# Patient Record
Sex: Male | Born: 1990 | Race: Black or African American | Hispanic: No | Smoking: Current every day smoker
Health system: Southern US, Community
[De-identification: ages and names within clinical notes are randomized; demographics above are authoritative.]

## PROBLEM LIST (undated history)

## (undated) DIAGNOSIS — J45909 Unspecified asthma, uncomplicated: Secondary | ICD-10-CM

## (undated) DIAGNOSIS — F419 Anxiety disorder, unspecified: Secondary | ICD-10-CM

## (undated) DIAGNOSIS — F1910 Other psychoactive substance abuse, uncomplicated: Secondary | ICD-10-CM

## (undated) DIAGNOSIS — R569 Unspecified convulsions: Secondary | ICD-10-CM

---

## 2017-12-29 DIAGNOSIS — Z72820 Sleep deprivation: Secondary | ICD-10-CM | POA: Insufficient documentation

## 2017-12-29 DIAGNOSIS — S299XXA Unspecified injury of thorax, initial encounter: Secondary | ICD-10-CM | POA: Insufficient documentation

## 2017-12-29 DIAGNOSIS — R569 Unspecified convulsions: Secondary | ICD-10-CM | POA: Insufficient documentation

## 2017-12-29 DIAGNOSIS — Z9114 Patient's other noncompliance with medication regimen: Secondary | ICD-10-CM | POA: Insufficient documentation

## 2017-12-29 DIAGNOSIS — S161XXA Strain of muscle, fascia and tendon at neck level, initial encounter: Secondary | ICD-10-CM | POA: Insufficient documentation

## 2017-12-29 DIAGNOSIS — R51 Headache: Secondary | ICD-10-CM | POA: Insufficient documentation

## 2017-12-29 DIAGNOSIS — F419 Anxiety disorder, unspecified: Secondary | ICD-10-CM | POA: Insufficient documentation

## 2017-12-29 DIAGNOSIS — Y929 Unspecified place or not applicable: Secondary | ICD-10-CM | POA: Insufficient documentation

## 2017-12-29 DIAGNOSIS — Z79899 Other long term (current) drug therapy: Secondary | ICD-10-CM | POA: Insufficient documentation

## 2017-12-29 DIAGNOSIS — Y9389 Activity, other specified: Secondary | ICD-10-CM | POA: Insufficient documentation

## 2017-12-29 DIAGNOSIS — W109XXA Fall (on) (from) unspecified stairs and steps, initial encounter: Secondary | ICD-10-CM | POA: Insufficient documentation

## 2017-12-29 DIAGNOSIS — Y999 Unspecified external cause status: Secondary | ICD-10-CM | POA: Insufficient documentation

## 2017-12-30 ENCOUNTER — Other Ambulatory Visit: Payer: Self-pay

## 2017-12-30 ENCOUNTER — Emergency Department (HOSPITAL_COMMUNITY): Payer: Self-pay

## 2017-12-30 ENCOUNTER — Encounter (HOSPITAL_COMMUNITY): Payer: Self-pay | Admitting: Emergency Medicine

## 2017-12-30 ENCOUNTER — Emergency Department (HOSPITAL_COMMUNITY)
Admission: EM | Admit: 2017-12-30 | Discharge: 2017-12-30 | Disposition: A | Discharge status: Home / Self Care | Payer: Self-pay | Attending: Emergency Medicine | Admitting: Emergency Medicine

## 2017-12-30 DIAGNOSIS — R569 Unspecified convulsions: Secondary | ICD-10-CM

## 2017-12-30 DIAGNOSIS — S161XXA Strain of muscle, fascia and tendon at neck level, initial encounter: Secondary | ICD-10-CM

## 2017-12-30 DIAGNOSIS — S298XXA Other specified injuries of thorax, initial encounter: Secondary | ICD-10-CM

## 2017-12-30 HISTORY — DX: Unspecified convulsions: R56.9

## 2017-12-30 LAB — CBC WITH DIFFERENTIAL/PLATELET
Basophils Absolute: 0 10*3/uL (ref 0.0–0.1)
Basophils Relative: 0 %
Eosinophils Absolute: 0.2 10*3/uL (ref 0.0–0.7)
Eosinophils Relative: 2 %
HEMATOCRIT: 38.1 % — AB (ref 39.0–52.0)
Hemoglobin: 12.8 g/dL — ABNORMAL LOW (ref 13.0–17.0)
LYMPHS ABS: 4.1 10*3/uL — AB (ref 0.7–4.0)
LYMPHS PCT: 38 %
MCH: 29.9 pg (ref 26.0–34.0)
MCHC: 33.6 g/dL (ref 30.0–36.0)
MCV: 89 fL (ref 78.0–100.0)
MONOS PCT: 7 %
Monocytes Absolute: 0.8 10*3/uL (ref 0.1–1.0)
NEUTROS ABS: 5.5 10*3/uL (ref 1.7–7.7)
Neutrophils Relative %: 53 %
Platelets: 249 10*3/uL (ref 150–400)
RBC: 4.28 MIL/uL (ref 4.22–5.81)
RDW: 13.6 % (ref 11.5–15.5)
WBC: 10.7 10*3/uL — ABNORMAL HIGH (ref 4.0–10.5)

## 2017-12-30 LAB — COMPREHENSIVE METABOLIC PANEL
ALT: 34 U/L (ref 17–63)
ANION GAP: 9 (ref 5–15)
AST: 29 U/L (ref 15–41)
Albumin: 4.1 g/dL (ref 3.5–5.0)
Alkaline Phosphatase: 65 U/L (ref 38–126)
BUN: 15 mg/dL (ref 6–20)
CO2: 26 mmol/L (ref 22–32)
Calcium: 9.7 mg/dL (ref 8.9–10.3)
Chloride: 102 mmol/L (ref 101–111)
Creatinine, Ser: 1.23 mg/dL (ref 0.61–1.24)
GFR calc Af Amer: >60 mL/min (ref >60)
GFR calc non Af Amer: >60 mL/min (ref >60)
GLUCOSE: 98 mg/dL (ref 65–99)
POTASSIUM: 3.8 mmol/L (ref 3.5–5.1)
SODIUM: 137 mmol/L (ref 135–145)
Total Bilirubin: 0.4 mg/dL (ref 0.3–1.2)
Total Protein: 7.1 g/dL (ref 6.5–8.1)

## 2017-12-30 LAB — ETHANOL: Alcohol, Ethyl (B): <10 mg/dL (ref <10)

## 2017-12-30 IMAGING — CR DG CHEST 2V
2 series · 2 of 2 positions shown · non-contrast
Comparison: None.

CLINICAL DATA: Acute onset of seizures.

EXAM:
CHEST - 2 VIEW

[w chest pa]
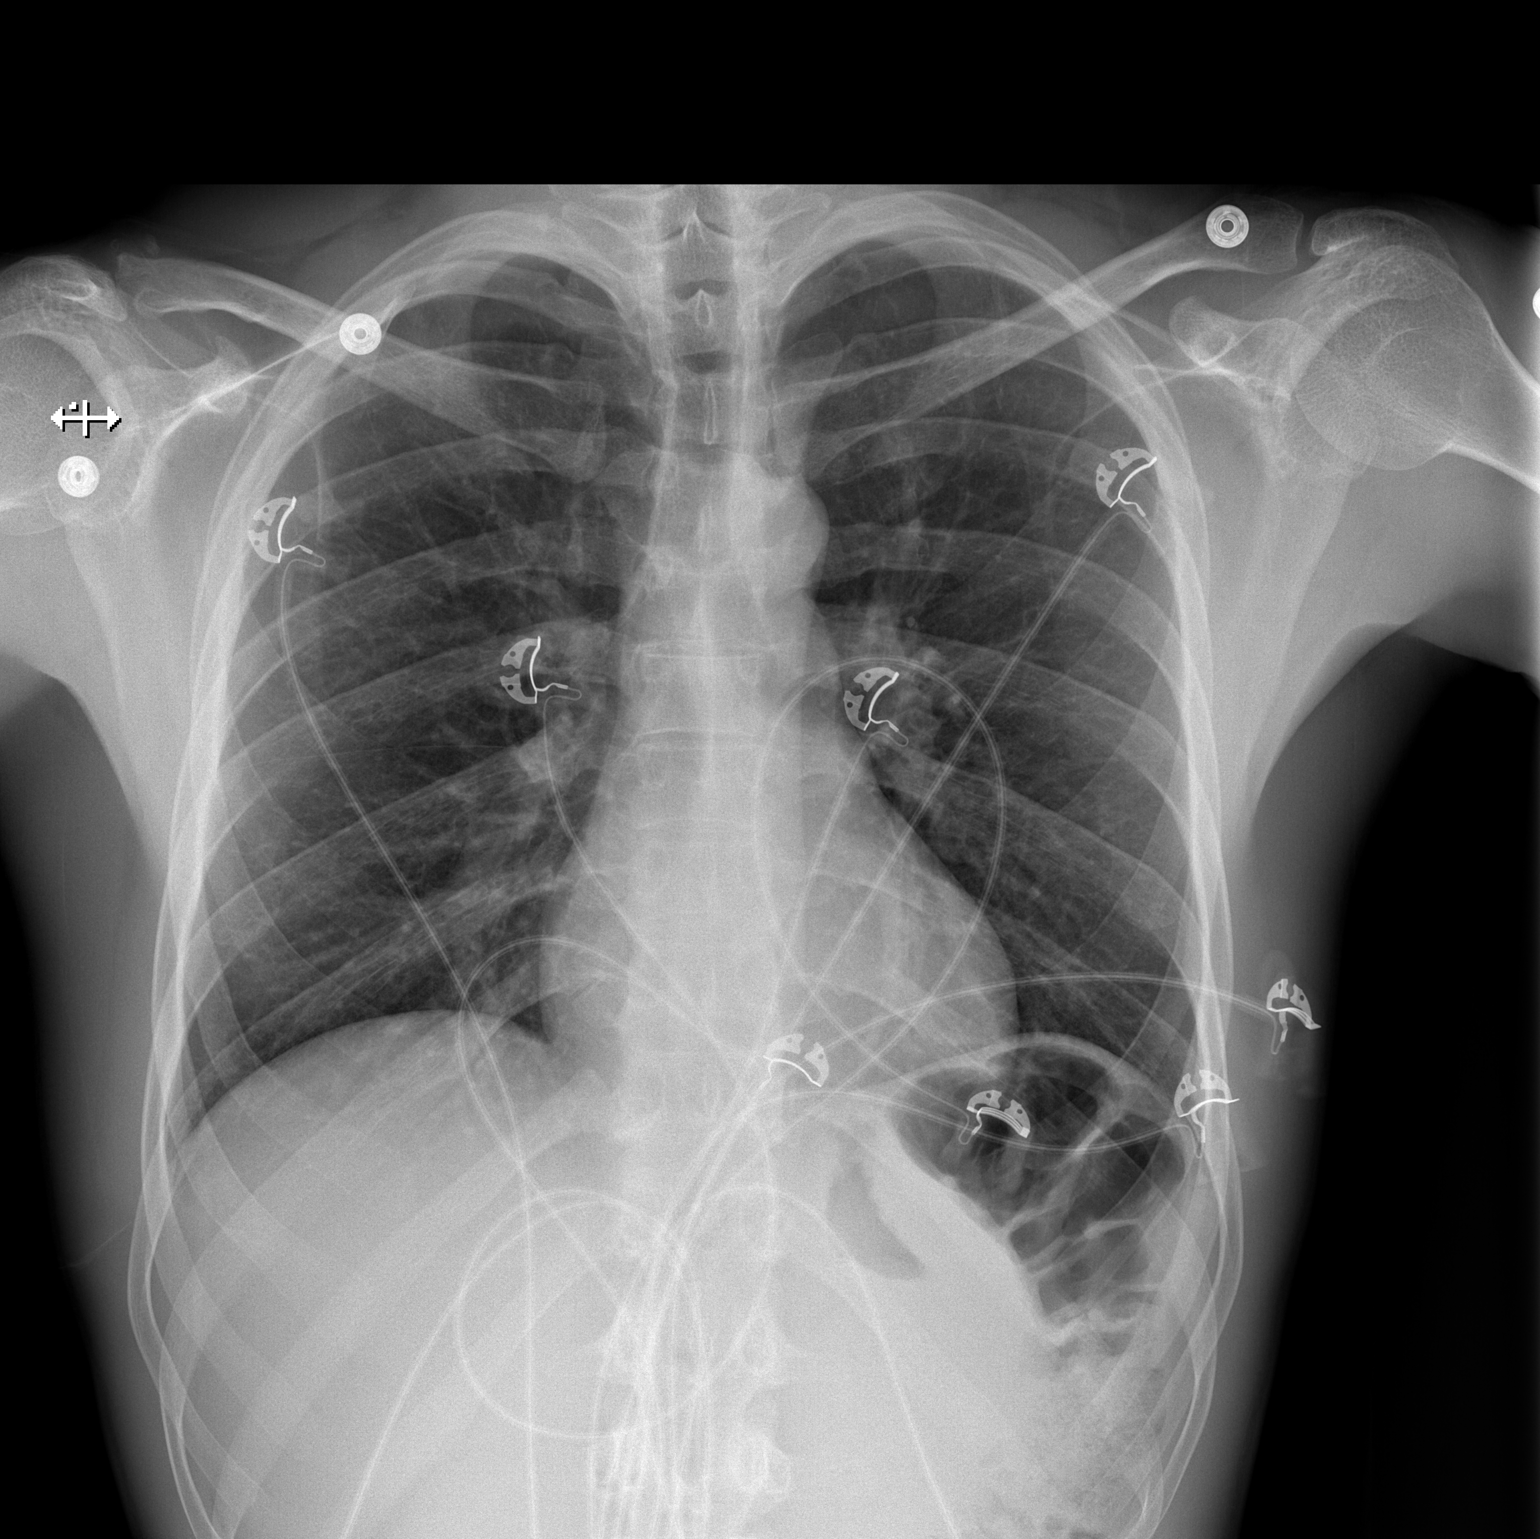

[w chest lat]
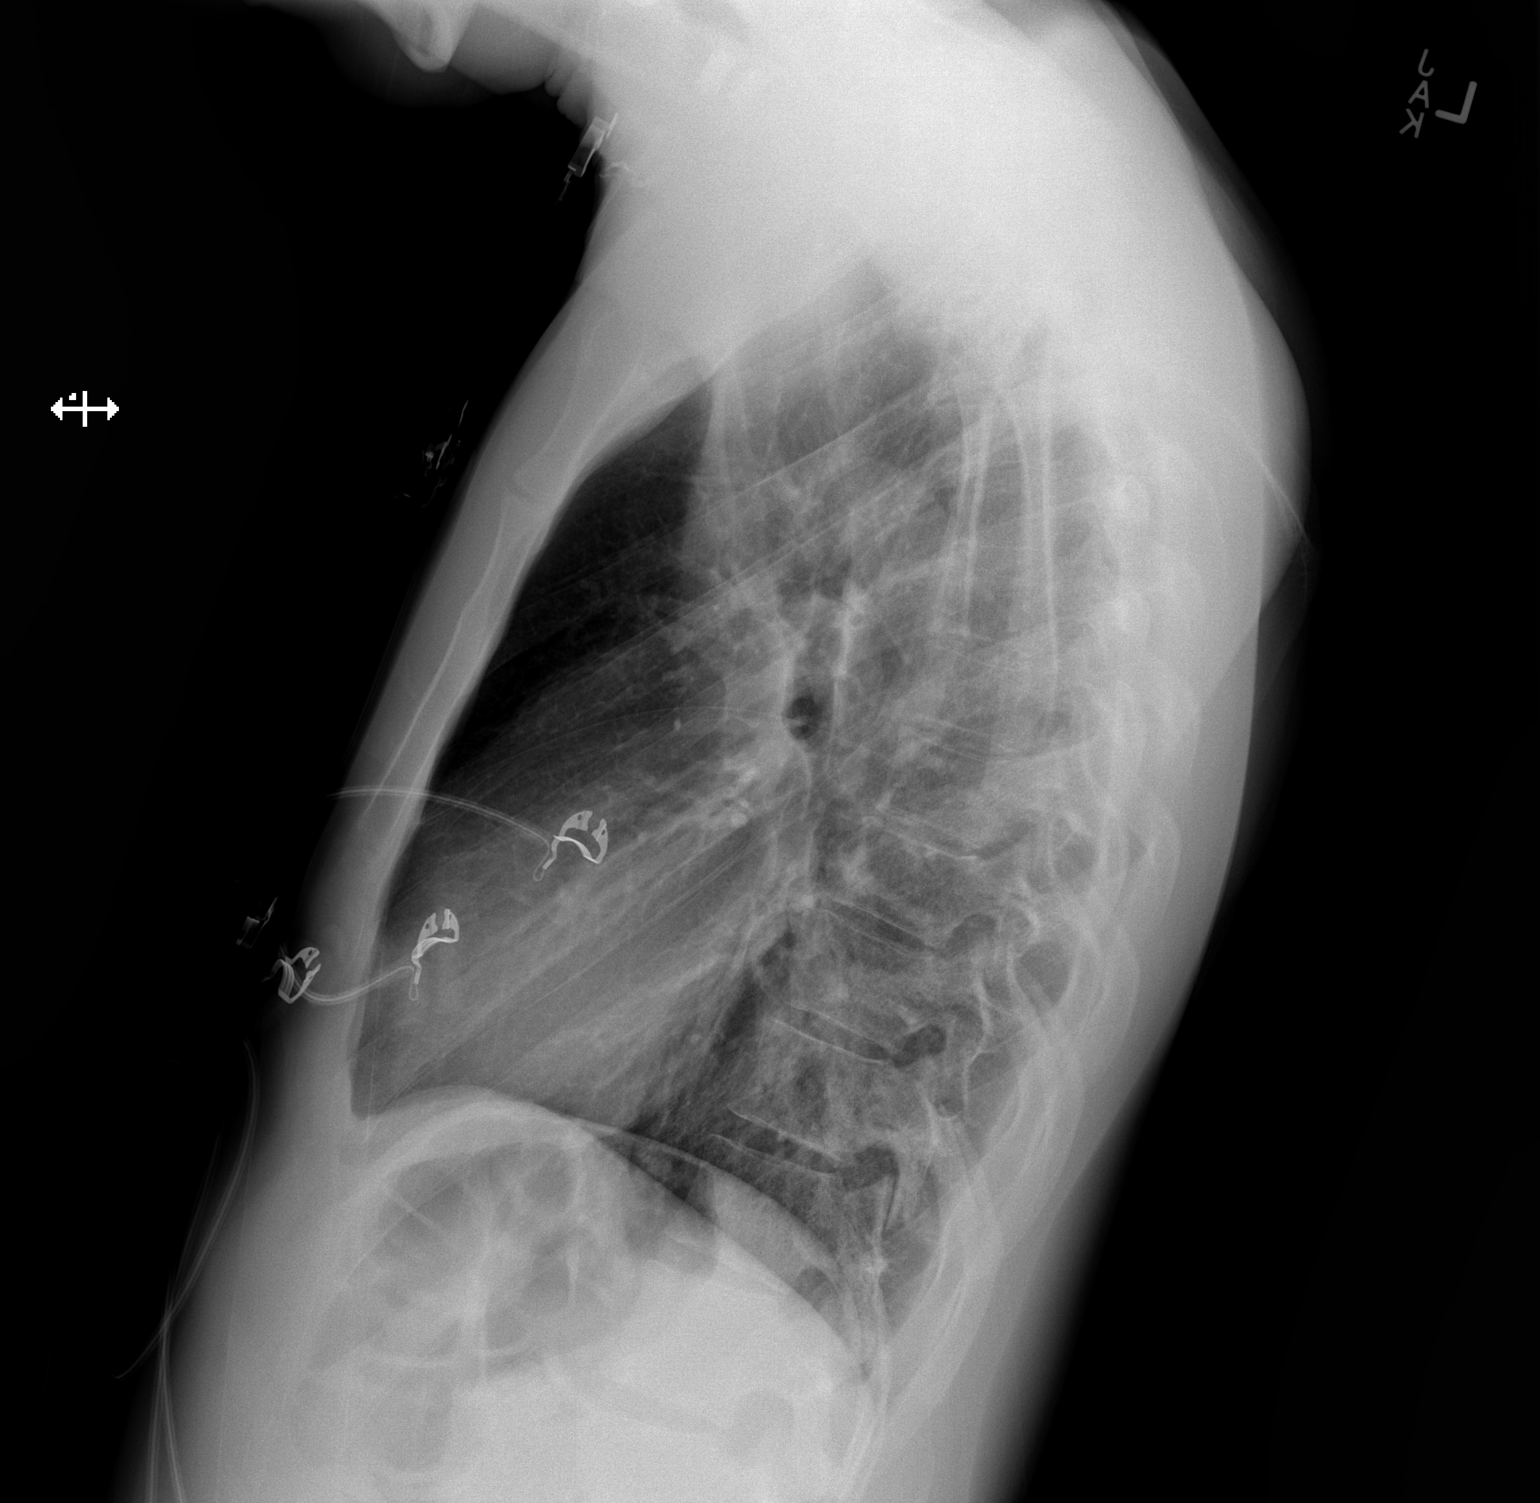

[2 of 2 positions shown; findings below may reference images not displayed]

FINDINGS: The lungs are well-aerated and clear. There is no evidence of focal
opacification, pleural effusion or pneumothorax.

The heart is normal in size; the mediastinal contour is within
normal limits. No acute osseous abnormalities are seen.
IMPRESSION: No acute cardiopulmonary process seen.

## 2017-12-30 IMAGING — CR DG CERVICAL SPINE COMPLETE 4+V
5 series · 5 of 5 positions shown · non-contrast
Comparison: None.

CLINICAL DATA: Status post fall down flight of stairs, with
seizure. Neck pain. Initial encounter.

EXAM:
CERVICAL SPINE - COMPLETE 4+ VIEW

[w cervical spine lat]
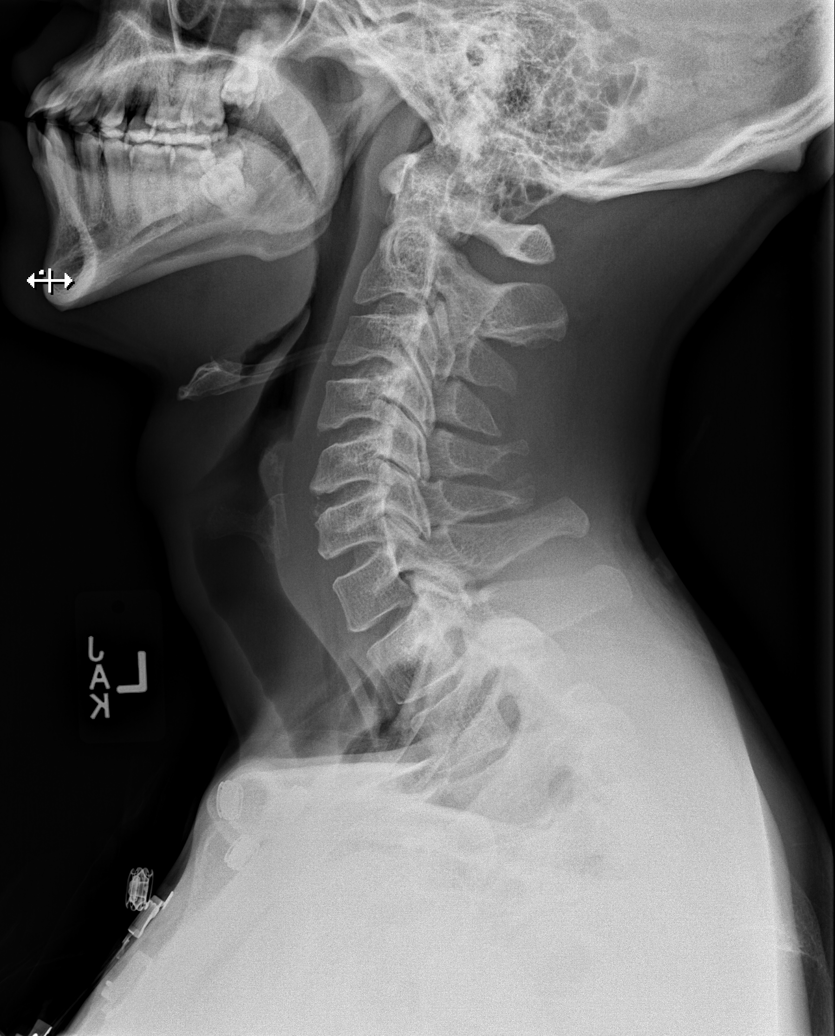

[w cervical spine ap_obl (1 of 2)]
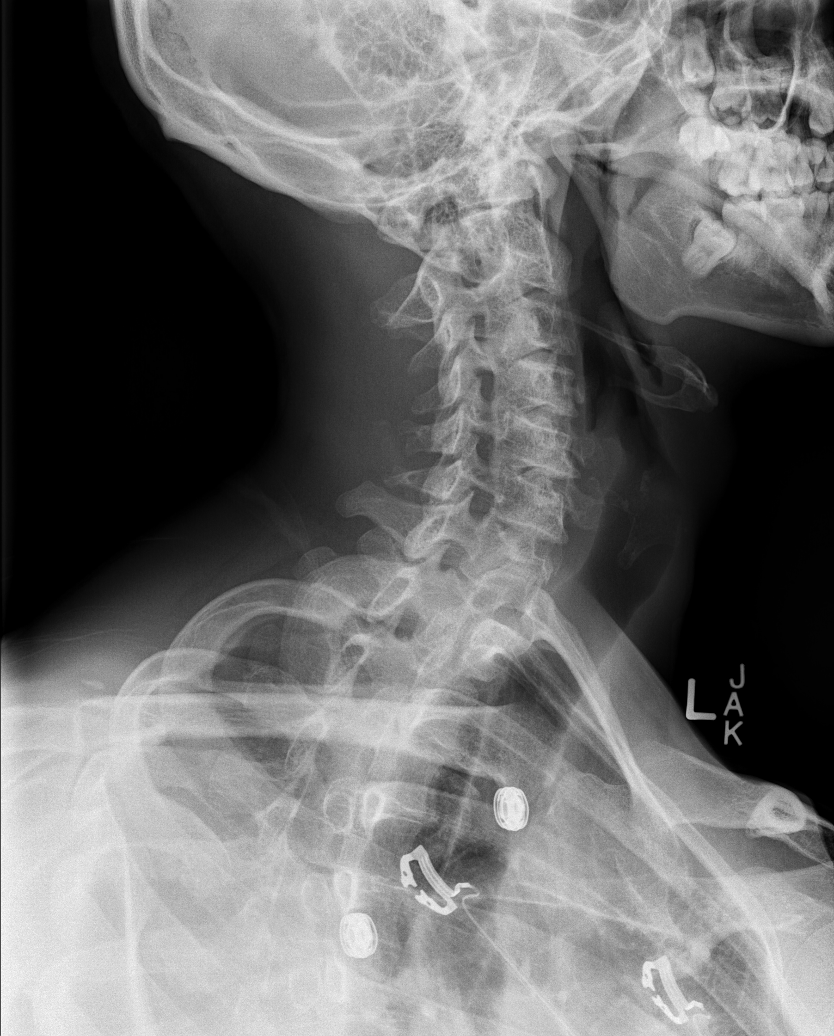

[w cervical spine ap_obl (2 of 2)]
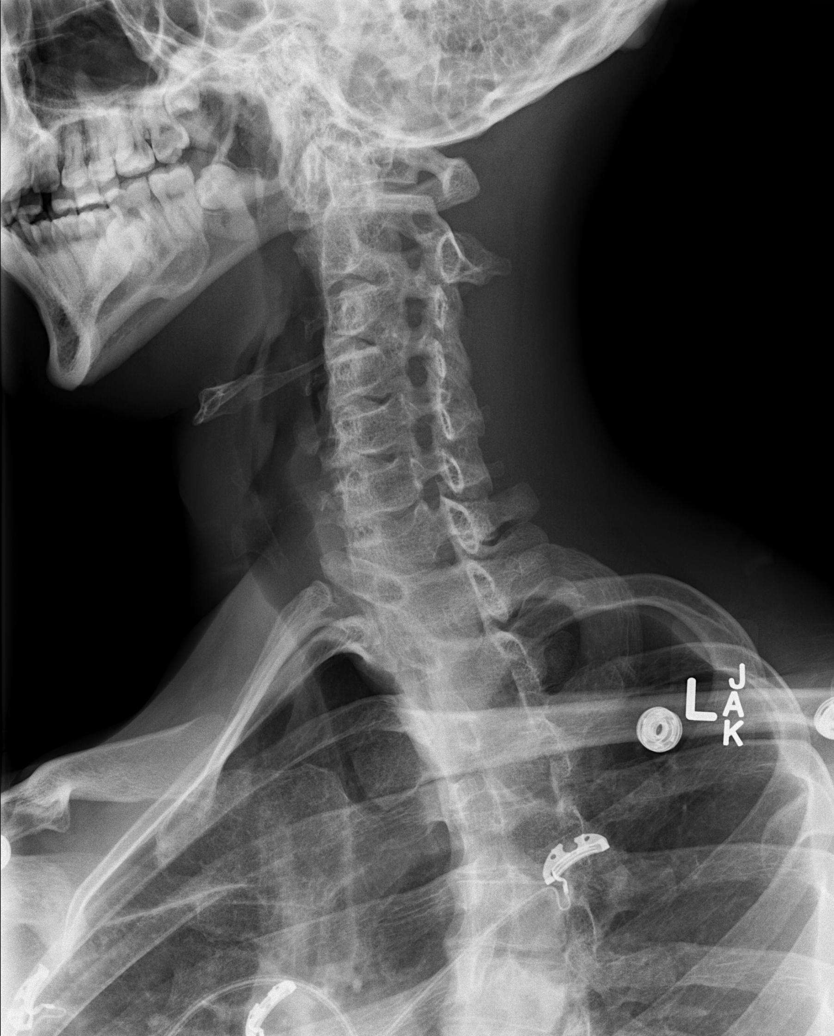

[w cervical spine ap]
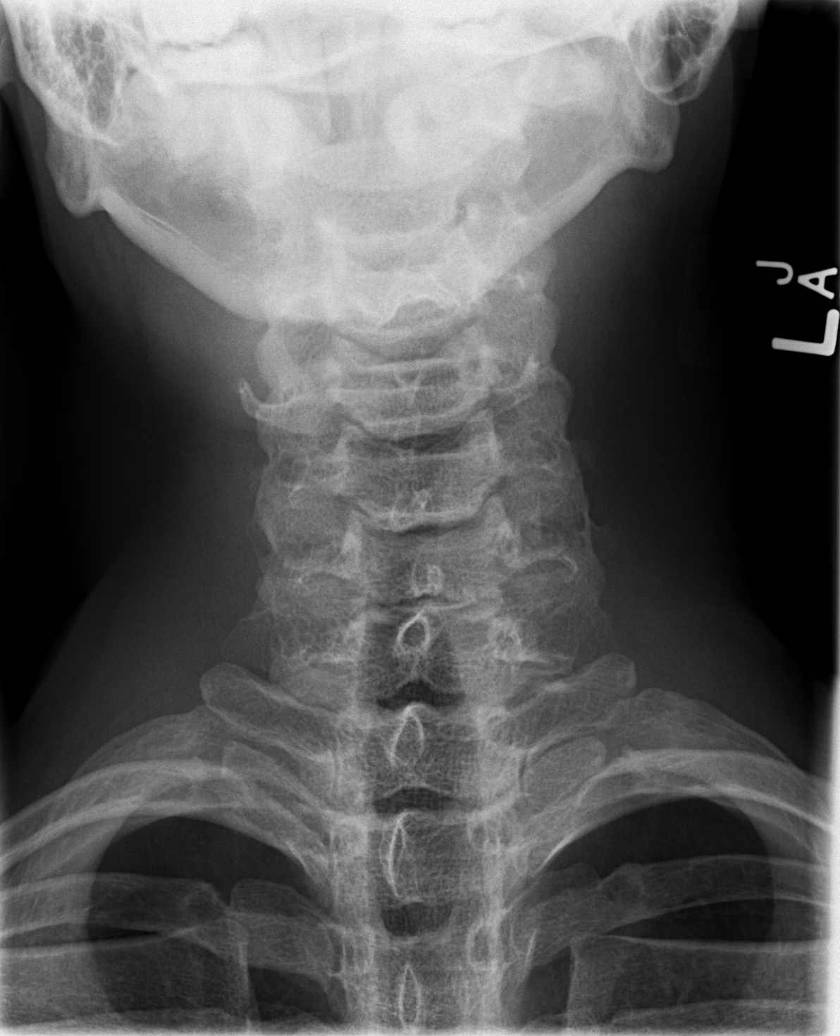

[w cervical spine odontoid]
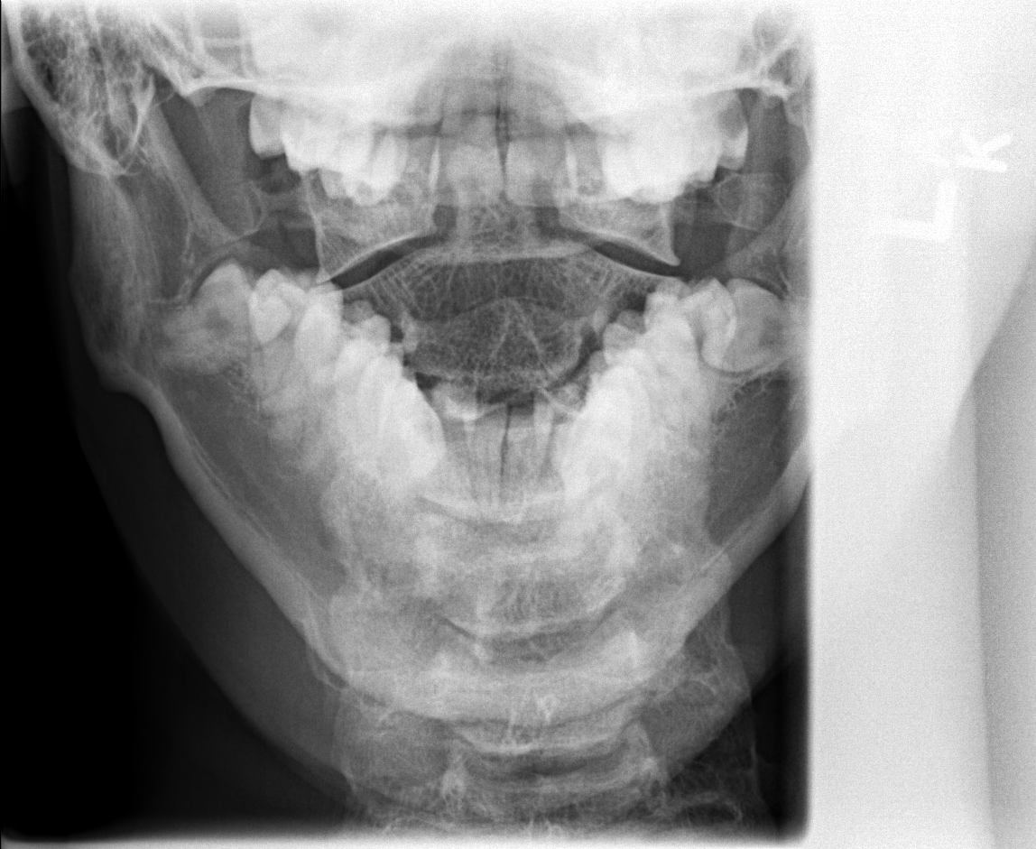

[5 of 5 positions shown; findings below may reference images not displayed]

FINDINGS: There is no evidence of fracture or subluxation. Vertebral bodies
demonstrate normal height and alignment. Intervertebral disc spaces
are preserved. Prevertebral soft tissues are within normal limits.
The provided odontoid view demonstrates no significant abnormality.

The visualized lung apices are clear.
IMPRESSION: No evidence of fracture or subluxation along the cervical spine.

## 2017-12-30 MED ORDER — ACETAMINOPHEN 325 MG PO TABS
650.0000 mg | ORAL_TABLET | Freq: Once | ORAL | Status: AC
  Administered 2017-12-30: 650 mg via ORAL
  Filled 2017-12-30: qty 2

## 2017-12-30 NOTE — ED Triage Notes (Signed)
Patient was brought by his pastor. Patient is very lethargic and is stuttering. Patient was having seizures. Patient has a hx of seizures but has not taken medication in a while. Patient has had no money to get any medication. Patient has not been eating or sleeping well.

## 2017-12-30 NOTE — ED Notes (Signed)
Pt made an unsuccessful attempt to provide urine specimen. 

## 2017-12-30 NOTE — Discharge Instructions (Signed)
Please be aware you may have another seizure ° °Do not drive until seen by your physician for your condition ° °Do not climb ladders/roofs/trees as a seizure can occur at that height and cause serious harm ° °Do not bathe/swim alone as a seizure can occur and cause serious harm ° °Please followup with your physician or neurologist for further testing and possible treatment ° ° °

## 2017-12-30 NOTE — ED Provider Notes (Signed)
[facility] Provider Note   CSN: [id number] Arrival date & time: 12/29/17  2340     History   Chief Complaint Chief Complaint  Patient presents with  . Seizures    HPI [facility] is a 27 y.o. male.  The history is provided by the patient.  Seizures   This is a recurrent problem. There were 2 to 3 seizures. Associated symptoms include confusion and headaches. The seizure(s) had no focality. There has been no fever.   Patient with known history of seizures presents today with 2 seizures in the past 12  hours. Patient reports that bystanders told him he had a seizure and fell downstairs.  He reports that time is been having some headache/neck pain and chest wall pain.  He had another seizure later on that resolved spontaneously.  He reports he supposed to be taking Keppra, but has not taken it in a while.  He is followed up with neurology in [place] [facility] previously  He reports he is under a lot of stress with multiple jobs, and his mother is having health problems.  He reports lack of sleep. Denies any drug or alcohol abuse Past Medical History:  Diagnosis Date  . Seizures (HCC)     There are no active problems to display for this patient.   History reviewed. No pertinent surgical history.      Home Medications    Prior to Admission medications   Medication Sig Start Date End Date Taking? Authorizing Provider  clonazePAM (KLONOPIN PO) Take by mouth.   Yes [provider]    Family History History reviewed. No pertinent family history.  Social History Social History   Tobacco Use  . Smoking status: Never Smoker  . Smokeless tobacco: Never Used  Substance Use Topics  . Alcohol use: Never    Frequency: Never  . Drug use: Never     Allergies   Ibuprofen and Other   Review of Systems Review of Systems  Constitutional: Negative for fever.  Musculoskeletal: Positive for neck pain.  Neurological: Positive for  seizures and headaches.  Psychiatric/Behavioral: Positive for confusion and sleep disturbance. The patient is nervous/anxious.   All other systems reviewed and are negative.    Physical Exam Updated Vital Signs BP 120/87 (BP Location: Right Arm)   Pulse 87   Temp 98.3 F (36.8 C) (Oral)   Resp (!) 29   Ht 1.778 m (5\' 10" )   Wt 68 kg (150 lb)   SpO2 100%   BMI 21.52 kg/m   Physical Exam CONSTITUTIONAL: Well developed/well nourished, anxious HEAD: Normocephalic/atraumatic EYES: EOMI/PERRL ENMT: Mucous membranes moist no signs of trauma NECK: supple no meningeal signs SPINE/BACK: Diffuse cervical spine tenderness, no bruising/crepitance/stepoffs noted to spine No bruising to T or L-spine CV: S1/S2 noted, no murmurs/rubs/gallops noted Chest-diffuse chest wall tenderness, no crepitus LUNGS: Lungs are clear to auscultation bilaterally, no apparent distress ABDOMEN: soft, nontender, no rebound or guarding, bowel sounds noted throughout abdomen GU:no cva tenderness NEURO: Pt is awake/alert/appropriate, moves all extremitiesx4.  No facial droop.  GCS is 15.  Patient is ambulatory.  No focal weakness noted EXTREMITIES: pulses normal/equal, full ROM,All other extremities/joints palpated/ranged and nontender SKIN: warm, color normal PSYCH: Anxious  ED Treatments / Results  Labs (all labs ordered are listed, but only abnormal results are displayed) Labs Reviewed  CBC WITH DIFFERENTIAL/PLATELET - Abnormal; Notable for the following components:      Result Value   WBC 10.7 (*)  Hemoglobin 12.8 (*)    HCT 38.1 (*)    Lymphs Abs 4.1 (*)    All other components within normal limits  COMPREHENSIVE METABOLIC PANEL  ETHANOL    EKG EKG Interpretation  Date/Time:  Sunday December 30 2017 02:03:14 EDT Ventricular Rate:  75 PR Interval:    QRS Duration: 85 QT Interval:  377 QTC Calculation: 421 R Axis:   -81 Text Interpretation:  Sinus rhythm LAE, consider biatrial enlargement Left  axis deviation RSR' in V1 or V2, probably normal variant ST elev, probable normal early repol pattern No previous ECGs available Abnormal ekg Confirmed by Zadie RhineWickline, Ulonda Klosowski ([id number]) on 12/30/2017 2:13:52 AM   Radiology Dg Chest 2 View  Result Date: 12/30/2017 CLINICAL DATA:  Acute onset of seizures. EXAM: CHEST - 2 VIEW COMPARISON:  None. FINDINGS: The lungs are well-aerated and clear. There is no evidence of focal opacification, pleural effusion or pneumothorax. The heart is normal in size; the mediastinal contour is within normal limits. No acute osseous abnormalities are seen. IMPRESSION: No acute cardiopulmonary process seen. Electronically Signed   By: Roanna RaiderJeffery  Chang M.D.   On: 12/30/2017 04:01   Dg Cervical Spine Complete  Result Date: 12/30/2017 CLINICAL DATA:  Status post fall down flight of stairs, with seizure. Neck pain. Initial encounter. EXAM: CERVICAL SPINE - COMPLETE 4+ VIEW COMPARISON:  None. FINDINGS: There is no evidence of fracture or subluxation. Vertebral bodies demonstrate normal height and alignment. Intervertebral disc spaces are preserved. Prevertebral soft tissues are within normal limits. The provided odontoid view demonstrates no significant abnormality. The visualized lung apices are clear. IMPRESSION: No evidence of fracture or subluxation along the cervical spine. Electronically Signed   By: Roanna RaiderJeffery  Chang M.D.   On: 12/30/2017 04:01    Procedures Procedures   Medications Ordered in ED Medications  acetaminophen (TYLENOL) tablet 650 mg (650 mg Oral Given 12/30/17 0303)     Initial Impression / Assessment and Plan / ED Course  I have reviewed the triage vital signs and the nursing notes.  Pertinent labs & imaging results that were available during my care of the patient were reviewed by me and considered in my medical decision making (see chart for details).     Patient with History of seizures, reports he had 2 seizures the previous day.  Is back to baseline.  No  neuro deficits. No acute traumatic injury from seizure.  No signs of head injury.  GCS 15.  Will refer to neurology.  He reports he has used Keppra previously, but did have side effects from it.  He will need to have a full seizure evaluation by neurology prior to starting any new meds   Awake alert, no distress.  He is ambulatory .  Discussed seizure precautions Final Clinical Impressions(s) / ED Diagnoses   Final diagnoses:  Seizure (HCC)  Blunt trauma to chest, initial encounter  Strain of neck muscle, initial encounter    ED Discharge Orders    None       Zadie RhineWickline, Cloie Wooden, MD 12/30/17 [phone]

## 2017-12-31 LAB — CBG MONITORING, ED: Glucose-Capillary: 93 mg/dL (ref 65–99)

## 2019-06-14 ENCOUNTER — Other Ambulatory Visit: Payer: Self-pay

## 2019-06-14 ENCOUNTER — Emergency Department (HOSPITAL_COMMUNITY): Payer: No Typology Code available for payment source

## 2019-06-14 ENCOUNTER — Emergency Department (HOSPITAL_COMMUNITY)
Admission: EM | Admit: 2019-06-14 | Discharge: 2019-06-14 | Disposition: A | Discharge status: Home / Self Care | Payer: No Typology Code available for payment source | Attending: Emergency Medicine | Admitting: Emergency Medicine

## 2019-06-14 DIAGNOSIS — R0789 Other chest pain: Secondary | ICD-10-CM | POA: Diagnosis not present

## 2019-06-14 DIAGNOSIS — R109 Unspecified abdominal pain: Secondary | ICD-10-CM | POA: Insufficient documentation

## 2019-06-14 LAB — COMPREHENSIVE METABOLIC PANEL
ALT: 28 U/L (ref 0–44)
AST: 29 U/L (ref 15–41)
Albumin: 3.7 g/dL (ref 3.5–5.0)
Alkaline Phosphatase: 78 U/L (ref 38–126)
Anion gap: 8 (ref 5–15)
BUN: 10 mg/dL (ref 6–20)
CO2: 26 mmol/L (ref 22–32)
Calcium: 9.2 mg/dL (ref 8.9–10.3)
Chloride: 102 mmol/L (ref 98–111)
Creatinine, Ser: 1.13 mg/dL (ref 0.61–1.24)
GFR calc Af Amer: >60 mL/min (ref >60)
GFR calc non Af Amer: >60 mL/min (ref >60)
Glucose, Bld: 96 mg/dL (ref 70–99)
Potassium: 3.6 mmol/L (ref 3.5–5.1)
Sodium: 136 mmol/L (ref 135–145)
Total Bilirubin: 0.5 mg/dL (ref 0.3–1.2)
Total Protein: 6.7 g/dL (ref 6.5–8.1)

## 2019-06-14 LAB — CBC
HCT: 39.4 % (ref 39.0–52.0)
Hemoglobin: 13.1 g/dL (ref 13.0–17.0)
MCH: 29.3 pg (ref 26.0–34.0)
MCHC: 33.2 g/dL (ref 30.0–36.0)
MCV: 88.1 fL (ref 80.0–100.0)
Platelets: 305 10*3/uL (ref 150–400)
RBC: 4.47 MIL/uL (ref 4.22–5.81)
RDW: 12.7 % (ref 11.5–15.5)
WBC: 7.9 10*3/uL (ref 4.0–10.5)
nRBC: 0 % (ref 0.0–0.2)

## 2019-06-14 LAB — I-STAT CHEM 8, ED
BUN: 12 mg/dL (ref 6–20)
Calcium, Ion: 1.14 mmol/L — ABNORMAL LOW (ref 1.15–1.40)
Chloride: 101 mmol/L (ref 98–111)
Creatinine, Ser: 1.1 mg/dL (ref 0.61–1.24)
Glucose, Bld: 92 mg/dL (ref 70–99)
HCT: 40 % (ref 39.0–52.0)
Hemoglobin: 13.6 g/dL (ref 13.0–17.0)
Potassium: 3.6 mmol/L (ref 3.5–5.1)
Sodium: 141 mmol/L (ref 135–145)
TCO2: 25 mmol/L (ref 22–32)

## 2019-06-14 LAB — PROTIME-INR
INR: 0.9 (ref 0.8–1.2)
Prothrombin Time: 12.1 seconds (ref 11.4–15.2)

## 2019-06-14 LAB — SAMPLE TO BLOOD BANK

## 2019-06-14 LAB — LACTIC ACID, PLASMA: Lactic Acid, Venous: 1.8 mmol/L (ref 0.5–1.9)

## 2019-06-14 LAB — ETHANOL: Alcohol, Ethyl (B): <10 mg/dL (ref <10)

## 2019-06-14 LAB — CDS SEROLOGY

## 2019-06-14 MED ORDER — ACETAMINOPHEN 325 MG PO TABS
650.0000 mg | ORAL_TABLET | Freq: Once | ORAL | Status: AC
  Administered 2019-06-14: 650 mg via ORAL
  Filled 2019-06-14: qty 2

## 2019-06-14 MED ORDER — ONDANSETRON HCL 4 MG/2ML IJ SOLN
4.0000 mg | Freq: Once | INTRAMUSCULAR | Status: AC
  Administered 2019-06-14: 4 mg via INTRAVENOUS
  Filled 2019-06-14: qty 2

## 2019-06-14 MED ORDER — IOHEXOL 300 MG/ML  SOLN
100.0000 mL | Freq: Once | INTRAMUSCULAR | Status: AC | PRN
  Administered 2019-06-14: 100 mL via INTRAVENOUS

## 2019-06-14 NOTE — Progress Notes (Signed)
Orthopedic Tech Progress Note Patient Details:  Stephen [facility] 07/31/1877 [id number]  Patient ID: Stephen [facility], male   DOB: 07/31/1877, 28 y.o.   MRN: [id number]   [place] Pink 06/14/2019, 1:13 PMLEVEL 2 TRAUMA.
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
## 2019-06-14 NOTE — ED Notes (Signed)
Patient transported to CT 
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
## 2019-08-10 ENCOUNTER — Emergency Department (HOSPITAL_COMMUNITY)
Admission: EM | Admit: 2019-08-10 | Discharge: 2019-08-11 | Disposition: A | Discharge status: Home / Self Care | Payer: HRSA Program | Attending: Emergency Medicine | Admitting: Emergency Medicine

## 2019-08-10 ENCOUNTER — Emergency Department (HOSPITAL_COMMUNITY): Payer: HRSA Program

## 2019-08-10 ENCOUNTER — Other Ambulatory Visit: Payer: Self-pay

## 2019-08-10 ENCOUNTER — Encounter (HOSPITAL_COMMUNITY): Payer: Self-pay | Admitting: Emergency Medicine

## 2019-08-10 DIAGNOSIS — Z20822 Contact with and (suspected) exposure to covid-19: Secondary | ICD-10-CM

## 2019-08-10 DIAGNOSIS — Z79899 Other long term (current) drug therapy: Secondary | ICD-10-CM | POA: Diagnosis not present

## 2019-08-10 DIAGNOSIS — R112 Nausea with vomiting, unspecified: Secondary | ICD-10-CM

## 2019-08-10 DIAGNOSIS — U071 COVID-19: Secondary | ICD-10-CM | POA: Insufficient documentation

## 2019-08-10 DIAGNOSIS — R197 Diarrhea, unspecified: Secondary | ICD-10-CM

## 2019-08-10 LAB — CBC WITH DIFFERENTIAL/PLATELET
Abs Immature Granulocytes: 0.05 10*3/uL (ref 0.00–0.07)
Basophils Absolute: 0 10*3/uL (ref 0.0–0.1)
Basophils Relative: 0 %
Eosinophils Absolute: 0 10*3/uL (ref 0.0–0.5)
Eosinophils Relative: 0 %
HCT: 43.9 % (ref 39.0–52.0)
Hemoglobin: 15.2 g/dL (ref 13.0–17.0)
Immature Granulocytes: 1 %
Lymphocytes Relative: 15 %
Lymphs Abs: 1.5 10*3/uL (ref 0.7–4.0)
MCH: 29.7 pg (ref 26.0–34.0)
MCHC: 34.6 g/dL (ref 30.0–36.0)
MCV: 85.9 fL (ref 80.0–100.0)
Monocytes Absolute: 0.6 10*3/uL (ref 0.1–1.0)
Monocytes Relative: 6 %
Neutro Abs: 8 10*3/uL — ABNORMAL HIGH (ref 1.7–7.7)
Neutrophils Relative %: 78 %
Platelets: 332 10*3/uL (ref 150–400)
RBC: 5.11 MIL/uL (ref 4.22–5.81)
RDW: 12.9 % (ref 11.5–15.5)
WBC: 10.2 10*3/uL (ref 4.0–10.5)
nRBC: 0 % (ref 0.0–0.2)

## 2019-08-10 LAB — BASIC METABOLIC PANEL
Anion gap: 14 (ref 5–15)
BUN: 20 mg/dL (ref 6–20)
CO2: 21 mmol/L — ABNORMAL LOW (ref 22–32)
Calcium: 10 mg/dL (ref 8.9–10.3)
Chloride: 103 mmol/L (ref 98–111)
Creatinine, Ser: 1.15 mg/dL (ref 0.61–1.24)
GFR calc Af Amer: >60 mL/min (ref >60)
GFR calc non Af Amer: >60 mL/min (ref >60)
Glucose, Bld: 136 mg/dL — ABNORMAL HIGH (ref 70–99)
Potassium: 3.6 mmol/L (ref 3.5–5.1)
Sodium: 138 mmol/L (ref 135–145)

## 2019-08-10 IMAGING — DX DG CHEST 1V PORT
1 series · 1 of 1 positions shown · non-contrast
Comparison: [DATE]

CLINICAL DATA: [ND]

EXAM:
PORTABLE CHEST 1 VIEW

[chest]
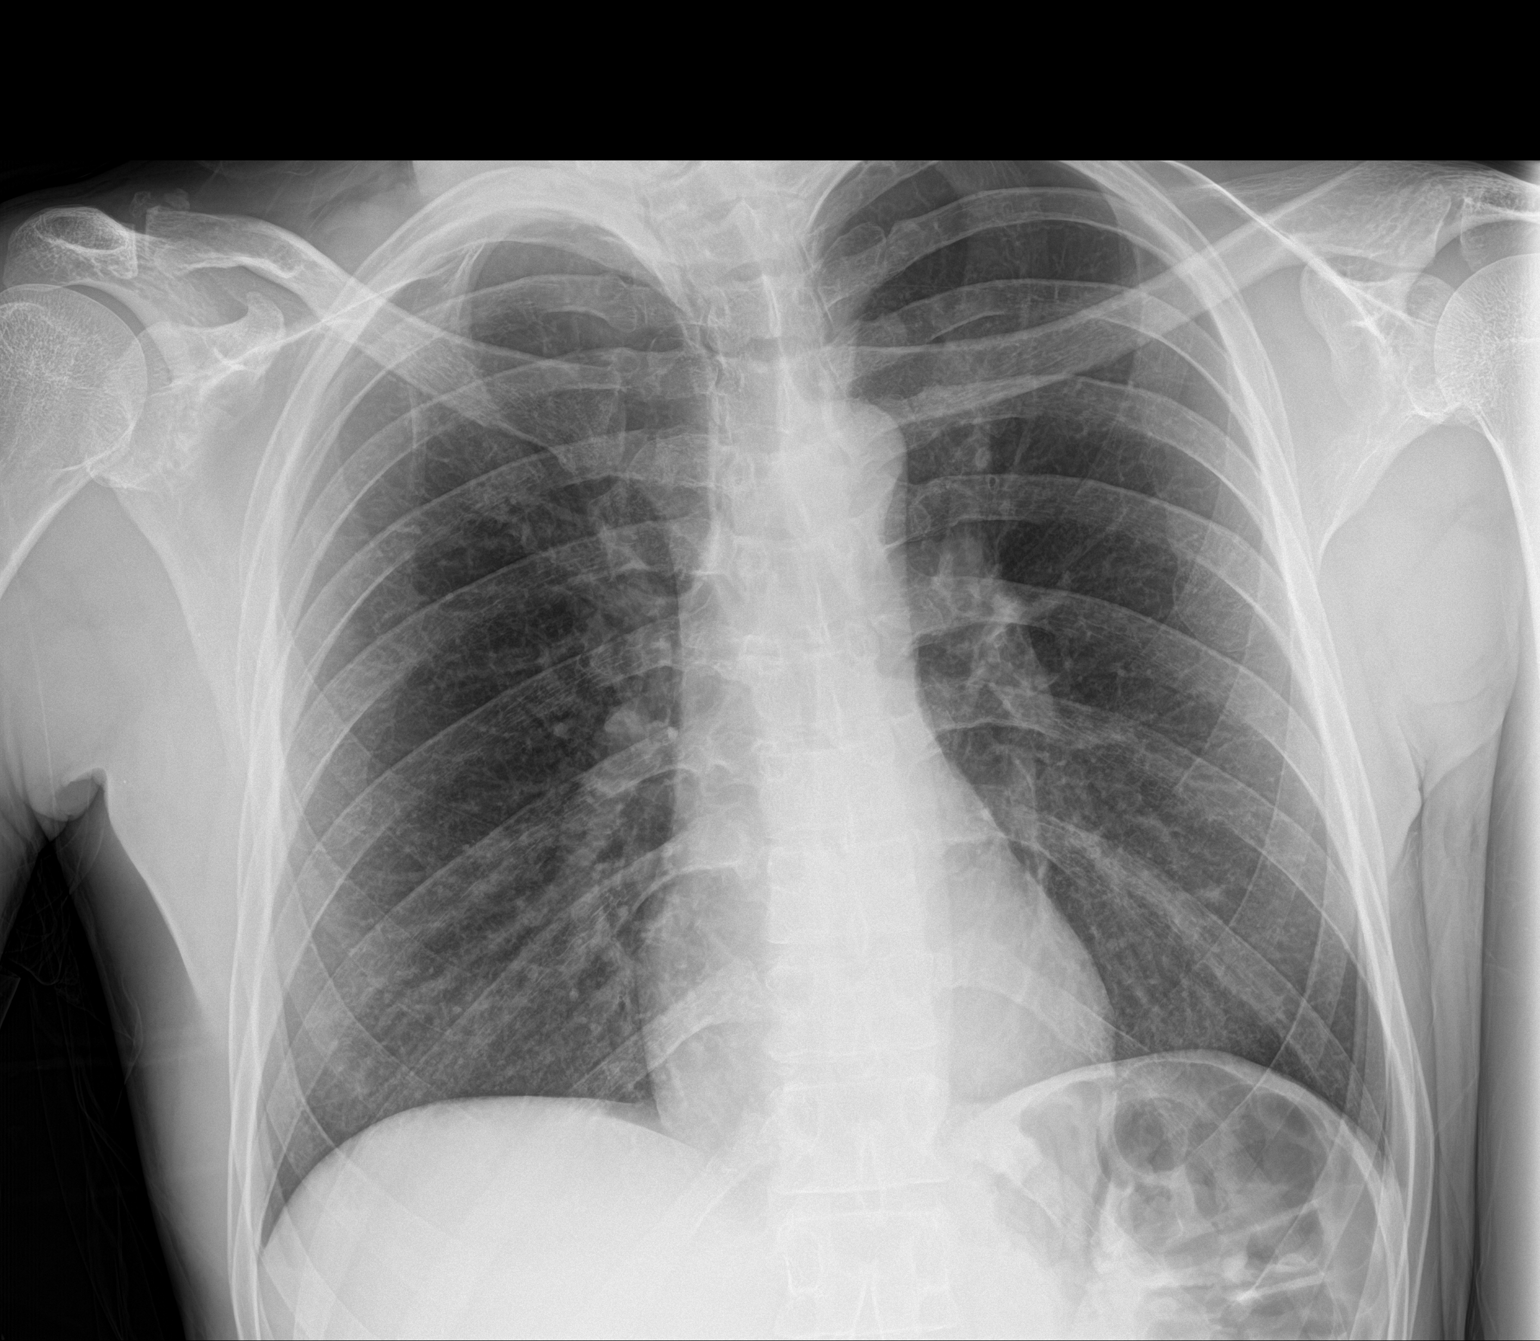

[1 of 1 positions shown; findings below may reference images not displayed]

FINDINGS: The heart size and mediastinal contours are within normal limits.
Both lungs are clear. The visualized skeletal structures are
unremarkable.
IMPRESSION: No active disease.
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
## 2019-08-11 ENCOUNTER — Encounter (HOSPITAL_COMMUNITY): Payer: Self-pay | Admitting: Emergency Medicine

## 2019-08-11 LAB — SARS CORONAVIRUS 2 (TAT 6-24 HRS): SARS Coronavirus 2: POSITIVE — AB

## 2019-08-11 LAB — POC SARS CORONAVIRUS 2 AG -  ED: SARS Coronavirus 2 Ag: NEGATIVE

## 2019-08-11 MED ORDER — ONDANSETRON 4 MG PO TBDP
8.0000 mg | ORAL_TABLET | Freq: Once | ORAL | Status: AC
  Administered 2019-08-11: 03:00:00 8 mg via ORAL
  Filled 2019-08-11: qty 2

## 2019-08-11 MED ORDER — ACETAMINOPHEN 500 MG PO TABS
1000.0000 mg | ORAL_TABLET | Freq: Once | ORAL | Status: AC
  Administered 2019-08-11: 05:00:00 1000 mg via ORAL
  Filled 2019-08-11: qty 2

## 2019-08-11 NOTE — ED Provider Notes (Addendum)
[facility] EMERGENCY DEPARTMENT Provider Note   CSN: [id number] Arrival date & time: 08/10/19  2036     History Chief Complaint  Patient presents with  . Covid+ Exposure / Fever/Chills/Emesis    Stephen Shepard is a 29 y.o. male.  The history is provided by the patient.  Emesis Severity:  Moderate Duration:  1 day Timing:  Sporadic Quality:  Stomach contents Able to tolerate:  Liquids Progression:  Unchanged Chronicity:  New Recent urination:  Normal Relieved by:  Nothing Worsened by:  Nothing Associated symptoms: cough, diarrhea, fever and myalgias   Associated symptoms: no abdominal pain   Associated symptoms comment:  Exposed  To covid Risk factors: sick contacts        Past Medical History:  Diagnosis Date  . Seizures (HCC)     There are no problems to display for this patient.   History reviewed. No pertinent surgical history.     History reviewed. No pertinent family history.  Social History   Tobacco Use  . Smoking status: Never Smoker  . Smokeless tobacco: Never Used  Substance Use Topics  . Alcohol use: Never  . Drug use: Never    Home Medications Prior to Admission medications   Medication Sig Start Date End Date Taking? Authorizing Provider  clonazePAM (KLONOPIN PO) Take by mouth.    [provider]    Allergies    Ibuprofen and Other  Review of Systems   Review of Systems  Constitutional: Positive for fever.  HENT: Negative for congestion.   Eyes: Negative for visual disturbance.  Respiratory: Positive for cough. Negative for shortness of breath.   Cardiovascular: Negative for chest pain, palpitations and leg swelling.  Gastrointestinal: Positive for diarrhea and vomiting. Negative for abdominal pain.  Genitourinary: Negative for difficulty urinating.  Musculoskeletal: Positive for myalgias.  Neurological: Negative for syncope.  Psychiatric/Behavioral: Negative for agitation.  All other systems  reviewed and are negative.   Physical Exam Updated Vital Signs BP 137/85 (BP Location: Right Arm)   Pulse 75   Temp 100.1 F (37.8 C) (Oral)   Resp 18   SpO2 98%   Physical Exam Vitals and nursing note reviewed.  Constitutional:      General: He is not in acute distress.    Appearance: Normal appearance.     Comments: Preferring to sleep pulling covers over his head  HENT:     Head: Normocephalic and atraumatic.     Nose: Nose normal.  Eyes:     Conjunctiva/sclera: Conjunctivae normal.     Pupils: Pupils are equal, round, and reactive to light.  Cardiovascular:     Rate and Rhythm: Normal rate and regular rhythm.     Pulses: Normal pulses.     Heart sounds: Normal heart sounds.  Pulmonary:     Effort: Pulmonary effort is normal.     Breath sounds: Normal breath sounds.  Abdominal:     General: Abdomen is flat. Bowel sounds are normal.     Tenderness: There is no abdominal tenderness. There is no guarding or rebound.  Musculoskeletal:        General: Normal range of motion.     Cervical back: Normal range of motion and neck supple.  Skin:    General: Skin is warm and dry.     Capillary Refill: Capillary refill takes less than 2 seconds.  Neurological:     General: No focal deficit present.     Mental Status: He is alert and oriented  to person, place, and time.     Deep Tendon Reflexes: Reflexes normal.  Psychiatric:        Mood and Affect: Mood normal.        Behavior: Behavior normal.     ED Results / Procedures / Treatments   Labs (all labs ordered are listed, but only abnormal results are displayed) Results for orders placed or performed during the hospital encounter of 08/10/19  CBC with Differential  Result Value Ref Range   WBC 10.2 4.0 - 10.5 K/uL   RBC 5.11 4.22 - 5.81 MIL/uL   Hemoglobin 15.2 13.0 - 17.0 g/dL   HCT 43.9 39.0 - 52.0 %   MCV 85.9 80.0 - 100.0 fL   MCH 29.7 26.0 - 34.0 pg   MCHC 34.6 30.0 - 36.0 g/dL   RDW 12.9 11.5 - 15.5 %    Platelets 332 150 - 400 K/uL   nRBC 0.0 0.0 - 0.2 %   Neutrophils Relative % 78 %   Neutro Abs 8.0 (H) 1.7 - 7.7 K/uL   Lymphocytes Relative 15 %   Lymphs Abs 1.5 0.7 - 4.0 K/uL   Monocytes Relative 6 %   Monocytes Absolute 0.6 0.1 - 1.0 K/uL   Eosinophils Relative 0 %   Eosinophils Absolute 0.0 0.0 - 0.5 K/uL   Basophils Relative 0 %   Basophils Absolute 0.0 0.0 - 0.1 K/uL   Immature Granulocytes 1 %   Abs Immature Granulocytes 0.05 0.00 - 0.07 K/uL  Basic metabolic panel  Result Value Ref Range   Sodium 138 135 - 145 mmol/L   Potassium 3.6 3.5 - 5.1 mmol/L   Chloride 103 98 - 111 mmol/L   CO2 21 (L) 22 - 32 mmol/L   Glucose, Bld 136 (H) 70 - 99 mg/dL   BUN 20 6 - 20 mg/dL   Creatinine, Ser 1.15 0.61 - 1.24 mg/dL   Calcium 10.0 8.9 - 10.3 mg/dL   GFR calc non Af Amer >60 >60 mL/min   GFR calc Af Amer >60 >60 mL/min   Anion gap 14 5 - 15   DG Chest Portable 1 View  Result Date: 08/10/2019 CLINICAL DATA:  COVID-19 EXAM: PORTABLE CHEST 1 VIEW COMPARISON:  06/14/2019 FINDINGS: The heart size and mediastinal contours are within normal limits. Both lungs are clear. The visualized skeletal structures are unremarkable. IMPRESSION: No active disease. Electronically Signed   By: Ulyses Jarred M.D.   On: 08/10/2019 23:17    Radiology DG Chest Portable 1 View  Result Date: 08/10/2019 CLINICAL DATA:  COVID-19 EXAM: PORTABLE CHEST 1 VIEW COMPARISON:  06/14/2019 FINDINGS: The heart size and mediastinal contours are within normal limits. Both lungs are clear. The visualized skeletal structures are unremarkable. IMPRESSION: No active disease. Electronically Signed   By: Ulyses Jarred M.D.   On: 08/10/2019 23:17    Procedures Procedures (including critical care time)  Medications Ordered in ED Medications  ondansetron (ZOFRAN-ODT) disintegrating tablet 8 mg (8 mg Oral Given 08/11/19 0235)    ED Course  I have reviewed the triage vital signs and the nursing notes.  Pertinent labs &  imaging results that were available during my care of the patient were reviewed by me and considered in my medical decision making (see chart for details).  PO challenged successfully in the department. Is here with girlfriend who is already covid positive.     Patient was counseled extensively on home self quarantine and not going out to eat or to stores  of any kind.     Iain Sawchuk was evaluated in Emergency Department on 08/11/2019 for the symptoms described in the history of present illness. He was evaluated in the context of the global COVID-19 pandemic, which necessitated consideration that the patient might be at risk for infection with the SARS-CoV-2 virus that causes COVID-19. Institutional protocols and algorithms that pertain to the evaluation of patients at risk for COVID-19 are in a state of rapid change based on information released by regulatory bodies including the CDC and federal and state organizations. These policies and algorithms were followed during the patient's care in the ED.  Final Clinical Impression(s) / ED Diagnoses  Return for intractable cough, coughing up blood,fevers >100.4 unrelieved by medication, shortness of breath, intractable vomiting, chest pain, shortness of breath, weakness,numbness, changes in speech, facial asymmetry,abdominal pain, passing out,Inability to tolerate liquids or food, cough, altered mental status or any concerns. No signs of systemic illness or infection. The patient is nontoxic-appearing on exam and vital signs are within normal limits.   I have reviewed the triage vital signs and the nursing notes. Pertinent labs &imaging results that were available during my care of the patient were reviewed by me and considered in my medical decision making (see chart for details).  After history, exam, and medical workup I feel the patient has been appropriately medically screened and is safe for discharge home. Pertinent diagnoses were  discussed with the patient. Patient was given return      Dave Mannes, MD 08/11/19 3220    Nicanor Alcon, Rylea Selway, MD 08/11/19 2542

## 2019-08-11 NOTE — Discharge Instructions (Signed)
Person Under Monitoring Name: Stephen Shepard  Location: [address] [place] [address]   Infection Prevention Recommendations for Individuals Confirmed to have, or Being Evaluated for, 2019 Novel Coronavirus (COVID-19) Infection Who Receive Care at Home  Individuals who are confirmed to have, or are being evaluated for, COVID-19 should follow the prevention steps below until a healthcare provider or local or state health department says they can return to normal activities.  Stay home except to get medical care You should restrict activities outside your home, except for getting medical care. Do not go to work, school, or public areas, and do not use public transportation or taxis.  Call ahead before visiting your doctor Before your medical appointment, call the healthcare provider and tell them that you have, or are being evaluated for, COVID-19 infection. This will help the healthcare provider's office take steps to keep other people from getting infected. Ask your healthcare provider to call the local or state health department.  Monitor your symptoms Seek prompt medical attention if your illness is worsening (e.g., difficulty breathing). Before going to your medical appointment, call the healthcare provider and tell them that you have, or are being evaluated for, COVID-19 infection. Ask your healthcare provider to call the local or state health department.  Wear a facemask You should wear a facemask that covers your nose and mouth when you are in the same room with other people and when you visit a healthcare provider. People who live with or visit you should also wear a facemask while they are in the same room with you.  Separate yourself from other people in your home As much as possible, you should stay in a different room from other people in your home. Also, you should use a separate bathroom, if available.  Avoid sharing household items You should not  share dishes, drinking glasses, cups, eating utensils, towels, bedding, or other items with other people in your home. After using these items, you should wash them thoroughly with soap and water.  Cover your coughs and sneezes Cover your mouth and nose with a tissue when you cough or sneeze, or you can cough or sneeze into your sleeve. Throw used tissues in a lined trash can, and immediately wash your hands with soap and water for at least 20 seconds or use an alcohol-based hand rub.  Wash your [employer] your hands often and thoroughly with soap and water for at least 20 seconds. You can use an alcohol-based hand sanitizer if soap and water are not available and if your hands are not visibly dirty. Avoid touching your eyes, nose, and mouth with unwashed hands.   Prevention Steps for Caregivers and Household Members of Individuals Confirmed to have, or Being Evaluated for, COVID-19 Infection Being Cared for in the Home  If you live with, or provide care at home for, a person confirmed to have, or being evaluated for, COVID-19 infection please follow these guidelines to prevent infection:  Follow healthcare provider's instructions Make sure that you understand and can help the patient follow any healthcare provider instructions for all care.  Provide for the patient's basic needs You should help the patient with basic needs in the home and provide support for getting groceries, prescriptions, and other personal needs.  Monitor the patient's symptoms If they are getting sicker, call his or her medical provider and tell them that the patient has, or is being evaluated for, COVID-19 infection. This will help the healthcare provider's office take  steps to keep other people from getting infected. Ask the healthcare provider to call the local or state health department.  Limit the number of people who have contact with the patient If possible, have only one caregiver for the  patient. Other household members should stay in another home or place of residence. If this is not possible, they should stay in another room, or be separated from the patient as much as possible. Use a separate bathroom, if available. Restrict visitors who do not have an essential need to be in the home.  Keep older adults, very young children, and other sick people away from the patient Keep older adults, very young children, and those who have compromised immune systems or chronic health conditions away from the patient. This includes people with chronic heart, lung, or kidney conditions, diabetes, and cancer.  Ensure good ventilation Make sure that shared spaces in the home have good air flow, such as from an air conditioner or an opened window, weather permitting.  Wash your hands often Wash your hands often and thoroughly with soap and water for at least 20 seconds. You can use an alcohol based hand sanitizer if soap and water are not available and if your hands are not visibly dirty. Avoid touching your eyes, nose, and mouth with unwashed hands. Use disposable paper towels to dry your hands. If not available, use dedicated cloth towels and replace them when they become wet.  Wear a facemask and gloves Wear a disposable facemask at all times in the room and gloves when you touch or have contact with the patient's blood, body fluids, and/or secretions or excretions, such as sweat, saliva, sputum, nasal mucus, vomit, urine, or feces.  Ensure the mask fits over your nose and mouth tightly, and do not touch it during use. Throw out disposable facemasks and gloves after using them. Do not reuse. Wash your hands immediately after removing your facemask and gloves. If your personal clothing becomes contaminated, carefully remove clothing and launder. Wash your hands after handling contaminated clothing. Place all used disposable facemasks, gloves, and other waste in a lined container before  disposing them with other household waste. Remove gloves and wash your hands immediately after handling these items.  Do not share dishes, glasses, or other household items with the patient Avoid sharing household items. You should not share dishes, drinking glasses, cups, eating utensils, towels, bedding, or other items with a patient who is confirmed to have, or being evaluated for, COVID-19 infection. After the person uses these items, you should wash them thoroughly with soap and water.  Wash laundry thoroughly Immediately remove and wash clothes or bedding that have blood, body fluids, and/or secretions or excretions, such as sweat, saliva, sputum, nasal mucus, vomit, urine, or feces, on them. Wear gloves when handling laundry from the patient. Read and follow directions on labels of laundry or clothing items and detergent. In general, wash and dry with the warmest temperatures recommended on the label.  Clean all areas the individual has used often Clean all touchable surfaces, such as counters, tabletops, doorknobs, bathroom fixtures, toilets, phones, keyboards, tablets, and bedside tables, every day. Also, clean any surfaces that may have blood, body fluids, and/or secretions or excretions on them. Wear gloves when cleaning surfaces the patient has come in contact with. Use a diluted bleach solution (e.g., dilute bleach with 1 part bleach and 10 parts water) or a household disinfectant with a label that says EPA-registered for coronaviruses. To make a bleach solution  at home, add 1 tablespoon of bleach to 1 quart (4 cups) of water. For a larger supply, add  cup of bleach to 1 gallon (16 cups) of water. Read labels of cleaning products and follow recommendations provided on product labels. Labels contain instructions for safe and effective use of the cleaning product including precautions you should take when applying the product, such as wearing gloves or eye protection and making sure you  have good ventilation during use of the product. Remove gloves and wash hands immediately after cleaning.  Monitor yourself for signs and symptoms of illness Caregivers and household members are considered close contacts, should monitor their health, and will be asked to limit movement outside of the home to the extent possible. Follow the monitoring steps for close contacts listed on the symptom monitoring form.   ? If you have additional questions, contact your local health department or call the epidemiologist on call at [phone] (available 24/7). ? This guidance is subject to change. For the most up-to-date guidance from [facility], please refer to their website: [email or web address]

## 2019-08-12 ENCOUNTER — Telehealth (HOSPITAL_COMMUNITY): Payer: Self-pay

## 2019-08-13 ENCOUNTER — Telehealth (HOSPITAL_COMMUNITY): Payer: Self-pay

## 2021-04-15 ENCOUNTER — Encounter (HOSPITAL_COMMUNITY): Payer: Self-pay

## 2021-04-15 ENCOUNTER — Emergency Department (HOSPITAL_COMMUNITY)
Admission: EM | Admit: 2021-04-15 | Discharge: 2021-04-15 | Disposition: A | Discharge status: Home / Self Care | Payer: Self-pay | Attending: Student | Admitting: Student

## 2021-04-15 DIAGNOSIS — Z20822 Contact with and (suspected) exposure to covid-19: Secondary | ICD-10-CM | POA: Insufficient documentation

## 2021-04-15 DIAGNOSIS — R519 Headache, unspecified: Secondary | ICD-10-CM | POA: Insufficient documentation

## 2021-04-15 DIAGNOSIS — Z5321 Procedure and treatment not carried out due to patient leaving prior to being seen by health care provider: Secondary | ICD-10-CM | POA: Insufficient documentation

## 2021-04-15 DIAGNOSIS — R0981 Nasal congestion: Secondary | ICD-10-CM | POA: Insufficient documentation

## 2021-04-15 LAB — SARS CORONAVIRUS 2 (TAT 6-24 HRS): SARS Coronavirus 2: NEGATIVE

## 2021-04-15 NOTE — ED Triage Notes (Signed)
Pt reports nasal congestion for the past week with headaches.

## 2021-04-15 NOTE — ED Notes (Signed)
Pt gave the technicians his stickers and left at this time.

## 2021-04-15 NOTE — ED Provider Notes (Signed)
Emergency Medicine Provider Triage Evaluation Note  Douglas Smolinsky , a 30 y.o. male  was evaluated in triage.  Pt complains of nasal congestion and yellow nasal discharge x1 week.  Initially there is associated congestion, headaches and body aches.  Patient is COVID vaccinated, denies any known exposures.  Patient states he needs antibiotics, has not tried any allergy medicine..  Review of Systems  Positive: Sinus infection Negative: Fever  Physical Exam  BP 121/83 (BP Location: Right Arm)   Pulse 73   Temp 98.9 F (37.2 C) (Oral)   Resp 14   SpO2 99%  Gen:   Awake, no distress   Resp:  Normal effort  MSK:   Moves extremities without difficulty  Other:  Nasal congestion  Medical Decision Making  Medically screening exam initiated at 2:56 PM.  Appropriate orders placed.  Roshad Hack was informed that the remainder of the evaluation will be completed by another provider, this initial triage assessment does not replace that evaluation, and the importance of remaining in the ED until their evaluation is complete.  Patient wants to be seen in the back.  Offered allergy medicine over-the-counter treatments.    Theron Arista, PA-C 04/15/21 1458    Wynetta Fines, MD 04/16/21 [phone]

## 2021-04-22 ENCOUNTER — Other Ambulatory Visit: Payer: Self-pay

## 2021-04-22 ENCOUNTER — Emergency Department (HOSPITAL_BASED_OUTPATIENT_CLINIC_OR_DEPARTMENT_OTHER)
Admission: EM | Admit: 2021-04-22 | Discharge: 2021-04-22 | Disposition: A | Discharge status: Home / Self Care | Payer: Self-pay | Attending: Emergency Medicine | Admitting: Emergency Medicine

## 2021-04-22 ENCOUNTER — Encounter (HOSPITAL_BASED_OUTPATIENT_CLINIC_OR_DEPARTMENT_OTHER): Payer: Self-pay | Admitting: *Deleted

## 2021-04-22 DIAGNOSIS — Z20822 Contact with and (suspected) exposure to covid-19: Secondary | ICD-10-CM | POA: Insufficient documentation

## 2021-04-22 DIAGNOSIS — J019 Acute sinusitis, unspecified: Secondary | ICD-10-CM | POA: Insufficient documentation

## 2021-04-22 DIAGNOSIS — F17210 Nicotine dependence, cigarettes, uncomplicated: Secondary | ICD-10-CM | POA: Insufficient documentation

## 2021-04-22 LAB — RESP PANEL BY RT-PCR (FLU A&B, COVID) ARPGX2
Influenza A by PCR: NEGATIVE
Influenza B by PCR: NEGATIVE
SARS Coronavirus 2 by RT PCR: NEGATIVE

## 2021-04-22 MED ORDER — AMOXICILLIN-POT CLAVULANATE 875-125 MG PO TABS: 1.0000 | ORAL_TABLET | Freq: Two times a day (BID) | ORAL | 0 refills | Status: DC

## 2021-04-22 NOTE — ED Provider Notes (Signed)
[facility] EMERGENCY DEPT Provider Note   CSN: [id number] Arrival date & time: 04/22/21  1536     History Chief Complaint  Patient presents with   Nasal Congestion    Stephen Shepard is a 30 y.o. male with past medical history of seizures.  Presents to the emergency department with a chief complaint of nasal congestion.  Patient reports that nasal congestion has been present over the last 2 weeks.  Patient reports that he is producing copious amounts of thick yellow mucus.  Patient denies any alleviating factors.  Patient endorses sinus pressure, fever, chills, fatigue, body aches, sore throat.    HPI     Past Medical History:  Diagnosis Date   Seizures (HCC)     There are no problems to display for this patient.   History reviewed. No pertinent surgical history.     History reviewed. No pertinent family history.  Social History   Tobacco Use   Smoking status: Every Day    Packs/day: 1.00    Types: Cigarettes   Smokeless tobacco: Never  Vaping Use   Vaping Use: Never used  Substance Use Topics   Alcohol use: Never   Drug use: Never    Home Medications Prior to Admission medications   Medication Sig Start Date End Date Taking? Authorizing Provider  clonazePAM (KLONOPIN PO) Take by mouth.    [provider]    Allergies    Ibuprofen and Other  Review of Systems   Review of Systems  Constitutional:  Positive for chills, fatigue and fever.  HENT:  Positive for congestion, dental problem, rhinorrhea and sore throat. Negative for drooling, tinnitus and trouble swallowing.   Eyes:  Negative for pain and visual disturbance.  Respiratory:  Negative for cough and shortness of breath.   Cardiovascular:  Negative for chest pain.  Gastrointestinal:  Negative for abdominal pain, nausea and vomiting.  Genitourinary:  Negative for difficulty urinating and dysuria.  Musculoskeletal:  Positive for myalgias (Generalized). Negative for back pain,  neck pain and neck stiffness.  Skin:  Negative for color change, pallor, rash and wound.  Neurological:  Negative for dizziness, syncope, light-headedness and headaches.  Psychiatric/Behavioral:  Negative for confusion.    Physical Exam Updated Vital Signs BP 116/60   Pulse 85   Temp 98.3 F (36.8 C) (Oral)   Resp 16   Ht 6\' 3"  (1.905 m)   Wt 79.4 kg   SpO2 100%   BMI 21.88 kg/m   Physical Exam Vitals and nursing note reviewed.  Constitutional:      General: He is not in acute distress.    Appearance: He is not ill-appearing, toxic-appearing or diaphoretic.  HENT:     Head: Normocephalic. No right periorbital erythema.     Jaw: No trismus, tenderness, swelling, pain on movement or malocclusion.     Nose: Congestion present.     Right Sinus: Frontal sinus tenderness present. No maxillary sinus tenderness.     Left Sinus: Maxillary sinus tenderness and frontal sinus tenderness present.     Mouth/Throat:     Mouth: Mucous membranes are moist. No lacerations, oral lesions or angioedema.     Dentition: Abnormal dentition. No dental tenderness, gingival swelling or dental abscesses.     Pharynx: Oropharynx is clear. Uvula midline. No pharyngeal swelling, oropharyngeal exudate, posterior oropharyngeal erythema or uvula swelling.     Tonsils: No tonsillar exudate or tonsillar abscesses. 1+ on the right. 1+ on the left.     Comments: Patient  has no swelling or edema to oropharynx or oral mucosa.  No dental tenderness.  No dental abscess.  No signs of peritonsillar abscess.  Patient handles oral secretions without difficulty. Eyes:     General: No scleral icterus.       Right eye: No discharge.        Left eye: No discharge.     Extraocular Movements: Extraocular movements intact.     Conjunctiva/sclera: Conjunctivae normal.  Cardiovascular:     Rate and Rhythm: Normal rate.  Pulmonary:     Effort: Pulmonary effort is normal. No respiratory distress.     Breath sounds: Normal  breath sounds. No stridor.  Musculoskeletal:     Cervical back: Full passive range of motion without pain, normal range of motion and neck supple. No edema, erythema, signs of trauma, rigidity, torticollis or crepitus. No pain with movement, spinous process tenderness or muscular tenderness. Normal range of motion.  Lymphadenopathy:     Cervical: No cervical adenopathy.  Skin:    General: Skin is warm and dry.  Neurological:     General: No focal deficit present.     Mental Status: He is alert and oriented to person, place, and time.     GCS: GCS eye subscore is 4. GCS verbal subscore is 5. GCS motor subscore is 6.  Psychiatric:        Behavior: Behavior is cooperative.    ED Results / Procedures / Treatments   Labs (all labs ordered are listed, but only abnormal results are displayed) Labs Reviewed  SARS CORONAVIRUS 2 (TAT 6-24 HRS)    EKG None  Radiology No results found.  Procedures Procedures   Medications Ordered in ED Medications - No data to display  ED Course  I have reviewed the triage vital signs and the nursing notes.  Pertinent labs & imaging results that were available during my care of the patient were reviewed by me and considered in my medical decision making (see chart for details).    MDM Rules/Calculators/A&P                           Alert 30 year old male no acute distress, nontoxic-appearing.  Presents emergency department complaint of rhinorrhea and nasal congestion x2 weeks.  Low suspicion for strep pharyngitis as patient has no tonsillar exudate.  No signs of peritonsillar abscess, swelling to oropharynx or oral mucosa, edema to neck, passive range of motion with neck.  Physical exam patient has tenderness to bilateral frontal and left maxillary sinus.  Patient noted to have yellow mucus.  Suspect bacterial sinusitis.  Additionally will test patient for COVID-19.  We will start patient on antibiotics.  Patient to follow-up with his primary care  provider.  Stephen Shepard was evaluated in Emergency Department on 04/22/2021 for the symptoms described in the history of present illness. He was evaluated in the context of the global COVID-19 pandemic, which necessitated consideration that the patient might be at risk for infection with the SARS-CoV-2 virus that causes COVID-19. Institutional protocols and algorithms that pertain to the evaluation of patients at risk for COVID-19 are in a state of rapid change based on information released by regulatory bodies including the CDC and federal and state organizations. These policies and algorithms were followed during the patient's care in the ED.   Final Clinical Impression(s) / ED Diagnoses Final diagnoses:  Acute sinusitis, recurrence not specified, unspecified location    Rx / DC Orders ED  Discharge Orders          Ordered    amoxicillin-clavulanate (AUGMENTIN) 875-125 MG tablet  Every 12 hours        04/22/21 1832             Berneice Heinrich 04/22/21 Teola Bradley, MD 05/04/21 [phone]

## 2021-04-22 NOTE — Discharge Instructions (Signed)
You came to the emergency department today to be evaluated for your nasal congestion and sinus pain.  Based on your physical exam I suspect that you have a sinus infection.  Because of this I have started you on the medication Augmentin.  Please take this medication as prescribed.  You may have diarrhea from the antibiotics.  It is very important that you continue to take the antibiotics even if you get diarrhea unless a medical professional tells you that you may stop taking them.  If you stop too early the bacteria you are being treated for will become stronger and you may need different, more powerful antibiotics that have more side effects and worsening diarrhea.  Please stay well hydrated and consider probiotics as they may decrease the severity of your diarrhea.    You have a COVID test pending. Please isolate at home while awaiting your results.  You can check your results on [facility] MyChart > If your test is negative, stay home until your fever has resolved/your symptoms are improving. > If your test is positive, isolate at home for at least 7 days after the day your symptoms initially began, and THEN at least 24 hours after you are fever-free without the help of medications (Tylenol/acetaminophen and Advil/ibuprofen/Motrin) AND your symptoms are improving.  Get help right away if: You have a severe headache. You have persistent vomiting. You have severe pain or swelling around your face or eyes. You have vision problems. You develop confusion. Your neck is stiff. You have trouble breathing.

## 2021-04-22 NOTE — ED Triage Notes (Addendum)
Nasal congestion for 2 weeks and toothache.

## 2021-09-29 ENCOUNTER — Other Ambulatory Visit (HOSPITAL_BASED_OUTPATIENT_CLINIC_OR_DEPARTMENT_OTHER): Payer: Self-pay

## 2021-09-29 ENCOUNTER — Other Ambulatory Visit: Payer: Self-pay

## 2021-09-29 ENCOUNTER — Emergency Department (HOSPITAL_BASED_OUTPATIENT_CLINIC_OR_DEPARTMENT_OTHER)
Admission: EM | Admit: 2021-09-29 | Discharge: 2021-09-29 | Disposition: A | Discharge status: Home / Self Care | Payer: Self-pay | Attending: Emergency Medicine | Admitting: Emergency Medicine

## 2021-09-29 ENCOUNTER — Encounter (HOSPITAL_BASED_OUTPATIENT_CLINIC_OR_DEPARTMENT_OTHER): Payer: Self-pay

## 2021-09-29 ENCOUNTER — Emergency Department (HOSPITAL_BASED_OUTPATIENT_CLINIC_OR_DEPARTMENT_OTHER): Payer: Self-pay | Admitting: Radiology

## 2021-09-29 DIAGNOSIS — M549 Dorsalgia, unspecified: Secondary | ICD-10-CM

## 2021-09-29 DIAGNOSIS — X500XXA Overexertion from strenuous movement or load, initial encounter: Secondary | ICD-10-CM | POA: Insufficient documentation

## 2021-09-29 DIAGNOSIS — M546 Pain in thoracic spine: Secondary | ICD-10-CM | POA: Insufficient documentation

## 2021-09-29 DIAGNOSIS — R0602 Shortness of breath: Secondary | ICD-10-CM | POA: Insufficient documentation

## 2021-09-29 IMAGING — DX DG CHEST 2V
2 series · 2 of 2 positions shown · non-contrast
Comparison: [DATE].

CLINICAL DATA: Acute back pain.

EXAM:
CHEST - 2 VIEW

[chest ap]
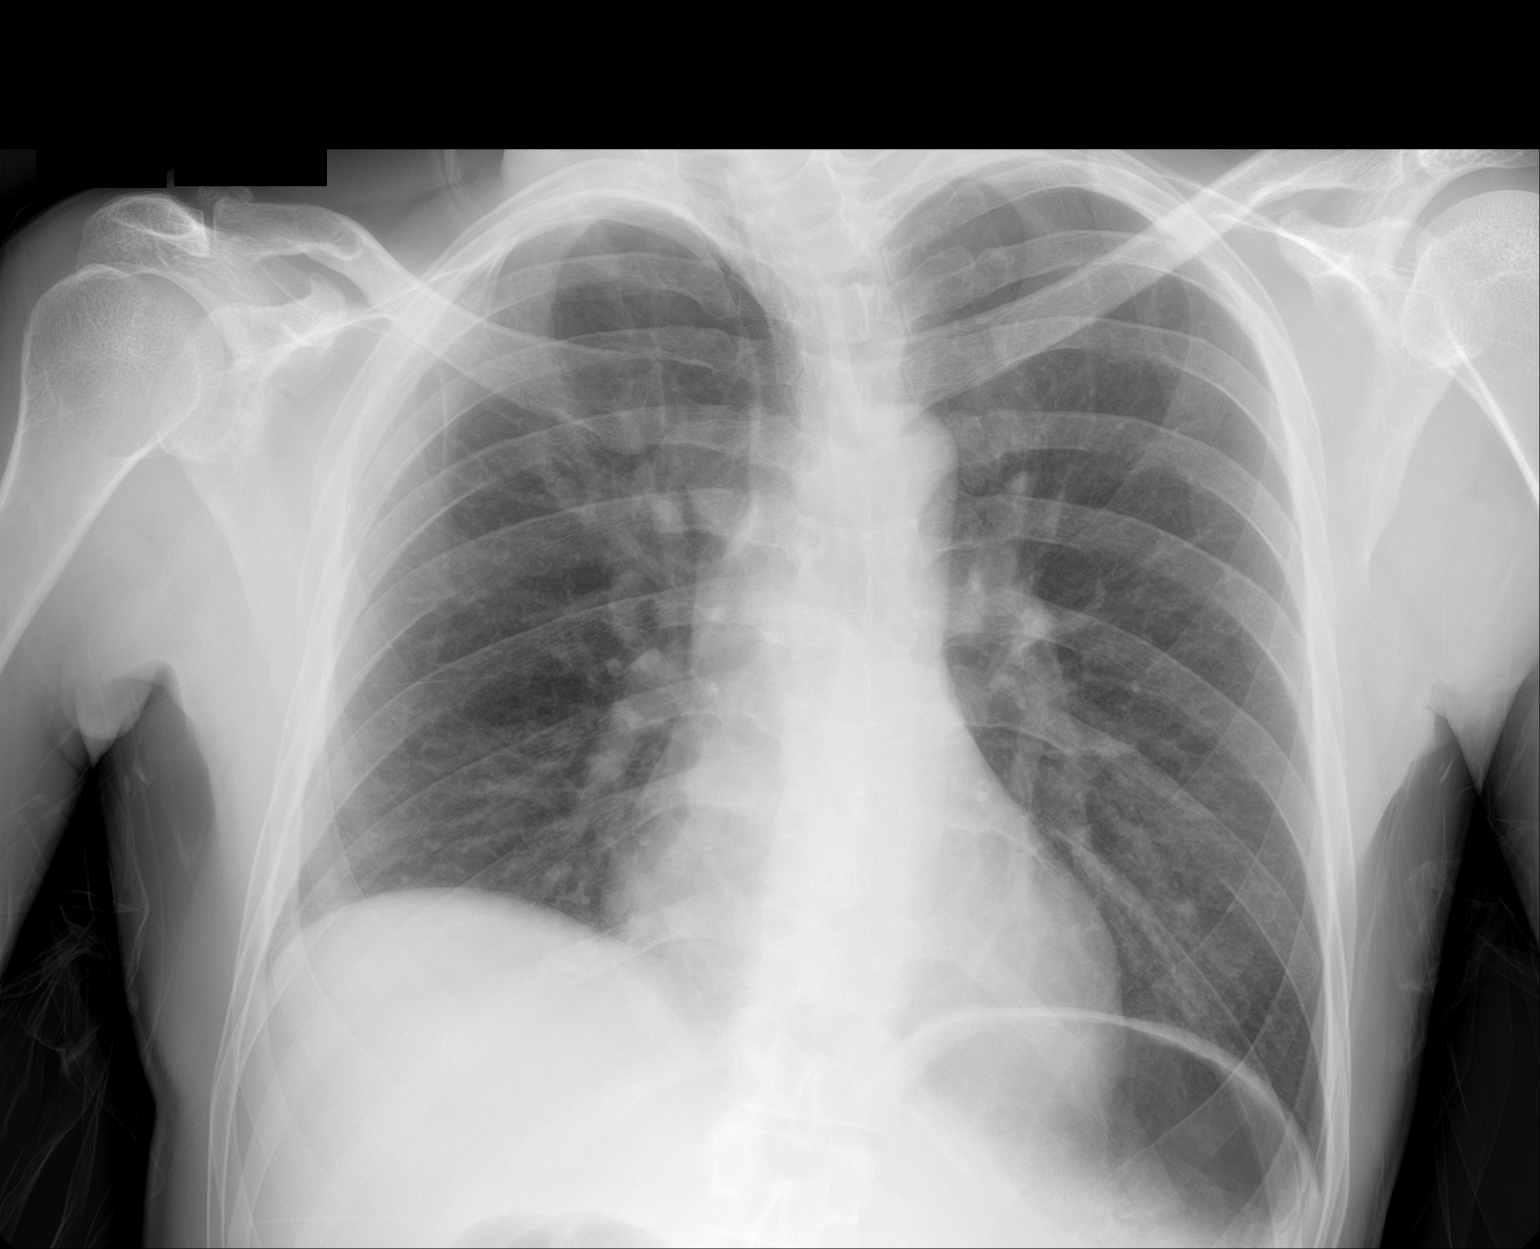

[chest lat]
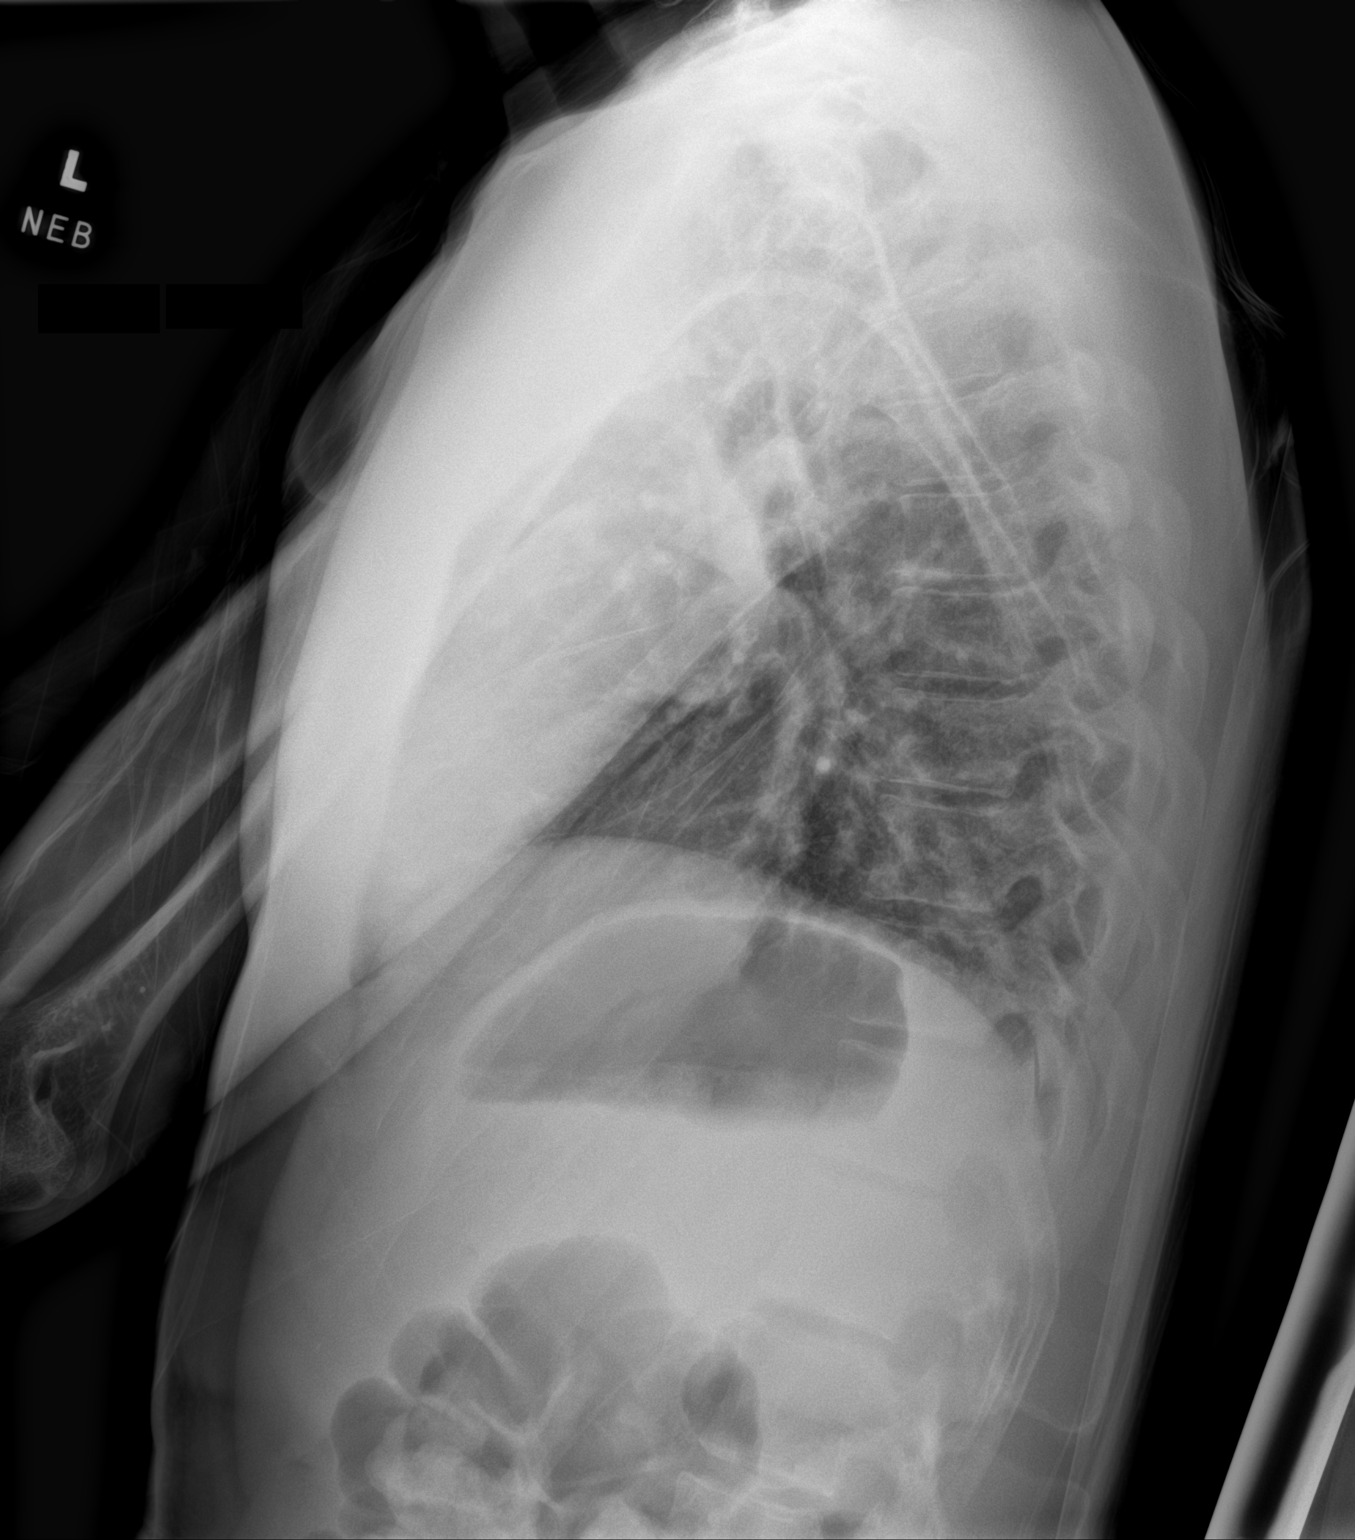

[2 of 2 positions shown; findings below may reference images not displayed]

FINDINGS: The heart size and mediastinal contours are within normal limits.
Both lungs are clear. The visualized skeletal structures are
unremarkable.
IMPRESSION: No active cardiopulmonary disease.

## 2021-09-29 MED ORDER — METHOCARBAMOL 500 MG PO TABS
1000.0000 mg | ORAL_TABLET | Freq: Two times a day (BID) | ORAL | 0 refills | Status: DC
  Filled 2021-09-29: qty 20, 5d supply, fill #0

## 2021-09-29 MED ORDER — OXYCODONE-ACETAMINOPHEN 5-325 MG PO TABS
2.0000 | ORAL_TABLET | Freq: Once | ORAL | Status: AC
  Administered 2021-09-29: 2 via ORAL
  Filled 2021-09-29: qty 2

## 2021-09-29 MED ORDER — LIDOCAINE 5 % EX PTCH
1.0000 | MEDICATED_PATCH | CUTANEOUS | 0 refills | Status: DC
  Filled 2021-09-29: qty 30, 30d supply, fill #0

## 2021-09-29 MED ORDER — DIAZEPAM 5 MG PO TABS
10.0000 mg | ORAL_TABLET | Freq: Once | ORAL | Status: AC
  Administered 2021-09-29: 10 mg via ORAL
  Filled 2021-09-29: qty 2

## 2021-09-29 NOTE — ED Triage Notes (Signed)
Patient BIB [facility] from [facility] for Back Pain. ? ?Patient raised his arms to place [employer] [place] [employer] when he felt a Sudden Acute Onset of Mid Back Pain. ? ?No PMH. A&Ox4. GCS 15. BIB [facility]. Uncomfortable during Triage.  ? ?VSS with [facility] besides BP of 170/100. ?

## 2021-09-29 NOTE — ED Notes (Signed)
Dc instructions reviewed with patient. Patient voiced understanding. Dc with belongings.  °

## 2021-09-29 NOTE — ED Notes (Signed)
Patient transported to X-ray 

## 2021-09-29 NOTE — ED Notes (Signed)
Patient refused to be changed into [facility] at this Time due to Pain. Patient made aware if Patient needs to be changed into [facility], Staff can assist. Patient still refused.  ?

## 2021-09-29 NOTE — ED Provider Notes (Signed)
?[facility] EMERGENCY DEPT ?Provider Note ? ? ?CSN: [id number] ?Arrival date & time: 09/29/21  1234 ? ?  ? ?History ? ?Chief Complaint  ?Patient presents with  ? Back Pain  ? ? ?Stephen Shepard is a 31 y.o. male. ? ?31 year old male presents with cute onset of midthoracic back pain that occurred after he lifted an object.  States pain is sharp and localized to his mid back and worse with movement.  Hurts to take a deep breath.  Denies any cough or hemoptysis.  No radiation down to the patient's legs.  Called EMS and transported here ? ? ?  ? ?Home Medications ?Prior to Admission medications   ?Medication Sig Start Date End Date Taking? Authorizing Provider  ?amoxicillin-clavulanate (AUGMENTIN) 875-125 MG tablet Take 1 tablet by mouth every 12 (twelve) hours. 04/22/21   Haskel Schroeder, PA-C  ?clonazePAM (KLONOPIN PO) Take by mouth.    [provider]  ?   ? ?Allergies    ?Ibuprofen and Other   ? ?Review of Systems   ?Review of Systems  ?Constitutional:  Negative for chills and fever.  ?HENT:  Negative for ear pain and sore throat.   ?Eyes:  Negative for pain and visual disturbance.  ?Respiratory:  Negative for cough and shortness of breath.   ?Cardiovascular:  Negative for chest pain and palpitations.  ?Gastrointestinal:  Negative for abdominal pain and vomiting.  ?Genitourinary:  Negative for dysuria and hematuria.  ?Musculoskeletal:  Negative for arthralgias and back pain.  ?Skin:  Negative for color change and rash.  ?Neurological:  Negative for seizures and syncope.  ?All other systems reviewed and are negative. ? ?Physical Exam ?Updated Vital Signs ?BP (!) 117/92 (BP Location: Left Arm)   Pulse 97   Temp 98.6 ?F (37 ?C) (Oral)   Resp 20   Ht 1.905 m (6\' 3" )   Wt 79.4 kg   SpO2 99%   BMI 21.88 kg/m?  ?Physical Exam ?Vitals and nursing note reviewed.  ?Constitutional:   ?   General: He is not in acute distress. ?   Appearance: Normal appearance. He is well-developed. He is not  toxic-appearing.  ?HENT:  ?   Head: Normocephalic and atraumatic.  ?Eyes:  ?   General: Lids are normal.  ?   Conjunctiva/sclera: Conjunctivae normal.  ?   Pupils: Pupils are equal, round, and reactive to light.  ?Neck:  ?   Thyroid: No thyroid mass.  ?   Trachea: No tracheal deviation.  ?Cardiovascular:  ?   Rate and Rhythm: Normal rate and regular rhythm.  ?   Heart sounds: Normal heart sounds. No murmur heard. ?  No gallop.  ?Pulmonary:  ?   Effort: Pulmonary effort is normal. No respiratory distress.  ?   Breath sounds: Normal breath sounds. No stridor. No decreased breath sounds, wheezing, rhonchi or rales.  ?Abdominal:  ?   General: There is no distension.  ?   Palpations: Abdomen is soft.  ?   Tenderness: There is no abdominal tenderness. There is no rebound.  ?Musculoskeletal:     ?   General: No tenderness. Normal range of motion.  ?   Cervical back: Normal range of motion and neck supple.  ?     Back: ? ?Skin: ?   General: Skin is warm and dry.  ?   Findings: No abrasion or rash.  ?Neurological:  ?   Mental Status: He is alert and oriented to person, place, and time. Mental status is at  baseline.  ?   GCS: GCS eye subscore is 4. GCS verbal subscore is 5. GCS motor subscore is 6.  ?   Cranial Nerves: No cranial nerve deficit.  ?   Sensory: No sensory deficit.  ?   Motor: Motor function is intact.  ?Psychiatric:     ?   Attention and Perception: Attention normal.     ?   Speech: Speech normal.     ?   Behavior: Behavior normal.  ? ? ?ED Results / Procedures / Treatments   ?Labs ?(all labs ordered are listed, but only abnormal results are displayed) ?Labs Reviewed - No data to display ? ?EKG ?None ? ?Radiology ?No results found. ? ?Procedures ?Procedures  ? ? ?Medications Ordered in ED ?Medications  ?diazepam (VALIUM) tablet 10 mg (has no administration in time range)  ?oxyCODONE-acetaminophen (PERCOCET/ROXICET) 5-325 MG per tablet 2 tablet (has no administration in time range)  ? ? ?ED Course/ Medical  Decision Making/ A&P ?  ?                        ?Medical Decision Making ?Amount and/or Complexity of Data Reviewed ?Radiology: ordered. ? ?Risk ?Prescription drug management. ? ? ?Patient presented with acute onset of back pain after lifting an object.  Concern for possible pneumothorax as he was slightly short of breath.  He had reproducible tenderness along his paraspinal muscles on his thoracic spine.  Patient medicated with Percocet and Valium and does feel a lot better.  Chest x-ray did not show any acute findings.  Musculoskeletal strain and will prescribe medications and will discharge ? ? ? ? ? ? ?Final Clinical Impression(s) / ED Diagnoses ?Final diagnoses:  ?Back pain  ? ? ?Rx / DC Orders ?ED Discharge Orders   ? ? None  ? ?  ? ? ?  ?Lorre Nick, MD ?09/29/21 1435 ? ?

## 2021-12-14 ENCOUNTER — Inpatient Hospital Stay (HOSPITAL_COMMUNITY): Payer: Self-pay

## 2021-12-14 ENCOUNTER — Emergency Department (HOSPITAL_COMMUNITY): Payer: Self-pay

## 2021-12-14 ENCOUNTER — Inpatient Hospital Stay (HOSPITAL_COMMUNITY)
Admission: EM | Admit: 2021-12-14 | Discharge: 2021-12-28 | DRG: 854 | Disposition: A | Discharge status: Home / Self Care | Payer: Self-pay | Attending: Internal Medicine | Admitting: Internal Medicine

## 2021-12-14 ENCOUNTER — Encounter (HOSPITAL_COMMUNITY): Payer: Self-pay

## 2021-12-14 DIAGNOSIS — F1990 Other psychoactive substance use, unspecified, uncomplicated: Secondary | ICD-10-CM

## 2021-12-14 DIAGNOSIS — L02414 Cutaneous abscess of left upper limb: Secondary | ICD-10-CM | POA: Diagnosis present

## 2021-12-14 DIAGNOSIS — L0390 Cellulitis, unspecified: Secondary | ICD-10-CM

## 2021-12-14 DIAGNOSIS — B377 Candidal sepsis: Principal | ICD-10-CM

## 2021-12-14 DIAGNOSIS — Z91038 Other insect allergy status: Secondary | ICD-10-CM

## 2021-12-14 DIAGNOSIS — R2681 Unsteadiness on feet: Secondary | ICD-10-CM

## 2021-12-14 DIAGNOSIS — M546 Pain in thoracic spine: Secondary | ICD-10-CM | POA: Diagnosis present

## 2021-12-14 DIAGNOSIS — L03119 Cellulitis of unspecified part of limb: Secondary | ICD-10-CM

## 2021-12-14 DIAGNOSIS — R079 Chest pain, unspecified: Secondary | ICD-10-CM

## 2021-12-14 DIAGNOSIS — Z681 Body mass index (BMI) 19 or less, adult: Secondary | ICD-10-CM

## 2021-12-14 DIAGNOSIS — L02511 Cutaneous abscess of right hand: Secondary | ICD-10-CM | POA: Diagnosis present

## 2021-12-14 DIAGNOSIS — R7303 Prediabetes: Secondary | ICD-10-CM

## 2021-12-14 DIAGNOSIS — M549 Dorsalgia, unspecified: Secondary | ICD-10-CM

## 2021-12-14 DIAGNOSIS — A419 Sepsis, unspecified organism: Secondary | ICD-10-CM

## 2021-12-14 DIAGNOSIS — B954 Other streptococcus as the cause of diseases classified elsewhere: Secondary | ICD-10-CM | POA: Diagnosis present

## 2021-12-14 DIAGNOSIS — R0602 Shortness of breath: Secondary | ICD-10-CM

## 2021-12-14 DIAGNOSIS — N179 Acute kidney failure, unspecified: Secondary | ICD-10-CM

## 2021-12-14 DIAGNOSIS — L02519 Cutaneous abscess of unspecified hand: Principal | ICD-10-CM

## 2021-12-14 DIAGNOSIS — Z886 Allergy status to analgesic agent status: Secondary | ICD-10-CM

## 2021-12-14 DIAGNOSIS — F419 Anxiety disorder, unspecified: Secondary | ICD-10-CM | POA: Insufficient documentation

## 2021-12-14 DIAGNOSIS — L02413 Cutaneous abscess of right upper limb: Secondary | ICD-10-CM | POA: Diagnosis present

## 2021-12-14 DIAGNOSIS — B1920 Unspecified viral hepatitis C without hepatic coma: Secondary | ICD-10-CM | POA: Insufficient documentation

## 2021-12-14 DIAGNOSIS — R109 Unspecified abdominal pain: Secondary | ICD-10-CM

## 2021-12-14 DIAGNOSIS — E440 Moderate protein-calorie malnutrition: Secondary | ICD-10-CM | POA: Insufficient documentation

## 2021-12-14 DIAGNOSIS — Z720 Tobacco use: Secondary | ICD-10-CM

## 2021-12-14 DIAGNOSIS — L0291 Cutaneous abscess, unspecified: Secondary | ICD-10-CM | POA: Diagnosis present

## 2021-12-14 DIAGNOSIS — R768 Other specified abnormal immunological findings in serum: Secondary | ICD-10-CM

## 2021-12-14 DIAGNOSIS — Z20822 Contact with and (suspected) exposure to covid-19: Secondary | ICD-10-CM | POA: Diagnosis present

## 2021-12-14 DIAGNOSIS — F17210 Nicotine dependence, cigarettes, uncomplicated: Secondary | ICD-10-CM | POA: Diagnosis present

## 2021-12-14 DIAGNOSIS — B182 Chronic viral hepatitis C: Secondary | ICD-10-CM

## 2021-12-14 DIAGNOSIS — F1110 Opioid abuse, uncomplicated: Secondary | ICD-10-CM | POA: Diagnosis present

## 2021-12-14 HISTORY — DX: Other psychoactive substance abuse, uncomplicated: F19.10

## 2021-12-14 HISTORY — DX: Anxiety disorder, unspecified: F41.9

## 2021-12-14 HISTORY — DX: Unspecified asthma, uncomplicated: J45.909

## 2021-12-14 LAB — CBC WITH DIFFERENTIAL/PLATELET
Abs Immature Granulocytes: 0.06 10*3/uL (ref 0.00–0.07)
Basophils Absolute: 0.1 10*3/uL (ref 0.0–0.1)
Basophils Relative: 0 %
Eosinophils Absolute: 0.2 10*3/uL (ref 0.0–0.5)
Eosinophils Relative: 1 %
HCT: 42.5 % (ref 39.0–52.0)
Hemoglobin: 13.8 g/dL (ref 13.0–17.0)
Immature Granulocytes: 0 %
Lymphocytes Relative: 15 %
Lymphs Abs: 2.3 10*3/uL (ref 0.7–4.0)
MCH: 28.3 pg (ref 26.0–34.0)
MCHC: 32.5 g/dL (ref 30.0–36.0)
MCV: 87.1 fL (ref 80.0–100.0)
Monocytes Absolute: 0.7 10*3/uL (ref 0.1–1.0)
Monocytes Relative: 5 %
Neutro Abs: 12.3 10*3/uL — ABNORMAL HIGH (ref 1.7–7.7)
Neutrophils Relative %: 79 %
Platelets: 435 10*3/uL — ABNORMAL HIGH (ref 150–400)
RBC: 4.88 MIL/uL (ref 4.22–5.81)
RDW: 14.1 % (ref 11.5–15.5)
WBC: 15.7 10*3/uL — ABNORMAL HIGH (ref 4.0–10.5)
nRBC: 0 % (ref 0.0–0.2)

## 2021-12-14 LAB — BASIC METABOLIC PANEL
Anion gap: 10 (ref 5–15)
BUN: 21 mg/dL — ABNORMAL HIGH (ref 6–20)
CO2: 30 mmol/L (ref 22–32)
Calcium: 10.1 mg/dL (ref 8.9–10.3)
Chloride: 99 mmol/L (ref 98–111)
Creatinine, Ser: 1.61 mg/dL — ABNORMAL HIGH (ref 0.61–1.24)
GFR, Estimated: 58 mL/min — ABNORMAL LOW (ref >60)
Glucose, Bld: 111 mg/dL — ABNORMAL HIGH (ref 70–99)
Potassium: 3.8 mmol/L (ref 3.5–5.1)
Sodium: 139 mmol/L (ref 135–145)

## 2021-12-14 LAB — RESP PANEL BY RT-PCR (FLU A&B, COVID) ARPGX2
Influenza A by PCR: NEGATIVE
Influenza B by PCR: NEGATIVE
SARS Coronavirus 2 by RT PCR: NEGATIVE

## 2021-12-14 LAB — LACTIC ACID, PLASMA
Lactic Acid, Venous: 1.3 mmol/L (ref 0.5–1.9)
Lactic Acid, Venous: 0.5 mmol/L (ref 0.5–1.9)

## 2021-12-14 IMAGING — DX DG WRIST COMPLETE 3+V*R*
4 series · 4 of 4 positions shown · non-contrast
Comparison: None Available.

CLINICAL DATA: Right wrist pain and swelling.

EXAM:
RIGHT WRIST - COMPLETE 3+ VIEW

[wrist ap (1 of 2)]
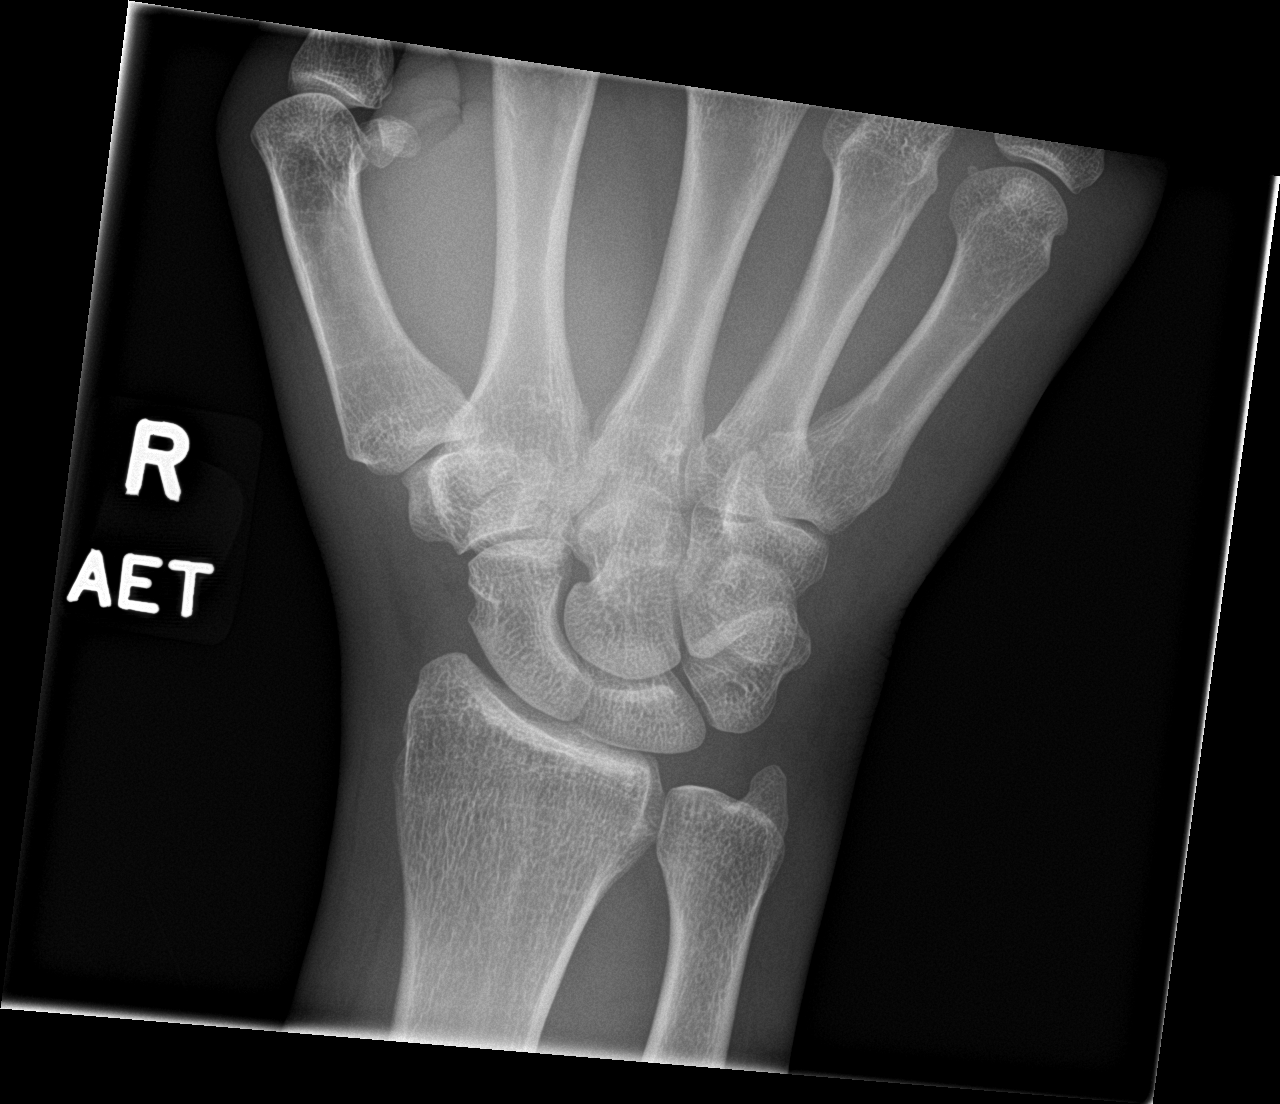

[wrist obl]
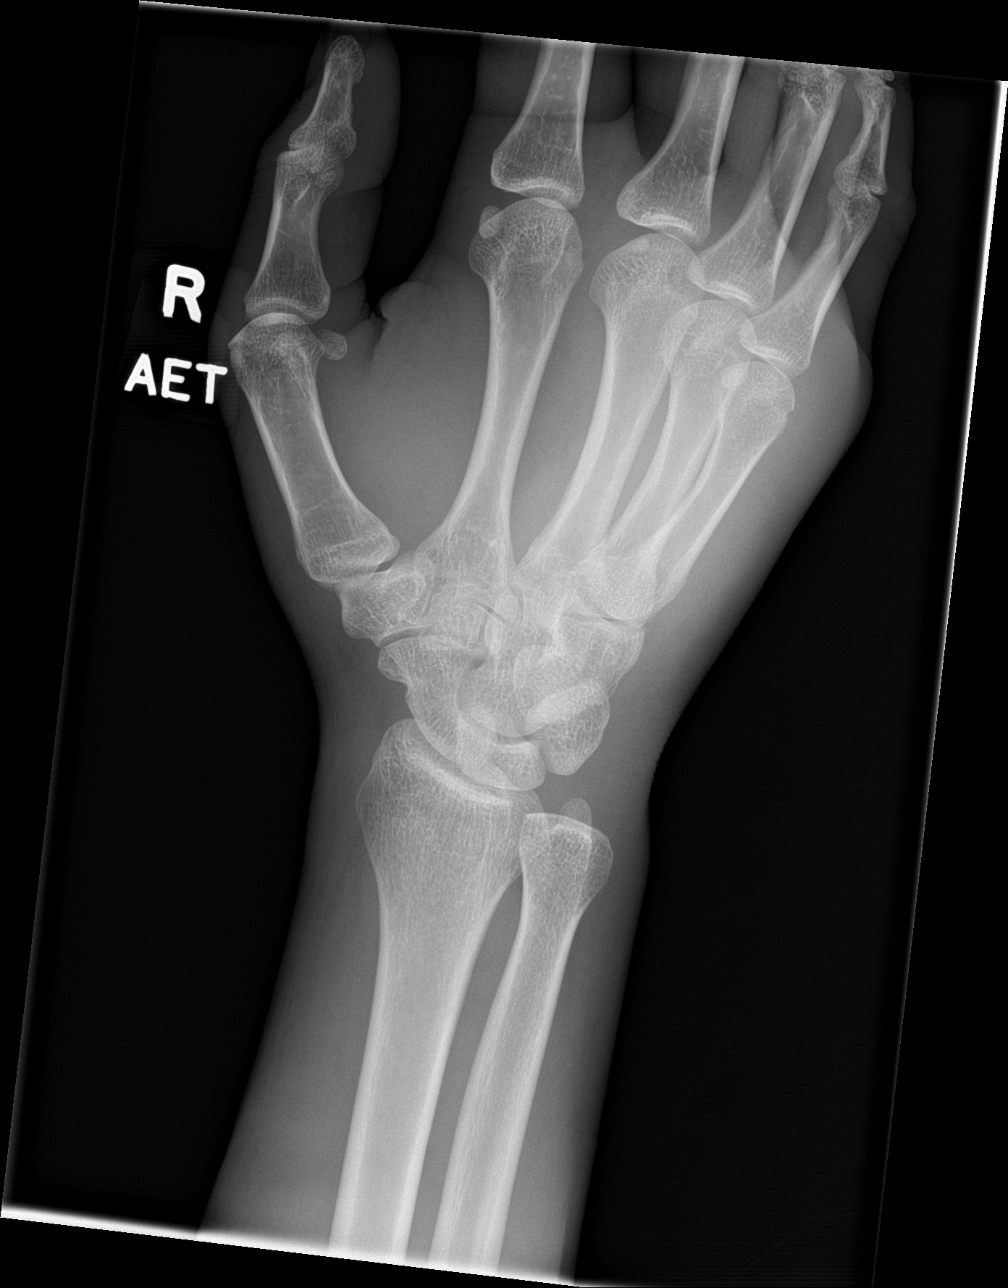

[wrist ap (2 of 2)]
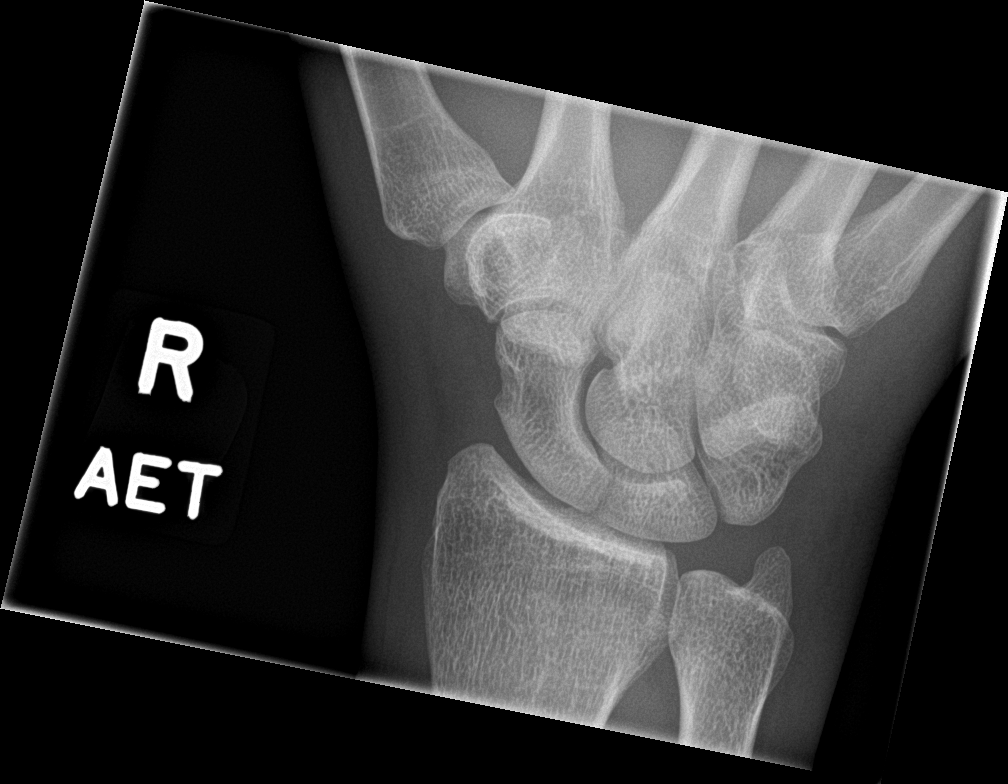

[wrist lat]
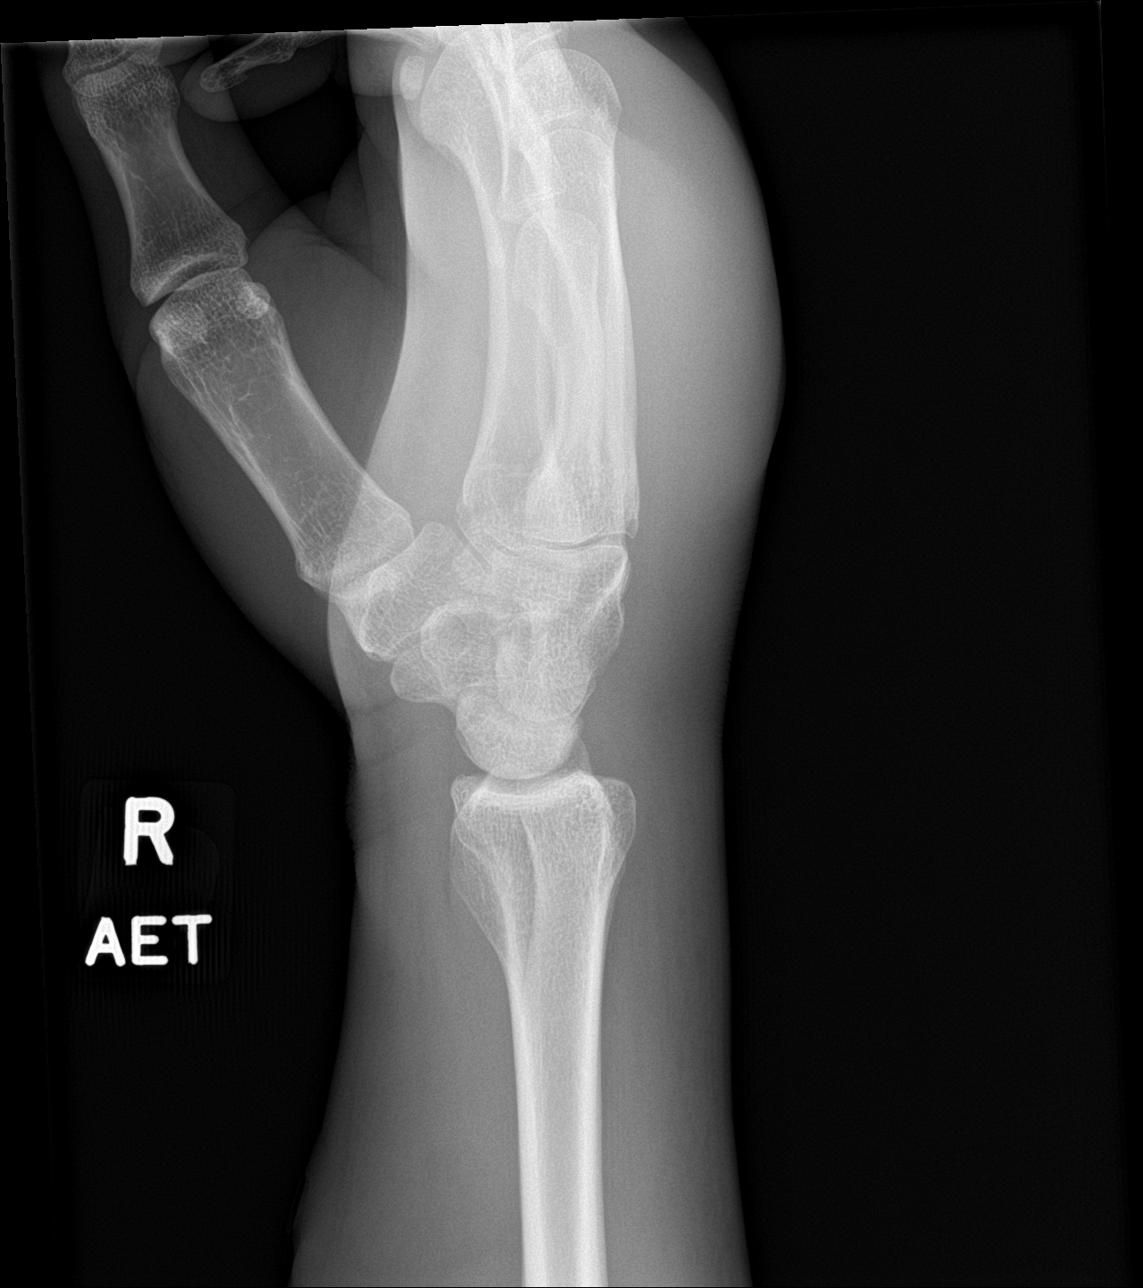

[4 of 4 positions shown; findings below may reference images not displayed]

FINDINGS: The joint spaces are maintained. No acute bony findings or
destructive bony changes.
IMPRESSION: No acute bony findings.

## 2021-12-14 IMAGING — DX DG HAND COMPLETE 3+V*R*
3 series · 3 of 3 positions shown · non-contrast
Comparison: None Available.

CLINICAL DATA: Pain and swelling.

EXAM:
RIGHT HAND - COMPLETE 3+ VIEW

[hand ap]
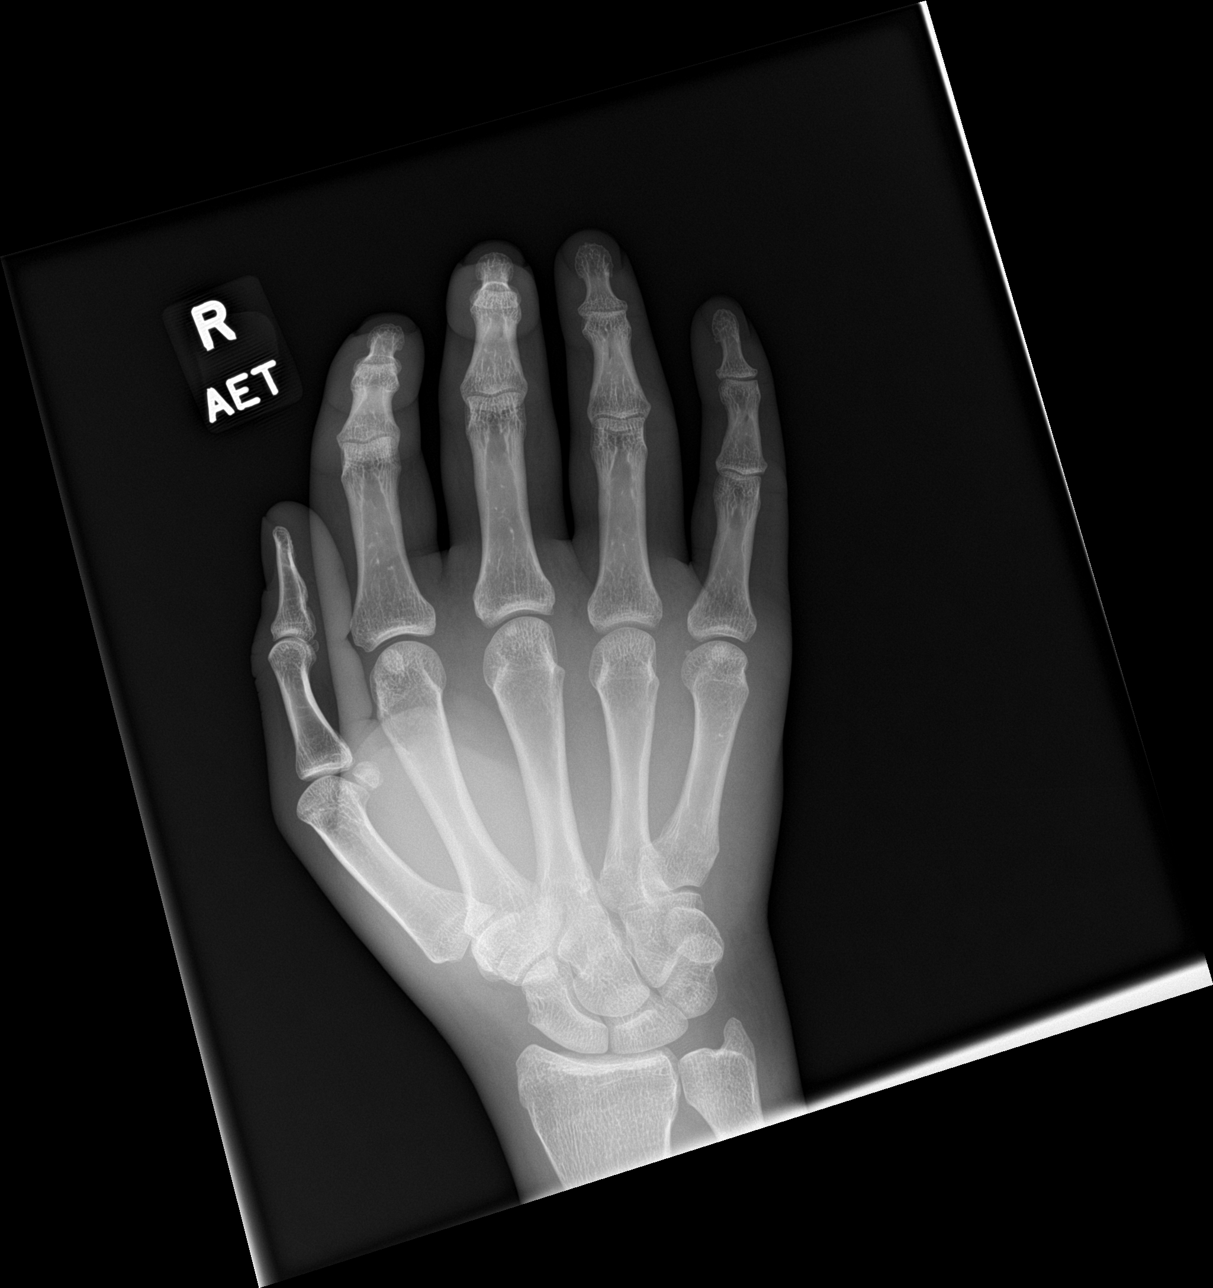

[hand obl]
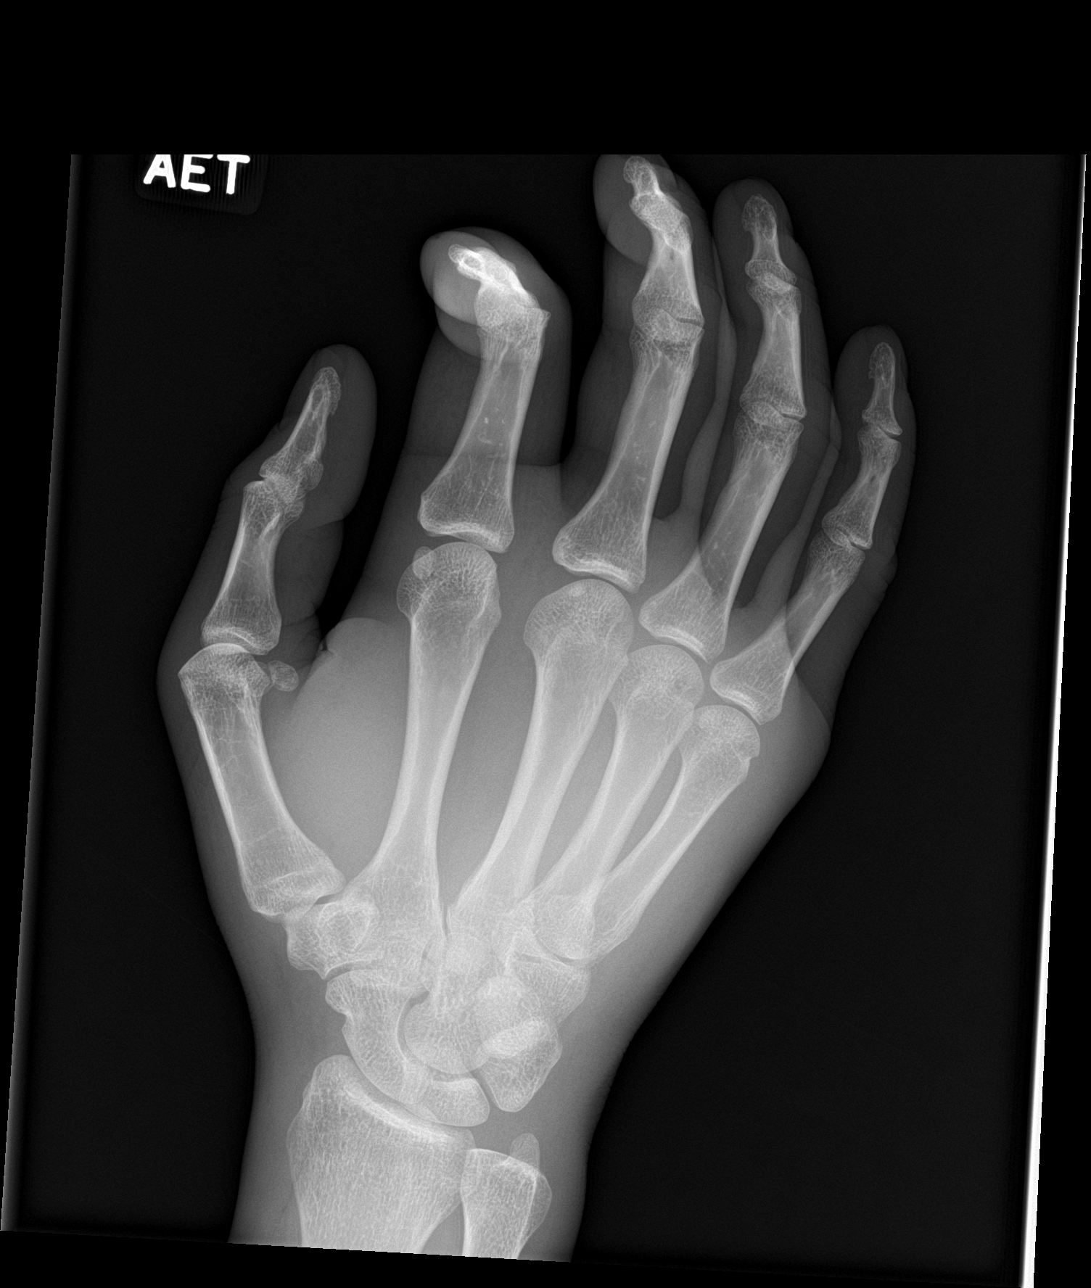

[hand lat]
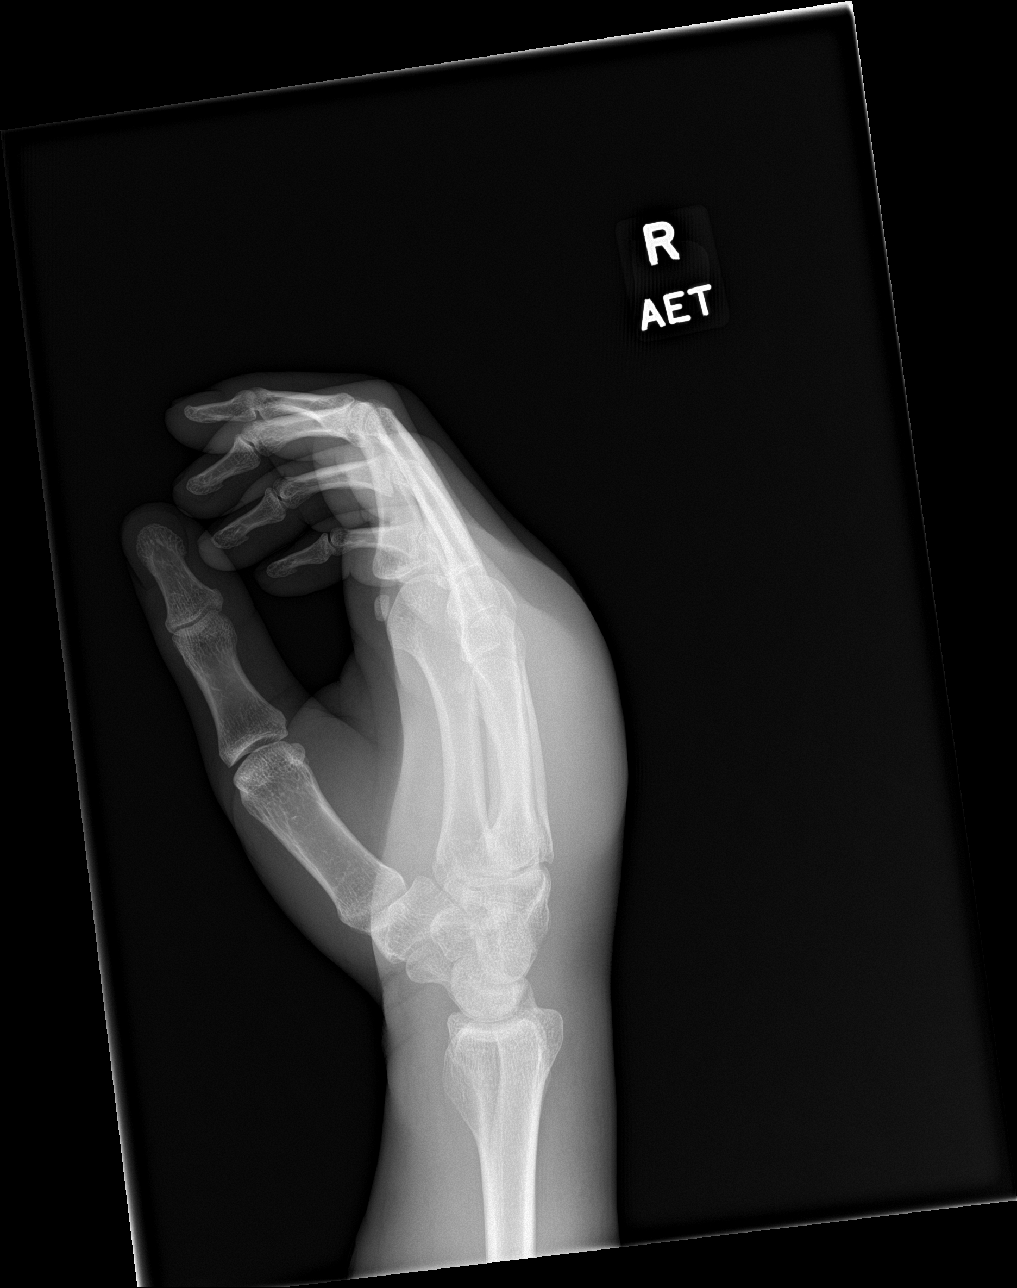

[3 of 3 positions shown; findings below may reference images not displayed]

FINDINGS: The joint spaces are maintained. No acute bony findings or
destructive bony changes. There is massive soft tissue swelling
noted on the dorsum of the hand. No gas is seen in the soft tissues.
No radiopaque foreign body.
IMPRESSION: Massive soft tissue swelling but no acute bony findings.

## 2021-12-14 IMAGING — DX DG CHEST 1V PORT
1 series · 1 of 1 positions shown · non-contrast
Comparison: [DATE]

CLINICAL DATA: Dyspnea

EXAM:
PORTABLE CHEST 1 VIEW

[chest ap]
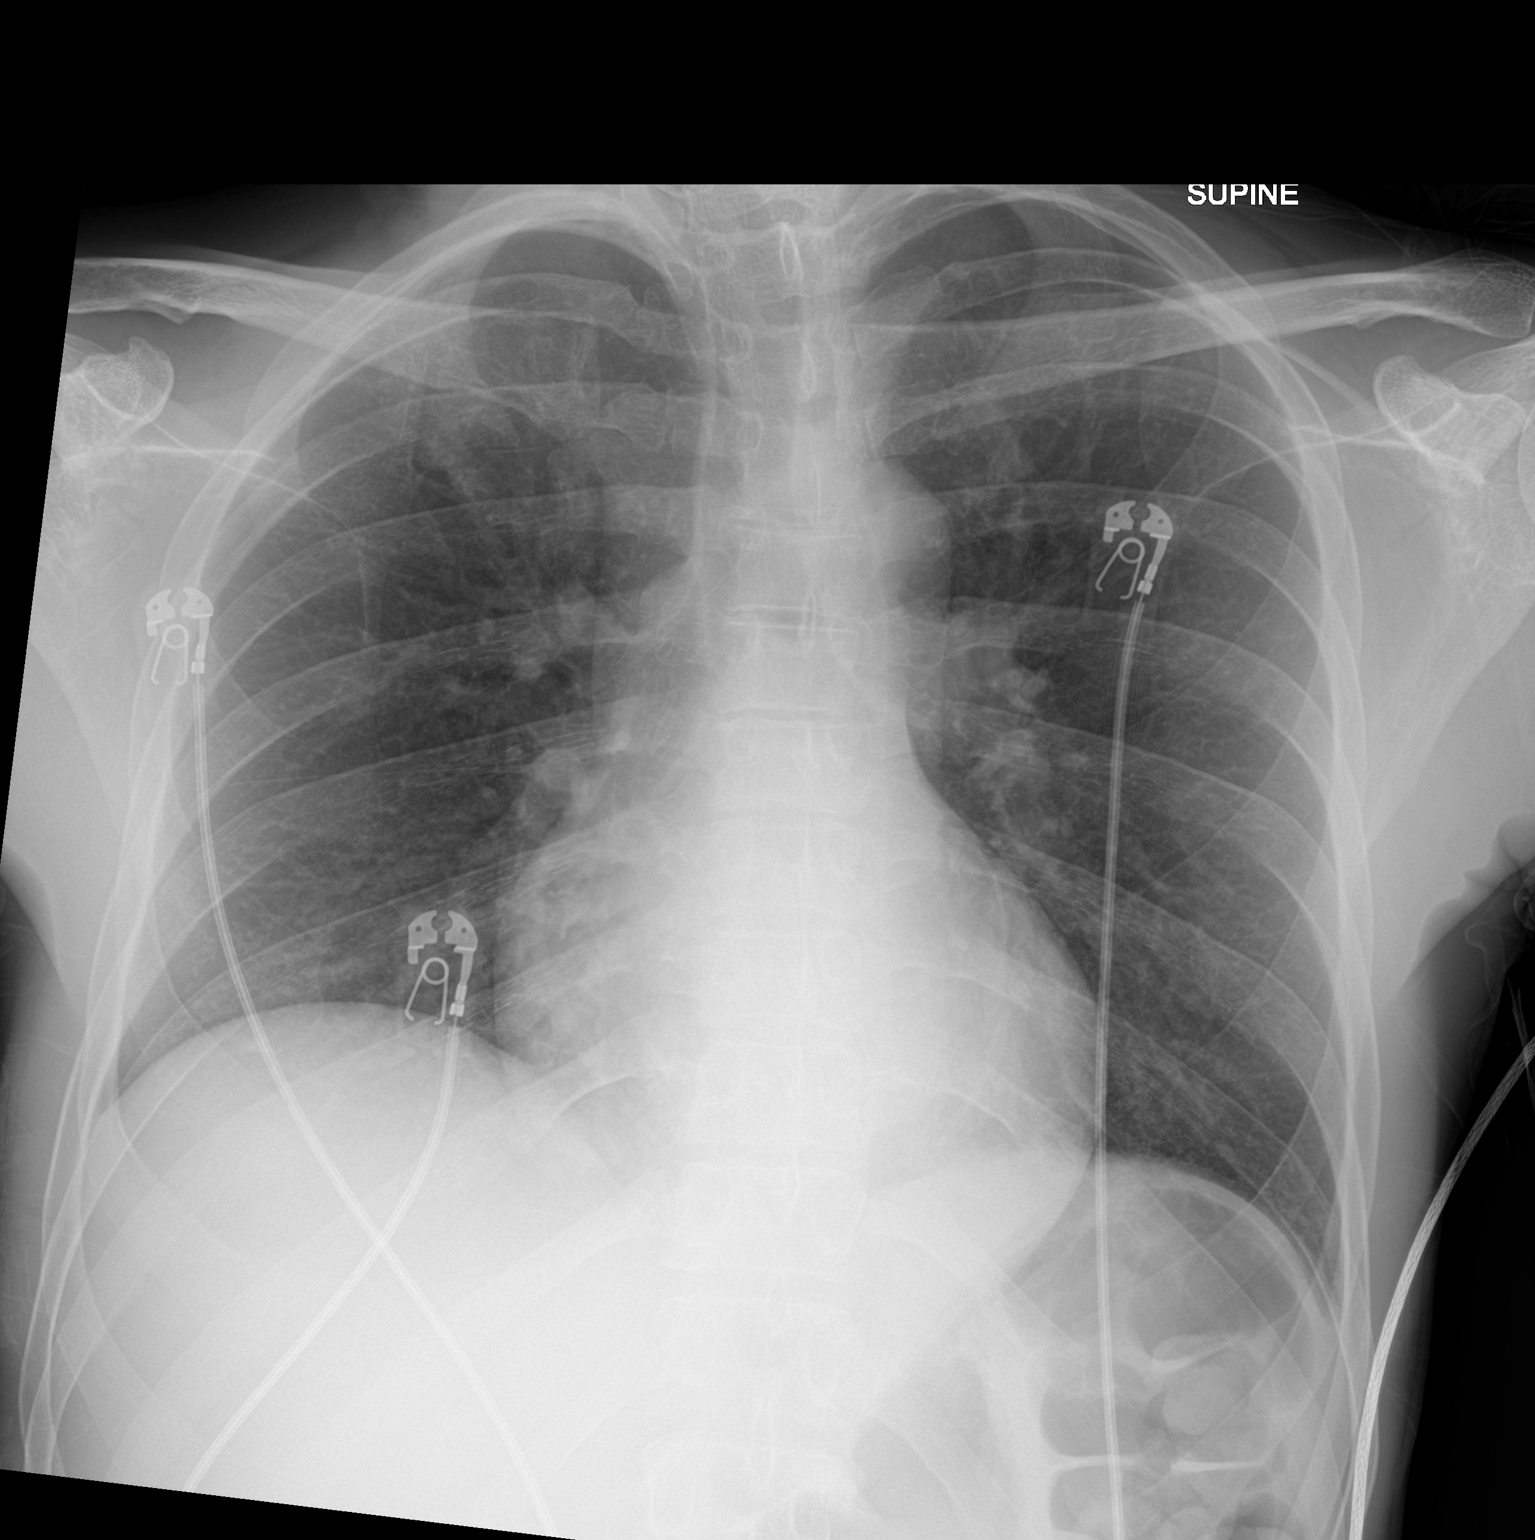

[1 of 1 positions shown; findings below may reference images not displayed]

FINDINGS: Lungs are clear.  No pleural effusion or pneumothorax.

The heart is normal in size.
IMPRESSION: No evidence of acute cardiopulmonary disease.

## 2021-12-14 MED ORDER — HYDROXYZINE HCL 25 MG PO TABS
25.0000 mg | ORAL_TABLET | Freq: Once | ORAL | Status: AC
  Administered 2021-12-15: 25 mg via ORAL
  Filled 2021-12-14: qty 1

## 2021-12-14 MED ORDER — FENTANYL CITRATE PF 50 MCG/ML IJ SOSY
100.0000 ug | PREFILLED_SYRINGE | Freq: Once | INTRAMUSCULAR | Status: AC
  Administered 2021-12-14: 100 ug via INTRAVENOUS
  Filled 2021-12-14: qty 2

## 2021-12-14 MED ORDER — HYDROMORPHONE HCL 2 MG/ML IJ SOLN
2.0000 mg | Freq: Once | INTRAMUSCULAR | Status: AC
  Administered 2021-12-14: 2 mg via INTRAMUSCULAR
  Filled 2021-12-14: qty 1

## 2021-12-14 MED ORDER — VANCOMYCIN HCL IN DEXTROSE 1-5 GM/200ML-% IV SOLN
1000.0000 mg | Freq: Once | INTRAVENOUS | Status: AC
  Administered 2021-12-14: 1000 mg via INTRAVENOUS
  Filled 2021-12-14: qty 200

## 2021-12-14 MED ORDER — SODIUM CHLORIDE 0.9 % IV SOLN
500.0000 mg | INTRAVENOUS | Status: DC
  Administered 2021-12-15 – 2021-12-16 (×2): 500 mg via INTRAVENOUS
  Filled 2021-12-14 (×3): qty 5

## 2021-12-14 MED ORDER — HYDROMORPHONE HCL 1 MG/ML IJ SOLN
1.0000 mg | INTRAMUSCULAR | Status: DC | PRN
  Administered 2021-12-15 (×5): 1 mg via INTRAVENOUS
  Filled 2021-12-14 (×5): qty 1

## 2021-12-14 MED ORDER — VANCOMYCIN HCL 1750 MG/350ML IV SOLN: 1750.0000 mg | INTRAVENOUS | Status: DC

## 2021-12-14 MED ORDER — LACTATED RINGERS IV SOLN: INTRAVENOUS | Status: AC

## 2021-12-14 MED ORDER — MORPHINE SULFATE (PF) 2 MG/ML IV SOLN: 1.0000 mg | INTRAVENOUS | Status: DC | PRN

## 2021-12-14 MED ORDER — OXYCODONE-ACETAMINOPHEN 5-325 MG PO TABS
1.0000 | ORAL_TABLET | Freq: Once | ORAL | Status: AC
  Administered 2021-12-14: 1 via ORAL
  Filled 2021-12-14: qty 1

## 2021-12-14 NOTE — Assessment & Plan Note (Addendum)
Reports last heroin use Stephen Shepard month ago -negative HIV -follow RPR, treponemal ab -follow acute hepatitis panel -> positive hep C ab Started on suboxone 5/20.  Will need to plan for discharge, taper vs follow outpatient with prescriber.

## 2021-12-14 NOTE — ED Triage Notes (Signed)
Pt c/o multiple abscess on left arm, right arm, and right hand.  ? ?C/o severe pain and swelling in right hand.  ? ?10/10 pain  ? ?A/ox4 ?Ambulatory in triage.   ?

## 2021-12-14 NOTE — ED Notes (Signed)
IV insertion attempted by 2 nurses with no success, IVT consult placed, patient and provider updated, will continue to monitor.  ?

## 2021-12-14 NOTE — Assessment & Plan Note (Addendum)
Resolved, follow 

## 2021-12-14 NOTE — ED Provider Triage Note (Signed)
Emergency Medicine Provider Triage Evaluation Note  Stephen Shepard , a 31 y.o. male  was evaluated in triage.  Pt complains of right hand swelling over the last week.  He was given antibiotics at outside hospital.  Continued swelling.  Patient has difficulty ranging at risk due to pain.  He has fusiform swelling from wrist distally.  Large obvious abscess to dorsum of hand.  Friend in room states patient has abscesses on his left upper extremity however he will not remove his jacket.  He has had some chills without any obvious fever.  Redness and warmth to the hand as well. Denies current IVDU, last use 1 month ago.  Review of Systems  Positive: Abscess, chills Negative:   Physical Exam  BP 117/75 (BP Location: Right Arm)   Pulse (!) 103   Temp 98.1 F (36.7 C) (Oral)   Resp 18   Ht 6\' 2"  (1.88 m)   Wt 74.8 kg   SpO2 95%   BMI 21.18 kg/m  Gen:   Awake, no distress   Resp:  Normal effort  MSK:   Large abscess right hand with significant swelling, redness and warmth Other:    Medical Decision Making  Medically screening exam initiated at 4:40 PM.  Appropriate orders placed.  Stephen Shepard was informed that the remainder of the evaluation will be completed by another provider, this initial triage assessment does not replace that evaluation, and the importance of remaining in the ED until their evaluation is complete.  History of IVDU with obvious abscess, tachycardic.   Will need room in back   Mauricia Mertens A, PA-C 12/14/21 1644

## 2021-12-14 NOTE — Progress Notes (Signed)
Pharmacy Antibiotic Note ? ?Stephen Shepard is a 31 y.o. male admitted on 12/14/2021 with rt hand swelling .  Hx of IV drug use Pharmacy has been consulted to dose vancomycin for cellulitis ? ?Plan: ?Vancomycin 1 gm IV x 1 then 1750mg  q24h (AUC 522.1, Scr 1.61, TBW) ?Follow renal function, cultures and clinical course ? ? ?Height: 6\' 2"  (188 cm) ?Weight: 74.8 kg (165 lb) ?IBW/kg (Calculated) : 82.2 ? ?Temp (24hrs), Avg:98.1 ?F (36.7 ?C), Min:98.1 ?F (36.7 ?C), Max:98.1 ?F (36.7 ?C) ? ?Recent Labs  ?Lab 12/14/21 ?1640 12/14/21 ?2250  ?WBC 15.7*  --   ?CREATININE 1.61*  --   ?LATICACIDVEN 1.3 0.5  ?  ?Estimated Creatinine Clearance: 69.7 mL/min (A) (by C-G formula based on SCr of 1.61 mg/dL (H)).   ? ?Allergies  ?Allergen Reactions  ? Ibuprofen Anaphylaxis  ? Other Anaphylaxis  ?  Kepra  ? Wasp Venom Protein Swelling  ?  hives  ? ? ?Antimicrobials this admission: ?5/17 vanc >> ?5/18 azith >> ? ?Dose adjustments this admission: ? ? ?Microbiology results: ?5/17 BCx:  ? ? ?Thank you for allowing pharmacy to be a part of this patient?s care. ? ?6/18 RPh ?12/14/2021, 11:50 PM ? ?

## 2021-12-14 NOTE — H&P (Signed)
?History and Physical  ? ? ?Patient: Stephen Shepard [id number] DOB: 1990-11-11 ?DOA: 12/14/2021 ?DOS: the patient was seen and examined on 12/14/2021 ?PCP: Patient, [name] (Inactive)  ?Patient coming from: Home ? ?Chief Complaint:  ?Chief Complaint  ?Patient presents with  ? Abscess  ? ?HPI: Indio Samp is a 31 y.o. male with medical history significant of seizure not on antiepileptics and history of IV heroin drug use who presents with concerns of worsening abscess. ? ?Reports that over the past 2 days he has had numerous abscesses forming on bilateral upper extremity.  Reports previously he was treated with Augmentin for presumed sinusitis.  He denies any trauma to his extremity but thinks maybe he was scratched by his girlfriend's cat.  Unclear if he had fever since he has been taking Tylenol for the pain.  Reports last IV heroin drug use about a month ago.  Denies any chest pain but is feeling some shortness of breath since being in the ED.  States he has been under tremendous stress due to the passing of both of her parents in the past few years and hurting his back on the job recently. ? ?In the ED, he was afebrile but tachycardic with leukocytosis of 15.7 K.  Lactate was normal.  Creatinine showed AKI at 1.61. ? ?Hand and wrist x-ray showed no bony findings but had massive soft tissue swelling to the dorsal hand.  ED PA consulted hand surgery Dr. Caralyn Guile who recommended he be kept NPO.  ? ?He was started on IV vancomycin and hospitalist was called for admission. ? ? ?Review of Systems: As mentioned in the history of present illness. All other systems reviewed and are negative. ?Past Medical History:  ?Diagnosis Date  ? Seizures ([place])   ? ?History reviewed. No pertinent surgical history. ?Social History:  reports that he has been smoking cigarettes. He has been smoking an average of 1 pack per day. He has never used smokeless tobacco. He reports that he does not drink alcohol and does not use  drugs. ? ?Allergies  ?Allergen Reactions  ? Ibuprofen Anaphylaxis  ? Other Anaphylaxis  ?  Kepra  ? Wasp Venom Protein Swelling  ?  hives  ? ? ?No family history on file. ? ?Prior to Admission medications   ?Medication Sig Start Date End Date Taking? Authorizing Provider  ?acetaminophen (TYLENOL) 500 MG tablet Take 500 mg by mouth every 6 (six) hours as needed for moderate pain.   Yes [provider]  ?amoxicillin-clavulanate (AUGMENTIN) 875-125 MG tablet Take 1 tablet by mouth every 12 (twelve) hours. ?Patient not taking: Reported on 12/14/2021 04/22/21   Loni Beckwith, PA-C  ?lidocaine (LIDODERM) 5 % Place 1 patch onto the skin daily. Remove & Discard patch within 12 hours or as directed by MD ?Patient not taking: Reported on 12/14/2021 09/29/21   Lacretia Leigh, MD  ?methocarbamol (ROBAXIN) 500 MG tablet Take 2 tablets (1,000 mg total) by mouth 2 (two) times daily. ?Patient not taking: Reported on 12/14/2021 09/29/21   Lacretia Leigh, MD  ? ? ?Physical Exam: ?Vitals:  ? 12/14/21 1622 12/14/21 1905  ?BP: 117/75 124/77  ?Pulse: (!) 103 87  ?Resp: 18 18  ?Temp: 98.1 ?F (36.7 ?C)   ?TempSrc: Oral   ?SpO2: 95% 100%  ?Weight: 74.8 kg   ?Height: 6\' 2"  (1.88 m)   ? ?Constitutional: NAD, calm, comfortable, young male lying flat in bed ?Eyes: lids and conjunctivae normal ?ENMT: Mucous membranes are moist.  ?Neck: normal, supple,  ?  Respiratory: clear to auscultation bilaterally, no wheezing, no crackles. Normal respiratory effort. No accessory muscle use.  ?Cardiovascular: Regular rate and rhythm, no murmurs / rubs / gallops. No extremity edema.  ?Abdomen: no tenderness, no masses palpated. Bowel sounds positive.  ?Musculoskeletal: no clubbing / cyanosis. No joint deformity upper and lower extremities. Good ROM, no contractures. Normal muscle tone.  ?Skin: Large round fluctuant indurated abscess noted on proximal lateral right shoulder, open and draining purulent abscess to right distal forearm.  Large firm abscess  on dorsal right hand with edema of the entire hand and difficulty with finger flexion and extension. ?Ruptured abscess on left forearm and adjacent to left decubitus region. ?Neurologic: CN 2-12 grossly intact. Strength 5/5 in all 4.  ?Psychiatric: Normal judgment and insight. Alert and oriented x 3.  Later tearful when discussing admission to the hospital. ?Data Reviewed: ? ?See HPI ? ?Assessment and Plan: ?* Cellulitis ?Sepsis ?Presented with tachycardia and leukocytosis. ?Presented with abscess to right proximal shoulder, right dorsal hand and distal right forearm.  Also has multiple abscesses left forearm. ?-Hand surgeon has been consulted by ED PA regarding large abscess to distal right hand.  Will be kept n.p.o. at midnight and they will evaluate in the morning. ?-Also will obtain ultrasound of large abscess to right proximal shoulder with gram stain ordered.  ?-Continue with IV vancomycin.  Patient did also mention possible cat scratch, will add on IV azithromycin ?-will check hbA1C although only has mild hyperglycemia here ? ?AKI (acute kidney injury) ([place]) ?Creatinine elevated to 1.61.  Continuous IV fluid overnight and will monitor in the morning. ? ?IVDU (intravenous drug user) ?Reports last heroine use a month ago ?-check HIV given new infection ?-check CXR for dyspnea. Blood culture is pending although low concerns of endocarditis at this time.  ? ? ? ? ? Advance Care Planning:   Code Status: Full Code  ? ?Consults: none ? ?Family Communication: no family at bedside ? ?Severity of Illness: ?The appropriate patient status for this patient is INPATIENT. Inpatient status is judged to be reasonable and necessary in order to provide the required intensity of service to ensure the patient's safety. The patient's presenting symptoms, physical exam findings, and initial radiographic and laboratory data in the context of their chronic comorbidities is felt to place them at high risk for further clinical  deterioration. Furthermore, it is not anticipated that the patient will be medically stable for discharge from the hospital within 2 midnights of admission.  ? ?* I certify that at the point of admission it is my clinical judgment that the patient will require inpatient hospital care spanning beyond 2 midnights from the point of admission due to high intensity of service, high risk for further deterioration and high frequency of surveillance required.* ? ?Author: Orene Desanctis, DO ?12/14/2021 11:57 PM ? ?For on call review [email or web address].  ?

## 2021-12-14 NOTE — ED Provider Notes (Signed)
[facility] DEPT Provider Note   CSN: [id number] Arrival date & time: 12/14/21  1616     History  Chief Complaint  Patient presents with   Abscess    Cavon Smithart is a 31 y.o. male.  Patient presents to the ED with complaint of right hand swelling, onset one week ago. He has reportedly been on clindamycin for sinus infection. Swelling in hand has increased, extending from the wrist and involving the fingers. Area is warm and erythematous. Chills but no current fever. He is tachycardic. History of IV drug use, last usage one month ago. He has been taking tylenol without improvement in pain. No known recent injury to hand. He is unable to range his wrist or fingers due to pain.  The history is provided by the patient and a friend. No language interpreter was used.  Abscess Location:  Hand Hand abscess location:  Dorsum of R hand Abscess quality: induration, painful, redness and warmth   Duration:  1 week Progression:  Worsening Pain details:    Quality:  Tightness, pressure and throbbing   Timing:  Constant   Progression:  Worsening Chronicity:  New     Home Medications Prior to Admission medications   Medication Sig Start Date End Date Taking? Authorizing Provider  amoxicillin-clavulanate (AUGMENTIN) 875-125 MG tablet Take 1 tablet by mouth every 12 (twelve) hours. 04/22/21   Loni Beckwith, PA-C  clonazePAM (KLONOPIN PO) Take by mouth.    [provider]  lidocaine (LIDODERM) 5 % Place 1 patch onto the skin daily. Remove & Discard patch within 12 hours or as directed by MD 09/29/21   Lacretia Leigh, MD  methocarbamol (ROBAXIN) 500 MG tablet Take 2 tablets (1,000 mg total) by mouth 2 (two) times daily. 09/29/21   Lacretia Leigh, MD      Allergies    Ibuprofen and Other    Review of Systems   Review of Systems  Skin:  Positive for color change.  All other systems reviewed and are negative.  Physical Exam Updated Vital Signs BP  117/75 (BP Location: Right Arm)   Pulse (!) 103   Temp 98.1 F (36.7 C) (Oral)   Resp 18   Ht 6\' 2"  (1.88 m)   Wt 74.8 kg   SpO2 95%   BMI 21.18 kg/m  Physical Exam Vitals and nursing note reviewed.  Constitutional:      Appearance: Normal appearance.  HENT:     Head: Normocephalic.     Nose: Nose normal.  Cardiovascular:     Rate and Rhythm: Tachycardia present.  Pulmonary:     Effort: Pulmonary effort is normal.     Breath sounds: Normal breath sounds.  Abdominal:     Palpations: Abdomen is soft.  Musculoskeletal:        General: Swelling and tenderness present. No signs of injury.     Right hand: Swelling and tenderness present. Decreased range of motion.     Cervical back: Normal range of motion.  Skin:    General: Skin is warm and dry.  Neurological:     Mental Status: He is alert and oriented to person, place, and time.  Psychiatric:        Mood and Affect: Mood normal.        Behavior: Behavior normal.     ED Results / Procedures / Treatments   Labs (all labs ordered are listed, but only abnormal results are displayed) Labs Reviewed  CBC WITH DIFFERENTIAL/PLATELET - Abnormal;  Notable for the following components:      Result Value   WBC 15.7 (*)    Platelets 435 (*)    Neutro Abs 12.3 (*)    All other components within normal limits  BASIC METABOLIC PANEL - Abnormal; Notable for the following components:   Glucose, Bld 111 (*)    BUN 21 (*)    Creatinine, Ser 1.61 (*)    GFR, Estimated 58 (*)    All other components within normal limits  CULTURE, BLOOD (ROUTINE X 2)  CULTURE, BLOOD (ROUTINE X 2)  LACTIC ACID, PLASMA  LACTIC ACID, PLASMA    EKG None  Radiology DG Wrist Complete Right  Result Date: 12/14/2021 CLINICAL DATA:  Right wrist pain and swelling. EXAM: RIGHT WRIST - COMPLETE 3+ VIEW COMPARISON:  None Available. FINDINGS: The joint spaces are maintained. No acute bony findings or destructive bony changes. IMPRESSION: No acute bony  findings. Electronically Signed   By: Marijo Sanes M.D.   On: 12/14/2021 17:47   DG Hand Complete Right  Result Date: 12/14/2021 CLINICAL DATA:  Pain and swelling. EXAM: RIGHT HAND - COMPLETE 3+ VIEW COMPARISON:  None Available. FINDINGS: The joint spaces are maintained. No acute bony findings or destructive bony changes. There is massive soft tissue swelling noted on the dorsum of the hand. No gas is seen in the soft tissues. No radiopaque foreign body. IMPRESSION: Massive soft tissue swelling but no acute bony findings. Electronically Signed   By: Marijo Sanes M.D.   On: 12/14/2021 17:45    Procedures Procedures    Medications Ordered in ED Medications  fentaNYL (SUBLIMAZE) injection 100 mcg (has no administration in time range)  vancomycin (VANCOCIN) IVPB 1000 mg/200 mL premix (has no administration in time range)    ED Course/ Medical Decision Making/ A&P  Patient with significant swelling and potential abscess to the dorsum of the right hand. Elevated white count of 15.7 and mildly tachycardic. Minimally elevated BUN and creatinine. Will need admission for IV antibiotics. Consult with hand ([place])--requests NPO after midnight, will see in the morning. Admission requested to hospitalist.                         Medical Decision Making Risk Prescription drug management. Decision regarding hospitalization.           Final Clinical Impression(s) / ED Diagnoses Final diagnoses:  Cellulitis and abscess of hand    Rx / DC Orders ED Discharge Orders     None         Etta Quill, NP 12/14/21 2130    Gareth Morgan, MD 12/15/21 1026

## 2021-12-14 NOTE — Assessment & Plan Note (Addendum)
Sepsis related to multiple abscesses, tachycardic, leukocytosis at presentation -> ruled in R hand abscess and R shoulder abscess.  Several other locations appear to have spontaneously drained.  Several on L arm that appear to have spontaneously drained/improved.  [place] with heterogenous collection along dorsum of R hand and small collection in the anterior R shoulder S/p right shoulder incision and drainage subcutaneous abscess, R wrist and dorsum of the hand drainage of deep abscess, right hand fourth dorsal compartment tenosynovectomy, right hand third dorsal compartment tenosynovectomy, right hand second dorsal compartment tenosynovectomy on 5/18 Recommending daily packing changes to wound on shoulder and dry dressing on incision For R wrist and forearm, recommending keep splint in place and they'll see him back on Monday Follow operative cultures -> hand abscess with strep intermedius, haemophilus parainfluenzae, beta lactamase negative.  Shoulder abscess with strep intermedius. Blood cx growing candida glabrata Repeat cultures 5/22 pending collection (will try again, difficult stick) Appreciate ID recs, recommending change to unasyn Suspect related to most likely related to IVDU - close attention to blood cultures given multiple abscesses.

## 2021-12-14 NOTE — Progress Notes (Signed)
Went to do nursing admission history. Explained what I was doing and when I asked pt his birthday he told me "I already did this. I can't do this." Unable to do nursing admission hx.   ?TDarcel Smalling BSN, RN-BC ?Admissions RN ?12/14/2021 ?8:23 PM ? ?

## 2021-12-14 NOTE — ED Provider Notes (Signed)
Ultrasound ED Peripheral IV (Provider) ? ?Date/Time: 12/14/2021 10:52 PM ?Performed by: Sloan Leiter, DO ?Authorized by: Sloan Leiter, DO  ? ?Procedure details:  ?  Indications: multiple failed IV attempts and poor IV access   ?  Skin Prep: chlorhexidine gluconate   ?  Location:  Left AC ?  Angiocath:  18 G ?  Bedside Ultrasound Guided: Yes   ?  Images: not archived   ?  Patient tolerated procedure without complications: Yes   ?  Dressing applied: Yes   ? ?  ?Sloan Leiter, DO ?12/14/21 2252 ? ?

## 2021-12-15 ENCOUNTER — Inpatient Hospital Stay (HOSPITAL_COMMUNITY): Payer: Self-pay | Admitting: Certified Registered Nurse Anesthetist

## 2021-12-15 ENCOUNTER — Encounter (HOSPITAL_COMMUNITY): Payer: Self-pay | Admitting: Family Medicine

## 2021-12-15 ENCOUNTER — Inpatient Hospital Stay (HOSPITAL_COMMUNITY): Payer: Self-pay

## 2021-12-15 ENCOUNTER — Encounter (HOSPITAL_COMMUNITY)
Admission: EM | Disposition: A | Discharge status: Home / Self Care | Payer: Self-pay | Source: Home / Self Care | Attending: Family Medicine

## 2021-12-15 ENCOUNTER — Other Ambulatory Visit: Payer: Self-pay

## 2021-12-15 DIAGNOSIS — L0291 Cutaneous abscess, unspecified: Secondary | ICD-10-CM

## 2021-12-15 DIAGNOSIS — R0602 Shortness of breath: Secondary | ICD-10-CM

## 2021-12-15 DIAGNOSIS — L02413 Cutaneous abscess of right upper limb: Secondary | ICD-10-CM

## 2021-12-15 DIAGNOSIS — M549 Dorsalgia, unspecified: Secondary | ICD-10-CM

## 2021-12-15 HISTORY — PX: I & D EXTREMITY: SHX5045

## 2021-12-15 LAB — HEMOGLOBIN A1C
Hgb A1c MFr Bld: 6 % — ABNORMAL HIGH (ref 4.8–5.6)
Mean Plasma Glucose: 125.5 mg/dL

## 2021-12-15 LAB — BASIC METABOLIC PANEL
Anion gap: 7 (ref 5–15)
BUN: 15 mg/dL (ref 6–20)
CO2: 29 mmol/L (ref 22–32)
Calcium: 9.2 mg/dL (ref 8.9–10.3)
Chloride: 103 mmol/L (ref 98–111)
Creatinine, Ser: 1 mg/dL (ref 0.61–1.24)
GFR, Estimated: >60 mL/min (ref >60)
Glucose, Bld: 114 mg/dL — ABNORMAL HIGH (ref 70–99)
Potassium: 4.1 mmol/L (ref 3.5–5.1)
Sodium: 139 mmol/L (ref 135–145)

## 2021-12-15 LAB — HIV ANTIBODY (ROUTINE TESTING W REFLEX): HIV Screen 4th Generation wRfx: NONREACTIVE

## 2021-12-15 LAB — CBC
HCT: 38.1 % — ABNORMAL LOW (ref 39.0–52.0)
Hemoglobin: 12.7 g/dL — ABNORMAL LOW (ref 13.0–17.0)
MCH: 28.3 pg (ref 26.0–34.0)
MCHC: 33.3 g/dL (ref 30.0–36.0)
MCV: 85 fL (ref 80.0–100.0)
Platelets: 369 10*3/uL (ref 150–400)
RBC: 4.48 MIL/uL (ref 4.22–5.81)
RDW: 13.8 % (ref 11.5–15.5)
WBC: 15 10*3/uL — ABNORMAL HIGH (ref 4.0–10.5)
nRBC: 0 % (ref 0.0–0.2)

## 2021-12-15 LAB — SURGICAL PCR SCREEN
MRSA, PCR: NEGATIVE
Staphylococcus aureus: NEGATIVE

## 2021-12-15 IMAGING — CR DG THORACIC SPINE 2V
2 series · 2 of 2 positions shown · non-contrast
Comparison: Chest CT [DATE].

CLINICAL DATA: Provided history: Upper back pain since [DATE].

EXAM:
THORACIC SPINE 2 VIEWS

[t thoracic spine ap (1 of 2)]
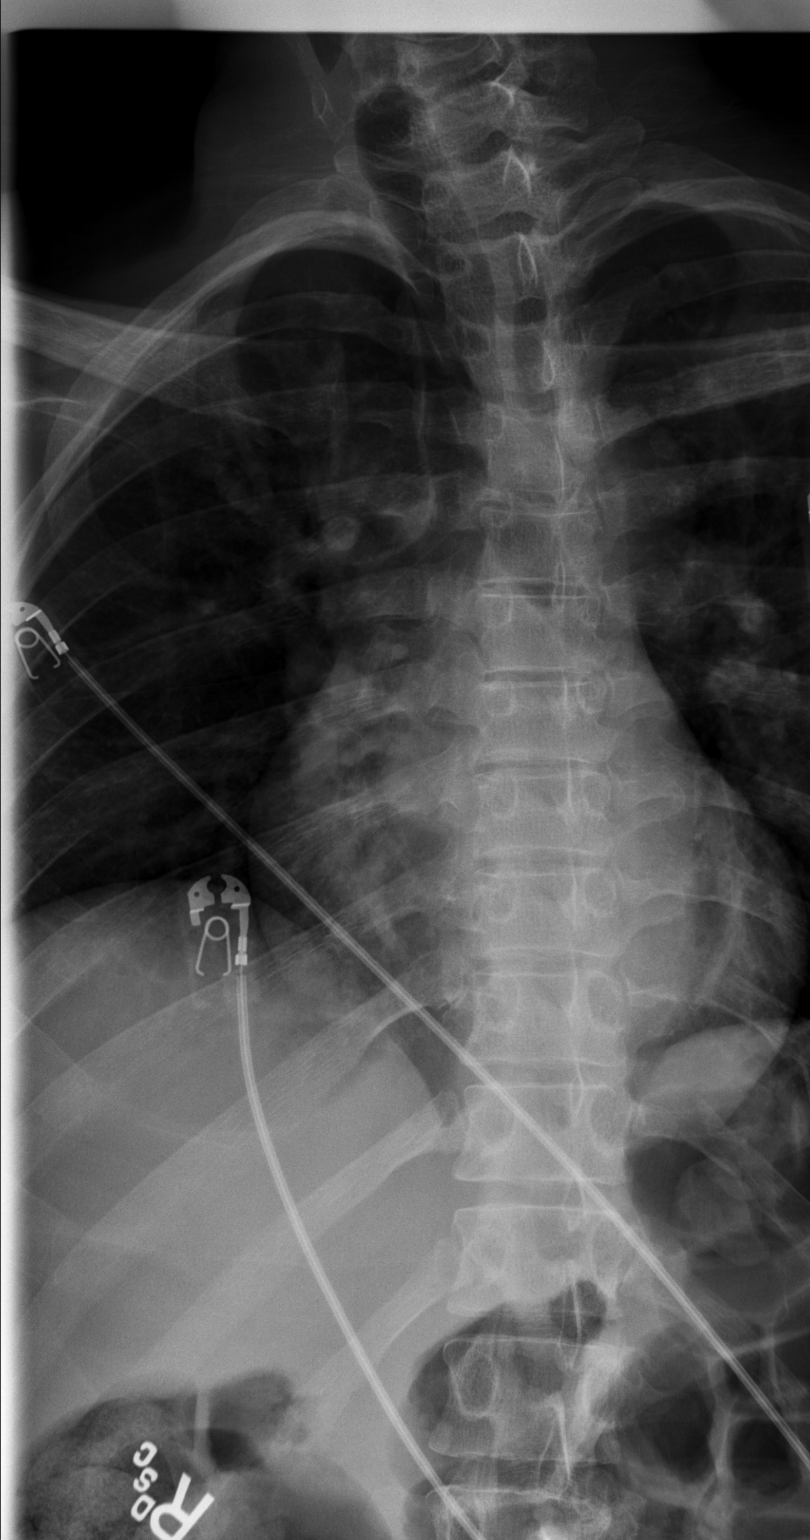

[t thoracic spine ap (2 of 2)]
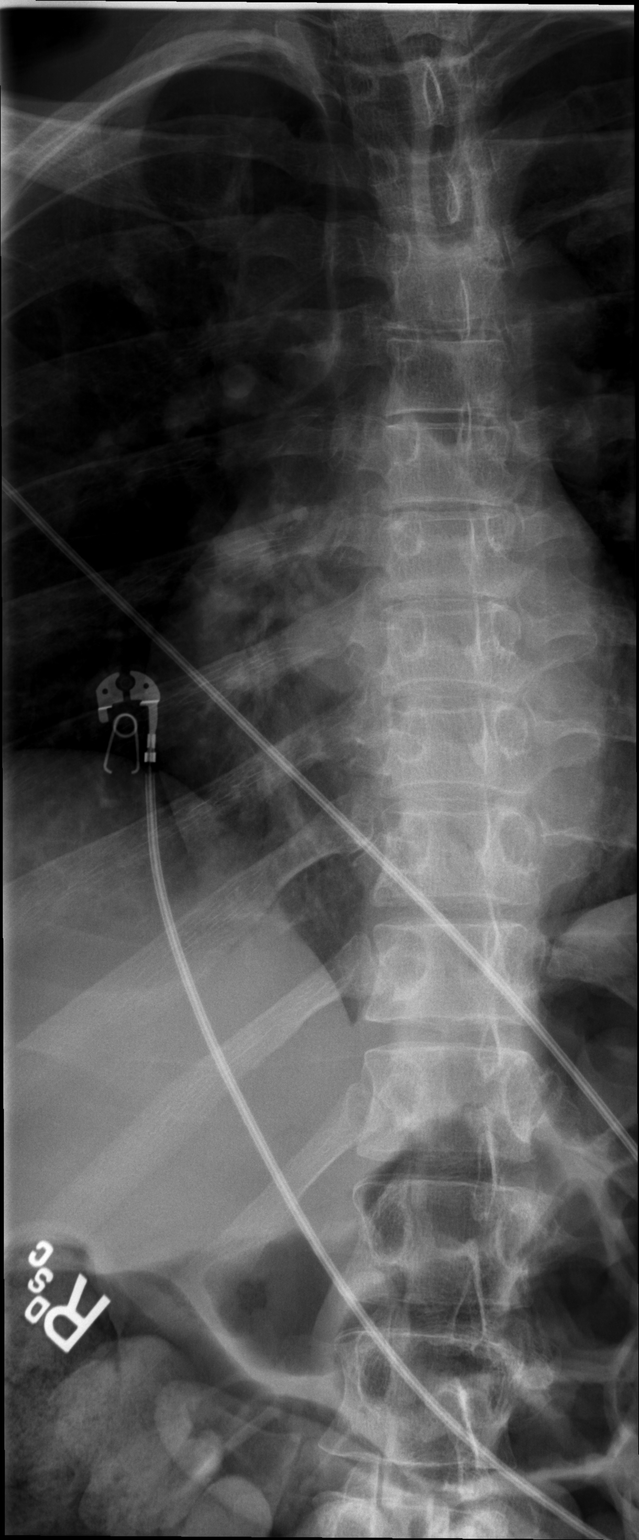

[2 of 2 positions shown; findings below may reference images not displayed]

FINDINGS: The patient was unable to tolerate lateral view radiographs, and
only AP view radiographs could be acquired. This significantly
limits evaluation. Levocurvature of the upper thoracic and lower
cervical spine. Within described limitations, there is no
appreciable thoracic vertebral compression fracture.
IMPRESSION: The patient was unable to tolerate lateral view radiographs,
significantly limiting evaluation.

No appreciable thoracic vertebral compression fracture on the
acquired AP view radiographs.

Levocurvature of the upper thoracic and lower cervical spine.

## 2021-12-15 IMAGING — US US EXTREM UP *R* LTD
1 series · 15 of 25 positions shown · non-contrast
Comparison: None Available.

CLINICAL DATA: 30-year-old with right upper extremity abscesses.

EXAM:
ULTRASOUND RIGHT UPPER EXTREMITY LIMITED
TECHNIQUE: Ultrasound examination of the upper extremity soft tissues was
performed in the area of clinical concern.

[Series 1: us abscess drain mc & wl · 15 of 30 slices shown]
[im 1/30]
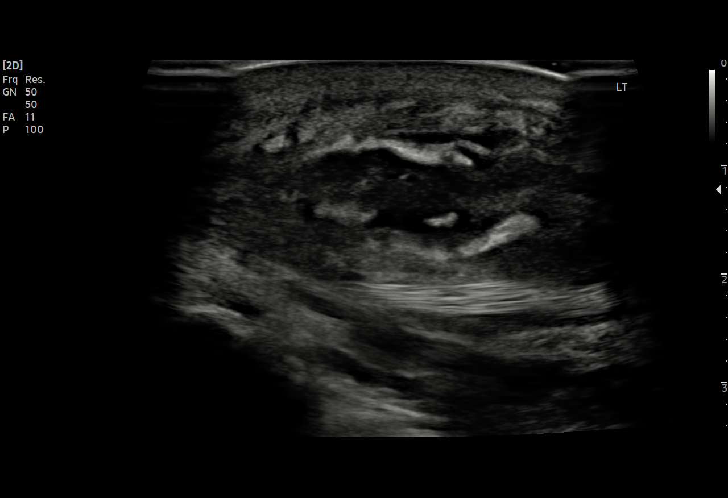
[im 3/30]
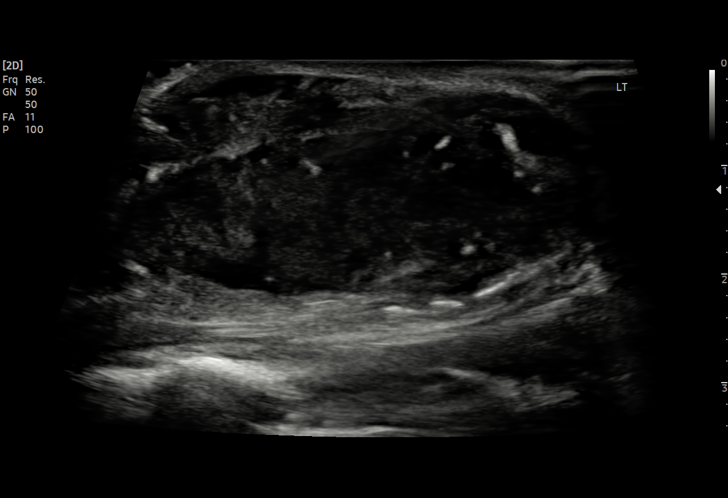
[im 5/30]
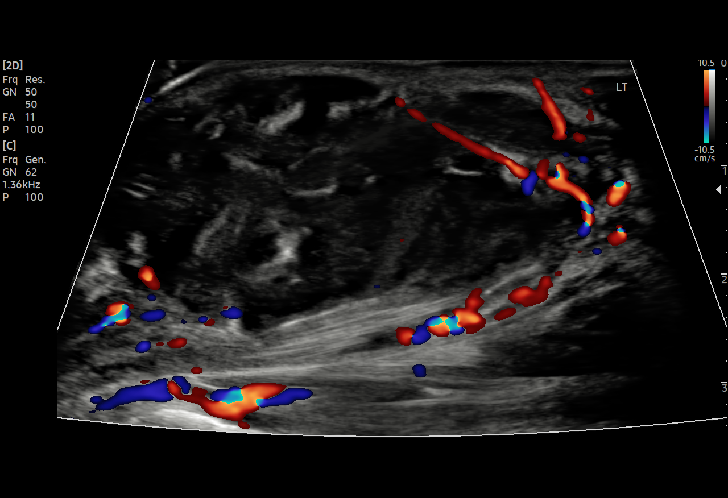
[im 7/30]
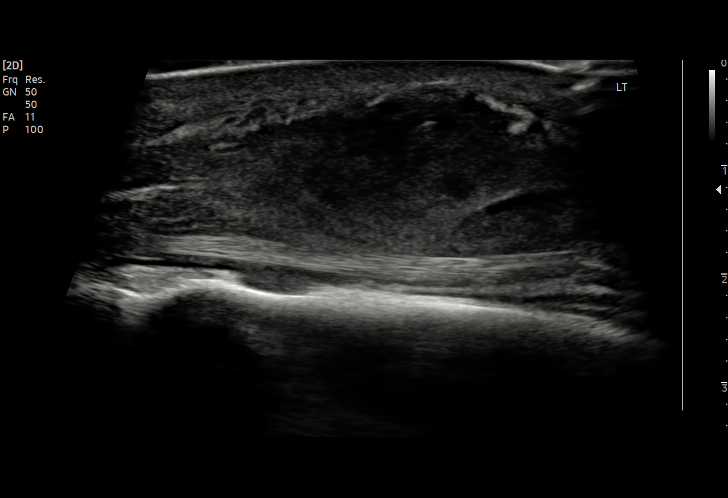
[im 9/30]
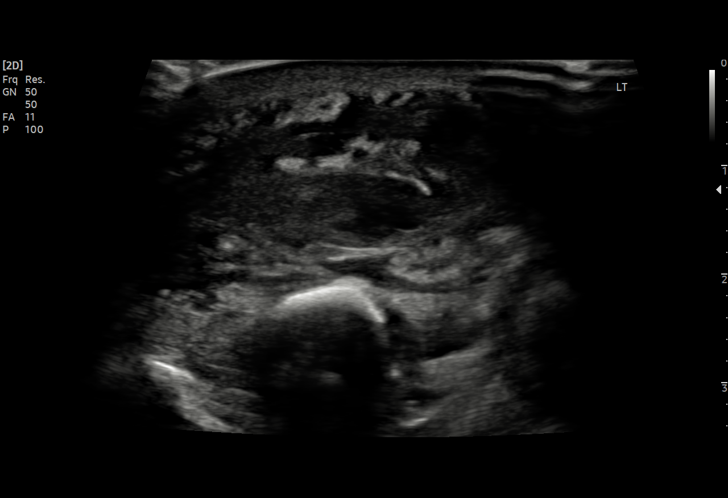
[im 11/30]
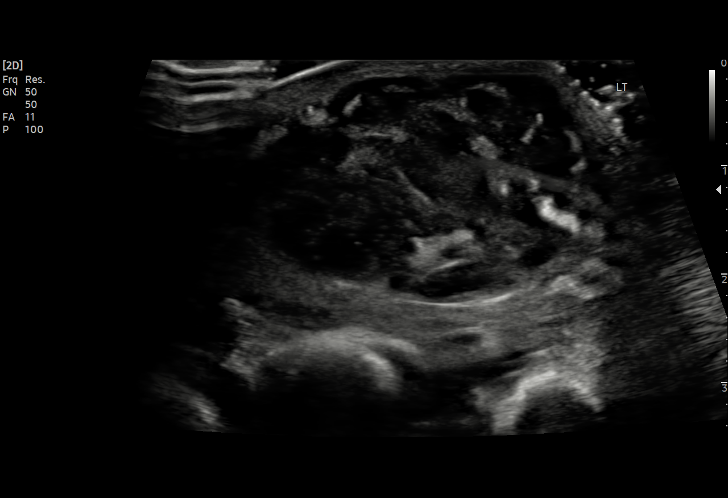
[im 13/30]
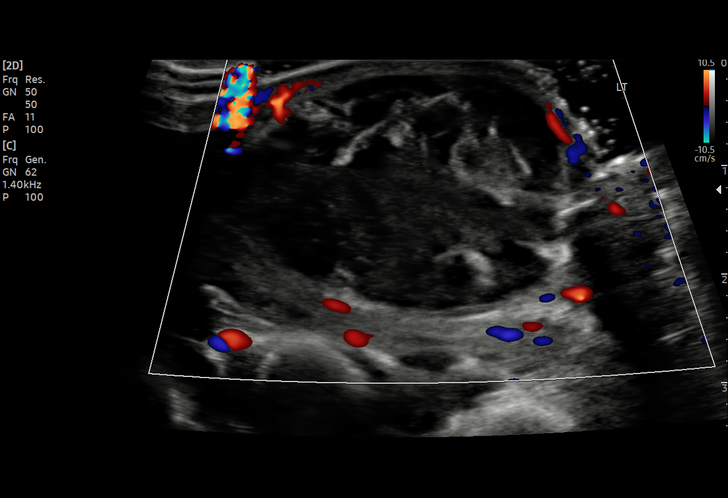
[im 15/30]
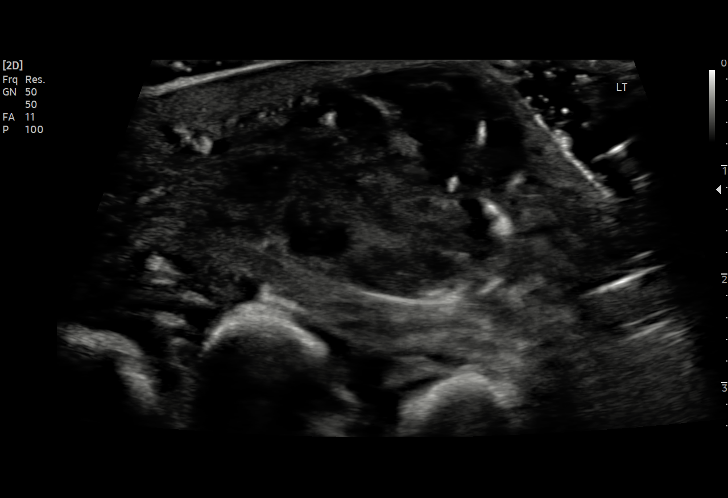
[im 17/30]
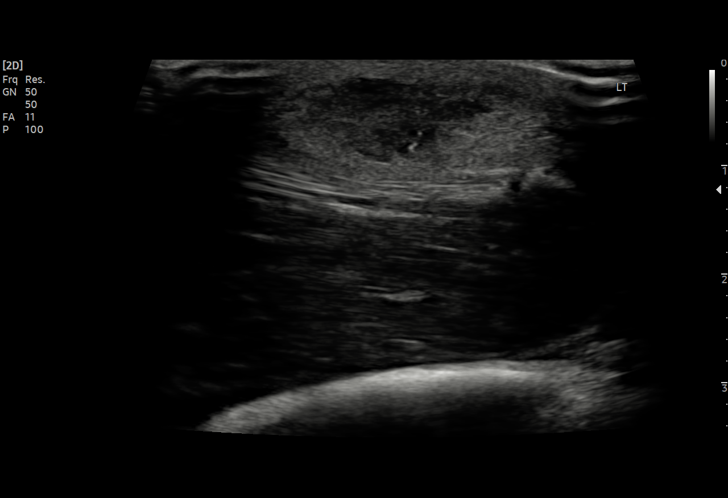
[im 19/30]
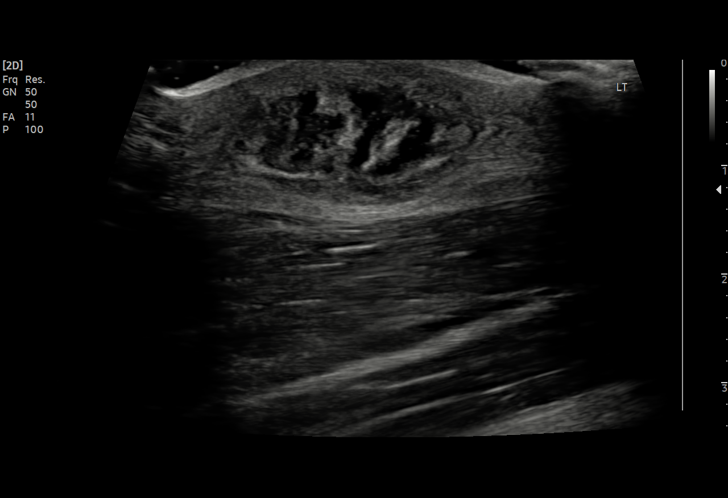
[im 21/30]
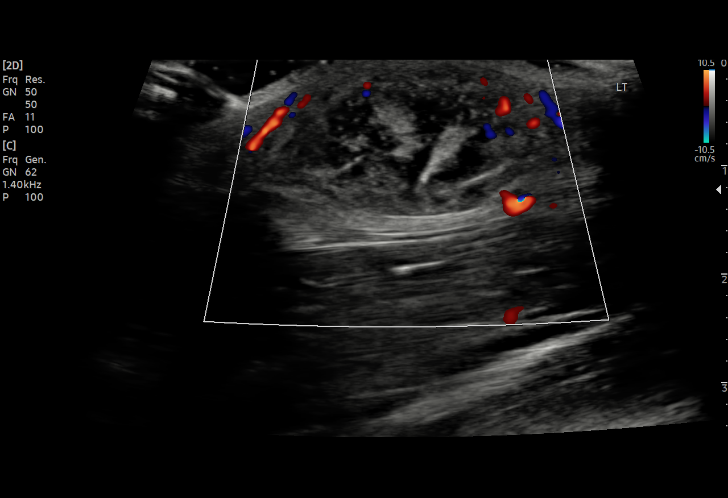
[im 23/30]
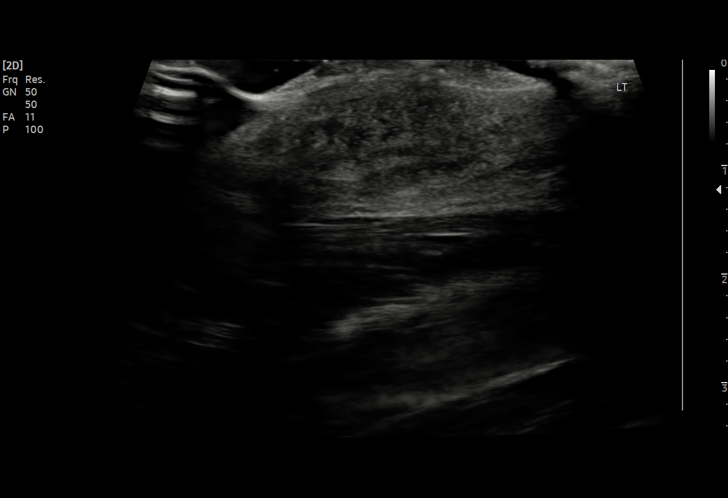
[im 25/30]
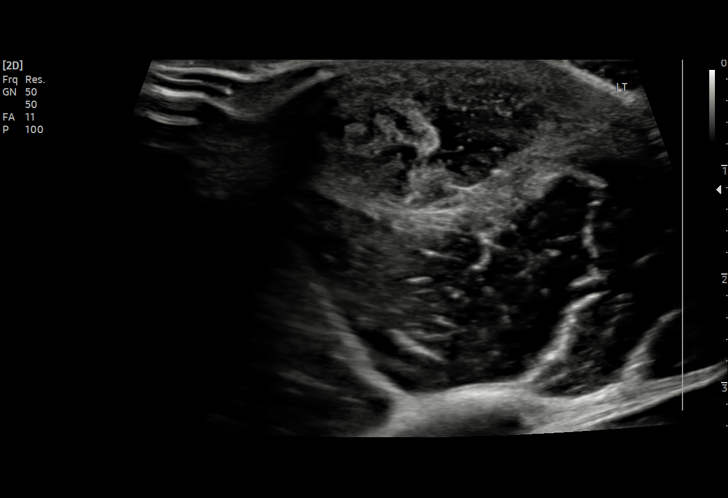
[im 27/30]
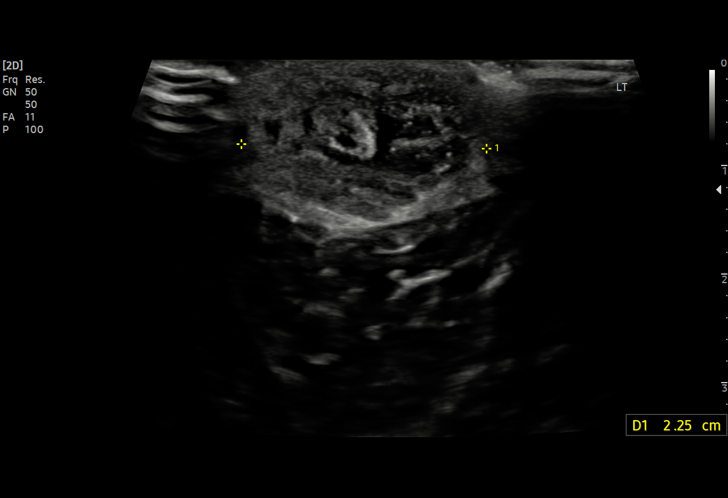
[im 30/30]
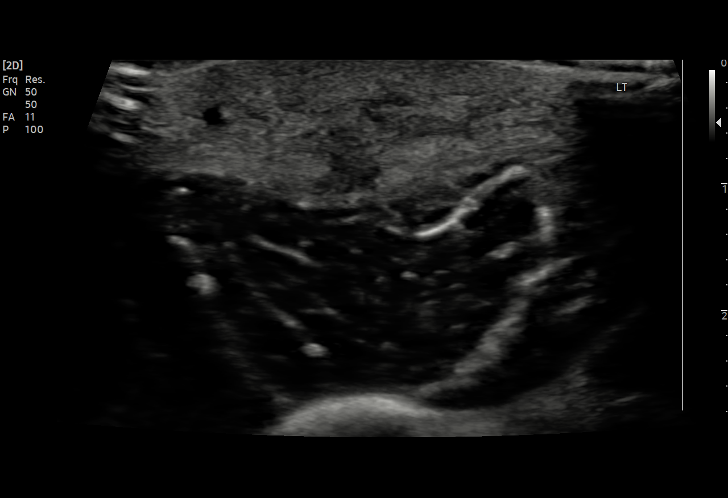

[15 of 25 positions shown; findings below may reference images not displayed]

FINDINGS: Complex hypoechoic heterogeneous structure in the dorsum of the
right hand. This is most compatible with a complex fluid collection
and abscess. The right hand structure measures 4.4 x 2.0 x 3.6 cm.
Structure has peripheral blood flow but no significant internal
vascularity.

There is a smaller heterogeneous complex fluid collection in the
anterior right shoulder region. Right shoulder structure measures
2.5 x 1.3 x 2.3 cm. This shoulder collection is superficial with
peripheral vascularity.
IMPRESSION: Heterogeneous collection along the dorsum of the right hand and
smaller collection in the anterior right shoulder. Findings are most
compatible with superficial abscess collections.

## 2021-12-15 IMAGING — CT CT T SPINE W/O CM
4 of 10 series · 10 of 33 positions shown, 11 images · IV contrast (OMNIPAQUE)
Comparison: CT scan of the thoracic spine

CLINICAL DATA: Chest pain, shortness of breath with pleuritic chest
pain

EXAM:
CT ANGIOGRAPHY CHEST WITH CONTRAST
TECHNIQUE: Multidetector CT imaging of the chest was performed using the
standard protocol during bolus administration of intravenous
contrast. Multiplanar CT image reconstructions and MIPs were
obtained to evaluate the vascular anatomy.

[Series 13: axial thin tspine bone · axial · 0.38mm/px · z∈[+1582,+1689]mm · 2 of 535 slices shown, 3 images (1 of 2)]
[im 179/535  soft-tissue]
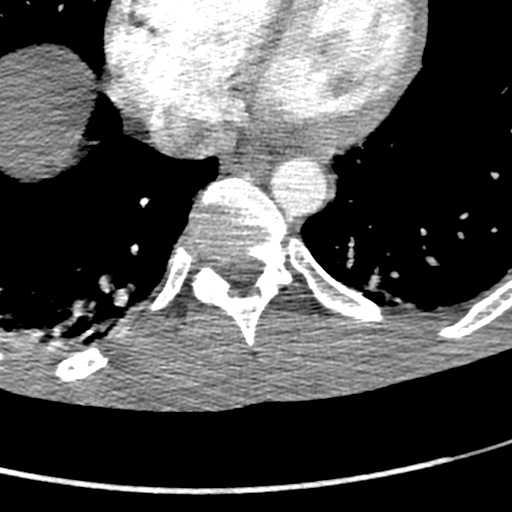
[im 179/535  bone]
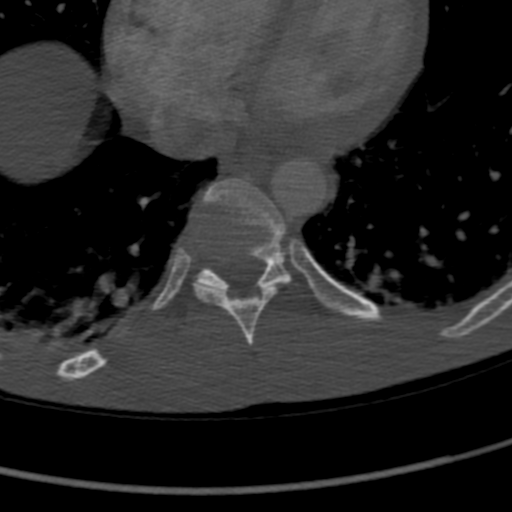
[im 357/535  bone]
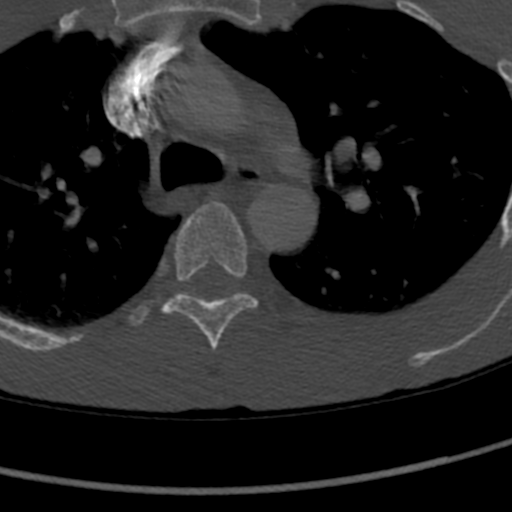

[Series 14: axial thin tspine bone · axial · 0.38mm/px · z∈[+1582,+1689]mm · 2 of 535 slices shown (2 of 2)]
[im 179/535  bone]
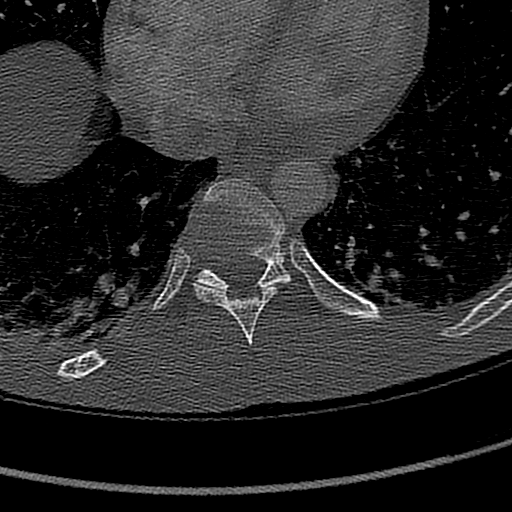
[im 357/535  bone]
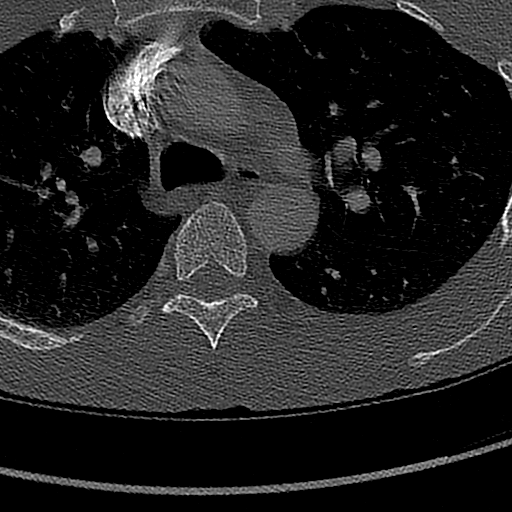

[Series 602: coronal · coronal · 0.63mm/px · 1 of 78 slices shown]
[im 39/78  bone]
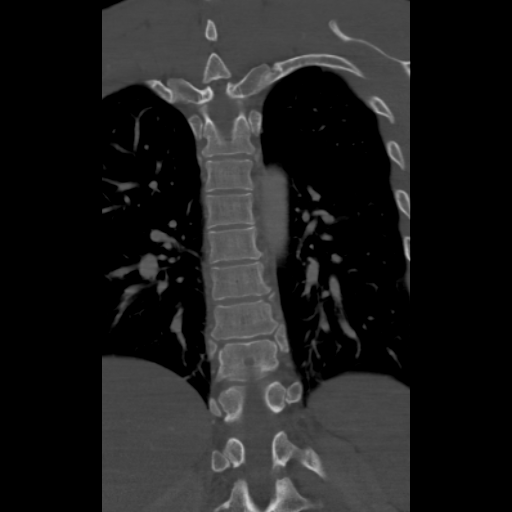

[Series 603: st sagittal · sagittal · 0.63mm/px · 5 of 82 slices shown]
[im 14/82  bone]
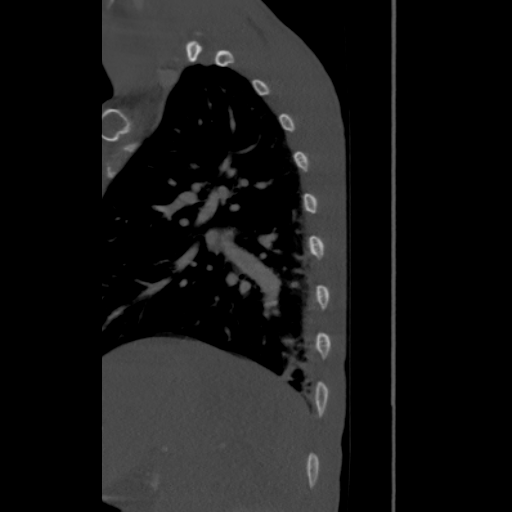
[im 28/82  bone]
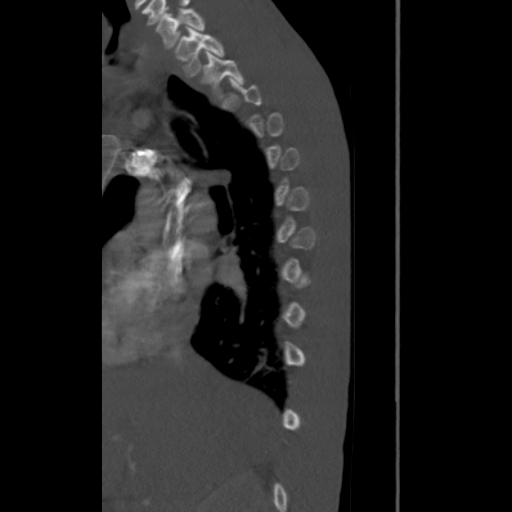
[im 41/82  bone]
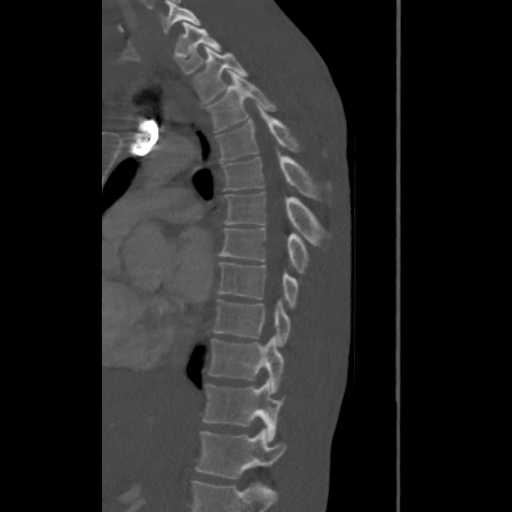
[im 55/82  bone]
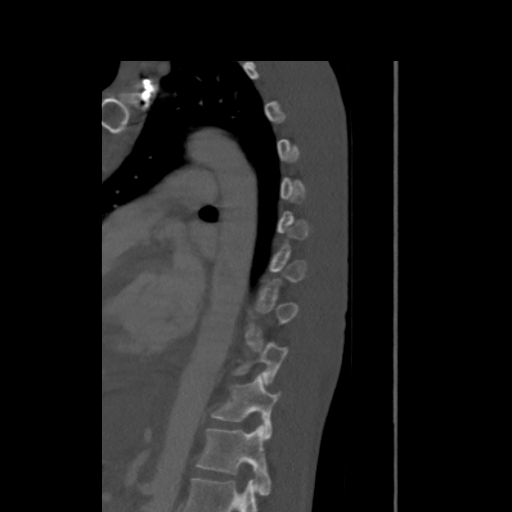
[im 68/82  bone]
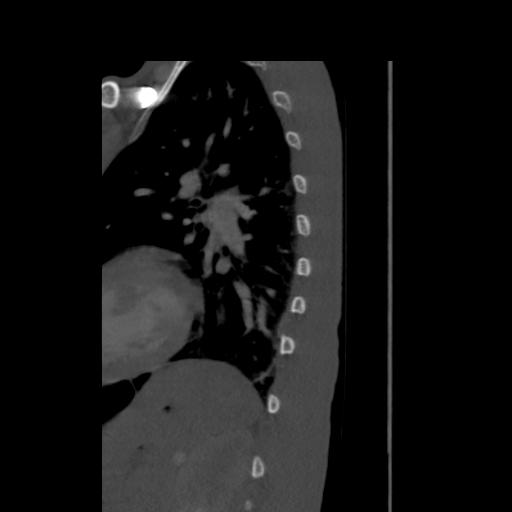

[10 of 33 positions shown; findings below may reference images not displayed]

Additional CT imaging and reformats of the thoracic spine were
performed.

RADIATION DOSE REDUCTION: This exam was performed according to the
departmental dose-optimization program which includes automated
exposure control, adjustment of the mA and/or kV according to
patient size and/or use of iterative reconstruction technique.

CONTRAST:  100mL OMNIPAQUE IOHEXOL 350 MG/ML SOLN
FINDINGS: Cardiovascular: Satisfactory opacification of the pulmonary arteries
to the segmental level. No evidence of pulmonary embolism. Normal
heart size. No pericardial effusion.

Mediastinum/Nodes: No enlarged mediastinal, hilar, or axillary lymph
nodes. Thyroid gland, trachea, and esophagus demonstrate no
significant findings.

Lungs/Pleura: Mild volume loss with patchy bibasilar dependent
airspace opacities. Findings are most suggestive of atelectasis. No
focal airspace infiltrate to suggest pneumonia. No pleural effusion,
pneumothorax or cavitary pulmonary nodules.

Upper Abdomen: No acute abnormality. Incidental note made of
replaced lateral segmental branch of the left hepatic artery.

Musculoskeletal: No chest wall abnormality. No acute or significant
osseous findings. Additional separately reconstructed dedicated CT
images of the thoracic spine were also evaluated. No evidence of
fracture, malalignment or significant degenerative change. No level
specific degenerative changes. No destructive changes, endplate
sclerosis or soft tissue abnormality to suggest
osteomyelitis/discitis.

Review of the MIP images confirms the above findings.
IMPRESSION: 1. Mild dependent bibasilar airspace opacities with associated
volume loss favored to reflect atelectasis. Small volume aspiration
is a consideration in the appropriate clinical context.
2. Otherwise, no acute cardiopulmonary process.
3. No evidence of fracture, malalignment or destructive process
within the thoracic spine.

## 2021-12-15 SURGERY — IRRIGATION AND DEBRIDEMENT EXTREMITY
Anesthesia: General | Site: Arm Lower | Laterality: Right

## 2021-12-15 MED ORDER — CHLORHEXIDINE GLUCONATE 0.12 % MT SOLN
15.0000 mL | Freq: Once | OROMUCOSAL | Status: AC
  Administered 2021-12-15: 15 mL via OROMUCOSAL

## 2021-12-15 MED ORDER — LIDOCAINE 2% (20 MG/ML) 5 ML SYRINGE
INTRAMUSCULAR | Status: DC | PRN
  Administered 2021-12-15: 80 mg via INTRAVENOUS

## 2021-12-15 MED ORDER — KETAMINE HCL 10 MG/ML IJ SOLN
INTRAMUSCULAR | Status: AC
  Filled 2021-12-15: qty 1

## 2021-12-15 MED ORDER — ACETAMINOPHEN 325 MG PO TABS
650.0000 mg | ORAL_TABLET | Freq: Four times a day (QID) | ORAL | Status: DC | PRN
  Administered 2021-12-20: 650 mg via ORAL
  Filled 2021-12-15 (×2): qty 2

## 2021-12-15 MED ORDER — LIDOCAINE 5 % EX PTCH
1.0000 | MEDICATED_PATCH | CUTANEOUS | Status: DC
Start: 2021-12-16 — End: 2021-12-28
  Administered 2021-12-15 – 2021-12-28 (×11): 1 via TRANSDERMAL
  Filled 2021-12-15 (×13): qty 1

## 2021-12-15 MED ORDER — IOHEXOL 350 MG/ML SOLN
100.0000 mL | Freq: Once | INTRAVENOUS | Status: AC | PRN
Start: 2021-12-15 — End: 2021-12-15
  Administered 2021-12-15: 100 mL via INTRAVENOUS

## 2021-12-15 MED ORDER — BUPIVACAINE-EPINEPHRINE 0.25% -1:200000 IJ SOLN
INTRAMUSCULAR | Status: AC
  Filled 2021-12-15: qty 1

## 2021-12-15 MED ORDER — CHLORHEXIDINE GLUCONATE CLOTH 2 % EX PADS: MEDICATED_PAD | CUTANEOUS | Status: AC

## 2021-12-15 MED ORDER — HYDROMORPHONE HCL 1 MG/ML IJ SOLN: 0.2500 mg | INTRAMUSCULAR | Status: DC | PRN

## 2021-12-15 MED ORDER — SODIUM CHLORIDE 0.9 % IR SOLN
Status: DC | PRN
  Administered 2021-12-15: 3000 mL

## 2021-12-15 MED ORDER — NITROGLYCERIN 0.4 MG SL SUBL
0.4000 mg | SUBLINGUAL_TABLET | SUBLINGUAL | Status: DC | PRN
  Administered 2021-12-15: 0.4 mg via SUBLINGUAL
  Filled 2021-12-15: qty 1

## 2021-12-15 MED ORDER — BUPIVACAINE HCL (PF) 0.5 % IJ SOLN
INTRAMUSCULAR | Status: AC
  Filled 2021-12-15: qty 30

## 2021-12-15 MED ORDER — OXYCODONE HCL 5 MG PO TABS: 5.0000 mg | ORAL_TABLET | Freq: Once | ORAL | Status: DC | PRN

## 2021-12-15 MED ORDER — VANCOMYCIN HCL 1250 MG/250ML IV SOLN
1250.0000 mg | Freq: Two times a day (BID) | INTRAVENOUS | Status: DC
Start: 2021-12-15 — End: 2021-12-18
  Administered 2021-12-15 – 2021-12-18 (×7): 1250 mg via INTRAVENOUS
  Filled 2021-12-15 (×7): qty 250

## 2021-12-15 MED ORDER — PROPOFOL 10 MG/ML IV BOLUS
INTRAVENOUS | Status: DC | PRN
  Administered 2021-12-15: 200 mg via INTRAVENOUS

## 2021-12-15 MED ORDER — KETAMINE HCL 10 MG/ML IJ SOLN
INTRAMUSCULAR | Status: DC | PRN
  Administered 2021-12-15: 20 mg via INTRAVENOUS
  Administered 2021-12-15: 50 mg via INTRAVENOUS

## 2021-12-15 MED ORDER — OXYCODONE HCL 5 MG PO TABS
5.0000 mg | ORAL_TABLET | ORAL | Status: DC | PRN
  Administered 2021-12-15 (×2): 5 mg via ORAL
  Filled 2021-12-15 (×2): qty 1

## 2021-12-15 MED ORDER — LIDOCAINE HCL (PF) 2 % IJ SOLN
INTRAMUSCULAR | Status: AC
  Filled 2021-12-15: qty 5

## 2021-12-15 MED ORDER — FENTANYL CITRATE (PF) 250 MCG/5ML IJ SOLN
INTRAMUSCULAR | Status: DC | PRN
  Administered 2021-12-15: 25 ug via INTRAVENOUS
  Administered 2021-12-15: 50 ug via INTRAVENOUS
  Administered 2021-12-15: 25 ug via INTRAVENOUS
  Administered 2021-12-15 (×3): 50 ug via INTRAVENOUS

## 2021-12-15 MED ORDER — ALUM & MAG HYDROXIDE-SIMETH 200-200-20 MG/5ML PO SUSP
30.0000 mL | Freq: Once | ORAL | Status: AC
  Administered 2021-12-15: 30 mL via ORAL
  Filled 2021-12-15: qty 30

## 2021-12-15 MED ORDER — MIDAZOLAM HCL 5 MG/5ML IJ SOLN
INTRAMUSCULAR | Status: DC | PRN
  Administered 2021-12-15 (×2): 1 mg via INTRAVENOUS

## 2021-12-15 MED ORDER — DEXAMETHASONE SODIUM PHOSPHATE 10 MG/ML IJ SOLN
INTRAMUSCULAR | Status: AC
  Filled 2021-12-15: qty 1

## 2021-12-15 MED ORDER — ACETAMINOPHEN 500 MG PO TABS
1000.0000 mg | ORAL_TABLET | Freq: Three times a day (TID) | ORAL | Status: AC
  Administered 2021-12-15 – 2021-12-18 (×8): 1000 mg via ORAL
  Filled 2021-12-15 (×9): qty 2

## 2021-12-15 MED ORDER — LIDOCAINE HCL 1 % IJ SOLN
INTRAMUSCULAR | Status: AC
  Filled 2021-12-15: qty 20

## 2021-12-15 MED ORDER — ONDANSETRON HCL 4 MG/2ML IJ SOLN
INTRAMUSCULAR | Status: AC
  Filled 2021-12-15: qty 2

## 2021-12-15 MED ORDER — ONDANSETRON HCL 4 MG/2ML IJ SOLN: 4.0000 mg | Freq: Once | INTRAMUSCULAR | Status: DC | PRN

## 2021-12-15 MED ORDER — LACTATED RINGERS IV SOLN: INTRAVENOUS | Status: DC

## 2021-12-15 MED ORDER — MUPIROCIN 2 % EX OINT
1.0000 "application " | TOPICAL_OINTMENT | Freq: Two times a day (BID) | CUTANEOUS | Status: AC
  Administered 2021-12-15 – 2021-12-19 (×10): 1 via NASAL
  Filled 2021-12-15 (×3): qty 22

## 2021-12-15 MED ORDER — LIDOCAINE VISCOUS HCL 2 % MT SOLN
15.0000 mL | Freq: Once | OROMUCOSAL | Status: AC
  Administered 2021-12-15: 15 mL via ORAL
  Filled 2021-12-15: qty 15

## 2021-12-15 MED ORDER — ORAL CARE MOUTH RINSE: 15.0000 mL | Freq: Once | OROMUCOSAL | Status: AC

## 2021-12-15 MED ORDER — ONDANSETRON HCL 4 MG/2ML IJ SOLN
INTRAMUSCULAR | Status: DC | PRN
  Administered 2021-12-15: 4 mg via INTRAVENOUS

## 2021-12-15 MED ORDER — POVIDONE-IODINE 10 % EX SWAB
2.0000 "application " | CUTANEOUS | Status: AC
  Administered 2021-12-15: 2 via TOPICAL

## 2021-12-15 MED ORDER — SODIUM CHLORIDE (PF) 0.9 % IJ SOLN
INTRAMUSCULAR | Status: AC
  Filled 2021-12-15: qty 50

## 2021-12-15 MED ORDER — CEFAZOLIN SODIUM-DEXTROSE 2-4 GM/100ML-% IV SOLN
2.0000 g | INTRAVENOUS | Status: AC
  Administered 2021-12-15: 2 g via INTRAVENOUS
  Filled 2021-12-15: qty 100

## 2021-12-15 MED ORDER — DEXMEDETOMIDINE (PRECEDEX) IN NS 20 MCG/5ML (4 MCG/ML) IV SYRINGE
PREFILLED_SYRINGE | INTRAVENOUS | Status: DC | PRN
  Administered 2021-12-15: 12 ug via INTRAVENOUS
  Administered 2021-12-15: 8 ug via INTRAVENOUS

## 2021-12-15 MED ORDER — MIDAZOLAM HCL 2 MG/2ML IJ SOLN
INTRAMUSCULAR | Status: AC
  Filled 2021-12-15: qty 2

## 2021-12-15 MED ORDER — PROPOFOL 10 MG/ML IV BOLUS
INTRAVENOUS | Status: AC
  Filled 2021-12-15: qty 20

## 2021-12-15 MED ORDER — 0.9 % SODIUM CHLORIDE (POUR BTL) OPTIME
TOPICAL | Status: DC | PRN
  Administered 2021-12-15: 1000 mL

## 2021-12-15 MED ORDER — OXYCODONE HCL 5 MG/5ML PO SOLN: 5.0000 mg | Freq: Once | ORAL | Status: DC | PRN

## 2021-12-15 MED ORDER — FENTANYL CITRATE (PF) 250 MCG/5ML IJ SOLN
INTRAMUSCULAR | Status: AC
  Filled 2021-12-15: qty 5

## 2021-12-15 SURGICAL SUPPLY — 44 items
BAG COUNTER SPONGE SURGICOUNT (BAG) IMPLANT
BAG ZIPLOCK 12X15 (MISCELLANEOUS) ×2 IMPLANT
BLADE SURG SZ10 CARB STEEL (BLADE) ×2 IMPLANT
BNDG ELASTIC 4X5.8 VLCR STR LF (GAUZE/BANDAGES/DRESSINGS) ×2 IMPLANT
BNDG ESMARK 4X9 LF (GAUZE/BANDAGES/DRESSINGS) ×1 IMPLANT
BNDG GAUZE ELAST 4 BULKY (GAUZE/BANDAGES/DRESSINGS) ×2 IMPLANT
COVER MAYO STAND STRL (DRAPES) ×2 IMPLANT
COVER SURGICAL LIGHT HANDLE (MISCELLANEOUS) ×2 IMPLANT
CUFF TOURN SGL QUICK 18X4 (TOURNIQUET CUFF) ×2 IMPLANT
DRAIN PENROSE 0.5X18 (DRAIN) ×1 IMPLANT
DRAPE ORTHO SPLIT 77X108 STRL (DRAPES) ×2
DRAPE SURG 17X11 SM STRL (DRAPES) ×4 IMPLANT
DRAPE SURG ORHT 6 SPLT 77X108 (DRAPES) IMPLANT
DRSG EMULSION OIL 3X3 NADH (GAUZE/BANDAGES/DRESSINGS) ×2 IMPLANT
DRSG PAD ABDOMINAL 8X10 ST (GAUZE/BANDAGES/DRESSINGS) ×2 IMPLANT
ELECT REM PT RETURN 15FT ADLT (MISCELLANEOUS) ×2 IMPLANT
GAUZE PACKING IODOFORM 1/4X15 (PACKING) ×1 IMPLANT
GAUZE SPONGE 4X4 12PLY STRL (GAUZE/BANDAGES/DRESSINGS) ×3 IMPLANT
GLOVE SURG ORTHO 8.0 STRL STRW (GLOVE) ×2 IMPLANT
GOWN STRL REUS W/ TWL XL LVL3 (GOWN DISPOSABLE) ×1 IMPLANT
GOWN STRL REUS W/TWL XL LVL3 (GOWN DISPOSABLE) ×1
IV LACTATED RINGER IRRG 3000ML (IV SOLUTION)
IV LR IRRIG 3000ML ARTHROMATIC (IV SOLUTION) ×1 IMPLANT
KIT BASIN OR (CUSTOM PROCEDURE TRAY) ×2 IMPLANT
KIT TURNOVER KIT A (KITS) IMPLANT
MANIFOLD NEPTUNE II (INSTRUMENTS) ×2 IMPLANT
PACK ORTHO EXTREMITY (CUSTOM PROCEDURE TRAY) ×2 IMPLANT
PAD CAST 4YDX4 CTTN HI CHSV (CAST SUPPLIES) ×1 IMPLANT
PADDING CAST COTTON 4X4 STRL (CAST SUPPLIES)
PENCIL SMOKE EVACUATOR (MISCELLANEOUS) IMPLANT
PROTECTOR NERVE ULNAR (MISCELLANEOUS) ×2 IMPLANT
SOL PREP POV-IOD 4OZ 10% (MISCELLANEOUS) ×2 IMPLANT
SOL SCRUB PVP POV-IOD 4OZ 7.5% (MISCELLANEOUS) ×2
SOLUTION SCRB POV-IOD 4OZ 7.5% (MISCELLANEOUS) ×1 IMPLANT
SUT PROLENE 3 0 PS 2 (SUTURE) ×2 IMPLANT
SUT VIC AB 1 CT1 27 (SUTURE) ×1
SUT VIC AB 1 CT1 27XBRD ANTBC (SUTURE) ×1 IMPLANT
SUT VIC AB 2-0 CT1 27 (SUTURE) ×1
SUT VIC AB 2-0 CT1 27XBRD (SUTURE) ×1 IMPLANT
SYR 20ML LL LF (SYRINGE) ×2 IMPLANT
SYR CONTROL 10ML LL (SYRINGE) ×2 IMPLANT
TAPE CLOTH SURG 6X10 WHT LF (GAUZE/BANDAGES/DRESSINGS) ×1 IMPLANT
TOWEL OR 17X26 10 PK STRL BLUE (TOWEL DISPOSABLE) ×2 IMPLANT
YANKAUER SUCT BULB TIP 10FT TU (MISCELLANEOUS) ×1 IMPLANT

## 2021-12-15 NOTE — Anesthesia Postprocedure Evaluation (Signed)
Anesthesia Post Note  Patient: Stephen Shepard  Procedure(s) Performed: IRRIGATION AND DEBRIDEMENT RIGHT HAND AND SHOULDER (Right: Arm Lower)     Patient location during evaluation: PACU Anesthesia Type: General Level of consciousness: awake and alert and oriented Pain management: pain level controlled Vital Signs Assessment: post-procedure vital signs reviewed and stable Respiratory status: spontaneous breathing, nonlabored ventilation and respiratory function stable Cardiovascular status: blood pressure returned to baseline and stable Postop Assessment: no apparent nausea or vomiting Anesthetic complications: no   No notable events documented.  Last Vitals:  Vitals:   12/15/21 1801 12/15/21 1822  BP: (!) 137/100 (!) 143/98  Pulse: 81 78  Resp: (!) 26 20  Temp:  36.8 C  SpO2: 96% 98%    Last Pain:  Vitals:   12/15/21 1822  TempSrc: Oral  PainSc:                  [name]

## 2021-12-15 NOTE — Assessment & Plan Note (Signed)
CT PE protocol with mild dependent bibasilar airspace opacities with associated volume loss favored to reflect atelectasis, small volume aspiration is Tres Grzywacz consideration follow

## 2021-12-15 NOTE — Consult Note (Addendum)
Reason for Consult:Right hand infection Referring Physician: Fayrene Helper Time called: [id number] Time at bedside: [name] is an 31 y.o. male.  HPI: Stephen Shepard came in to the ED with a 2d hx/o right hand infection. It began out of the blue and quickly progressed in severity. Stephen Shepard recently was on Augmentin for sinusitis and feels it's related to that. 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  Past Medical History:  Diagnosis Date   Seizures ([place])     History reviewed. No pertinent surgical history.  No family history on file.  Social History:  reports that Stephen Shepard has been smoking cigarettes. Stephen Shepard has been smoking an average of 1 pack per day. Stephen Shepard has never used smokeless tobacco. Stephen Shepard reports that Stephen Shepard does not drink alcohol and does not use drugs.  Allergies:  Allergies  Allergen Reactions   Ibuprofen Anaphylaxis   Other Anaphylaxis    Kepra   Wasp Venom Protein Swelling    hives    Medications: I have reviewed the patient's current medications.  Results for orders placed or performed during the hospital encounter of 12/14/21 (from the past 48 hour(s))  CBC with Differential     Status: Abnormal   Collection Time: 12/14/21  4:40 PM  Result Value Ref Range   WBC 15.7 (H) 4.0 - 10.5 K/uL   RBC 4.88 4.22 - 5.81 MIL/uL   Hemoglobin 13.8 13.0 - 17.0 g/dL   HCT 42.5 39.0 - 52.0 %   MCV 87.1 80.0 - 100.0 fL   MCH 28.3 26.0 - 34.0 pg   MCHC 32.5 30.0 - 36.0 g/dL   RDW 14.1 11.5 - 15.5 %   Platelets 435 (H) 150 - 400 K/uL   nRBC 0.0 0.0 - 0.2 %   Neutrophils Relative % 79 %   Neutro Abs 12.3 (H) 1.7 - 7.7 K/uL   Lymphocytes Relative 15 %   Lymphs Abs 2.3 0.7 - 4.0 K/uL   Monocytes Relative 5 %   Monocytes Absolute 0.7 0.1 - 1.0 K/uL   Eosinophils Relative 1 %   Eosinophils Absolute 0.2 0.0 - 0.5 K/uL   Basophils Relative 0 %   Basophils Absolute 0.1 0.0 - 0.1 K/uL   Immature Granulocytes 0 %   Abs Immature Granulocytes 0.06 0.00 - 0.07 K/uL    Comment:  Performed at [facility], [place] [address], [place], [place] [id number]  Basic metabolic panel     Status: Abnormal   Collection Time: 12/14/21  4:40 PM  Result Value Ref Range   Sodium 139 135 - 145 mmol/L   Potassium 3.8 3.5 - 5.1 mmol/L   Chloride 99 98 - 111 mmol/L   CO2 30 22 - 32 mmol/L   Glucose, Bld 111 (H) 70 - 99 mg/dL    Comment: Glucose reference range applies only to samples taken after fasting for at least 8 hours.   BUN 21 (H) 6 - 20 mg/dL   Creatinine, Ser 1.61 (H) 0.61 - 1.24 mg/dL   Calcium 10.1 8.9 - 10.3 mg/dL   GFR, Estimated 58 (L) >60 mL/min    Comment: (NOTE) Calculated using the CKD-EPI Creatinine Equation (2021)    Anion gap 10 5 - 15    Comment: Performed at [facility], [place] [address] [place], [place] [address]  Lactic acid, plasma     Status: None   Collection Time: 12/14/21  4:40 PM  Result Value Ref Range  Lactic Acid, Venous 1.3 0.5 - 1.9 mmol/L    Comment: Performed at [facility], [place] [address] [place], [place] [address]  Blood culture (routine x 2)     Status: None (Preliminary result)   Collection Time: 12/14/21  5:25 PM   Specimen: BLOOD  Result Value Ref Range   Specimen Description      BLOOD LEFT ANTECUBITAL Performed at [facility], [place] [address] [place], [place] [address]    Special Requests      BOTTLES DRAWN AEROBIC AND ANAEROBIC Blood Culture adequate volume Performed at [place] [address], [place], [place] [address]    Culture      NO GROWTH < 12 HOURS Performed at [facility] [address] [place], [place] [address]    Report Status PENDING   Resp Panel by RT-PCR (Flu A&B, Covid)     Status: None   Collection Time: 12/14/21 10:30 PM   Specimen: Nasopharyngeal(NP) swabs in vial transport medium  Result Value Ref Range   SARS Coronavirus 2 by RT PCR NEGATIVE NEGATIVE    Comment: (NOTE) SARS-CoV-2 target  nucleic acids are NOT DETECTED.  The SARS-CoV-2 RNA is generally detectable in upper respiratory specimens during the acute phase of infection. The lowest concentration of SARS-CoV-2 viral copies this assay can detect is 138 copies/mL. A negative result does not preclude SARS-Cov-2 infection and should not be used as the sole basis for treatment or other patient management decisions. A negative result may occur with  improper specimen collection/handling, submission of specimen other than nasopharyngeal swab, presence of viral mutation(s) within the areas targeted by this assay, and inadequate number of viral copies(<138 copies/mL). A negative result must be combined with clinical observations, patient history, and epidemiological information. The expected result is Negative.  Fact Sheet for Patients:  EntrepreneurPulse.com.au  Fact Sheet for Healthcare Providers:  IncredibleEmployment.be  This test is no t yet approved or cleared by the [place] FDA and  has been authorized for detection and/or diagnosis of SARS-CoV-2 by FDA under an Emergency Use Authorization (EUA). This EUA will remain  in effect (meaning this test can be used) for the duration of the COVID-19 declaration under Section 564(b)(1) of the Act, 21 U.S.C.section 360bbb-3(b)(1), unless the authorization is terminated  or revoked sooner.       Influenza A by PCR NEGATIVE NEGATIVE   Influenza B by PCR NEGATIVE NEGATIVE    Comment: (NOTE) The Xpert Xpress SARS-CoV-2/FLU/RSV plus assay is intended as an aid in the diagnosis of influenza from Nasopharyngeal swab specimens and should not be used as a sole basis for treatment. Nasal washings and aspirates are unacceptable for Xpert Xpress SARS-CoV-2/FLU/RSV testing.  Fact Sheet for Patients: EntrepreneurPulse.com.au  Fact Sheet for Healthcare Providers: IncredibleEmployment.be  This test  is not yet approved or cleared by the [place] FDA and has been authorized for detection and/or diagnosis of SARS-CoV-2 by FDA under an Emergency Use Authorization (EUA). This EUA will remain in effect (meaning this test can be used) for the duration of the COVID-19 declaration under Section 564(b)(1) of the Act, 21 U.S.C. section 360bbb-3(b)(1), unless the authorization is terminated or revoked.  Performed at [facility], [place] [address] [place], [place] [address]   Lactic acid, plasma     Status: None   Collection Time: 12/14/21 10:50 PM  Result Value Ref Range   Lactic Acid, Venous 0.5 0.5 - 1.9 mmol/L  Comment: Performed at [facility], [address], [place], [place] [address]  CBC     Status: Abnormal   Collection Time: 12/15/21  4:24 AM  Result Value Ref Range   WBC 15.0 (H) 4.0 - 10.5 K/uL   RBC 4.48 4.22 - 5.81 MIL/uL   Hemoglobin 12.7 (L) 13.0 - 17.0 g/dL   HCT 50.3 (L) 88.8 - 28.0 %   MCV 85.0 80.0 - 100.0 fL   MCH 28.3 26.0 - 34.0 pg   MCHC 33.3 30.0 - 36.0 g/dL   RDW 03.4 91.7 - 91.5 %   Platelets 369 150 - 400 K/uL   nRBC 0.0 0.0 - 0.2 %    Comment: Performed at [facility], [address] [place], [place] [address]  Basic metabolic panel     Status: Abnormal   Collection Time: 12/15/21  4:24 AM  Result Value Ref Range   Sodium 139 135 - 145 mmol/L   Potassium 4.1 3.5 - 5.1 mmol/L   Chloride 103 98 - 111 mmol/L   CO2 29 22 - 32 mmol/L   Glucose, Bld 114 (H) 70 - 99 mg/dL    Comment: Glucose reference range applies only to samples taken after fasting for at least 8 hours.   BUN 15 6 - 20 mg/dL   Creatinine, Ser 9.48 0.61 - 1.24 mg/dL   Calcium 9.2 8.9 - 01.6 mg/dL   GFR, Estimated >55 >37 mL/min    Comment: (NOTE) Calculated using the CKD-EPI Creatinine Equation (2021)    Anion gap 7 5 - 15    Comment: Performed at [facility], [address], [place], [place] [address]     DG Thoracic Spine 2 View  Result Date: 12/15/2021 CLINICAL DATA:  Provided history: Upper back pain since March 2023. EXAM: THORACIC SPINE 2 VIEWS COMPARISON:  Chest CT 06/14/2019. FINDINGS: The patient was unable to tolerate lateral view radiographs, and only AP view radiographs could be acquired. This significantly limits evaluation. Levocurvature of the upper thoracic and lower cervical spine. Within described limitations, there is no appreciable thoracic vertebral compression fracture. IMPRESSION: The patient was unable to tolerate lateral view radiographs, significantly limiting evaluation. No appreciable thoracic vertebral compression fracture on the acquired AP view radiographs. Levocurvature of the upper thoracic and lower cervical spine. Electronically Signed   By: Jackey Loge D.O.   On: 12/15/2021 09:37   DG Wrist Complete Right  Result Date: 12/14/2021 CLINICAL DATA:  Right wrist pain and swelling. EXAM: RIGHT WRIST - COMPLETE 3+ VIEW COMPARISON:  None Available. FINDINGS: The joint spaces are maintained. No acute bony findings or destructive bony changes. IMPRESSION: No acute bony findings. Electronically Signed   By: Rudie Meyer M.D.   On: 12/14/2021 17:47   CT Angio Chest Pulmonary Embolism (PE) W or WO Contrast  Result Date: 12/15/2021 CLINICAL DATA:  Chest pain, shortness of breath with pleuritic chest pain EXAM: CT ANGIOGRAPHY CHEST WITH CONTRAST TECHNIQUE: Multidetector CT imaging of the chest was performed using the standard protocol during bolus administration of intravenous contrast. Multiplanar CT image reconstructions and MIPs were obtained to evaluate the vascular anatomy. Additional CT imaging and reformats of the thoracic spine were performed. RADIATION DOSE REDUCTION: This exam was performed according to the departmental dose-optimization program which includes automated exposure control, adjustment of the mA and/or kV according to patient size and/or use of iterative  reconstruction technique. CONTRAST:  OMNIPAQUE IOHEXOL 350 MG/ML SOLN COMPARISON:  CT scan of  the thoracic spine FINDINGS: Cardiovascular: Satisfactory opacification of the pulmonary arteries to the segmental level. No evidence of pulmonary embolism. Normal heart size. No pericardial effusion. Mediastinum/Nodes: No enlarged mediastinal, hilar, or axillary lymph nodes. Thyroid gland, trachea, and esophagus demonstrate no significant findings. Lungs/Pleura: Mild volume loss with patchy bibasilar dependent airspace opacities. Findings are most suggestive of atelectasis. No focal airspace infiltrate to suggest pneumonia. No pleural effusion, pneumothorax or cavitary pulmonary nodules. Upper Abdomen: No acute abnormality. Incidental note made of replaced lateral segmental branch of the left hepatic artery. Musculoskeletal: No chest wall abnormality. No acute or significant osseous findings. Additional separately reconstructed dedicated CT images of the thoracic spine were also evaluated. No evidence of fracture, malalignment or significant degenerative change. No level specific degenerative changes. No destructive changes, endplate sclerosis or soft tissue abnormality to suggest osteomyelitis/discitis. Review of the MIP images confirms the above findings. IMPRESSION: 1. Mild dependent bibasilar airspace opacities with associated volume loss favored to reflect atelectasis. Small volume aspiration is a consideration in the appropriate clinical context. 2. Otherwise, no acute cardiopulmonary process. 3. No evidence of fracture, malalignment or destructive process within the thoracic spine. Electronically Signed   By: Jacqulynn Cadet M.D.   On: 12/15/2021 10:17   CT T-SPINE NO CHARGE  Result Date: 12/15/2021 CLINICAL DATA:  Chest pain, shortness of breath with pleuritic chest pain EXAM: CT ANGIOGRAPHY CHEST WITH CONTRAST TECHNIQUE: Multidetector CT imaging of the chest was performed using the standard protocol  during bolus administration of intravenous contrast. Multiplanar CT image reconstructions and MIPs were obtained to evaluate the vascular anatomy. Additional CT imaging and reformats of the thoracic spine were performed. RADIATION DOSE REDUCTION: This exam was performed according to the departmental dose-optimization program which includes automated exposure control, adjustment of the mA and/or kV according to patient size and/or use of iterative reconstruction technique. CONTRAST:  174mL OMNIPAQUE IOHEXOL 350 MG/ML SOLN COMPARISON:  CT scan of the thoracic spine FINDINGS: Cardiovascular: Satisfactory opacification of the pulmonary arteries to the segmental level. No evidence of pulmonary embolism. Normal heart size. No pericardial effusion. Mediastinum/Nodes: No enlarged mediastinal, hilar, or axillary lymph nodes. Thyroid gland, trachea, and esophagus demonstrate no significant findings. Lungs/Pleura: Mild volume loss with patchy bibasilar dependent airspace opacities. Findings are most suggestive of atelectasis. No focal airspace infiltrate to suggest pneumonia. No pleural effusion, pneumothorax or cavitary pulmonary nodules. Upper Abdomen: No acute abnormality. Incidental note made of replaced lateral segmental branch of the left hepatic artery. Musculoskeletal: No chest wall abnormality. No acute or significant osseous findings. Additional separately reconstructed dedicated CT images of the thoracic spine were also evaluated. No evidence of fracture, malalignment or significant degenerative change. No level specific degenerative changes. No destructive changes, endplate sclerosis or soft tissue abnormality to suggest osteomyelitis/discitis. Review of the MIP images confirms the above findings. IMPRESSION: 1. Mild dependent bibasilar airspace opacities with associated volume loss favored to reflect atelectasis. Small volume aspiration is a consideration in the appropriate clinical context. 2. Otherwise, no acute  cardiopulmonary process. 3. No evidence of fracture, malalignment or destructive process within the thoracic spine. Electronically Signed   By: Jacqulynn Cadet M.D.   On: 12/15/2021 10:17   DG CHEST PORT 1 VIEW  Result Date: 12/15/2021 CLINICAL DATA:  Dyspnea EXAM: PORTABLE CHEST 1 VIEW COMPARISON:  09/29/2021 FINDINGS: Lungs are clear.  No pleural effusion or pneumothorax. The heart is normal in size. IMPRESSION: No evidence of acute cardiopulmonary disease. Electronically Signed   By: Julian Hy M.D.   On:  12/15/2021 00:16   DG Hand Complete Right  Result Date: 12/14/2021 CLINICAL DATA:  Pain and swelling. EXAM: RIGHT HAND - COMPLETE 3+ VIEW COMPARISON:  None Available. FINDINGS: The joint spaces are maintained. No acute bony findings or destructive bony changes. There is massive soft tissue swelling noted on the dorsum of the hand. No gas is seen in the soft tissues. No radiopaque foreign body. IMPRESSION: Massive soft tissue swelling but no acute bony findings. Electronically Signed   By: Marijo Sanes M.D.   On: 12/14/2021 17:45    Review of Systems  Constitutional:  Positive for chills. Negative for diaphoresis and fever.  HENT:  Negative for ear discharge, ear pain, hearing loss and tinnitus.   Eyes:  Negative for photophobia and pain.  Respiratory:  Negative for cough and shortness of breath.   Cardiovascular:  Negative for chest pain.  Gastrointestinal:  Negative for abdominal pain, nausea and vomiting.  Genitourinary:  Negative for dysuria, flank pain, frequency and urgency.  Musculoskeletal:  Positive for arthralgias (Right hand>>right shoulder). Negative for back pain, myalgias and neck pain.  Neurological:  Negative for dizziness and headaches.  Hematological:  Does not bruise/bleed easily.  Psychiatric/Behavioral:  The patient is not nervous/anxious.   Blood pressure 138/90, pulse 90, temperature 99.1 F (37.3 C), temperature source Oral, resp. rate 19, height 6\' 2"   (1.88 m), weight 74.8 kg, SpO2 100 %. Physical Exam Constitutional:      General: Stephen Shepard is not in acute distress.    Appearance: Stephen Shepard is well-developed. Stephen Shepard is not diaphoretic.  HENT:     Head: Normocephalic and atraumatic.  Eyes:     General: No scleral icterus.       Right eye: No discharge.        Left eye: No discharge.     Conjunctiva/sclera: Conjunctivae normal.  Cardiovascular:     Rate and Rhythm: Normal rate and regular rhythm.  Pulmonary:     Effort: Pulmonary effort is normal. No respiratory distress.  Musculoskeletal:     Cervical back: Normal range of motion.     Comments: Right shoulder, elbow, wrist, digits- no skin wounds, discrete fluctuant areas anterior shoulder and dorsum of hand, hand held in flexed position, severe TTP flexor surface of index and long fingers into palm, severe diffuse edema hand, no instability, no blocks to motion  Sens  Ax/R/M/U intact  Mot   Ax/ R/ PIN/ M/ AIN/ U grossly intact  Rad 2+  Skin:    General: Skin is warm and dry.  Neurological:     Mental Status: Stephen Shepard is alert.  Psychiatric:        Mood and Affect: Mood normal.        Behavior: Behavior normal.    Assessment/Plan: Right hand infection -- Plan I&D today with Dr. Caralyn Guile. Will also I&D shoulder lesion. Please keep NPO.  The patient was seen and evaluated today.  The patient does have the needle marks over the dorsal aspect of the hand and over the shoulder region.  The patient states these are from a cat bite.  They do have the typical appearance of penetrating needle marks underneath the skin.  The patient has the abscess regions below the needle marks.  The patient admits to IV drug use.  The plan today will be for incision and drainage of the dorsal abscess on the right hand as well over the right shoulder region.  Signed informed consent was obtained today.  WE ARE PLANNING SURGERY FOR YOUR  UPPER EXTREMITY. THE RISKS AND BENEFITS OF SURGERY INCLUDE BUT NOT LIMITED TO BLEEDING  INFECTION, DAMAGE TO NEARBY NERVES ARTERIES TENDONS, FAILURE OF SURGERY TO ACCOMPLISH ITS INTENDED GOALS, PERSISTENT SYMPTOMS AND NEED FOR FURTHER SURGICAL INTERVENTION. WITH THIS IN MIND WE WILL PROCEED. I HAVE DISCUSSED WITH THE PATIENT THE PRE AND POSTOPERATIVE REGIMEN AND THE DOS AND DON'TS. PT VOICED UNDERSTANDING AND INFORMED CONSENT SIGNED.   Iran Planas MD   Lisette Abu, PA-C Orthopedic Surgery [phone] 12/15/2021, 10:49 AM

## 2021-12-15 NOTE — Hospital Course (Addendum)
Stephen Shepard is Stephen Shepard 31 y.o. male with medical history significant of seizure not on antiepileptics and history of IV heroin drug use who presents with concerns of worsening abscess.  He's now s/p surgery with orthopedics/hand.  Culture directed abx.  Blood cultures with candida glabrata, ID consulted and workup underway.  See below for additional details

## 2021-12-15 NOTE — Transfer of Care (Signed)
Immediate Anesthesia Transfer of Care Note  Patient: Stephen Shepard  Procedure(s) Performed: Procedure(s) with comments: IRRIGATION AND DEBRIDEMENT RIGHT HAND AND SHOULDER (Right) - upper and lower arm  Patient Location: PACU  Anesthesia Type:General  Level of Consciousness: Alert, Awake, Oriented  Airway & Oxygen Therapy: Patient Spontanous Breathing  Post-op Assessment: Report given to RN  Post vital signs: Reviewed and stable  Last Vitals:  Vitals:   12/15/21 1208 12/15/21 1536  BP: (!) 134/93 (!) 144/95  Pulse: 84 80  Resp: 17   Temp: 37.1 C 37.3 C  SpO2: 100% 100%    Complications: No apparent anesthesia complications

## 2021-12-15 NOTE — Op Note (Signed)
PREOPERATIVE DIAGNOSIS: Right shoulder abscess Right wrist and dorsum of the hand abscess IV drug use  POSTOPERATIVE DIAGNOSIS: Same  ATTENDING SURGEON: Dr. Bradly Bienenstock who scrubbed and present for the entire procedure  ASSISTANT SURGEON: None  ANESTHESIA: General via endotracheal tube  OPERATIVE PROCEDURE: Right shoulder incision and drainage subcutaneous abscess Right wrist and dorsum of the hand drainage of deep abscess Right hand fourth dorsal compartment tenosynovectomy Right hand third dorsal compartment tenosynovectomy Right hand second dorsal compartment tenosynovectomy  IMPLANTS: None  EBL: Minimal  RADIOGRAPHIC INTERPRETATION: None  SURGICAL INDICATIONS: Patient is an IV drug user who had worsening pain and swelling in his right hand and right shoulder.  Patient was seen and evaluated and recommended undergo the above procedures.  Risks of surgery include but not limited to bleeding infection damage nearby nerves arteries or tendons loss of motion of the wrist and digits incomplete relief of symptoms and need for further surgical invention.  Signed informed consent was obtained the day of surgery.  SURGICAL TECHNIQUE: The patient was prepped identified in the preoperative holding area marked apart a marker made on the right shoulder and wrist indicate correct operative site.  Patient brought back to operating placed supine on the anesthesia table where the general anesthesia was administered.  Patient been on preoperative antibiotics.  The right upper extremities and prepped and draped normal sterile fashion.  A timeout was called the correct site was identified procedure then begun.  Attention then turned to the shoulder.  The subcutaneous abscess was then lanced with a 15 blade.  Gross purulence was encountered.  Wound cultures were then taken.  Copious wound irrigation done all the way down to the level of the deltoid fascia and the wound was then packed with sterile packing  gauze.  A sterile compressive bandage then applied.  Patient tolerated the procedure well.  Attention was then turned to the right hand and wrist.  Sterile tourniquet was placed on the right forearm.  The tourniquet insufflated.  A longitude incision made directly over the abscess region over the dorsal aspect of the long finger metacarpal.  The patient did have multiple areas over the dorsal of the hand which were consistent with IV penetrating injuries.  Following this a longitudinal incision was then made.  Gross purulence was encountered.  The patient did have the thrombosed veins as well as what appeared to be foreign debris within the dorsal aspect of the hand and a black substance over the dorsal aspect of the hand which was then removed.  The substance was scattered throughout the dorsal aspect of the hand.  After drainage of the abscess region tenosynovectomy was then carried out of the fourth dorsal compartment.  The patient had a large amount of purulence over the course of the fourth dorsal compartment.  This was also done for the second and the third dorsal compartment separate intervention was then done for each compartment of the dorsal aspect of the hand.  Following all 3 compartment debridements the wound was then thoroughly irrigated.  Copious wound irrigation done.  Skin was then loosely closed with simple Prolene suture.  Adaptic dressing sterile compressive bandage then applied.  The patient was placed in well-padded volar splint extubated taken recovery in good condition.  POSTOPERATIVE PLAN: Patient be admitted back to the internal medicine service.  Continue with the bandage.  The nursing staff can begin daily packing changes to the wound on the shoulder and a dry dressing over the incision each day.  For the right wrist and forearm keep the splint on until we see him back.  We will need to look at the wound around Monday.  Continue with the IV antibiotics and convert to oral antibiotics  based on the wound cultures.

## 2021-12-15 NOTE — Assessment & Plan Note (Addendum)
C/o thoracic back pain Plain films unremarkable CT t spine without evidence of fracture, malalignment, or destructive process within thoracic spine MRI T/L spine without evidence of infection No LE weakness or saddle anesthesia Improved with lidocaine patch

## 2021-12-15 NOTE — ED Notes (Signed)
Pt was given pain meds (see MAR) and fluids at a maintenance dose per orders. Pt was repositioned in bed and legs were raised to take pressure off of his back, and he stated this helped a lot.

## 2021-12-15 NOTE — Anesthesia Procedure Notes (Signed)
Procedure Name: LMA Insertion Date/Time: 12/15/2021 4:21 PM Performed by: Florene Route, CRNA Patient Re-evaluated:Patient Re-evaluated prior to induction Oxygen Delivery Method: Circle system utilized Preoxygenation: Pre-oxygenation with 100% oxygen Induction Type: IV induction LMA: LMA inserted LMA Size: 4.0 Number of attempts: 1 Placement Confirmation: positive ETCO2 and breath sounds checked- equal and bilateral Tube secured with: Tape Dental Injury: Teeth and Oropharynx as per pre-operative assessment

## 2021-12-15 NOTE — ED Notes (Signed)
Patient transported to Ultrasound 

## 2021-12-15 NOTE — ED Notes (Signed)
Pt currently sleeping, respirations equal & unlabored.  ?

## 2021-12-15 NOTE — Progress Notes (Signed)
Pharmacy Antibiotic Note  Stephen Shepard is a 31 y.o. male admitted on 12/14/2021 with  numerous abscesses on bilateral upper extremities, including right hand and right shoulder . PMH significant for IVDU. Pt also reports recent cat scratch.  Pharmacy has been consulted for vancomycin dosing. Hand surgeon has been consulted.   Today, 12/15/21 -WBC elevated -SCr improved. CrCl >80 mL/min -Afebrile  Plan: Increase vancomycin dose to 1250 mg IV q12h given improved renal function for estimated AUC of 434 Goal vancomycin AUC 400-550. Monitor levels at steady state as needed Monitor culture data and renal function  Height: 6\' 2"  (188 cm) Weight: 74.8 kg (165 lb) IBW/kg (Calculated) : 82.2  Temp (24hrs), Avg:98.6 F (37 C), Min:98.1 F (36.7 C), Max:99.1 F (37.3 C)  Recent Labs  Lab 12/14/21 1640 12/14/21 2250 12/15/21 0424  WBC 15.7*  --  15.0*  CREATININE 1.61*  --  1.00  LATICACIDVEN 1.3 0.5  --     Estimated Creatinine Clearance: 112.2 mL/min (by C-G formula based on SCr of 1 mg/dL).    Allergies  Allergen Reactions   Ibuprofen Anaphylaxis   Other Anaphylaxis    Kepra   Wasp Venom Protein Swelling    hives    Antimicrobials this admission: vancomycin 5/18 >>  azithromycin 5/19 >>   Dose adjustments this admission:  Microbiology results: 5/17 BCx: ngtd  6/17, PharmD 12/15/2021 9:54 AM

## 2021-12-15 NOTE — Progress Notes (Signed)
PROGRESS NOTE    Stephen Shepard  UEA:[id number] DOB: 06-09-1991 DOA: 12/14/2021 PCP: Patient, [name] (Inactive)  Chief Complaint  Patient presents with   Abscess    Brief Narrative:  Stephen Shepard is Stephen Shepard 31 y.o. male with medical history significant of seizure not on antiepileptics and history of IV heroin drug use who presents with concerns of worsening abscess.    Assessment & Plan:   Principal Problem:   Abscess of multiple sites Active Problems:   Shortness of breath   IVDU (intravenous drug user)   Back pain   AKI (acute kidney injury) (HCC)   Sepsis (HCC)   Assessment and Plan: * Abscess of multiple sites Sepsis related to multiple abscesses, tachycardic, leukocytosis at presentation -> ruled in R hand abscess and R shoulder abscess.  Several other locations appear to have spontaneously drained.  Several on L arm that appear to have spontaneously drained/improved.  US with heterogenous collection along dorsum of R hand and small collection in the anterior R shoulder S/p right shoulder incision and drainage subcutaneous abscess, R wrist and dorsum of the hand drainage of deep abscess, right hand fourth dorsal compartment tenosynovectomy, right hand third dorsal compartment tenosynovectomy, right hand second dorsal compartment tenosynovectomy on 5/18 Recommending daily packing changes to wound on shoulder and dry dressing on incision For R wrist and forearm, recommending keep splint in place and they'll see him back on Monday Follow operative cultures Blood cx pending Continue with IV vancomycin.  Patient did also mention possible cat scratch, will add on IV azithromycin Will discuss with ID as well Suspect related to most likely related to IVDU - close attention to blood cultures given multiple abscesses.   Shortness of breath CT PE protocol with mild dependent bibasilar airspace opacities with associated volume loss favored to reflect atelectasis, small volume aspiration  is Stephen Shepard consideration follow  Back pain C/o thoracic back pain Plain films unremarkable CT t spine without evidence of fracture, malalignment, or destructive process within thoracic spine Low threshold for additional imaging with MRI   IVDU (intravenous drug user) Reports last heroin use Stephen Shepard month ago -check HIV given new infection -follow acute hepatitis panel   AKI (acute kidney injury) (HCC) Resolved, follow      DVT prophylaxis: lovenox Code Status: full Family Communication: nne Disposition:   Status is: Inpatient Remains inpatient appropriate because: need for IV abx, hand surgery clearnace   Consultants:  ortho  Procedures:  As above  Antimicrobials:  Anti-infectives (From admission, onward)    Start     Dose/Rate Route Frequency Ordered Stop   12/15/21 2000  vancomycin (VANCOREADY) IVPB 1750 mg/350 mL  Status:  Discontinued        1,750 mg 175 mL/hr over 120 Minutes Intravenous Every 24 hours 12/14/21 2351 12/15/21 0953   12/15/21 1215  ceFAZolin (ANCEF) IVPB 2g/100 mL premix        2 g 200 mL/hr over 30 Minutes Intravenous On call to O.R. 12/15/21 1206 12/15/21 1635   12/15/21 1000  vancomycin (VANCOREADY) IVPB 1250 mg/250 mL        1,250 mg 166.7 mL/hr over 90 Minutes Intravenous Every 12 hours 12/15/21 0954     12/14/21 2345  azithromycin (ZITHROMAX) 500 mg in sodium chloride 0.9 % 250 mL IVPB        500 mg 250 mL/hr over 60 Minutes Intravenous Every 24 hours 12/14/21 2337     12/14/21 1930  vancomycin (VANCOCIN) IVPB 1000 mg/200 mL premix  1,000 mg 200 mL/hr over 60 Minutes Intravenous  Once 12/14/21 1920 12/14/21 2352       Subjective: C/o back pain   Objective: Vitals:   12/15/21 1750 12/15/21 1757 12/15/21 1801 12/15/21 1822  BP:   (!) 137/100 (!) 143/98  Pulse: 86  81 78  Resp: (!) 22 (!) 25 (!) 26 20  Temp:  97.8 F (36.6 C)  98.2 F (36.8 C)  TempSrc:    Oral  SpO2: 97%  96% 98%  Weight:      Height:        Intake/Output  Summary (Last 24 hours) at 12/15/2021 1833 Last data filed at 12/15/2021 1800 Gross per 24 hour  Intake 1068.14 ml  Output 1500 ml  Net -431.86 ml   Filed Weights   12/14/21 1622 12/15/21 1207  Weight: 74.8 kg 64.3 kg    Examination:  General exam: Appears uncomfortable Respiratory system: unlabored Cardiovascular system: RRR Gastrointestinal system: Abdomen is nondistended, soft and nontender MSK: midline tenderness to palpation in thoracic spine Central nervous system: Alert and oriented. No focal neurological deficits. Extremities: R hand swelling, ~2 cm boil to R shoulder    Data Reviewed: I have personally reviewed following labs and imaging studies  CBC: Recent Labs  Lab 12/14/21 1640 12/15/21 0424  WBC 15.7* 15.0*  NEUTROABS 12.3*  --   HGB 13.8 12.7*  HCT 42.5 38.1*  MCV 87.1 85.0  PLT 435* 369    Basic Metabolic Panel: Recent Labs  Lab 12/14/21 1640 12/15/21 0424  NA 139 139  K 3.8 4.1  CL 99 103  CO2 30 29  GLUCOSE 111* 114*  BUN 21* 15  CREATININE 1.61* 1.00  CALCIUM 10.1 9.2    GFR: Estimated Creatinine Clearance: 96.5 mL/min (by C-G formula based on SCr of 1 mg/dL).  Liver Function Tests: No results for input(s): AST, ALT, ALKPHOS, BILITOT, PROT, ALBUMIN in the last 168 hours.  CBG: No results for input(s): GLUCAP in the last 168 hours.   Recent Results (from the past 240 hour(s))  Blood culture (routine x 2)     Status: None (Preliminary result)   Collection Time: 12/14/21  5:25 PM   Specimen: BLOOD  Result Value Ref Range Status   Specimen Description   Final    BLOOD LEFT ANTECUBITAL Performed at [facility], [address] [place], [place] [address]    Special Requests   Final    BOTTLES DRAWN AEROBIC AND ANAEROBIC Blood Culture adequate volume Performed at [facility], [address], [place], [place] [address]    Culture   Final    NO GROWTH < 12 HOURS Performed at [facility], [address], [place], [place] [address]    Report Status PENDING  Incomplete  Resp Panel by RT-PCR (Flu Stephen Shepard&B, Covid)     Status: None   Collection Time: 12/14/21 10:30 PM   Specimen: Nasopharyngeal(NP) swabs in vial transport medium  Result Value Ref Range Status   SARS Coronavirus 2 by RT PCR NEGATIVE NEGATIVE Final    Comment: (NOTE) SARS-CoV-2 target nucleic acids are NOT DETECTED.  The SARS-CoV-2 RNA is generally detectable in upper respiratory specimens during the acute phase of infection. The lowest concentration of SARS-CoV-2 viral copies this assay can detect is 138 copies/mL. Kessler Kopinski negative result does not preclude SARS-Cov-2 infection and should not be used as the sole basis for treatment or other patient management decisions. Ada Holness negative result may occur with  improper specimen collection/handling, submission of specimen other than nasopharyngeal swab, presence of viral mutation(s) within the areas targeted by this assay, and inadequate number of viral copies(<138 copies/mL). Violette Morneault negative result must be combined with clinical observations, patient history, and epidemiological information. The expected result is Negative.  Fact Sheet for Patients:  BloggerCourse.com  Fact Sheet for Healthcare Providers:  SeriousBroker.it  This test is no t yet approved or cleared by the [place] FDA and  has been authorized for detection and/or diagnosis of SARS-CoV-2 by FDA under an Emergency Use Authorization (EUA). This EUA will remain  in effect (meaning this test can be used) for the duration of the COVID-19 declaration under Section 564(b)(1) of the Act, 21 U.S.C.section 360bbb-3(b)(1), unless the authorization is terminated  or revoked sooner.       Influenza Leonda Cristo by PCR NEGATIVE NEGATIVE Final   Influenza B by PCR NEGATIVE NEGATIVE Final    Comment: (NOTE) The Xpert Xpress SARS-CoV-2/FLU/RSV plus assay is intended as  an aid in the diagnosis of influenza from Nasopharyngeal swab specimens and should not be used as Florean Hoobler sole basis for treatment. Nasal washings and aspirates are unacceptable for Xpert Xpress SARS-CoV-2/FLU/RSV testing.  Fact Sheet for Patients: BloggerCourse.com  Fact Sheet for Healthcare Providers: SeriousBroker.it  This test is not yet approved or cleared by the [place] FDA and has been authorized for detection and/or diagnosis of SARS-CoV-2 by FDA under an Emergency Use Authorization (EUA). This EUA will remain in effect (meaning this test can be used) for the duration of the COVID-19 declaration under Section 564(b)(1) of the Act, 21 U.S.C. section 360bbb-3(b)(1), unless the authorization is terminated or revoked.  Performed at [facility], [address], [place], [place] [address]   Surgical PCR screen     Status: None   Collection Time: 12/15/21 12:58 PM   Specimen: Nasal Mucosa; Nasal Swab  Result Value Ref Range Status   MRSA, PCR NEGATIVE NEGATIVE Final   Staphylococcus aureus NEGATIVE NEGATIVE Final    Comment: (NOTE) The Xpert SA Assay (FDA approved for NASAL specimens in patients 78 years of age and older), is one component of Rilda Bulls comprehensive surveillance program. It is not intended to diagnose infection nor to guide or monitor treatment. Performed at [facility], [address], [place], [place] [address]          Radiology Studies: DG Thoracic Spine 2 View  Result Date: 12/15/2021 CLINICAL DATA:  Provided history: Upper back pain since March 2023. EXAM: THORACIC SPINE 2 VIEWS COMPARISON:  Chest CT 06/14/2019. FINDINGS: The patient was unable to tolerate lateral view radiographs, and only AP view radiographs could be acquired. This significantly limits evaluation. Levocurvature of the upper thoracic and lower cervical spine. Within described limitations, there is no  appreciable thoracic vertebral compression fracture. IMPRESSION: The patient was unable to tolerate lateral view radiographs, significantly limiting evaluation. No appreciable thoracic vertebral compression fracture on the acquired AP view radiographs. Levocurvature of the upper thoracic and lower cervical spine. Electronically Signed   By: Jackey Loge D.O.   On: 12/15/2021 09:37   DG Wrist Complete Right  Result Date: 12/14/2021 CLINICAL DATA:  Right wrist pain and swelling. EXAM: RIGHT WRIST - COMPLETE 3+ VIEW COMPARISON:  None Available. FINDINGS: The joint spaces are maintained. No acute bony findings or destructive bony changes. IMPRESSION: No acute bony findings. Electronically Signed   By: Rudie Meyer M.D.   On: 12/14/2021 17:47   CT Angio Chest Pulmonary  Embolism (PE) W or WO Contrast  Result Date: 12/15/2021 CLINICAL DATA:  Chest pain, shortness of breath with pleuritic chest pain EXAM: CT ANGIOGRAPHY CHEST WITH CONTRAST TECHNIQUE: Multidetector CT imaging of the chest was performed using the standard protocol during bolus administration of intravenous contrast. Multiplanar CT image reconstructions and MIPs were obtained to evaluate the vascular anatomy. Additional CT imaging and reformats of the thoracic spine were performed. RADIATION DOSE REDUCTION: This exam was performed according to the departmental dose-optimization program which includes automated exposure control, adjustment of the mA and/or kV according to patient size and/or use of iterative reconstruction technique. CONTRAST:  OMNIPAQUE IOHEXOL 350 MG/ML SOLN COMPARISON:  CT scan of the thoracic spine FINDINGS: Cardiovascular: Satisfactory opacification of the pulmonary arteries to the segmental level. No evidence of pulmonary embolism. Normal heart size. No pericardial effusion. Mediastinum/Nodes: No enlarged mediastinal, hilar, or axillary lymph nodes. Thyroid gland, trachea, and esophagus demonstrate no significant findings.  Lungs/Pleura: Mild volume loss with patchy bibasilar dependent airspace opacities. Findings are most suggestive of atelectasis. No focal airspace infiltrate to suggest pneumonia. No pleural effusion, pneumothorax or cavitary pulmonary nodules. Upper Abdomen: No acute abnormality. Incidental note made of replaced lateral segmental branch of the left hepatic artery. Musculoskeletal: No chest wall abnormality. No acute or significant osseous findings. Additional separately reconstructed dedicated CT images of the thoracic spine were also evaluated. No evidence of fracture, malalignment or significant degenerative change. No level specific degenerative changes. No destructive changes, endplate sclerosis or soft tissue abnormality to suggest osteomyelitis/discitis. Review of the MIP images confirms the above findings. IMPRESSION: 1. Mild dependent bibasilar airspace opacities with associated volume loss favored to reflect atelectasis. Small volume aspiration is Cadie Sorci consideration in the appropriate clinical context. 2. Otherwise, no acute cardiopulmonary process. 3. No evidence of fracture, malalignment or destructive process within the thoracic spine. Electronically Signed   By: Malachy Moan M.D.   On: 12/15/2021 10:17   CT T-SPINE NO CHARGE  Result Date: 12/15/2021 CLINICAL DATA:  Chest pain, shortness of breath with pleuritic chest pain EXAM: CT ANGIOGRAPHY CHEST WITH CONTRAST TECHNIQUE: Multidetector CT imaging of the chest was performed using the standard protocol during bolus administration of intravenous contrast. Multiplanar CT image reconstructions and MIPs were obtained to evaluate the vascular anatomy. Additional CT imaging and reformats of the thoracic spine were performed. RADIATION DOSE REDUCTION: This exam was performed according to the departmental dose-optimization program which includes automated exposure control, adjustment of the mA and/or kV according to patient size and/or use of iterative  reconstruction technique. CONTRAST:  OMNIPAQUE IOHEXOL 350 MG/ML SOLN COMPARISON:  CT scan of the thoracic spine FINDINGS: Cardiovascular: Satisfactory opacification of the pulmonary arteries to the segmental level. No evidence of pulmonary embolism. Normal heart size. No pericardial effusion. Mediastinum/Nodes: No enlarged mediastinal, hilar, or axillary lymph nodes. Thyroid gland, trachea, and esophagus demonstrate no significant findings. Lungs/Pleura: Mild volume loss with patchy bibasilar dependent airspace opacities. Findings are most suggestive of atelectasis. No focal airspace infiltrate to suggest pneumonia. No pleural effusion, pneumothorax or cavitary pulmonary nodules. Upper Abdomen: No acute abnormality. Incidental note made of replaced lateral segmental branch of the left hepatic artery. Musculoskeletal: No chest wall abnormality. No acute or significant osseous findings. Additional separately reconstructed dedicated CT images of the thoracic spine were also evaluated. No evidence of fracture, malalignment or significant degenerative change. No level specific degenerative changes. No destructive changes, endplate sclerosis or soft tissue abnormality to suggest osteomyelitis/discitis. Review of the MIP images confirms the above  findings. IMPRESSION: 1. Mild dependent bibasilar airspace opacities with associated volume loss favored to reflect atelectasis. Small volume aspiration is Garey Alleva consideration in the appropriate clinical context. 2. Otherwise, no acute cardiopulmonary process. 3. No evidence of fracture, malalignment or destructive process within the thoracic spine. Electronically Signed   By: Malachy Moan M.D.   On: 12/15/2021 10:17   DG CHEST PORT 1 VIEW  Result Date: 12/15/2021 CLINICAL DATA:  Dyspnea EXAM: PORTABLE CHEST 1 VIEW COMPARISON:  09/29/2021 FINDINGS: Lungs are clear.  No pleural effusion or pneumothorax. The heart is normal in size. IMPRESSION: No evidence of acute  cardiopulmonary disease. Electronically Signed   By: Charline Bills M.D.   On: 12/15/2021 00:16   DG Hand Complete Right  Result Date: 12/14/2021 CLINICAL DATA:  Pain and swelling. EXAM: RIGHT HAND - COMPLETE 3+ VIEW COMPARISON:  None Available. FINDINGS: The joint spaces are maintained. No acute bony findings or destructive bony changes. There is massive soft tissue swelling noted on the dorsum of the hand. No gas is seen in the soft tissues. No radiopaque foreign body. IMPRESSION: Massive soft tissue swelling but no acute bony findings. Electronically Signed   By: Rudie Meyer M.D.   On: 12/14/2021 17:45   [place] RT UPPER EXTREM LTD SOFT TISSUE NON VASCULAR  Result Date: 12/15/2021 CLINICAL DATA:  31 year old with right upper extremity abscesses. EXAM: ULTRASOUND RIGHT UPPER EXTREMITY LIMITED TECHNIQUE: Ultrasound examination of the upper extremity soft tissues was performed in the area of clinical concern. COMPARISON:  None Available. FINDINGS: Complex hypoechoic heterogeneous structure in the dorsum of the right hand. This is most compatible with Avleen Bordwell complex fluid collection and abscess. The right hand structure measures 4.4 x 2.0 x 3.6 cm. Structure has peripheral blood flow but no significant internal vascularity. There is Abrahim Sargent smaller heterogeneous complex fluid collection in the anterior right shoulder region. Right shoulder structure measures 2.5 x 1.3 x 2.3 cm. This shoulder collection is superficial with peripheral vascularity. IMPRESSION: Heterogeneous collection along the dorsum of the right hand and smaller collection in the anterior right shoulder. Findings are most compatible with superficial abscess collections. Electronically Signed   By: Richarda Overlie M.D.   On: 12/15/2021 10:51     Scheduled Meds:  acetaminophen  1,000 mg Oral Q8H   mupirocin ointment  1 application. Nasal BID   sodium chloride (PF)       Continuous Infusions:  azithromycin Stopped (12/15/21 0738)   lactated ringers 100  mL/hr at 12/15/21 1542   vancomycin 1,250 mg (12/15/21 1057)     LOS: 1 day    Time spent: over 30 min    Lacretia Nicks, MD [facility]   To contact the attending provider between 7A-7P or the covering provider during after hours 7P-7A, please log into the web site [email or web address] and access using universal [facility] password for that web site. If you do not have the password, please call the hospital operator.  12/15/2021, 6:33 PM

## 2021-12-15 NOTE — Progress Notes (Signed)
       CROSS COVER NOTE  NAME: Stephen Shepard MRN: [id number] DOB : 09/09/1990  Mr Stephen Shepard reports 10/10 central chest pain that feels like an elephant is sitting on his chest. He denies nausea, vomiting, dyspnea, palpitations.   Plan:  EKG Troponin Nitroglycerin GI Cocktail  Bishop Limbo DNP, MHA, FNP-BC Nurse Practitioner [facility] Pager [phone]

## 2021-12-15 NOTE — Anesthesia Preprocedure Evaluation (Signed)
Anesthesia Evaluation  Patient identified by MRN, date of birth, ID band Patient awake    Reviewed: Allergy & Precautions, NPO status , Patient's Chart, lab work & pertinent test results  Airway Mallampati: II  TM Distance: >3 FB Neck ROM: Full    Dental no notable dental hx. (+) Dental Advisory Given   Pulmonary asthma , Current Smoker and Patient abstained from smoking.,    Pulmonary exam normal breath sounds clear to auscultation       Cardiovascular negative cardio ROS Normal cardiovascular exam Rhythm:Regular Rate:Normal     Neuro/Psych Seizures -, Well Controlled,  Anxiety    GI/Hepatic negative GI ROS, (+)     substance abuse  IV drug use,   Endo/Other  negative endocrine ROS  Renal/GU Renal disease  negative genitourinary   Musculoskeletal  (+) narcotic dependentRight hand abscess Skin Abscess right shoulder   Abdominal   Peds  Hematology negative hematology ROS (+)   Anesthesia Other Findings   Reproductive/Obstetrics                             Anesthesia Physical Anesthesia Plan  ASA: 2  Anesthesia Plan: General   Post-op Pain Management:    Induction: Intravenous  PONV Risk Score and Plan: 2 and Treatment may vary due to age or medical condition  Airway Management Planned: LMA  Additional Equipment: None  Intra-op Plan:   Post-operative Plan: Extubation in OR  Informed Consent: I have reviewed the patients History and Physical, chart, labs and discussed the procedure including the risks, benefits and alternatives for the proposed anesthesia with the patient or authorized representative who has indicated his/her understanding and acceptance.     Dental advisory given  Plan Discussed with: CRNA and Anesthesiologist  Anesthesia Plan Comments:         Anesthesia Quick Evaluation

## 2021-12-16 ENCOUNTER — Inpatient Hospital Stay (HOSPITAL_COMMUNITY): Payer: Self-pay

## 2021-12-16 ENCOUNTER — Encounter (HOSPITAL_COMMUNITY): Payer: Self-pay | Admitting: Orthopedic Surgery

## 2021-12-16 DIAGNOSIS — B1920 Unspecified viral hepatitis C without hepatic coma: Secondary | ICD-10-CM | POA: Insufficient documentation

## 2021-12-16 DIAGNOSIS — R768 Other specified abnormal immunological findings in serum: Secondary | ICD-10-CM

## 2021-12-16 DIAGNOSIS — R079 Chest pain, unspecified: Secondary | ICD-10-CM

## 2021-12-16 LAB — HEPATITIS PANEL, ACUTE
HCV Ab: REACTIVE — AB
Hep A IgM: NONREACTIVE
Hep B C IgM: NONREACTIVE
Hepatitis B Surface Ag: NONREACTIVE

## 2021-12-16 LAB — CREATININE, SERUM
Creatinine, Ser: 1.08 mg/dL (ref 0.61–1.24)
GFR, Estimated: >60 mL/min (ref >60)

## 2021-12-16 LAB — TROPONIN I (HIGH SENSITIVITY): Troponin I (High Sensitivity): 4 ng/L (ref <18)

## 2021-12-16 IMAGING — MR MR THORACIC SPINE WO/W CM
10 of 11 series · 35 of 48 positions shown · IV contrast (gadavist)
Comparison: None Available.

CLINICAL DATA: Low back pain with suspected infection. IV drug
abuse.

EXAM:
MRI THORACIC AND LUMBAR SPINE WITHOUT AND WITH CONTRAST
TECHNIQUE: Multiplanar and multiecho pulse sequences of the thoracic and lumbar
spine were obtained without and with intravenous contrast.
CONTRAST:  6mL GADAVIST GADOBUTROL 1 MMOL/ML IV SOLN

[Series 16: T1 · sagittal · 4.0mm · 1.72mm/px · 2 of 5 slices shown (1 of 4)]
[im 1/5]
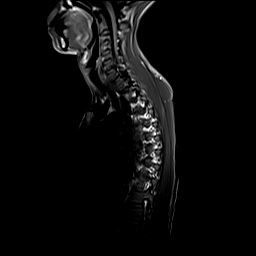
[im 5/5]
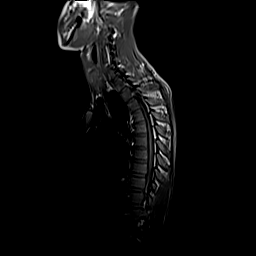

[Series 17: STIR · sagittal · 3.0mm · 1.00mm/px · 3 of 15 slices shown]
[im 1/15]
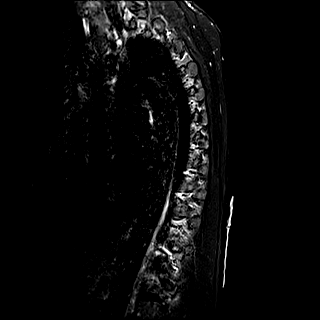
[im 8/15]
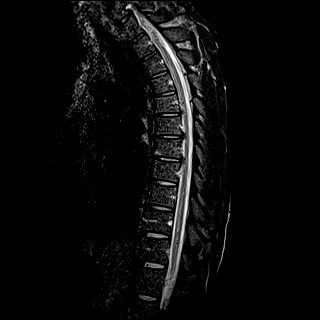
[im 15/15]
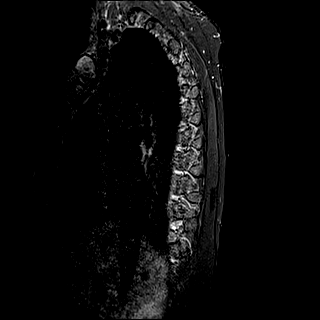

[Series 18: T1 · sagittal · 3.0mm · 1.00mm/px · 3 of 15 slices shown (2 of 4)]
[im 1/15]
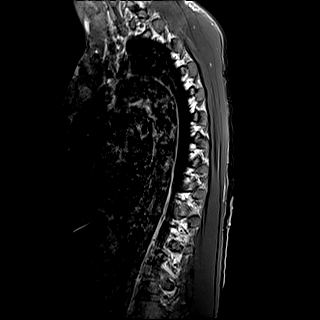
[im 8/15]
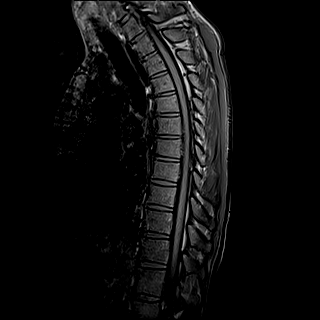
[im 15/15]
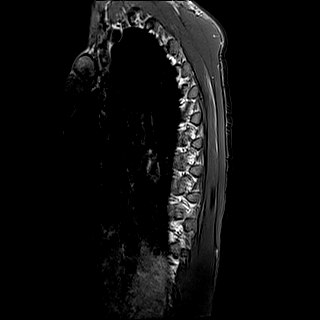

[Series 19: T2 · axial · 4.0mm · 0.78mm/px · z∈[-303,-70]mm · 7 of 40 slices shown]
[im 1/40]
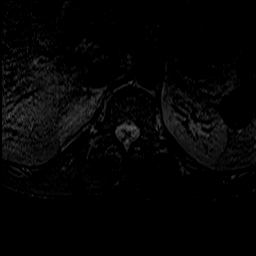
[im 7/40]
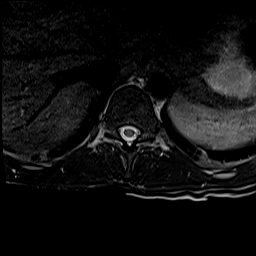
[im 14/40]
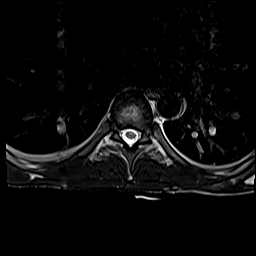
[im 20/40]
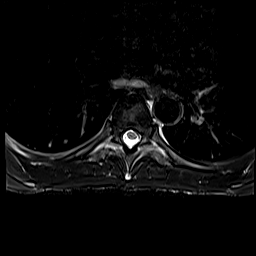
[im 27/40]
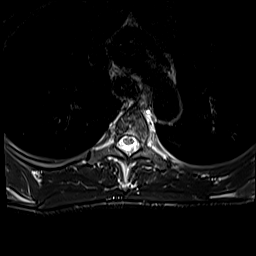
[im 33/40]
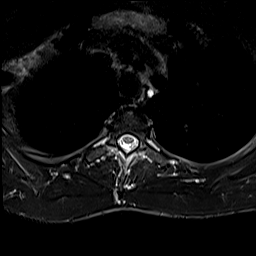
[im 40/40]
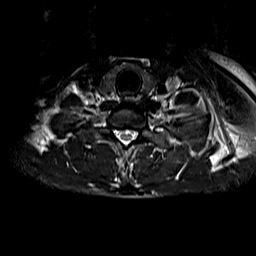

[Series 21: T1 · axial · 4.0mm · 0.39mm/px · z∈[-303,-70]mm · 7 of 40 slices shown (3 of 4)]
[im 1/40]
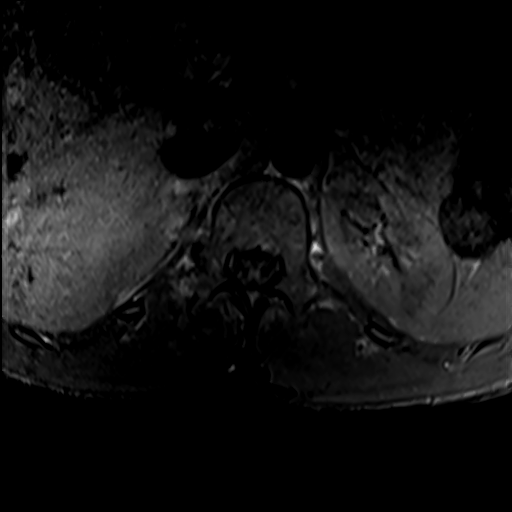
[im 7/40]
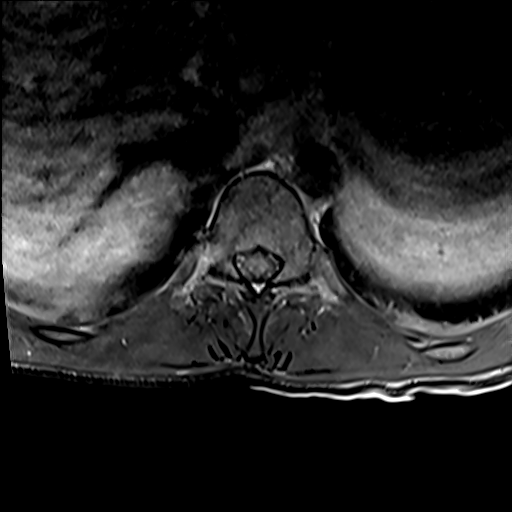
[im 14/40]
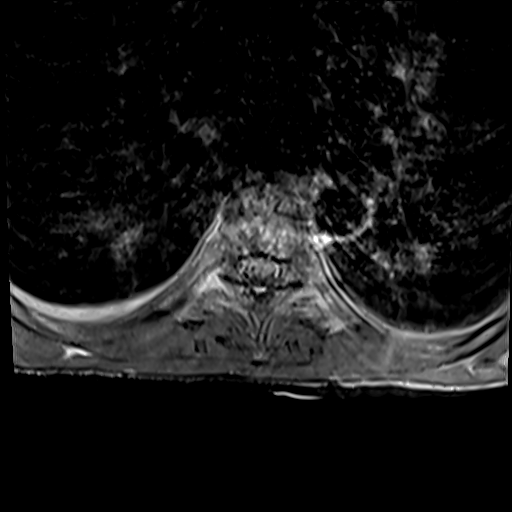
[im 20/40]
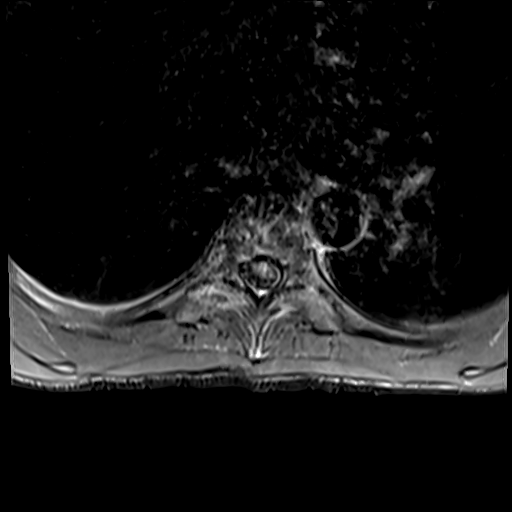
[im 27/40]
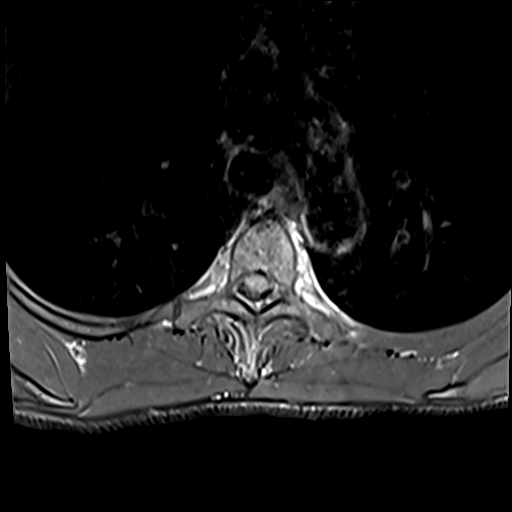
[im 33/40]
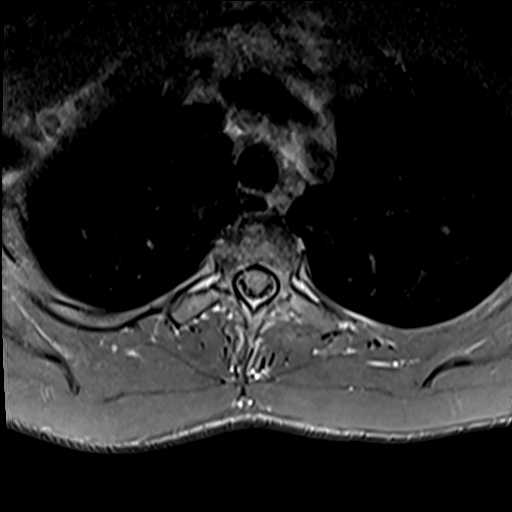
[im 40/40]
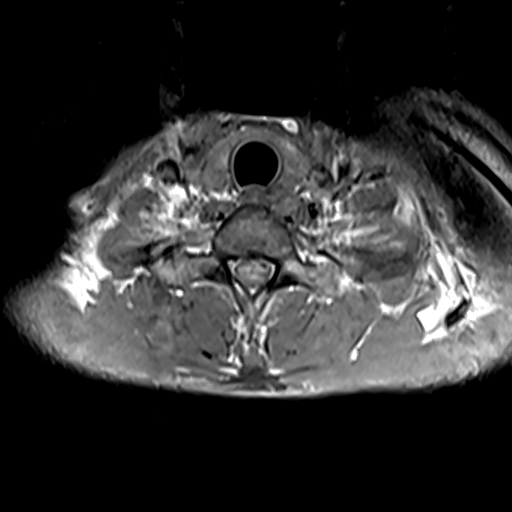

[Series 22: T1 · sagittal · 4.0mm · 0.81mm/px · 3 of 17 slices shown (4 of 4)]
[im 1/17]
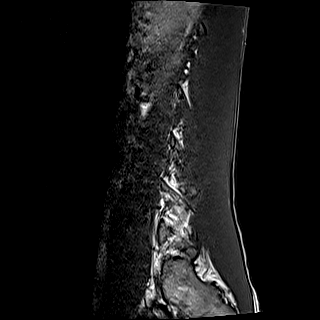
[im 9/17]
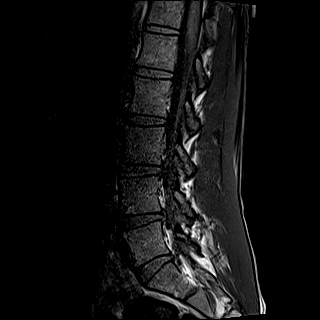
[im 17/17]
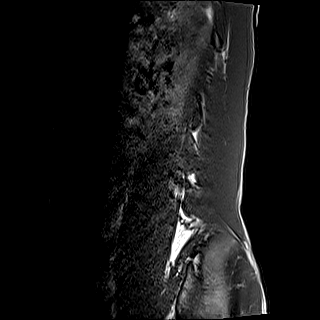

[Series 23: T2 post-contrast · sagittal · 3.0mm · 0.83mm/px · 3 of 15 slices shown]
[im 1/15]
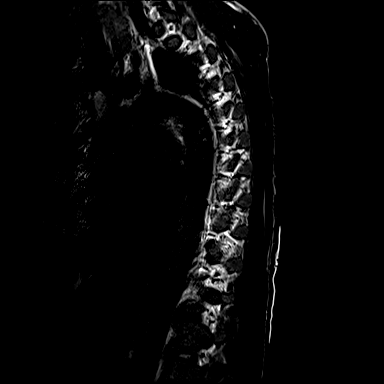
[im 8/15]
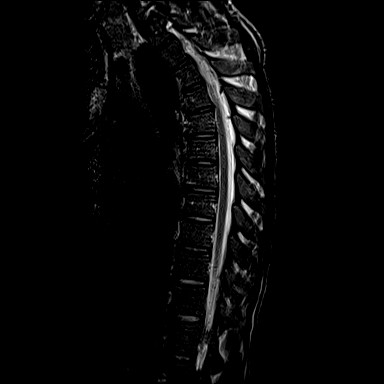
[im 15/15]
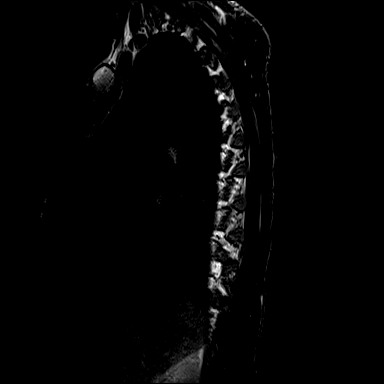

[Series 24: T1 fat-sat post-contrast · sagittal · 3.0mm · 1.00mm/px · 3 of 15 slices shown (1 of 2)]
[im 1/15]
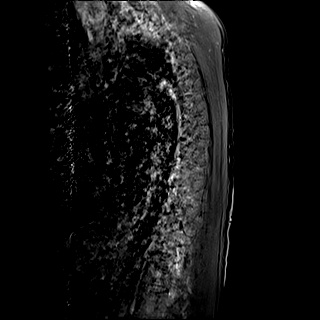
[im 8/15]
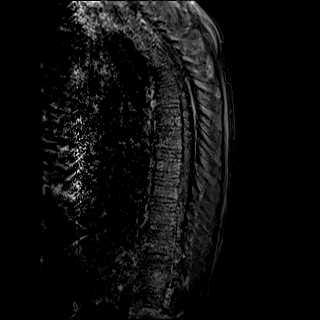
[im 15/15]
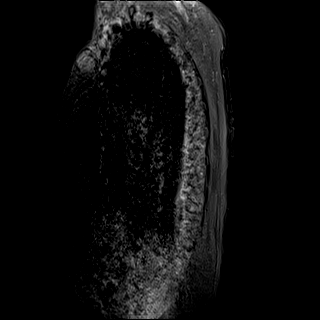

[Series 25: T1 post-contrast · axial · 4.0mm · 0.39mm/px · 1 of 40 slices shown]
[im 1/40]
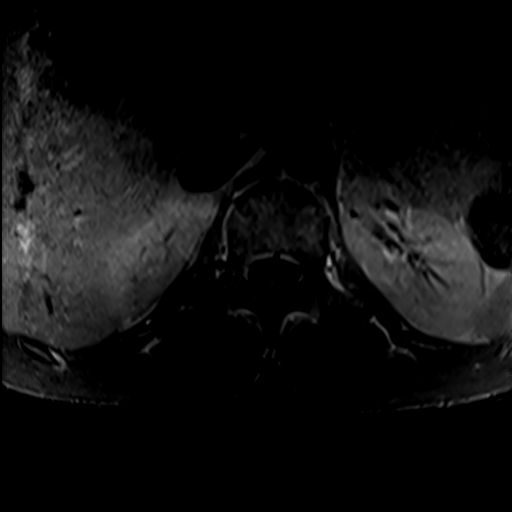

[Series 26: T1 fat-sat post-contrast · sagittal · 3.0mm · 1.25mm/px · 3 of 15 slices shown (2 of 2)]
[im 1/15]
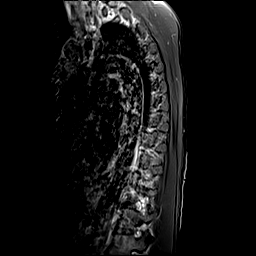
[im 8/15]
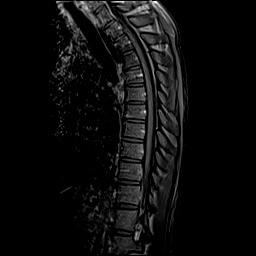
[im 15/15]
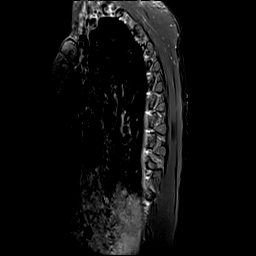

[35 of 48 positions shown; findings below may reference images not displayed]

FINDINGS: MRI THORACIC SPINE FINDINGS

Alignment:  Physiologic.

Vertebrae: No fracture, evidence of discitis, or bone lesion.

Cord:  Normal signal and morphology.

Paraspinal and other soft tissues: Negative.

Disc levels:

Small left subarticular disc protrusion at T6-7 without spinal canal
stenosis.

The other thoracic disc levels are normal.

Postcontrast images of the thoracic spine are motion degraded but
within that limitation there is no enhancing abnormality.

MRI LUMBAR SPINE FINDINGS

Segmentation:  Standard.

Alignment:  Physiologic.

Vertebrae:  No fracture, evidence of discitis, or bone lesion.

Conus medullaris: Extends to the L1 level and appears normal.

Paraspinal and other soft tissues: Normal

Disc levels:

At L4-5, there is a small left subarticular disc protrusion mildly
narrowing the left lateral recess. No central spinal canal or neural
foraminal stenosis.

The other lumbar disc levels are normal.
IMPRESSION: 1. No evidence of discitis-osteomyelitis or epidural abscess.
2. Small left subarticular disc protrusion at T6-7 without spinal
canal stenosis.
3. Small left subarticular disc protrusion at L4-5 mildly narrowing
the left lateral recess. Otherwise normal thoracic and lumbar spine.

## 2021-12-16 IMAGING — MR MR LUMBAR SPINE WO/W CM
5 of 7 series · 33 of 48 positions shown · IV contrast (gadavist)
Comparison: None Available.

CLINICAL DATA: Low back pain with suspected infection. IV drug
abuse.

EXAM:
MRI THORACIC AND LUMBAR SPINE WITHOUT AND WITH CONTRAST
TECHNIQUE: Multiplanar and multiecho pulse sequences of the thoracic and lumbar
spine were obtained without and with intravenous contrast.
CONTRAST:  6mL GADAVIST GADOBUTROL 1 MMOL/ML IV SOLN

[Series 17: T2 · sagittal · 4.0mm · 0.81mm/px · 3 of 17 slices shown (1 of 2)]
[im 1/17]
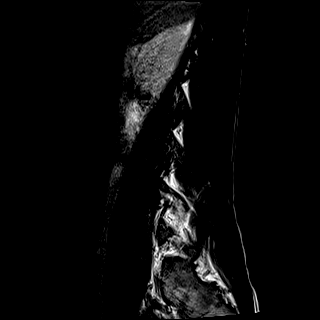
[im 9/17]
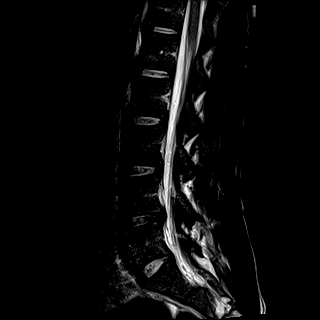
[im 17/17]
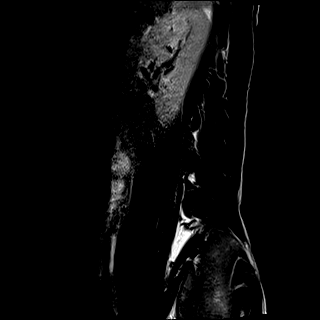

[Series 19: T2 · axial · 4.0mm · 0.78mm/px · z∈[-545,-288]mm · 11 of 45 slices shown (2 of 2)]
[im 1/45]
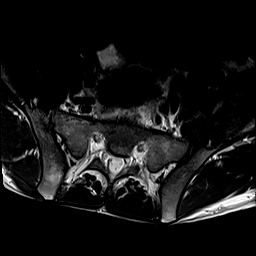
[im 5/45]
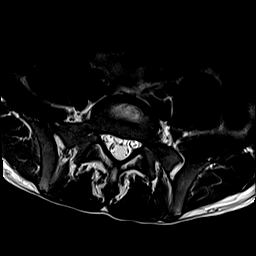
[im 9/45]
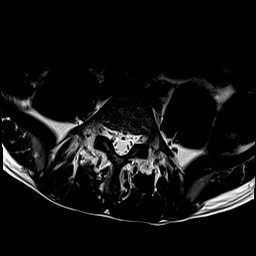
[im 14/45]
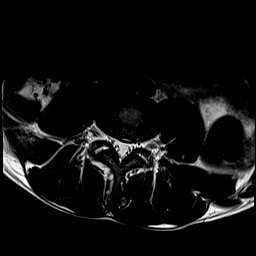
[im 18/45]
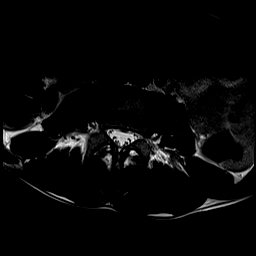
[im 23/45]
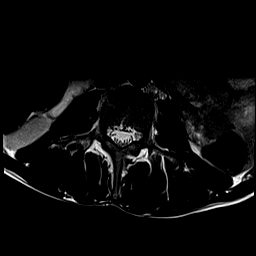
[im 27/45]
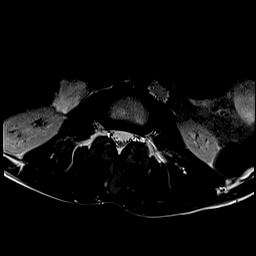
[im 31/45]
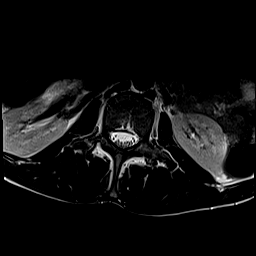
[im 36/45]
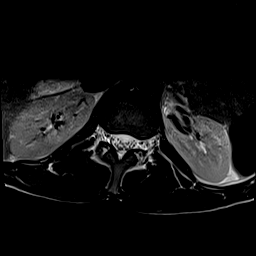
[im 40/45]
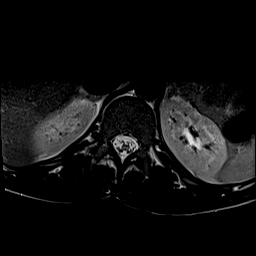
[im 45/45]
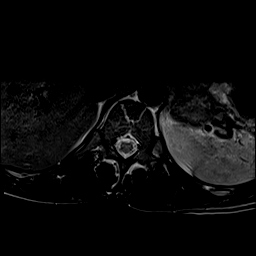

[Series 20: T1 · axial · 4.0mm · 0.39mm/px · z∈[-545,-288]mm · 11 of 45 slices shown]
[im 1/45]
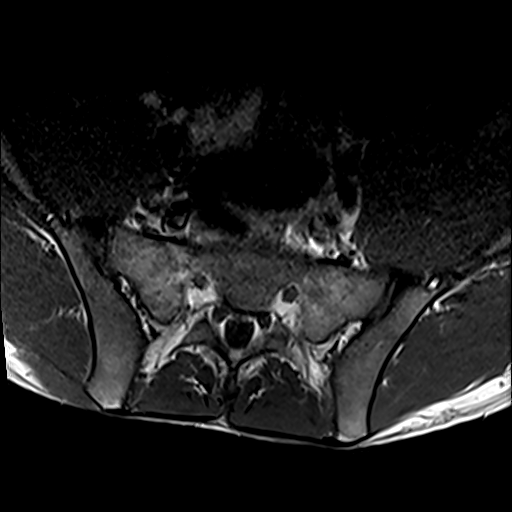
[im 5/45]
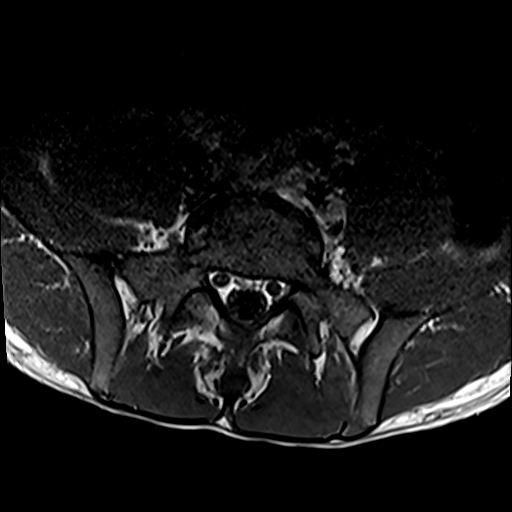
[im 9/45]
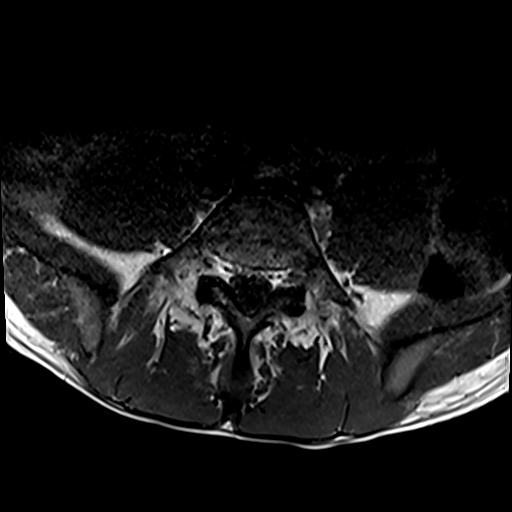
[im 14/45]
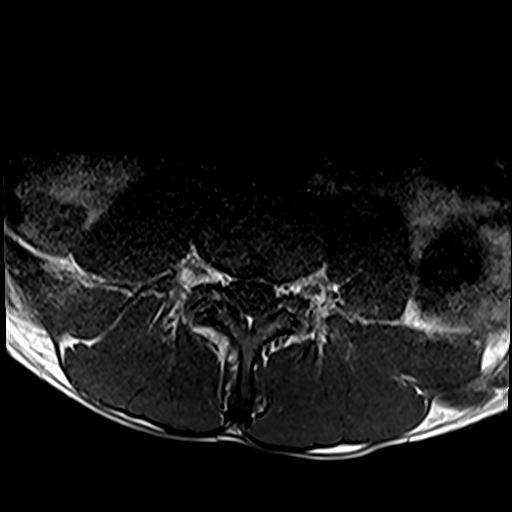
[im 18/45]
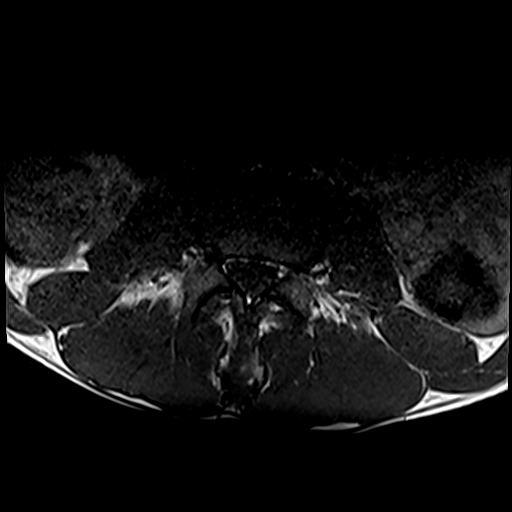
[im 23/45]
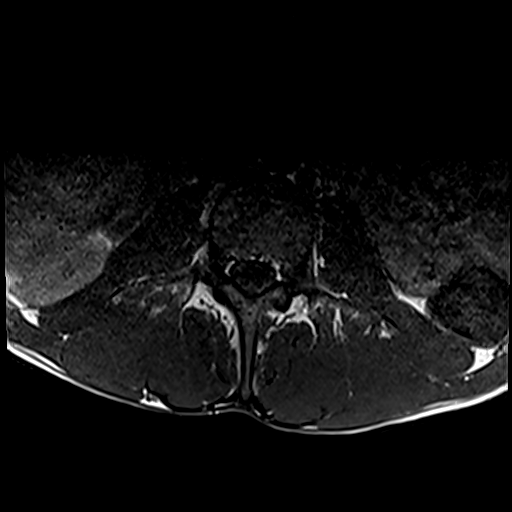
[im 27/45]
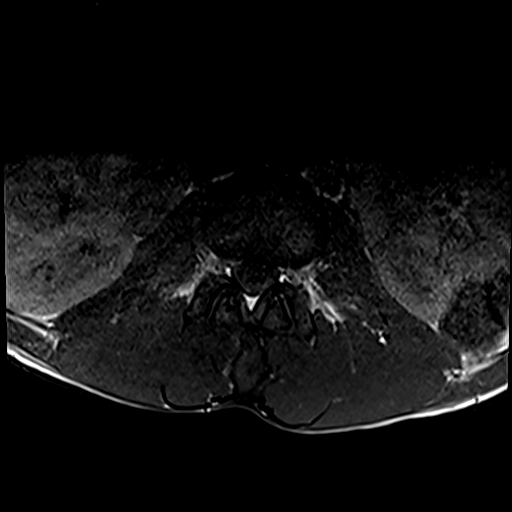
[im 31/45]
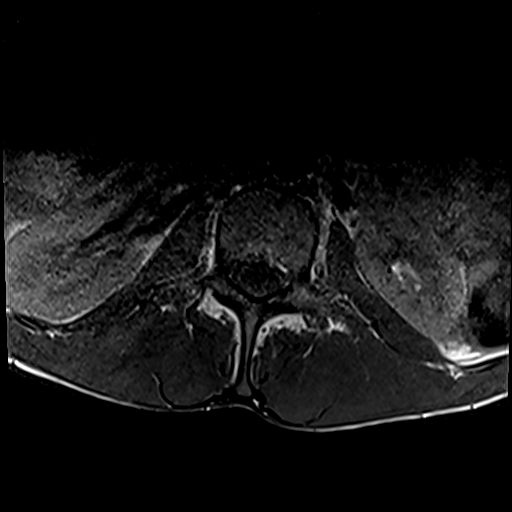
[im 36/45]
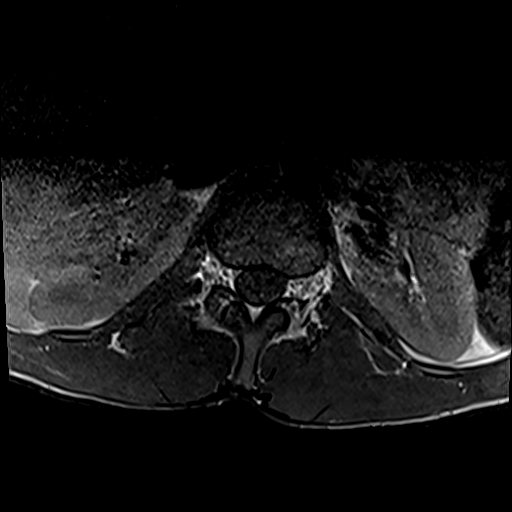
[im 40/45]
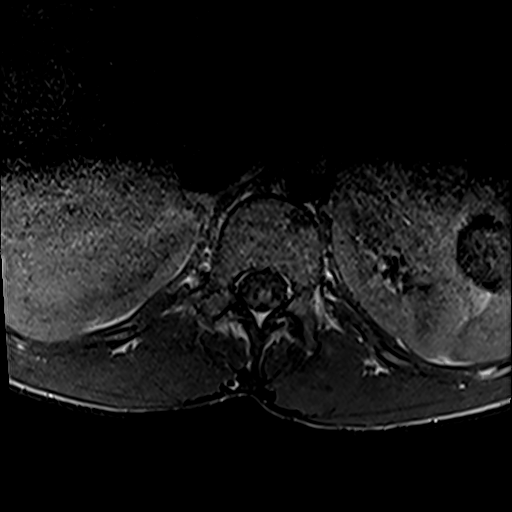
[im 45/45]
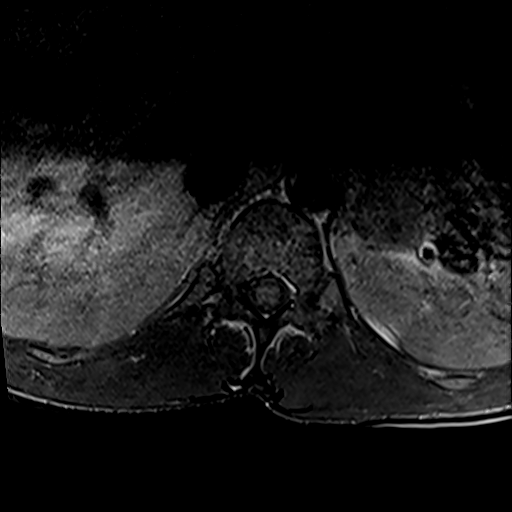

[Series 21: T2 post-contrast · sagittal · 4.0mm · 0.81mm/px · 4 of 17 slices shown]
[im 1/17]
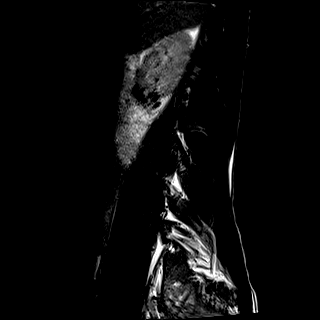
[im 6/17]
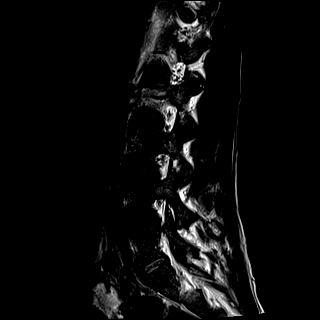
[im 11/17]
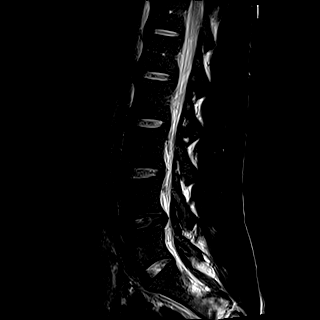
[im 17/17]
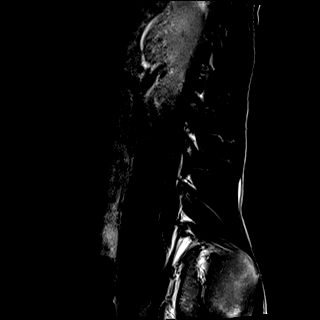

[Series 22: T1 fat-sat post-contrast · sagittal · 4.0mm · 0.81mm/px · 4 of 17 slices shown]
[im 1/17]
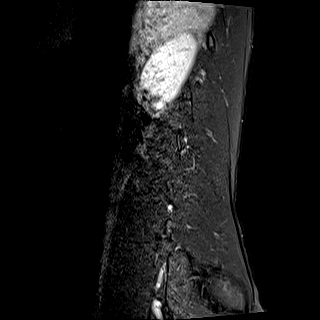
[im 6/17]
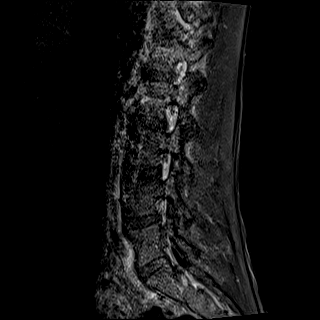
[im 11/17]
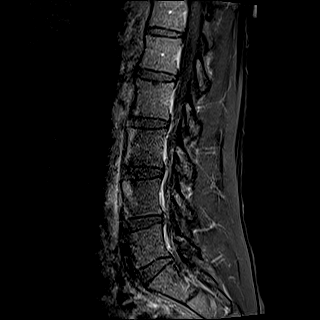
[im 17/17]
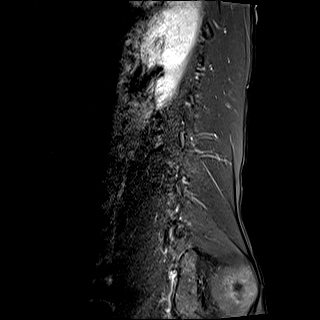

[33 of 48 positions shown; findings below may reference images not displayed]

FINDINGS: MRI THORACIC SPINE FINDINGS

Alignment:  Physiologic.

Vertebrae: No fracture, evidence of discitis, or bone lesion.

Cord:  Normal signal and morphology.

Paraspinal and other soft tissues: Negative.

Disc levels:

Small left subarticular disc protrusion at T6-7 without spinal canal
stenosis.

The other thoracic disc levels are normal.

Postcontrast images of the thoracic spine are motion degraded but
within that limitation there is no enhancing abnormality.

MRI LUMBAR SPINE FINDINGS

Segmentation:  Standard.

Alignment:  Physiologic.

Vertebrae:  No fracture, evidence of discitis, or bone lesion.

Conus medullaris: Extends to the L1 level and appears normal.

Paraspinal and other soft tissues: Normal

Disc levels:

At L4-5, there is a small left subarticular disc protrusion mildly
narrowing the left lateral recess. No central spinal canal or neural
foraminal stenosis.

The other lumbar disc levels are normal.
IMPRESSION: 1. No evidence of discitis-osteomyelitis or epidural abscess.
2. Small left subarticular disc protrusion at T6-7 without spinal
canal stenosis.
3. Small left subarticular disc protrusion at L4-5 mildly narrowing
the left lateral recess. Otherwise normal thoracic and lumbar spine.

## 2021-12-16 IMAGING — MR MR LUMBAR SPINE WO/W CM
1 series · 17 of 17 positions shown · IV contrast (gadavist)
Comparison: None Available.

CLINICAL DATA: Low back pain with suspected infection. IV drug
abuse.

EXAM:
MRI THORACIC AND LUMBAR SPINE WITHOUT AND WITH CONTRAST
TECHNIQUE: Multiplanar and multiecho pulse sequences of the thoracic and lumbar
spine were obtained without and with intravenous contrast.
CONTRAST:  6mL GADAVIST GADOBUTROL 1 MMOL/ML IV SOLN

[Series 22: T1 · sagittal · 4.0mm · 0.81mm/px · 17 of 17 slices shown]
[im 1/17]
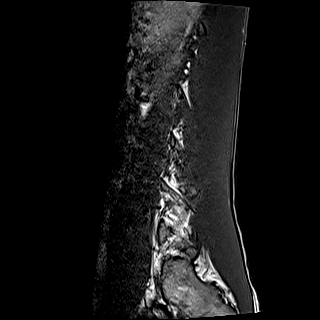
[im 2/17]
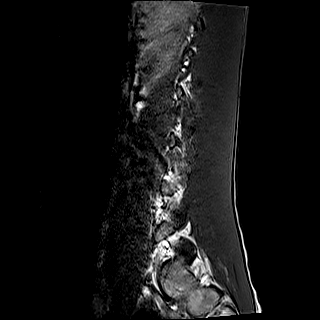
[im 3/17]
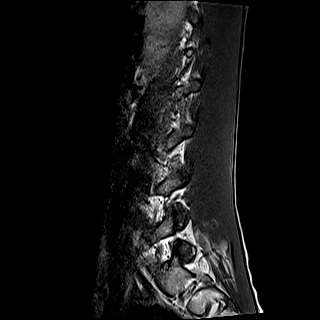
[im 4/17]
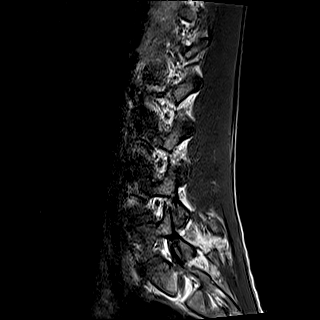
[im 5/17]
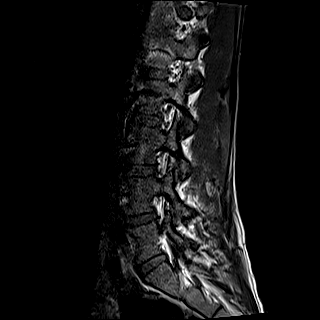
[im 6/17]
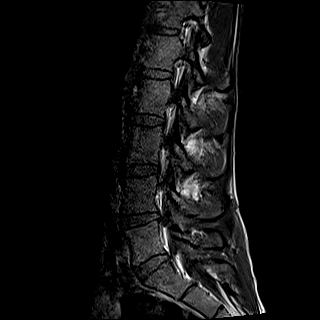
[im 7/17]
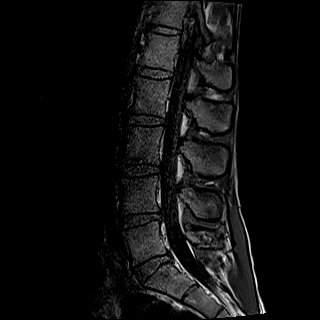
[im 8/17]
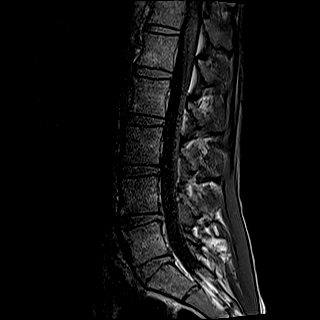
[im 9/17]
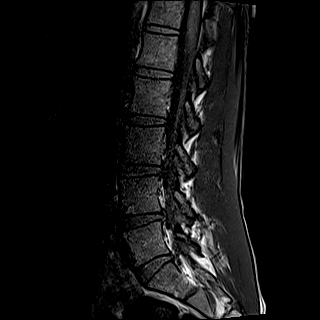
[im 10/17]
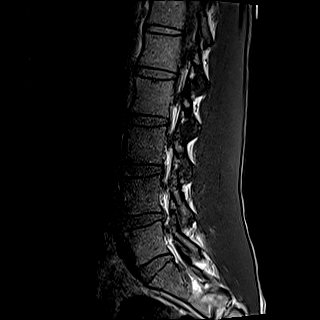
[im 11/17]
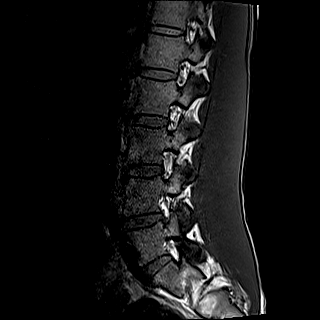
[im 12/17]
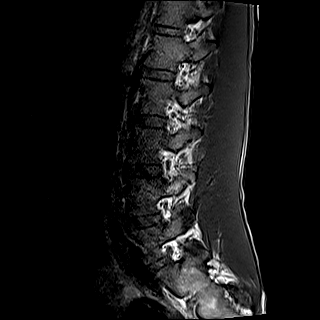
[im 13/17]
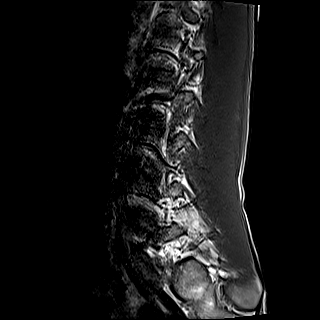
[im 14/17]
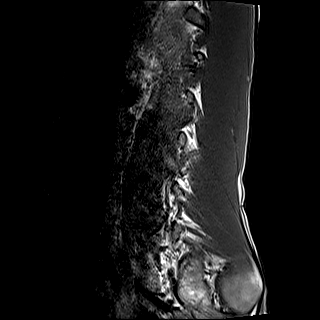
[im 15/17]
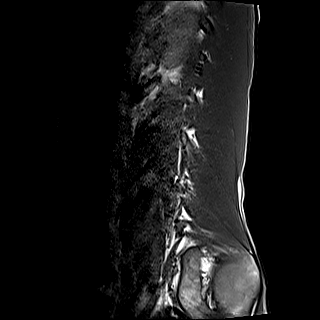
[im 16/17]
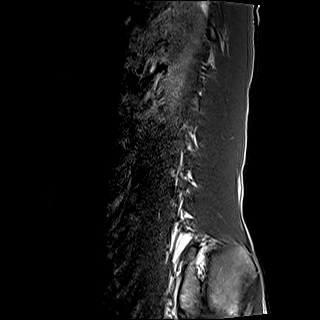
[im 17/17]
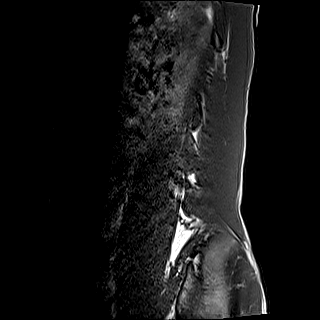

[17 of 17 positions shown; findings below may reference images not displayed]

FINDINGS: MRI THORACIC SPINE FINDINGS

Alignment:  Physiologic.

Vertebrae: No fracture, evidence of discitis, or bone lesion.

Cord:  Normal signal and morphology.

Paraspinal and other soft tissues: Negative.

Disc levels:

Small left subarticular disc protrusion at T6-7 without spinal canal
stenosis.

The other thoracic disc levels are normal.

Postcontrast images of the thoracic spine are motion degraded but
within that limitation there is no enhancing abnormality.

MRI LUMBAR SPINE FINDINGS

Segmentation:  Standard.

Alignment:  Physiologic.

Vertebrae:  No fracture, evidence of discitis, or bone lesion.

Conus medullaris: Extends to the L1 level and appears normal.

Paraspinal and other soft tissues: Normal

Disc levels:

At L4-5, there is a small left subarticular disc protrusion mildly
narrowing the left lateral recess. No central spinal canal or neural
foraminal stenosis.

The other lumbar disc levels are normal.
IMPRESSION: 1. No evidence of discitis-osteomyelitis or epidural abscess.
2. Small left subarticular disc protrusion at T6-7 without spinal
canal stenosis.
3. Small left subarticular disc protrusion at L4-5 mildly narrowing
the left lateral recess. Otherwise normal thoracic and lumbar spine.

## 2021-12-16 MED ORDER — GADOBUTROL 1 MMOL/ML IV SOLN
6.0000 mL | Freq: Once | INTRAVENOUS | Status: AC | PRN
  Administered 2021-12-16: 6 mL via INTRAVENOUS

## 2021-12-16 MED ORDER — NALOXONE HCL 0.4 MG/ML IJ SOLN: 0.4000 mg | INTRAMUSCULAR | Status: DC | PRN

## 2021-12-16 MED ORDER — SODIUM CHLORIDE 0.9 % IV SOLN
1.0000 g | INTRAVENOUS | Status: DC
  Administered 2021-12-16 – 2021-12-19 (×4): 1 g via INTRAVENOUS
  Filled 2021-12-16 (×4): qty 10

## 2021-12-16 MED ORDER — LORAZEPAM 2 MG/ML IJ SOLN
1.0000 mg | Freq: Once | INTRAMUSCULAR | Status: AC | PRN
  Administered 2021-12-16: 1 mg via INTRAVENOUS
  Filled 2021-12-16: qty 1

## 2021-12-16 MED ORDER — ONDANSETRON HCL 4 MG/2ML IJ SOLN
4.0000 mg | Freq: Once | INTRAMUSCULAR | Status: AC
Start: 2021-12-16 — End: 2021-12-16
  Administered 2021-12-16: 4 mg via INTRAVENOUS
  Filled 2021-12-16: qty 2

## 2021-12-16 NOTE — Progress Notes (Signed)
   12/15/21 2002  Assess: MEWS Score  Temp 97.8 F (36.6 C)  BP (!) 165/103  Pulse Rate (!) 55  Resp (!) 24  SpO2 98 %  Assess: MEWS Score  MEWS Temp 0  MEWS Systolic 0  MEWS Pulse 0  MEWS RR 1  MEWS LOC 1  MEWS Score 2  MEWS Score Color Yellow  Assess: if the MEWS score is Yellow or Red  Were vital signs taken at a resting state? Yes  Focused Assessment No change from prior assessment  Does the patient meet 2 or more of the SIRS criteria? Yes  Does the patient have a confirmed or suspected source of infection? Yes  Provider and Rapid Response Notified? Yes  MEWS guidelines implemented *See Row Information* Yes  Treat  Pain Scale 0-10  Pain Score 0  Take Vital Signs  Increase Vital Sign Frequency  Yellow: Q 2hr X 2 then Q 4hr X 2, if remains yellow, continue Q 4hrs  Escalate  MEWS: Escalate Yellow: discuss with charge nurse/RN and consider discussing with provider and RRT  Notify: Charge Nurse/RN  Name of Charge Nurse/RN Notified Jasen, RN  Date Charge Nurse/RN Notified 12/15/21  Time Charge Nurse/RN Notified 2005  Notify: Provider  Provider Name/Title Bishop Limbo, NP  Date Provider Notified 12/15/21  Time Provider Notified 2015  Method of Notification Page  Notification Reason Other (Comment) (mews yellow d/t increased RR)  Provider response Other (Comment) (awaiting response)  Document  Patient Outcome Stabilized after interventions  Progress note created (see row info) Yes  Assess: SIRS CRITERIA  SIRS Temperature  0  SIRS Pulse 0  SIRS Respirations  1  SIRS WBC 0  SIRS Score Sum  1   Mews yellow d/t increased RR. Consulting civil engineer and MD notified. Yellow mews initiated per protocol. Plan of care on going.

## 2021-12-16 NOTE — Assessment & Plan Note (Addendum)
negative troponin CT without PE Will ctm

## 2021-12-16 NOTE — Assessment & Plan Note (Addendum)
Follow outpatient  

## 2021-12-16 NOTE — Progress Notes (Signed)
PROGRESS NOTE    Stephen Lyoniah Kroboth  HQI:[id number] DOB: October 28, 1990 DOA: 12/14/2021 PCP: Patient, [name] (Inactive)  Chief Complaint  Patient presents with   Abscess    Brief Narrative:  Stephen Shepard is Stephen Shepard 31 y.o. male with medical history significant of seizure not on antiepileptics and history of IV heroin drug use who presents with concerns of worsening abscess.    Assessment & Plan:   Principal Problem:   Abscess of multiple sites Active Problems:   Shortness of breath   IVDU (intravenous drug user)   Back pain   AKI (acute kidney injury) (HCC)   Hepatitis C antibody positive in blood   Chest pain   Sepsis (HCC)   Assessment and Plan: * Abscess of multiple sites Sepsis related to multiple abscesses, tachycardic, leukocytosis at presentation -> ruled in R hand abscess and R shoulder abscess.  Several other locations appear to have spontaneously drained.  Several on L arm that appear to have spontaneously drained/improved.  US with heterogenous collection along dorsum of R hand and small collection in the anterior R shoulder S/p right shoulder incision and drainage subcutaneous abscess, R wrist and dorsum of the hand drainage of deep abscess, right hand fourth dorsal compartment tenosynovectomy, right hand third dorsal compartment tenosynovectomy, right hand second dorsal compartment tenosynovectomy on 5/18 Recommending daily packing changes to wound on shoulder and dry dressing on incision For R wrist and forearm, recommending keep splint in place and they'll see him back on Monday Follow operative cultures Blood cx pending Continue with IV vancomycin, add ceftriaxone.  IV azithromycin was on at admission with concern for cat sratch, but suspect more likely related to IVDU, will hold azithro for now and follow cx.   Will discuss with ID as well Suspect related to most likely related to IVDU - close attention to blood cultures given multiple abscesses.   Shortness of  breath CT PE protocol with mild dependent bibasilar airspace opacities with associated volume loss favored to reflect atelectasis, small volume aspiration is Stephen Shepard consideration follow  Back pain C/o thoracic back pain Plain films unremarkable CT t spine without evidence of fracture, malalignment, or destructive process within thoracic spine C/o continued pain today, follow MRI T/L spine No LE weakness or saddle anesthesia   IVDU (intravenous drug user) Reports last heroin use Stephen Shepard month ago -negative HIV -follow acute hepatitis panel   AKI (acute kidney injury) (HCC) Resolved, follow  Hepatitis C antibody positive in blood Follow PCR  Chest pain Occurred last night, negative troponin CT done yesterday without PE Will ctm       DVT prophylaxis: lovenox Code Status: full Family Communication: nne Disposition:   Status is: Inpatient Remains inpatient appropriate because: need for IV abx, hand surgery clearnace   Consultants:  ortho  Procedures:  As above  Antimicrobials:  Anti-infectives (From admission, onward)    Start     Dose/Rate Route Frequency Ordered Stop   12/16/21 1000  cefTRIAXone (ROCEPHIN) 1 g in sodium chloride 0.9 % 100 mL IVPB        1 g 200 mL/hr over 30 Minutes Intravenous Every 24 hours 12/16/21 0817     12/15/21 2000  vancomycin (VANCOREADY) IVPB 1750 mg/350 mL  Status:  Discontinued        1,750 mg 175 mL/hr over 120 Minutes Intravenous Every 24 hours 12/14/21 2351 12/15/21 0953   12/15/21 1215  ceFAZolin (ANCEF) IVPB 2g/100 mL premix        2 g 200  mL/hr over 30 Minutes Intravenous On call to O.R. 12/15/21 1206 12/15/21 1635   12/15/21 1000  vancomycin (VANCOREADY) IVPB 1250 mg/250 mL        1,250 mg 166.7 mL/hr over 90 Minutes Intravenous Every 12 hours 12/15/21 0954     12/14/21 2345  azithromycin (ZITHROMAX) 500 mg in sodium chloride 0.9 % 250 mL IVPB  Status:  Discontinued        500 mg 250 mL/hr over 60 Minutes Intravenous Every 24  hours 12/14/21 2337 12/16/21 1901   12/14/21 1930  vancomycin (VANCOCIN) IVPB 1000 mg/200 mL premix        1,000 mg 200 mL/hr over 60 Minutes Intravenous  Once 12/14/21 1920 12/14/21 2352       Subjective: C/o back pain  Objective: Vitals:   12/16/21 0401 12/16/21 0801 12/16/21 1000 12/16/21 1426  BP: (!) 145/91 (!) 140/92  (!) 142/96  Pulse: (!) 53 62  (!) 52  Resp: Temp: 98.9 F (37.2 C) (!) 100.4 F (38 C) 99 F (37.2 C) (!) 100.6 F (38.1 C)  TempSrc: Oral Oral Oral Oral  SpO2: 100% 100%  97%  Weight:      Height:        Intake/Output Summary (Last 24 hours) at 12/16/2021 1906 Last data filed at 12/16/2021 1432 Gross per 24 hour  Intake 355 ml  Output 1800 ml  Net -1445 ml   Filed Weights   12/14/21 1622 12/15/21 1207  Weight: 74.8 kg 64.3 kg    Examination:  General: uncomfortable Cardiovascular: RRR Lungs: unlabored Abdomen: Soft, nontender, nondistended Neurological: moving all extremities - seems sedated Extremities: Dressing to R shoulder intact, R splint intact   Data Reviewed: I have personally reviewed following labs and imaging studies  CBC: Recent Labs  Lab 12/14/21 1640 12/15/21 0424  WBC 15.7* 15.0*  NEUTROABS 12.3*  --   HGB 13.8 12.7*  HCT 42.5 38.1*  MCV 87.1 85.0  PLT 435* 369    Basic Metabolic Panel: Recent Labs  Lab 12/14/21 1640 12/15/21 0424 12/16/21 0731  NA 139 139  --   K 3.8 4.1  --   CL 99 103  --   CO2 30 29  --   GLUCOSE 111* 114*  --   BUN 21* 15  --   CREATININE 1.61* 1.00 1.08  CALCIUM 10.1 9.2  --     GFR: Estimated Creatinine Clearance: 89.3 mL/min (by C-G formula based on SCr of 1.08 mg/dL).  Liver Function Tests: No results for input(s): AST, ALT, ALKPHOS, BILITOT, PROT, ALBUMIN in the last 168 hours.  CBG: No results for input(s): GLUCAP in the last 168 hours.   Recent Results (from the past 240 hour(s))  Blood culture (routine x 2)     Status: None (Preliminary result)    Collection Time: 12/14/21  5:25 PM   Specimen: BLOOD  Result Value Ref Range Status   Specimen Description   Final    BLOOD LEFT ANTECUBITAL Performed at [facility], [address] [place], [place] [address]    Special Requests   Final    BOTTLES DRAWN AEROBIC AND ANAEROBIC Blood Culture adequate volume Performed at [facility], [address], [place], [place] [address]    Culture   Final    NO GROWTH 2 DAYS Performed at [facility], [address] [place], [place] [address]    Report Status PENDING  Incomplete  Blood culture (  routine x 2)     Status: None (Preliminary result)   Collection Time: 12/14/21 10:30 PM   Specimen: BLOOD  Result Value Ref Range Status   Specimen Description   Final    BLOOD LEFT ARM Performed at [facility], [address], [place], [place] [address]    Special Requests   Final    BOTTLES DRAWN AEROBIC AND ANAEROBIC Blood Culture adequate volume Performed at [facility], [address], [place], [place] [address]    Culture   Final    NO GROWTH 1 DAY Performed at [facility], [address] [place], [place] [address]    Report Status PENDING  Incomplete  Resp Panel by RT-PCR (Flu Lavida Patch&B, Covid)     Status: None   Collection Time: 12/14/21 10:30 PM   Specimen: Nasopharyngeal(NP) swabs in vial transport medium  Result Value Ref Range Status   SARS Coronavirus 2 by RT PCR NEGATIVE NEGATIVE Final    Comment: (NOTE) SARS-CoV-2 target nucleic acids are NOT DETECTED.  The SARS-CoV-2 RNA is generally detectable in upper respiratory specimens during the acute phase of infection. The lowest concentration of SARS-CoV-2 viral copies this assay can detect is 138 copies/mL. Aldin Drees negative result does not preclude SARS-Cov-2 infection and should not be used as the sole basis for treatment or other patient management decisions. Gerhart Ruggieri negative result may occur with   improper specimen collection/handling, submission of specimen other than nasopharyngeal swab, presence of viral mutation(s) within the areas targeted by this assay, and inadequate number of viral copies(<138 copies/mL). Pierce Barocio negative result must be combined with clinical observations, patient history, and epidemiological information. The expected result is Negative.  Fact Sheet for Patients:  BloggerCourse.com  Fact Sheet for Healthcare Providers:  SeriousBroker.it  This test is no t yet approved or cleared by the [place] FDA and  has been authorized for detection and/or diagnosis of SARS-CoV-2 by FDA under an Emergency Use Authorization (EUA). This EUA will remain  in effect (meaning this test can be used) for the duration of the COVID-19 declaration under Section 564(b)(1) of the Act, 21 U.S.C.section 360bbb-3(b)(1), unless the authorization is terminated  or revoked sooner.       Influenza Jairo Bellew by PCR NEGATIVE NEGATIVE Final   Influenza B by PCR NEGATIVE NEGATIVE Final    Comment: (NOTE) The Xpert Xpress SARS-CoV-2/FLU/RSV plus assay is intended as an aid in the diagnosis of influenza from Nasopharyngeal swab specimens and should not be used as Keon Benscoter sole basis for treatment. Nasal washings and aspirates are unacceptable for Xpert Xpress SARS-CoV-2/FLU/RSV testing.  Fact Sheet for Patients: BloggerCourse.com  Fact Sheet for Healthcare Providers: SeriousBroker.it  This test is not yet approved or cleared by the [place] FDA and has been authorized for detection and/or diagnosis of SARS-CoV-2 by FDA under an Emergency Use Authorization (EUA). This EUA will remain in effect (meaning this test can be used) for the duration of the COVID-19 declaration under Section 564(b)(1) of the Act, 21 U.S.C. section 360bbb-3(b)(1), unless the authorization is terminated  or revoked.  Performed at [facility], [address] [place], [place] [address]   Surgical PCR screen     Status: None   Collection Time: 12/15/21 12:58 PM   Specimen: Nasal Mucosa; Nasal Swab  Result Value Ref Range Status   MRSA, PCR NEGATIVE NEGATIVE Final   Staphylococcus aureus NEGATIVE NEGATIVE Final    Comment: (NOTE) The Xpert SA Assay (FDA approved for  NASAL specimens in patients 56 years of age and older), is one component of Martena Emanuele comprehensive surveillance program. It is not intended to diagnose infection nor to guide or monitor treatment. Performed at [facility], [address] [place], [place] [address]   Aerobic/Anaerobic Culture w Gram Stain (surgical/deep wound)     Status: None (Preliminary result)   Collection Time: 12/15/21  5:06 PM   Specimen: PATH Other; Tissue  Result Value Ref Range Status   Specimen Description   Final    TISSUE Performed at [facility], [address] [place], [place] [address]    Special Requests   Final    NONE SHOULDER ABSCESS Performed at [facility], [address], [place], [place] [address]    Gram Stain   Final    ABUNDANT WBC PRESENT, PREDOMINANTLY PMN FEW GRAM POSITIVE COCCI    Culture   Final    NO GROWTH < 12 HOURS Performed at [facility], [address], [place], [place] [address]    Report Status PENDING  Incomplete  Aerobic/Anaerobic Culture w Gram Stain (surgical/deep wound)     Status: None (Preliminary result)   Collection Time: 12/15/21  5:06 PM   Specimen: PATH Other; Tissue  Result Value Ref Range Status   Specimen Description   Final    TISSUE Performed at [facility], [address], [place], [place] [address]    Special Requests   Final    NONE HAND ABSCESS Performed at [facility], [address], [place], [place] [address]    Gram Stain   Final    ABUNDANT WBC PRESENT,  PREDOMINANTLY PMN FEW GRAM POSITIVE COCCI RARE GRAM NEGATIVE RODS    Culture   Final    TOO YOUNG TO READ Performed at [facility], [address], [place], [place] [address]    Report Status PENDING  Incomplete         Radiology Studies: DG Thoracic Spine 2 View  Result Date: 12/15/2021 CLINICAL DATA:  Provided history: Upper back pain since March 2023. EXAM: THORACIC SPINE 2 VIEWS COMPARISON:  Chest CT 06/14/2019. FINDINGS: The patient was unable to tolerate lateral view radiographs, and only AP view radiographs could be acquired. This significantly limits evaluation. Levocurvature of the upper thoracic and lower cervical spine. Within described limitations, there is no appreciable thoracic vertebral compression fracture. IMPRESSION: The patient was unable to tolerate lateral view radiographs, significantly limiting evaluation. No appreciable thoracic vertebral compression fracture on the acquired AP view radiographs. Levocurvature of the upper thoracic and lower cervical spine. Electronically Signed   By: Jackey Loge D.O.   On: 12/15/2021 09:37   CT Angio Chest Pulmonary Embolism (PE) W or WO Contrast  Result Date: 12/15/2021 CLINICAL DATA:  Chest pain, shortness of breath with pleuritic chest pain EXAM: CT ANGIOGRAPHY CHEST WITH CONTRAST TECHNIQUE: Multidetector CT imaging of the chest was performed using the standard protocol during bolus administration of intravenous contrast. Multiplanar CT image reconstructions and MIPs were obtained to evaluate the vascular anatomy. Additional CT imaging and reformats of the thoracic spine were performed. RADIATION DOSE REDUCTION: This exam was performed according to the departmental dose-optimization program which includes automated exposure control, adjustment of the mA and/or kV according to patient size and/or use of iterative reconstruction technique. CONTRAST:  OMNIPAQUE IOHEXOL 350 MG/ML SOLN COMPARISON:  CT scan of the thoracic  spine FINDINGS: Cardiovascular: Satisfactory opacification of the pulmonary  arteries to the segmental level. No evidence of pulmonary embolism. Normal heart size. No pericardial effusion. Mediastinum/Nodes: No enlarged mediastinal, hilar, or axillary lymph nodes. Thyroid gland, trachea, and esophagus demonstrate no significant findings. Lungs/Pleura: Mild volume loss with patchy bibasilar dependent airspace opacities. Findings are most suggestive of atelectasis. No focal airspace infiltrate to suggest pneumonia. No pleural effusion, pneumothorax or cavitary pulmonary nodules. Upper Abdomen: No acute abnormality. Incidental note made of replaced lateral segmental branch of the left hepatic artery. Musculoskeletal: No chest wall abnormality. No acute or significant osseous findings. Additional separately reconstructed dedicated CT images of the thoracic spine were also evaluated. No evidence of fracture, malalignment or significant degenerative change. No level specific degenerative changes. No destructive changes, endplate sclerosis or soft tissue abnormality to suggest osteomyelitis/discitis. Review of the MIP images confirms the above findings. IMPRESSION: 1. Mild dependent bibasilar airspace opacities with associated volume loss favored to reflect atelectasis. Small volume aspiration is Tiea Manninen consideration in the appropriate clinical context. 2. Otherwise, no acute cardiopulmonary process. 3. No evidence of fracture, malalignment or destructive process within the thoracic spine. Electronically Signed   By: Malachy Moan M.D.   On: 12/15/2021 10:17   CT T-SPINE NO CHARGE  Result Date: 12/15/2021 CLINICAL DATA:  Chest pain, shortness of breath with pleuritic chest pain EXAM: CT ANGIOGRAPHY CHEST WITH CONTRAST TECHNIQUE: Multidetector CT imaging of the chest was performed using the standard protocol during bolus administration of intravenous contrast. Multiplanar CT image reconstructions and MIPs were obtained to  evaluate the vascular anatomy. Additional CT imaging and reformats of the thoracic spine were performed. RADIATION DOSE REDUCTION: This exam was performed according to the departmental dose-optimization program which includes automated exposure control, adjustment of the mA and/or kV according to patient size and/or use of iterative reconstruction technique. CONTRAST:  OMNIPAQUE IOHEXOL 350 MG/ML SOLN COMPARISON:  CT scan of the thoracic spine FINDINGS: Cardiovascular: Satisfactory opacification of the pulmonary arteries to the segmental level. No evidence of pulmonary embolism. Normal heart size. No pericardial effusion. Mediastinum/Nodes: No enlarged mediastinal, hilar, or axillary lymph nodes. Thyroid gland, trachea, and esophagus demonstrate no significant findings. Lungs/Pleura: Mild volume loss with patchy bibasilar dependent airspace opacities. Findings are most suggestive of atelectasis. No focal airspace infiltrate to suggest pneumonia. No pleural effusion, pneumothorax or cavitary pulmonary nodules. Upper Abdomen: No acute abnormality. Incidental note made of replaced lateral segmental branch of the left hepatic artery. Musculoskeletal: No chest wall abnormality. No acute or significant osseous findings. Additional separately reconstructed dedicated CT images of the thoracic spine were also evaluated. No evidence of fracture, malalignment or significant degenerative change. No level specific degenerative changes. No destructive changes, endplate sclerosis or soft tissue abnormality to suggest osteomyelitis/discitis. Review of the MIP images confirms the above findings. IMPRESSION: 1. Mild dependent bibasilar airspace opacities with associated volume loss favored to reflect atelectasis. Small volume aspiration is Raymel Cull consideration in the appropriate clinical context. 2. Otherwise, no acute cardiopulmonary process. 3. No evidence of fracture, malalignment or destructive process within the thoracic spine.  Electronically Signed   By: Malachy Moan M.D.   On: 12/15/2021 10:17   DG CHEST PORT 1 VIEW  Result Date: 12/15/2021 CLINICAL DATA:  Dyspnea EXAM: PORTABLE CHEST 1 VIEW COMPARISON:  09/29/2021 FINDINGS: Lungs are clear.  No pleural effusion or pneumothorax. The heart is normal in size. IMPRESSION: No evidence of acute cardiopulmonary disease. Electronically Signed   By: Charline Bills M.D.   On: 12/15/2021 00:16   [place] RT UPPER EXTREM LTD SOFT  TISSUE NON VASCULAR  Result Date: 12/15/2021 CLINICAL DATA:  31 year old with right upper extremity abscesses. EXAM: ULTRASOUND RIGHT UPPER EXTREMITY LIMITED TECHNIQUE: Ultrasound examination of the upper extremity soft tissues was performed in the area of clinical concern. COMPARISON:  None Available. FINDINGS: Complex hypoechoic heterogeneous structure in the dorsum of the right hand. This is most compatible with Verlon Carcione complex fluid collection and abscess. The right hand structure measures 4.4 x 2.0 x 3.6 cm. Structure has peripheral blood flow but no significant internal vascularity. There is Athaliah Baumbach smaller heterogeneous complex fluid collection in the anterior right shoulder region. Right shoulder structure measures 2.5 x 1.3 x 2.3 cm. This shoulder collection is superficial with peripheral vascularity. IMPRESSION: Heterogeneous collection along the dorsum of the right hand and smaller collection in the anterior right shoulder. Findings are most compatible with superficial abscess collections. Electronically Signed   By: Richarda Overlie M.D.   On: 12/15/2021 10:51     Scheduled Meds:  acetaminophen  1,000 mg Oral Q8H   lidocaine  1 patch Transdermal Q24H   mupirocin ointment  1 application. Nasal BID   Continuous Infusions:  cefTRIAXone (ROCEPHIN)  IV 1 g (12/16/21 1100)   vancomycin 1,250 mg (12/16/21 0901)     LOS: 2 days    Time spent: over 30 min    Lacretia Nicks, MD [facility]   To contact the attending provider between 7A-7P or the  covering provider during after hours 7P-7A, please log into the web site [email or web address] and access using universal [place] password for that web site. If you do not have the password, please call the hospital operator.  12/16/2021, 7:06 PM

## 2021-12-17 DIAGNOSIS — R109 Unspecified abdominal pain: Secondary | ICD-10-CM

## 2021-12-17 DIAGNOSIS — R7303 Prediabetes: Secondary | ICD-10-CM

## 2021-12-17 DIAGNOSIS — F419 Anxiety disorder, unspecified: Secondary | ICD-10-CM | POA: Insufficient documentation

## 2021-12-17 LAB — URINALYSIS, ROUTINE W REFLEX MICROSCOPIC
Bacteria, UA: NONE SEEN
Bilirubin Urine: NEGATIVE
Glucose, UA: NEGATIVE mg/dL
Hgb urine dipstick: NEGATIVE
Ketones, ur: 5 mg/dL — AB
Leukocytes,Ua: NEGATIVE
Nitrite: NEGATIVE
Protein, ur: 30 mg/dL — AB
Specific Gravity, Urine: 1.035 — ABNORMAL HIGH (ref 1.005–1.030)
pH: 5 (ref 5.0–8.0)

## 2021-12-17 LAB — CBC WITH DIFFERENTIAL/PLATELET
Abs Immature Granulocytes: 0.03 10*3/uL (ref 0.00–0.07)
Basophils Absolute: 0 10*3/uL (ref 0.0–0.1)
Basophils Relative: 0 %
Eosinophils Absolute: 0 10*3/uL (ref 0.0–0.5)
Eosinophils Relative: 0 %
HCT: 39.1 % (ref 39.0–52.0)
Hemoglobin: 13.4 g/dL (ref 13.0–17.0)
Immature Granulocytes: 0 %
Lymphocytes Relative: 14 %
Lymphs Abs: 1.6 10*3/uL (ref 0.7–4.0)
MCH: 28.4 pg (ref 26.0–34.0)
MCHC: 34.3 g/dL (ref 30.0–36.0)
MCV: 82.8 fL (ref 80.0–100.0)
Monocytes Absolute: 0.8 10*3/uL (ref 0.1–1.0)
Monocytes Relative: 7 %
Neutro Abs: 8.9 10*3/uL — ABNORMAL HIGH (ref 1.7–7.7)
Neutrophils Relative %: 79 %
Platelets: 392 10*3/uL (ref 150–400)
RBC: 4.72 MIL/uL (ref 4.22–5.81)
RDW: 13.5 % (ref 11.5–15.5)
WBC: 11.4 10*3/uL — ABNORMAL HIGH (ref 4.0–10.5)
nRBC: 0 % (ref 0.0–0.2)

## 2021-12-17 LAB — COMPREHENSIVE METABOLIC PANEL
ALT: 15 U/L (ref 0–44)
AST: 17 U/L (ref 15–41)
Albumin: 3.3 g/dL — ABNORMAL LOW (ref 3.5–5.0)
Alkaline Phosphatase: 60 U/L (ref 38–126)
Anion gap: 9 (ref 5–15)
BUN: 13 mg/dL (ref 6–20)
CO2: 28 mmol/L (ref 22–32)
Calcium: 9.5 mg/dL (ref 8.9–10.3)
Chloride: 102 mmol/L (ref 98–111)
Creatinine, Ser: 0.96 mg/dL (ref 0.61–1.24)
GFR, Estimated: >60 mL/min (ref >60)
Glucose, Bld: 118 mg/dL — ABNORMAL HIGH (ref 70–99)
Potassium: 4 mmol/L (ref 3.5–5.1)
Sodium: 139 mmol/L (ref 135–145)
Total Bilirubin: 0.6 mg/dL (ref 0.3–1.2)
Total Protein: 8.4 g/dL — ABNORMAL HIGH (ref 6.5–8.1)

## 2021-12-17 LAB — PHOSPHORUS: Phosphorus: 3.3 mg/dL (ref 2.5–4.6)

## 2021-12-17 LAB — SEDIMENTATION RATE: Sed Rate: 75 mm/hr — ABNORMAL HIGH (ref 0–16)

## 2021-12-17 LAB — MAGNESIUM: Magnesium: 2 mg/dL (ref 1.7–2.4)

## 2021-12-17 LAB — C-REACTIVE PROTEIN: CRP: 7.5 mg/dL — ABNORMAL HIGH (ref <1.0)

## 2021-12-17 MED ORDER — BUPRENORPHINE HCL-NALOXONE HCL 8-2 MG SL SUBL
1.0000 | SUBLINGUAL_TABLET | Freq: Two times a day (BID) | SUBLINGUAL | Status: DC
  Administered 2021-12-18 – 2021-12-28 (×21): 1 via SUBLINGUAL
  Filled 2021-12-17 (×15): qty 1
  Filled 2021-12-17: qty 2
  Filled 2021-12-17 (×6): qty 1

## 2021-12-17 MED ORDER — BUPRENORPHINE HCL-NALOXONE HCL 2-0.5 MG SL SUBL
2.0000 | SUBLINGUAL_TABLET | Freq: Once | SUBLINGUAL | Status: AC
  Administered 2021-12-17: 2 via SUBLINGUAL
  Filled 2021-12-17: qty 2

## 2021-12-17 MED ORDER — HYDROMORPHONE HCL 1 MG/ML IJ SOLN
1.0000 mg | INTRAMUSCULAR | Status: DC | PRN
  Administered 2021-12-19 – 2021-12-20 (×3): 1 mg via INTRAVENOUS
  Filled 2021-12-17 (×3): qty 1

## 2021-12-17 MED ORDER — OXYCODONE HCL 5 MG PO TABS
5.0000 mg | ORAL_TABLET | ORAL | Status: DC | PRN
  Administered 2021-12-20 – 2021-12-23 (×3): 5 mg via ORAL
  Filled 2021-12-17 (×4): qty 1

## 2021-12-17 MED ORDER — BUPRENORPHINE HCL-NALOXONE HCL 2-0.5 MG SL SUBL
2.0000 | SUBLINGUAL_TABLET | SUBLINGUAL | Status: AC | PRN
  Administered 2021-12-17: 2 via SUBLINGUAL
  Filled 2021-12-17: qty 2

## 2021-12-17 MED ORDER — POLYETHYLENE GLYCOL 3350 17 G PO PACK
17.0000 g | PACK | Freq: Every day | ORAL | Status: DC
Start: 2021-12-17 — End: 2021-12-25
  Administered 2021-12-17 – 2021-12-18 (×2): 17 g via ORAL
  Filled 2021-12-17 (×7): qty 1

## 2021-12-17 NOTE — Progress Notes (Addendum)
PROGRESS NOTE    Stephen Shepard  BJY:[id number] DOB: 1990-11-19 DOA: 12/14/2021 PCP: Patient, [name] (Inactive)  Chief Complaint  Patient presents with   Abscess    Brief Narrative:  Stephen Shepard is Stephen Shepard 31 y.o. male with medical history significant of seizure not on antiepileptics and history of IV heroin drug use who presents with concerns of worsening abscess.    Assessment & Plan:   Principal Problem:   Abscess of multiple sites Active Problems:   Anxiety   Shortness of breath   IVDU (intravenous drug user)   Back pain   AKI (acute kidney injury) (HCC)   Hepatitis C antibody positive in blood   Chest pain   Prediabetes   Sepsis (HCC)   Assessment and Plan: * Abscess of multiple sites Sepsis related to multiple abscesses, tachycardic, leukocytosis at presentation -> ruled in R hand abscess and R shoulder abscess.  Several other locations appear to have spontaneously drained.  Several on L arm that appear to have spontaneously drained/improved.  US with heterogenous collection along dorsum of R hand and small collection in the anterior R shoulder S/p right shoulder incision and drainage subcutaneous abscess, R wrist and dorsum of the hand drainage of deep abscess, right hand fourth dorsal compartment tenosynovectomy, right hand third dorsal compartment tenosynovectomy, right hand second dorsal compartment tenosynovectomy on 5/18 Recommending daily packing changes to wound on shoulder and dry dressing on incision For R wrist and forearm, recommending keep splint in place and they'll see him back on Monday Follow operative cultures -> hand abscess with strep intermedius, haemophilus parainfluenzae, beta lactamase negative.  Shoulder abscess with few gram positive cocci. Blood cx pending Continue with IV vancomycin and ceftriaxone for now.  IV azithromycin was on at admission with concern for cat sratch, but suspect more likely related to IVDU, will hold azithro for now and  follow cx.   Will discuss with ID in AM Suspect related to most likely related to IVDU - close attention to blood cultures given multiple abscesses.   Anxiety Odd affect Extremely anxious, difficult to pin down about certain things Telling me he wants to leave today (discussed he needs continued treatment at the moment) ? Component of withdrawal - see below Will follow for now  Shortness of breath CT PE protocol with mild dependent bibasilar airspace opacities with associated volume loss favored to reflect atelectasis, small volume aspiration is Argil Mahl consideration follow  Back pain C/o thoracic back pain Plain films unremarkable CT t spine without evidence of fracture, malalignment, or destructive process within thoracic spine MRI T/L spine without evidence of infection No LE weakness or saddle anesthesia   IVDU (intravenous drug user) Reports last heroin use Brittnei Jagiello month ago -negative HIV -follow acute hepatitis panel I think he's having some withdrawal symptoms.  He's interested in suboxone today.  Last dose dilaudid was 5/18, last dose of oxycodone was 5/18 as well.  Should be ok to start induction today.  Will try to hold opiates until his dose of suboxone has been titrated up (but if needs meds for pain, can halt induction and transition to pain meds only).   AKI (acute kidney injury) (HCC) Resolved, follow  Hepatitis C antibody positive in blood Follow PCR  Chest pain negative troponin CT done yesterday without PE Will ctm   Prediabetes noted      DVT prophylaxis: lovenox Code Status: full Family Communication: called sig other, no answer Disposition:   Status is: Inpatient Remains inpatient appropriate because:  need for IV abx, hand surgery clearnace   Consultants:  ortho  Procedures:  As above  Antimicrobials:  Anti-infectives (From admission, onward)    Start     Dose/Rate Route Frequency Ordered Stop   12/16/21 1000  cefTRIAXone (ROCEPHIN) 1 g in  sodium chloride 0.9 % 100 mL IVPB        1 g 200 mL/hr over 30 Minutes Intravenous Every 24 hours 12/16/21 0817     12/15/21 2000  vancomycin (VANCOREADY) IVPB 1750 mg/350 mL  Status:  Discontinued        1,750 mg 175 mL/hr over 120 Minutes Intravenous Every 24 hours 12/14/21 2351 12/15/21 0953   12/15/21 1215  ceFAZolin (ANCEF) IVPB 2g/100 mL premix        2 g 200 mL/hr over 30 Minutes Intravenous On call to O.R. 12/15/21 1206 12/15/21 1635   12/15/21 1000  vancomycin (VANCOREADY) IVPB 1250 mg/250 mL        1,250 mg 166.7 mL/hr over 90 Minutes Intravenous Every 12 hours 12/15/21 0954     12/14/21 2345  azithromycin (ZITHROMAX) 500 mg in sodium chloride 0.9 % 250 mL IVPB  Status:  Discontinued        500 mg 250 mL/hr over 60 Minutes Intravenous Every 24 hours 12/14/21 2337 12/16/21 1901   12/14/21 1930  vancomycin (VANCOCIN) IVPB 1000 mg/200 mL premix        1,000 mg 200 mL/hr over 60 Minutes Intravenous  Once 12/14/21 1920 12/14/21 2352       Subjective: Asking for suboxone today  Objective: Vitals:   12/16/21 1937 12/17/21 0434 12/17/21 1358 12/17/21 1508  BP: 131/84 136/80  135/87  Pulse: (!) 51 (!) 58 (!) 53 (!) 50  Resp: Temp: 99.8 F (37.7 C) 99.8 F (37.7 C)  98.9 F (37.2 C)  TempSrc: Oral Oral  Oral  SpO2: 100% 98% 99% 99%  Weight:      Height:        Intake/Output Summary (Last 24 hours) at 12/17/2021 1812 Last data filed at 12/17/2021 1700 Gross per 24 hour  Intake 1897.57 ml  Output 500 ml  Net 1397.57 ml   Filed Weights   12/14/21 1622 12/15/21 1207  Weight: 74.8 kg 64.3 kg    Examination:  General: No acute distress. Cardiovascular: RRR Lungs: unlabored Abdomen: mild TTP Neurological: odd affect, Alert. Moves all extremities 4 . Cranial nerves II through XII grossly intact. Extremities: R splint, dressing to R shoulder intact   Data Reviewed: I have personally reviewed following labs and imaging studies  CBC: Recent Labs  Lab  12/14/21 1640 12/15/21 0424 12/17/21 0548  WBC 15.7* 15.0* 11.4*  NEUTROABS 12.3*  --  8.9*  HGB 13.8 12.7* 13.4  HCT 42.5 38.1* 39.1  MCV 87.1 85.0 82.8  PLT 435* 369 392    Basic Metabolic Panel: Recent Labs  Lab 12/14/21 1640 12/15/21 0424 12/16/21 0731 12/17/21 0548  NA 139 139  --  139  K 3.8 4.1  --  4.0  CL 99 103  --  102  CO2 30 29  --  28  GLUCOSE 111* 114*  --  118*  BUN 21* 15  --  13  CREATININE 1.61* 1.00 1.08 0.96  CALCIUM 10.1 9.2  --  9.5  MG  --   --   --  2.0  PHOS  --   --   --  3.3    GFR: Estimated Creatinine Clearance:  100.5 mL/min (by C-G formula based on SCr of 0.96 mg/dL).  Liver Function Tests: Recent Labs  Lab 12/17/21 0548  AST 17  ALT 15  ALKPHOS 60  BILITOT 0.6  PROT 8.4*  ALBUMIN 3.3*    CBG: No results for input(s): GLUCAP in the last 168 hours.   Recent Results (from the past 240 hour(s))  Blood culture (routine x 2)     Status: None (Preliminary result)   Collection Time: 12/14/21  5:25 PM   Specimen: BLOOD  Result Value Ref Range Status   Specimen Description   Final    BLOOD LEFT ANTECUBITAL Performed at [facility], [address] [place], [place] [address]    Special Requests   Final    BOTTLES DRAWN AEROBIC AND ANAEROBIC Blood Culture adequate volume Performed at [facility], [address], [place], [place] [address]    Culture   Final    NO GROWTH 3 DAYS Performed at [facility], [address], [place], [place] [address]    Report Status PENDING  Incomplete  Blood culture (routine x 2)     Status: None (Preliminary result)   Collection Time: 12/14/21 10:30 PM   Specimen: BLOOD  Result Value Ref Range Status   Specimen Description   Final    BLOOD LEFT ARM Performed at [facility], [address], [place], [place] [address]    Special Requests   Final    BOTTLES DRAWN AEROBIC AND ANAEROBIC Blood Culture adequate volume Performed at  [facility], [address] [place], [place] [address]    Culture   Final    NO GROWTH 2 DAYS Performed at [facility], [address] [place], [place] [address]    Report Status PENDING  Incomplete  Resp Panel by RT-PCR (Flu Javarius Tsosie&B, Covid)     Status: None   Collection Time: 12/14/21 10:30 PM   Specimen: Nasopharyngeal(NP) swabs in vial transport medium  Result Value Ref Range Status   SARS Coronavirus 2 by RT PCR NEGATIVE NEGATIVE Final    Comment: (NOTE) SARS-CoV-2 target nucleic acids are NOT DETECTED.  The SARS-CoV-2 RNA is generally detectable in upper respiratory specimens during the acute phase of infection. The lowest concentration of SARS-CoV-2 viral copies this assay can detect is 138 copies/mL. Levetta Bognar negative result does not preclude SARS-Cov-2 infection and should not be used as the sole basis for treatment or other patient management decisions. Dorthula Bier negative result may occur with  improper specimen collection/handling, submission of specimen other than nasopharyngeal swab, presence of viral mutation(s) within the areas targeted by this assay, and inadequate number of viral copies(<138 copies/mL). Denitra Donaghey negative result must be combined with clinical observations, patient history, and epidemiological information. The expected result is Negative.  Fact Sheet for Patients:  BloggerCourse.com  Fact Sheet for Healthcare Providers:  SeriousBroker.it  This test is no t yet approved or cleared by the [place] FDA and  has been authorized for detection and/or diagnosis of SARS-CoV-2 by FDA under an Emergency Use Authorization (EUA). This EUA will remain  in effect (meaning this test can be used) for the duration of the COVID-19 declaration under Section 564(b)(1) of the Act, 21 U.S.C.section 360bbb-3(b)(1), unless the authorization is terminated  or revoked sooner.       Influenza Brooklinn Longbottom by PCR NEGATIVE  NEGATIVE Final   Influenza B by PCR NEGATIVE NEGATIVE Final    Comment: (NOTE) The Xpert Xpress  SARS-CoV-2/FLU/RSV plus assay is intended as an aid in the diagnosis of influenza from Nasopharyngeal swab specimens and should not be used as Hyder Deman sole basis for treatment. Nasal washings and aspirates are unacceptable for Xpert Xpress SARS-CoV-2/FLU/RSV testing.  Fact Sheet for Patients: BloggerCourse.com  Fact Sheet for Healthcare Providers: SeriousBroker.it  This test is not yet approved or cleared by the [place] FDA and has been authorized for detection and/or diagnosis of SARS-CoV-2 by FDA under an Emergency Use Authorization (EUA). This EUA will remain in effect (meaning this test can be used) for the duration of the COVID-19 declaration under Section 564(b)(1) of the Act, 21 U.S.C. section 360bbb-3(b)(1), unless the authorization is terminated or revoked.  Performed at [facility], [address] [place], [place] [address]   Surgical PCR screen     Status: None   Collection Time: 12/15/21 12:58 PM   Specimen: Nasal Mucosa; Nasal Swab  Result Value Ref Range Status   MRSA, PCR NEGATIVE NEGATIVE Final   Staphylococcus aureus NEGATIVE NEGATIVE Final    Comment: (NOTE) The Xpert SA Assay (FDA approved for NASAL specimens in patients 1 years of age and older), is one component of Aly Hauser comprehensive surveillance program. It is not intended to diagnose infection nor to guide or monitor treatment. Performed at [facility], [address] [place], [place] [address]   Aerobic/Anaerobic Culture w Gram Stain (surgical/deep wound)     Status: None (Preliminary result)   Collection Time: 12/15/21  5:06 PM   Specimen: PATH Other; Tissue  Result Value Ref Range Status   Specimen Description   Final    TISSUE Performed at [facility], [address] [place], [place]  [address]    Special Requests   Final    NONE SHOULDER ABSCESS Performed at [facility], [address] [place], [place] [address]    Gram Stain   Final    ABUNDANT WBC PRESENT, PREDOMINANTLY PMN FEW GRAM POSITIVE COCCI Performed at [facility], [address], [place], [place] [address]    Culture   Final    CULTURE REINCUBATED FOR BETTER GROWTH NO ANAEROBES ISOLATED; CULTURE IN PROGRESS FOR 5 DAYS    Report Status PENDING  Incomplete  Aerobic/Anaerobic Culture w Gram Stain (surgical/deep wound)     Status: None (Preliminary result)   Collection Time: 12/15/21  5:06 PM   Specimen: PATH Other; Tissue  Result Value Ref Range Status   Specimen Description   Final    TISSUE Performed at [facility], [address] [place], [place] [address]    Special Requests   Final    NONE HAND ABSCESS Performed at [facility], [address] [place], [place] [address]    Gram Stain   Final    ABUNDANT WBC PRESENT, PREDOMINANTLY PMN FEW GRAM POSITIVE COCCI RARE GRAM NEGATIVE RODS    Culture   Final    ABUNDANT STREPTOCOCCUS INTERMEDIUS SUSCEPTIBILITIES TO FOLLOW FEW HAEMOPHILUS PARAINFLUENZAE BETA LACTAMASE NEGATIVE HOLDING FOR POSSIBLE ANAEROBE Performed at [facility], [address], [place], [place] [address]    Report Status PENDING  Incomplete         Radiology Studies: MR THORACIC SPINE W WO CONTRAST  Result Date: 12/17/2021 CLINICAL DATA:  Low back pain with suspected infection. IV drug abuse. EXAM: MRI THORACIC AND LUMBAR SPINE WITHOUT AND WITH CONTRAST TECHNIQUE: Multiplanar and multiecho pulse sequences of the thoracic and lumbar  spine were obtained without and with intravenous contrast. CONTRAST:  71mL GADAVIST GADOBUTROL 1 MMOL/ML IV SOLN COMPARISON:  None Available. FINDINGS: MRI THORACIC SPINE FINDINGS Alignment:  Physiologic. Vertebrae: No fracture, evidence of discitis, or bone lesion. Cord:   Normal signal and morphology. Paraspinal and other soft tissues: Negative. Disc levels: Small left subarticular disc protrusion at T6-7 without spinal canal stenosis. The other thoracic disc levels are normal. Postcontrast images of the thoracic spine are motion degraded but within that limitation there is no enhancing abnormality. MRI LUMBAR SPINE FINDINGS Segmentation:  Standard. Alignment:  Physiologic. Vertebrae:  No fracture, evidence of discitis, or bone lesion. Conus medullaris: Extends to the L1 level and appears normal. Paraspinal and other soft tissues: Normal Disc levels: At L4-5, there is Aland Chestnutt small left subarticular disc protrusion mildly narrowing the left lateral recess. No central spinal canal or neural foraminal stenosis. The other lumbar disc levels are normal. IMPRESSION: 1. No evidence of discitis-osteomyelitis or epidural abscess. 2. Small left subarticular disc protrusion at T6-7 without spinal canal stenosis. 3. Small left subarticular disc protrusion at L4-5 mildly narrowing the left lateral recess. Otherwise normal thoracic and lumbar spine. Electronically Signed   By: Deatra Robinson M.D.   On: 12/17/2021 00:39   MR Lumbar Spine W Wo Contrast  Result Date: 12/17/2021 CLINICAL DATA:  Low back pain with suspected infection. IV drug abuse. EXAM: MRI THORACIC AND LUMBAR SPINE WITHOUT AND WITH CONTRAST TECHNIQUE: Multiplanar and multiecho pulse sequences of the thoracic and lumbar spine were obtained without and with intravenous contrast. CONTRAST:  41mL GADAVIST GADOBUTROL 1 MMOL/ML IV SOLN COMPARISON:  None Available. FINDINGS: MRI THORACIC SPINE FINDINGS Alignment:  Physiologic. Vertebrae: No fracture, evidence of discitis, or bone lesion. Cord:  Normal signal and morphology. Paraspinal and other soft tissues: Negative. Disc levels: Small left subarticular disc protrusion at T6-7 without spinal canal stenosis. The other thoracic disc levels are normal. Postcontrast images of the thoracic spine  are motion degraded but within that limitation there is no enhancing abnormality. MRI LUMBAR SPINE FINDINGS Segmentation:  Standard. Alignment:  Physiologic. Vertebrae:  No fracture, evidence of discitis, or bone lesion. Conus medullaris: Extends to the L1 level and appears normal. Paraspinal and other soft tissues: Normal Disc levels: At L4-5, there is Maicol Bowland small left subarticular disc protrusion mildly narrowing the left lateral recess. No central spinal canal or neural foraminal stenosis. The other lumbar disc levels are normal. IMPRESSION: 1. No evidence of discitis-osteomyelitis or epidural abscess. 2. Small left subarticular disc protrusion at T6-7 without spinal canal stenosis. 3. Small left subarticular disc protrusion at L4-5 mildly narrowing the left lateral recess. Otherwise normal thoracic and lumbar spine. Electronically Signed   By: Deatra Robinson M.D.   On: 12/17/2021 00:39     Scheduled Meds:  acetaminophen  1,000 mg Oral Q8H   buprenorphine-naloxone  2 tablet Sublingual Once   [START ON 12/18/2021] buprenorphine-naloxone  1 tablet Sublingual BID   lidocaine  1 patch Transdermal Q24H   mupirocin ointment  1 application. Nasal BID   polyethylene glycol  17 g Oral Daily   Continuous Infusions:  cefTRIAXone (ROCEPHIN)  IV 1 g (12/17/21 0936)   vancomycin 1,250 mg (12/17/21 1025)     LOS: 3 days    Time spent: over 30 min    Lacretia Nicks, MD [facility]   To contact the attending provider between 7A-7P or the covering provider during after hours 7P-7A, please log into the web site [email or web address] and access using universal  [facility] password for that web site. If you do not have the password, please call the hospital operator.  12/17/2021, 6:12 PM

## 2021-12-17 NOTE — Progress Notes (Signed)
Order given to give a dose of suboxone even with COWS of 4. Order carried out by this Probation officer.

## 2021-12-17 NOTE — Assessment & Plan Note (Addendum)
resolved 

## 2021-12-17 NOTE — Assessment & Plan Note (Addendum)
Paranoid, anxious at times Wants to leave at times and questioning overall plan of care - I was able to encourage him and educate him to stay  ? Component of withdrawal - see below Repeat utox  Will follow for now

## 2021-12-17 NOTE — Assessment & Plan Note (Signed)
noted 

## 2021-12-18 DIAGNOSIS — Z720 Tobacco use: Secondary | ICD-10-CM

## 2021-12-18 LAB — BLOOD CULTURE ID PANEL (REFLEXED) - BCID2

## 2021-12-18 LAB — COMPREHENSIVE METABOLIC PANEL
ALT: 26 U/L (ref 0–44)
AST: 28 U/L (ref 15–41)
Albumin: 3.2 g/dL — ABNORMAL LOW (ref 3.5–5.0)
Alkaline Phosphatase: 78 U/L (ref 38–126)
Anion gap: 9 (ref 5–15)
BUN: 20 mg/dL (ref 6–20)
CO2: 27 mmol/L (ref 22–32)
Calcium: 9 mg/dL (ref 8.9–10.3)
Chloride: 104 mmol/L (ref 98–111)
Creatinine, Ser: 0.79 mg/dL (ref 0.61–1.24)
GFR, Estimated: >60 mL/min (ref >60)
Glucose, Bld: 123 mg/dL — ABNORMAL HIGH (ref 70–99)
Potassium: 3.8 mmol/L (ref 3.5–5.1)
Sodium: 140 mmol/L (ref 135–145)
Total Bilirubin: 0.9 mg/dL (ref 0.3–1.2)
Total Protein: 7 g/dL (ref 6.5–8.1)

## 2021-12-18 LAB — MAGNESIUM: Magnesium: 2.1 mg/dL (ref 1.7–2.4)

## 2021-12-18 LAB — AEROBIC/ANAEROBIC CULTURE W GRAM STAIN (SURGICAL/DEEP WOUND)

## 2021-12-18 LAB — URINE CULTURE: Culture: NO GROWTH

## 2021-12-18 LAB — HCV RNA QUANT
HCV Quantitative Log: 6.09 log10 IU/mL (ref >1.70)
HCV Quantitative: 1230000 IU/mL (ref >50)

## 2021-12-18 LAB — PHOSPHORUS: Phosphorus: 2.9 mg/dL (ref 2.5–4.6)

## 2021-12-18 MED ORDER — NICOTINE 21 MG/24HR TD PT24
21.0000 mg | MEDICATED_PATCH | Freq: Every day | TRANSDERMAL | Status: DC
  Administered 2021-12-18 – 2021-12-28 (×10): 21 mg via TRANSDERMAL
  Filled 2021-12-18 (×11): qty 1

## 2021-12-18 MED ORDER — METRONIDAZOLE 500 MG PO TABS
500.0000 mg | ORAL_TABLET | Freq: Three times a day (TID) | ORAL | Status: DC
  Administered 2021-12-18 – 2021-12-19 (×2): 500 mg via ORAL
  Filled 2021-12-18 (×2): qty 1

## 2021-12-18 MED ORDER — NICOTINE POLACRILEX 2 MG MT GUM: 2.0000 mg | CHEWING_GUM | OROMUCOSAL | Status: DC | PRN

## 2021-12-18 MED ORDER — SODIUM CHLORIDE 0.9 % IV SOLN
100.0000 mg | Freq: Every day | INTRAVENOUS | Status: DC
  Administered 2021-12-18 – 2021-12-27 (×10): 100 mg via INTRAVENOUS
  Filled 2021-12-18 (×5): qty 5
  Filled 2021-12-18: qty 10
  Filled 2021-12-18 (×6): qty 5

## 2021-12-18 MED ORDER — ORAL CARE MOUTH RINSE
15.0000 mL | Freq: Two times a day (BID) | OROMUCOSAL | Status: DC
  Administered 2021-12-19 – 2021-12-28 (×15): 15 mL via OROMUCOSAL

## 2021-12-18 NOTE — Progress Notes (Signed)
PROGRESS NOTE    Stephen Shepard  [id number] DOB: [birth year] DOA: 12/14/2021 PCP: Patient, [name] (Inactive)  Chief Complaint  Patient presents with   Abscess    Brief Narrative:  Stephen Shepard is Stephen Shepard 31 y.o. male with medical history significant of seizure not on antiepileptics and history of IV heroin drug use who presents with concerns of worsening abscess.    Assessment & Plan:   Principal Problem:   Abscess of multiple sites Active Problems:   Anxiety   Shortness of breath   IVDU (intravenous drug user)   Back pain   AKI (acute kidney injury) ([place])   Abdominal discomfort   Hepatitis C antibody positive in blood   Chest pain   Tobacco abuse   Prediabetes   Sepsis (HCC)   Assessment and Plan: * Abscess of multiple sites Sepsis related to multiple abscesses, tachycardic, leukocytosis at presentation -> ruled in R hand abscess and R shoulder abscess.  Several other locations appear to have spontaneously drained.  Several on L arm that appear to have spontaneously drained/improved.  [place] with heterogenous collection along dorsum of R hand and small collection in the anterior R shoulder S/p right shoulder incision and drainage subcutaneous abscess, R wrist and dorsum of the hand drainage of deep abscess, right hand fourth dorsal compartment tenosynovectomy, right hand third dorsal compartment tenosynovectomy, right hand second dorsal compartment tenosynovectomy on 5/18 Recommending daily packing changes to wound on shoulder and dry dressing on incision For R wrist and forearm, recommending keep splint in place and they'll see him back on Monday Follow operative cultures -> hand abscess with strep intermedius, haemophilus parainfluenzae, beta lactamase negative.  Shoulder abscess with strep intermedius. Blood cx pending Continue with IV vancomycin and ceftriaxone for now.  IV azithromycin was on at admission with concern for cat sratch, but suspect more likely related to IVDU,  will hold azithro for now and follow cx.   Will discuss with ID in AM Suspect related to most likely related to IVDU - close attention to blood cultures given multiple abscesses.   Anxiety Odd affect at times - Stephen Shepard little better today Extremely anxious, difficult to pin down about certain things ? Component of withdrawal - see below Will follow for now  Shortness of breath CT PE protocol with mild dependent bibasilar airspace opacities with associated volume loss favored to reflect atelectasis, small volume aspiration is Stephen Shepard consideration follow  Back pain C/o thoracic back pain Plain films unremarkable CT t spine without evidence of fracture, malalignment, or destructive process within thoracic spine MRI T/L spine without evidence of infection No LE weakness or saddle anesthesia Improved with lidocaine patch   IVDU (intravenous drug user) Reports last heroin use Stephen Shepard month ago -negative HIV -follow acute hepatitis panel Started on suboxone 5/20.  Will need to plan for discharge, taper vs follow outpatient with prescriber.    Abdominal discomfort resolved  AKI (acute kidney injury) ([address] [facility]) Resolved, follow  Hepatitis C antibody positive in blood Follow PCR  Tobacco abuse Nicotine patch  Chest pain negative troponin CT done yesterday without PE Will ctm   Prediabetes noted      DVT prophylaxis: lovenox Code Status: full Family Communication: called sig other, no answer Disposition:   Status is: Inpatient Remains inpatient appropriate because: need for IV abx, hand surgery clearnace   Consultants:  ortho  Procedures:  As above  Antimicrobials:  Anti-infectives (From admission, onward)    Start     Dose/Rate Route Frequency Ordered  Stop   12/16/21 1000  cefTRIAXone (ROCEPHIN) 1 g in sodium chloride 0.9 % 100 mL IVPB        1 g 200 mL/hr over 30 Minutes Intravenous Every 24 hours 12/16/21 0817     12/15/21 2000  vancomycin (VANCOREADY) IVPB 1750 mg/350  mL  Status:  Discontinued        1,750 mg 175 mL/hr over 120 Minutes Intravenous Every 24 hours 12/14/21 2351 12/15/21 0953   12/15/21 1215  ceFAZolin (ANCEF) IVPB 2g/100 mL premix        2 g 200 mL/hr over 30 Minutes Intravenous On call to O.R. 12/15/21 1206 12/15/21 1635   12/15/21 1000  vancomycin (VANCOREADY) IVPB 1250 mg/250 mL  Status:  Discontinued        1,250 mg 166.7 mL/hr over 90 Minutes Intravenous Every 12 hours 12/15/21 0954 12/18/21 1221   12/14/21 2345  azithromycin (ZITHROMAX) 500 mg in sodium chloride 0.9 % 250 mL IVPB  Status:  Discontinued        500 mg 250 mL/hr over 60 Minutes Intravenous Every 24 hours 12/14/21 2337 12/16/21 1901   12/14/21 1930  vancomycin (VANCOCIN) IVPB 1000 mg/200 mL premix        1,000 mg 200 mL/hr over 60 Minutes Intravenous  Once 12/14/21 1920 12/14/21 2352       Subjective: No new complaints Asking to smoke   Objective: Vitals:   12/17/21 1358 12/17/21 1508 12/17/21 2033 12/18/21 0444  BP:  135/87 (!) 137/91 126/86  Pulse: (!) 53 (!) 50 (!) 49 (!) 48  Resp:  17 14 14   Temp:  98.9 F (37.2 C) 99.6 F (37.6 C) 97.7 F (36.5 C)  TempSrc:  Oral Oral Oral  SpO2: 99% 99% 100% 98%  Weight:      Height:        Intake/Output Summary (Last 24 hours) at 12/18/2021 1316 Last data filed at 12/18/2021 0900 Gross per 24 hour  Intake 460 ml  Output --  Net 460 ml   Filed Weights   12/14/21 1622 12/15/21 1207  Weight: 74.8 kg 64.3 kg    Examination:  General: No acute distress. Cardiovascular: RRR Lungs: unlabored Abdomen: Soft, nontender, nondistended  Neurological: Alert and oriented 3.  More awake, more normal affect today.  Moves all extremities 4 with equal strength. Cranial nerves II through XII grossly intact. Extremities: R arm splint, dressing to shoulder    Data Reviewed: I have personally reviewed following labs and imaging studies  CBC: Recent Labs  Lab 12/14/21 1640 12/15/21 0424 12/17/21 0548  WBC 15.7*  15.0* 11.4*  NEUTROABS 12.3*  --  8.9*  HGB 13.8 12.7* 13.4  HCT 42.5 38.1* 39.1  MCV 87.1 85.0 82.8  PLT 435* 369 0000000    Basic Metabolic Panel: Recent Labs  Lab 12/14/21 1640 12/15/21 0424 12/16/21 0731 12/17/21 0548 12/18/21 0613  NA 139 139  --  139 140  K 3.8 4.1  --  4.0 3.8  CL 99 103  --  102 104  CO2 30 29  --  28 27  GLUCOSE 111* 114*  --  118* 123*  BUN 21* 15  --  13 20  CREATININE 1.61* 1.00 1.08 0.96 0.79  CALCIUM 10.1 9.2  --  9.5 9.0  MG  --   --   --  2.0 2.1  PHOS  --   --   --  3.3 2.9    GFR: Estimated Creatinine Clearance: 120.6 mL/min (by C-G  formula based on SCr of 0.79 mg/dL).  Liver Function Tests: Recent Labs  Lab 12/17/21 0548 12/18/21 0613  AST 17 28  ALT 15 26  ALKPHOS 60 78  BILITOT 0.6 0.9  PROT 8.4* 7.0  ALBUMIN 3.3* 3.2*    CBG: No results for input(s): GLUCAP in the last 168 hours.   Recent Results (from the past 240 hour(s))  Blood culture (routine x 2)     Status: None (Preliminary result)   Collection Time: 12/14/21  5:25 PM   Specimen: BLOOD  Result Value Ref Range Status   Specimen Description   Final    BLOOD LEFT ANTECUBITAL Performed at [facility] [address] [place], [place] [address]    Special Requests   Final    BOTTLES DRAWN AEROBIC AND ANAEROBIC Blood Culture adequate volume Performed at [facility] [address], [place], [place] [address]    Culture   Final    NO GROWTH 4 DAYS Performed at [facility], [place] [address] [place], [place] [address]    Report Status PENDING  Incomplete  Blood culture (routine x 2)     Status: None (Preliminary result)   Collection Time: 12/14/21 10:30 PM   Specimen: BLOOD  Result Value Ref Range Status   Specimen Description   Final    BLOOD LEFT ARM Performed at [facility] [address] [place], [place] [address]    Special Requests   Final    BOTTLES DRAWN AEROBIC AND ANAEROBIC Blood  Culture adequate volume Performed at [facility] [address] [place], [place] [address]    Culture   Final    NO GROWTH 3 DAYS Performed at [facility], [place] [address] [place],  [address]    Report Status PENDING  Incomplete  Resp Panel by RT-PCR (Flu Ivey Nembhard&B, Covid)     Status: None   Collection Time: 12/14/21 10:30 PM   Specimen: Nasopharyngeal(NP) swabs in vial transport medium  Result Value Ref Range Status   SARS Coronavirus 2 by RT PCR NEGATIVE NEGATIVE Final    Comment: (NOTE) SARS-CoV-2 target nucleic acids are NOT DETECTED.  The SARS-CoV-2 RNA is generally detectable in upper respiratory specimens during the acute phase of infection. The lowest concentration of SARS-CoV-2 viral copies this assay can detect is 138 copies/mL. Stephen Shepard negative result does not preclude SARS-Cov-2 infection and should not be used as the sole basis for treatment or other patient management decisions. Stephen Shepard negative result may occur with  improper specimen collection/handling, submission of specimen other than nasopharyngeal swab, presence of viral mutation(s) within the areas targeted by this assay, and inadequate number of viral copies(<138 copies/mL). Stephen Shepard negative result must be combined with clinical observations, patient history, and epidemiological information. The expected result is Negative.  Fact Sheet for Patients:  Stephen Shepard  Fact Sheet for Healthcare Providers:  IncredibleEmployment.be  This test is no t yet approved or cleared by the [place] FDA and  has been authorized for detection and/or diagnosis of SARS-CoV-2 by FDA under an Emergency Use Authorization (EUA). This EUA will remain  in effect (meaning this test can be used) for the duration of the COVID-19 declaration under Section 564(b)(1) of the Act, 21 U.S.C.section 360bbb-3(b)(1), unless the authorization is terminated  or revoked sooner.        Influenza Stephen Shepard by PCR NEGATIVE NEGATIVE Final   Influenza B by PCR NEGATIVE NEGATIVE Final    Comment: (  NOTE) The Xpert Xpress SARS-CoV-2/FLU/RSV plus assay is intended as an aid in the diagnosis of influenza from Nasopharyngeal swab specimens and should not be used as Stephen Shepard sole basis for treatment. Nasal washings and aspirates are unacceptable for Xpert Xpress SARS-CoV-2/FLU/RSV testing.  Fact Sheet for Patients: Stephen Shepard  Fact Sheet for Healthcare Providers: IncredibleEmployment.be  This test is not yet approved or cleared by the [place] FDA and has been authorized for detection and/or diagnosis of SARS-CoV-2 by FDA under an Emergency Use Authorization (EUA). This EUA will remain in effect (meaning this test can be used) for the duration of the COVID-19 declaration under Section 564(b)(1) of the Act, 21 U.S.C. section 360bbb-3(b)(1), unless the authorization is terminated or revoked.  Performed at [facility], [place] [address] [place], [place] [address]   Surgical PCR screen     Status: None   Collection Time: 12/15/21 12:58 PM   Specimen: Nasal Mucosa; Nasal Swab  Result Value Ref Range Status   MRSA, PCR NEGATIVE NEGATIVE Final   Staphylococcus aureus NEGATIVE NEGATIVE Final    Comment: (NOTE) The Xpert SA Assay (FDA approved for NASAL specimens in patients 28 years of age and older), is one component of Stephen Shepard comprehensive surveillance program. It is not intended to diagnose infection nor to guide or monitor treatment. Performed at [facility], [place] [address] [place], [place] [address]   Aerobic/Anaerobic Culture w Gram Stain (surgical/deep wound)     Status: None (Preliminary result)   Collection Time: 12/15/21  5:06 PM   Specimen: PATH Other; Tissue  Result Value Ref Range Status   Specimen Description   Final    TISSUE Performed at [facility] [address] [place], [place] [address]    Special Requests   Final    NONE SHOULDER ABSCESS Performed at [facility], [place] [address] [place], [place] [address]    Gram Stain   Final    ABUNDANT WBC PRESENT, PREDOMINANTLY PMN FEW GRAM POSITIVE COCCI Performed at [facility], [place] [address], [place], [place] [address]    Culture   Final    ABUNDANT STREPTOCOCCUS INTERMEDIUS NO ANAEROBES ISOLATED; CULTURE IN PROGRESS FOR 5 DAYS    Report Status PENDING  Incomplete  Aerobic/Anaerobic Culture w Gram Stain (surgical/deep wound)     Status: None (Preliminary result)   Collection Time: 12/15/21  5:06 PM   Specimen: PATH Other; Tissue  Result Value Ref Range Status   Specimen Description   Final    TISSUE Performed at [facility], [place] [address], [place], [place] [address]    Special Requests   Final    NONE HAND ABSCESS Performed at [facility], [place] [address], [place], [place] [address]    Gram Stain   Final    ABUNDANT WBC PRESENT, PREDOMINANTLY PMN FEW GRAM POSITIVE COCCI RARE GRAM NEGATIVE RODS    Culture   Final    ABUNDANT STREPTOCOCCUS INTERMEDIUS SUSCEPTIBILITIES TO FOLLOW FEW HAEMOPHILUS PARAINFLUENZAE BETA LACTAMASE NEGATIVE HOLDING FOR POSSIBLE ANAEROBE Performed at [facility], [place] [address] [place], [place] [address]    Report Status PENDING  Incomplete  Urine Culture     Status: None   Collection Time: 12/17/21  8:07 AM   Specimen: Urine, Clean Catch  Result Value Ref Range Status   Specimen Description   Final    URINE, CLEAN CATCH Performed at [facility], [place]  [address] [place], [place] [address]    Special Requests   Final    NONE Performed at [facility], [place] [address] [place], [place] [address]    Culture   Final    NO GROWTH Performed at [facility], [place] [address], [place], [place] [address]    Report Status 12/18/2021  FINAL  Final         Radiology Studies: MR THORACIC SPINE W WO CONTRAST  Result Date: 12/17/2021 CLINICAL DATA:  Low back pain with suspected infection. IV drug abuse. EXAM: MRI THORACIC AND LUMBAR SPINE WITHOUT AND WITH CONTRAST TECHNIQUE: Multiplanar and multiecho pulse sequences of the thoracic and lumbar spine were obtained without and with intravenous contrast. CONTRAST:  80mL GADAVIST GADOBUTROL 1 MMOL/ML IV SOLN COMPARISON:  None Available. FINDINGS: MRI THORACIC SPINE FINDINGS Alignment:  Physiologic. Vertebrae: No fracture, evidence of discitis, or bone lesion. Cord:  Normal signal and morphology. Paraspinal and other soft tissues: Negative. Disc levels: Small left subarticular disc protrusion at T6-7 without spinal canal stenosis. The other thoracic disc levels are normal. Postcontrast images of the thoracic spine are motion degraded but within that limitation there is no enhancing abnormality. MRI LUMBAR SPINE FINDINGS Segmentation:  Standard. Alignment:  Physiologic. Vertebrae:  No fracture, evidence of discitis, or bone lesion. Conus medullaris: Extends to the L1 level and appears normal. Paraspinal and other soft tissues: Normal Disc levels: At L4-5, there is Kerrion Kemppainen small left subarticular disc protrusion mildly narrowing the left lateral recess. No central spinal canal or neural foraminal stenosis. The other lumbar disc levels are normal. IMPRESSION: 1. No evidence of discitis-osteomyelitis or epidural abscess. 2. Small left subarticular disc protrusion at T6-7 without spinal canal stenosis. 3. Small left subarticular disc protrusion at L4-5 mildly narrowing the left lateral recess. Otherwise normal thoracic and lumbar spine. Electronically Signed   By: Ulyses Jarred M.D.   On: 12/17/2021 00:39   MR Lumbar Spine W Wo Contrast  Result Date: 12/17/2021 CLINICAL DATA:  Low back pain with suspected infection. IV drug abuse. EXAM: MRI THORACIC AND LUMBAR SPINE WITHOUT AND WITH CONTRAST TECHNIQUE:  Multiplanar and multiecho pulse sequences of the thoracic and lumbar spine were obtained without and with intravenous contrast. CONTRAST:  56mL GADAVIST GADOBUTROL 1 MMOL/ML IV SOLN COMPARISON:  None Available. FINDINGS: MRI THORACIC SPINE FINDINGS Alignment:  Physiologic. Vertebrae: No fracture, evidence of discitis, or bone lesion. Cord:  Normal signal and morphology. Paraspinal and other soft tissues: Negative. Disc levels: Small left subarticular disc protrusion at T6-7 without spinal canal stenosis. The other thoracic disc levels are normal. Postcontrast images of the thoracic spine are motion degraded but within that limitation there is no enhancing abnormality. MRI LUMBAR SPINE FINDINGS Segmentation:  Standard. Alignment:  Physiologic. Vertebrae:  No fracture, evidence of discitis, or bone lesion. Conus medullaris: Extends to the L1 level and appears normal. Paraspinal and other soft tissues: Normal Disc levels: At L4-5, there is Caleb Decock small left subarticular disc protrusion mildly narrowing the left lateral recess. No central spinal canal or neural foraminal stenosis. The other lumbar disc levels are normal. IMPRESSION: 1. No evidence of discitis-osteomyelitis or epidural abscess. 2. Small left subarticular disc protrusion at T6-7 without spinal canal stenosis. 3. Small left subarticular disc protrusion at L4-5 mildly narrowing the left lateral recess. Otherwise normal thoracic and lumbar spine. Electronically Signed   By: Ulyses Jarred M.D.   On: 12/17/2021 00:39     Scheduled Meds:  buprenorphine-naloxone  1 tablet Sublingual BID  lidocaine  1 patch Transdermal Q24H   mupirocin ointment  1 application. Nasal BID   nicotine  21 mg Transdermal Daily   polyethylene glycol  17 g Oral Daily   Continuous Infusions:  cefTRIAXone (ROCEPHIN)  IV 1 g (12/18/21 1016)     LOS: 4 days    Time spent: over 30 min    Fayrene Helper, MD [facility]   To contact the attending provider between  7A-7P or the covering provider during after hours 7P-7A, please log into the web site [email or web address] and access using universal [place] password for that web site. If you do not have the password, please call the hospital operator.  12/18/2021, 1:16 PM

## 2021-12-18 NOTE — Assessment & Plan Note (Signed)
-  Nicotine patch 

## 2021-12-18 NOTE — Evaluation (Signed)
Occupational Therapy Evaluation Patient Details Name: Stephen Shepard MRN: [id number] DOB: Jul 02, 1991 Today's Date: 12/18/2021   History of Present Illness Stephen Shepard is a 31 y.o. male with medical history significant of seizure not on antiepileptics and history of IV heroin drug use who presents with concerns of worsening abscess.   Clinical Impression   Stephen Shepard is a 31 year old man who presents with above medical history. On evaluation he presents without functional use of right dominant upper extremity, decreased activity tolerance, impaired balance and confusion resulting in needing assistance to ambulate and for Adls. Patient required min +2 assistance for ambulation in hall using walker- mostly for safety. He needs assistance for all ADLs. He is emotionally labile during evaluation moving drastically from crying to exhibiting joy at walking. Patient's fiance in room and encouraging patient. Educated to use [place] as much as possible. Expect patient should recover while in hospital - several days - not require further OT services at discharge. Patient will benefit from skilled OT services while in hospital to improve deficits and learn compensatory strategies as needed in order to return to PLOF.       Recommendations for follow up therapy are one component of a multi-disciplinary discharge planning process, led by the attending physician.  Recommendations may be updated based on patient status, additional functional criteria and insurance authorization.   Follow Up Recommendations  No OT follow up    Assistance Recommended at Discharge Frequent or constant Supervision/Assistance  Patient can return home with the following A little help with walking and/or transfers;A lot of help with bathing/dressing/bathroom;Assistance with cooking/housework;Direct supervision/assist for financial management;Direct supervision/assist for medications management    Functional Status Assessment   Patient has had a recent decline in their functional status and demonstrates the ability to make significant improvements in function in a reasonable and predictable amount of time.  Equipment Recommendations  None recommended by OT    Recommendations for Other Services       Precautions / Restrictions Precautions Precautions: Fall Precaution Comments: R shoulder dressing s/p I & D Required Braces or Orthoses: Splint/Cast Splint/Cast: R forearm Restrictions Weight Bearing Restrictions: No      Mobility Bed Mobility                    Transfers                          Balance Overall balance assessment: Needs assistance Sitting-balance support: No upper extremity supported Sitting balance-Leahy Scale: Fair     Standing balance support: Bilateral upper extremity supported Standing balance-Leahy Scale: Poor                             ADL either performed or assessed with clinical judgement   ADL Overall ADL's : Needs assistance/impaired Eating/Feeding: Set up   Grooming: Set up;Sitting   Upper Body Bathing: Moderate assistance;Sitting   Lower Body Bathing: Minimal assistance;Sit to/from stand   Upper Body Dressing : Minimal assistance;Sitting   Lower Body Dressing: Sit to/from stand;Maximal assistance   Toilet Transfer: Minimal assistance;Rolling walker (2 wheels);Regular Toilet;Grab bars   Toileting- Clothing Manipulation and Hygiene: Moderate assistance;Sit to/from stand         General ADL Comments: Ambulated in hall holding on to walker with +2 assistance. Impaired balance and drifting .     Vision Patient Visual Report: No change from baseline  Perception     Praxis      Pertinent Vitals/Pain Pain Assessment Pain Assessment: Faces Faces Pain Scale: Hurts little more Pain Location: RUE Pain Descriptors / Indicators: Grimacing Pain Intervention(s): Monitored during session     Hand Dominance Right    Extremity/Trunk Assessment Upper Extremity Assessment Upper Extremity Assessment: RUE deficits/detail;LUE deficits/detail RUE Deficits / Details: Shoulder and elbow grossly functional - long splint on forearm. Able to wiggle fingers and thumb LUE Deficits / Details: WFL ROM and strength LUE Sensation: WNL LUE Coordination: WNL   Lower Extremity Assessment Lower Extremity Assessment: Defer to PT evaluation   Cervical / Trunk Assessment Cervical / Trunk Assessment: Normal   Communication Communication Communication: No difficulties   Cognition Arousal/Alertness: Awake/alert Behavior During Therapy:  (Emotional) Overall Cognitive Status: Within Functional Limits for tasks assessed                                 General Comments: He is able to follow commands. Able to answer PLOF. He is labile. Decreased attention.     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 Comments: independent        OT Problem List: Decreased activity tolerance;Impaired balance (sitting and/or standing);Decreased cognition;Decreased safety awareness;Decreased knowledge of use of DME or AE;Impaired UE functional use;Pain      OT Treatment/Interventions: Self-care/ADL training;Therapeutic exercise;DME and/or AE instruction;Therapeutic activities;Balance training;Patient/family education;Cognitive remediation/compensation    OT  Goals(Current goals can be found in the care plan section) Acute Rehab OT Goals Patient Stated Goal: to go to bathroom OT Goal Formulation: With patient/family Time For Goal Achievement: 01/01/22 Potential to Achieve Goals: Good  OT Frequency: Min 2X/week    Co-evaluation PT/OT/SLP Co-Evaluation/Treatment: Yes (co-eval) Reason for Co-Treatment: Necessary to address cognition/behavior during functional activity;To address functional/ADL transfers;For patient/therapist safety          AM-PAC OT "6 Clicks" Daily Activity     Outcome Measure Help from another person eating meals?: A Little Help from another person taking care of personal grooming?: A Little Help from another person toileting, which includes using toliet, bedpan, or urinal?: A Lot Help from another person bathing (including washing, rinsing, drying)?: A Lot Help from another person to put on and taking off regular upper body clothing?: A Little Help from another person to put on and taking off regular lower body clothing?: A Lot 6 Click Score: 15   End of Session Equipment Utilized During Treatment: Rolling walker (2 wheels);Gait belt Nurse Communication: Mobility status  Activity Tolerance: Patient tolerated treatment well Patient left: in chair;with call bell/phone within reach;with family/visitor present  OT Visit Diagnosis: Unsteadiness on feet (R26.81);Pain;Other symptoms and signs involving cognitive function Pain - Right/Left: Right Pain - part of body: Arm  [id number] OT Time Calculation (min): 20 min Charges:  OT General Charges $OT Visit: 1 Visit OT Evaluation $OT Eval Low Complexity: 1 Low  Deran Barro, OTR/L [place]  Office [phone] Pager: [place] 12/18/2021, 3:26 PM

## 2021-12-18 NOTE — Progress Notes (Signed)
PHARMACY - PHYSICIAN COMMUNICATION CRITICAL VALUE ALERT - BLOOD CULTURE IDENTIFICATION (BCID)  Stephen Shepard is an 31 y.o. male who presented to [facility] on 12/14/2021 with a chief complaint of abscess  Assessment:  1/4 with [place]  Name of physician (or Provider) Contacted: n/a  Current antibiotics: micafungin  Changes to prescribed antibiotics recommended:  Patient already on appropriate antifungal  Results for orders placed or performed during the hospital encounter of 12/14/21  Blood Culture ID Panel (Reflexed) (Collected: 12/14/2021  5:25 PM)  Result Value Ref Range   Enterococcus faecalis NOT DETECTED NOT DETECTED   Enterococcus Faecium NOT DETECTED NOT DETECTED   Listeria monocytogenes NOT DETECTED NOT DETECTED   Staphylococcus species NOT DETECTED NOT DETECTED   Staphylococcus aureus (BCID) NOT DETECTED NOT DETECTED   Staphylococcus epidermidis NOT DETECTED NOT DETECTED   Staphylococcus lugdunensis NOT DETECTED NOT DETECTED   Streptococcus species NOT DETECTED NOT DETECTED   Streptococcus agalactiae NOT DETECTED NOT DETECTED   Streptococcus pneumoniae NOT DETECTED NOT DETECTED   Streptococcus pyogenes NOT DETECTED NOT DETECTED   A.calcoaceticus-baumannii NOT DETECTED NOT DETECTED   Bacteroides fragilis NOT DETECTED NOT DETECTED   Enterobacterales NOT DETECTED NOT DETECTED   Enterobacter cloacae complex NOT DETECTED NOT DETECTED   Escherichia coli NOT DETECTED NOT DETECTED   Klebsiella aerogenes NOT DETECTED NOT DETECTED   Klebsiella oxytoca NOT DETECTED NOT DETECTED   Klebsiella pneumoniae NOT DETECTED NOT DETECTED   Proteus species NOT DETECTED NOT DETECTED   Salmonella species NOT DETECTED NOT DETECTED   Serratia marcescens NOT DETECTED NOT DETECTED   Haemophilus influenzae NOT DETECTED NOT DETECTED   Neisseria meningitidis NOT DETECTED NOT DETECTED   Pseudomonas aeruginosa NOT DETECTED NOT DETECTED   Stenotrophomonas maltophilia NOT DETECTED NOT DETECTED    Candida albicans NOT DETECTED NOT DETECTED   Candida auris NOT DETECTED NOT DETECTED   Candida glabrata DETECTED (A) NOT DETECTED   Candida krusei NOT DETECTED NOT DETECTED   Candida parapsilosis NOT DETECTED NOT DETECTED   Candida tropicalis NOT DETECTED NOT DETECTED   Cryptococcus neoformans/gattii NOT DETECTED NOT DETECTED    [name] 12/18/2021  4:59 PM

## 2021-12-18 NOTE — Evaluation (Signed)
Physical Therapy Evaluation Patient Details Name: Stephen Shepard MRN: [id number] DOB: 01/26/1991 Today's Date: 12/18/2021  History of Present Illness  31 y.o. male with medical history significant of seizure not on antiepileptics and history of IV heroin drug use who presents with concerns of worsening abscess.  Clinical Impression  On eval, pt required Min Assist +2 safety/equipment. He walked ~175 feet with a RW. He is unsteady and at risk for falls when mobilizing. Poor attention to task at times. Emotionally labile. He required encouragement for participation. Fiance was present during session-she works. Will plan to follow and progress activity as tolerated. Hopefully pt will progress well over next few days.      Recommendations for follow up therapy are one component of a multi-disciplinary discharge planning process, led by the attending physician.  Recommendations may be updated based on patient status, additional functional criteria and insurance authorization.  Follow Up Recommendations No PT follow up    Assistance Recommended at Discharge Frequent or constant Supervision/Assistance  Patient can return home with the following  A little help with walking and/or transfers;A little help with bathing/dressing/bathroom;Assist for transportation;Assistance with cooking/housework;Help with stairs or ramp for entrance    Equipment Recommendations Rolling walker (2 wheels) (possibly-continuing to assess)  Recommendations for Other Services       Functional Status Assessment Patient has had a recent decline in their functional status and demonstrates the ability to make significant improvements in function in a reasonable and predictable amount of time.     Precautions / Restrictions Precautions Precautions: Fall Precaution Comments: R shoulder dressing s/p I & D Required Braces or Orthoses: Splint/Cast Splint/Cast: R forearm Restrictions Weight Bearing Restrictions: No       Mobility  Bed Mobility Overal bed mobility: Needs Assistance Bed Mobility: Supine to Sit     Supine to sit: Min assist, HOB elevated     General bed mobility comments: Increased time. Encouragement required. Some assist to get to EOB to encourage participation. He is capable of performing task unassisted.    Transfers Overall transfer level: Needs assistance Equipment used: Rolling walker (2 wheels) Transfers: Sit to/from Stand Sit to Stand: Min assist           General transfer comment: Assist to steady. Cues for safety.    Ambulation/Gait Ambulation/Gait assistance: Min assist, +2 safety/equipment Gait Distance (Feet): 175 Feet Assistive device: Rolling walker (2 wheels) Gait Pattern/deviations: Decreased stride length       General Gait Details: Unsteady. Poor attention to task at times. Encouragement required.  Stairs            Wheelchair Mobility    Modified Rankin (Stroke Patients Only)       Balance Overall balance assessment: Needs assistance         Standing balance support: Bilateral upper extremity supported, During functional activity Standing balance-Leahy Scale: Poor                               Pertinent Vitals/Pain Pain Assessment Pain Assessment: Faces Faces Pain Scale: Hurts even more Pain Location: RUE Pain Descriptors / Indicators: Grimacing, Guarding Pain Intervention(s): Monitored during session    Home Living Family/patient expects to be discharged to:: Private residence Living Arrangements: Spouse/significant other Available Help at Discharge: Family;Available PRN/intermittently   Home Access: Level entry       Home Layout: One level Home Equipment: None      Prior Function Prior Level of Function :  Independent/Modified Independent             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 Able to wiggle fingers and thumb LUE Deficits / Details: WFL ROM and strength LUE Sensation: WNL LUE Coordination: WNL    Lower Extremity Assessment Lower Extremity Assessment: Generalized weakness    Cervical / Trunk Assessment Cervical / Trunk Assessment: Normal  Communication   Communication: No difficulties  Cognition Arousal/Alertness: Awake/alert   Overall Cognitive Status: Impaired/Different from baseline Area of Impairment: Attention, Problem solving                             Problem Solving: Requires verbal cues General Comments: He is able to follow commands. Able to answer PLOF. He is labile. Decreased attention. Lacks motivation        General Comments      Exercises     Assessment/Plan    PT Assessment Patient needs continued PT services  PT Problem List Decreased strength;Decreased mobility;Decreased activity tolerance;Decreased balance;Decreased knowledge of use of DME;Pain;Decreased cognition       PT Treatment Interventions DME instruction;Therapeutic activities;Gait training;Therapeutic exercise;Patient/family education;Functional mobility training;Balance training    PT Goals (Current goals can be found in the Care Plan section)  Acute Rehab PT Goals Patient Stated Goal: none stated PT Goal Formulation: With patient/family Time For Goal Achievement: 01/01/22 Potential to Achieve Goals: Good    Frequency Min 3X/week     Co-evaluation   Reason for Co-Treatment: Necessary to address cognition/behavior during functional activity;To address functional/ADL transfers;For patient/therapist safety           AM-PAC PT "6 Clicks" Mobility  Outcome Measure Help needed turning from your back to your side while in a flat bed without using  bedrails?: A Little Help needed moving from lying on your back to sitting on the side of a flat bed without using bedrails?: A Little Help needed moving to and from a bed to a chair (including a wheelchair)?: A Little Help needed standing up from a chair using your arms (e.g., wheelchair or bedside chair)?: A Little Help needed to walk in hospital room?: A Lot Help needed climbing 3-5 steps with a railing? : A Lot 6 Click Score: 16    End of Session Equipment Utilized During Treatment: Gait belt Activity Tolerance: Patient tolerated treatment well Patient left: in chair;with call bell/phone within reach;with family/visitor present   PT Visit Diagnosis: Pain;Muscle weakness (generalized) (M62.81);Unsteadiness on feet (R26.81) Pain - Right/Left: Right Pain - part of body: Shoulder;Arm    Time: [id number] PT Time Calculation (min) (ACUTE ONLY): 18 min   Charges:   PT Evaluation $PT Eval Moderate Complexity: 1 Mod             Doreatha Massed, PT Acute Rehabilitation  Office: [phone] Pager: [phone]

## 2021-12-19 ENCOUNTER — Inpatient Hospital Stay (HOSPITAL_COMMUNITY): Payer: Self-pay

## 2021-12-19 DIAGNOSIS — B182 Chronic viral hepatitis C: Secondary | ICD-10-CM

## 2021-12-19 DIAGNOSIS — M545 Low back pain, unspecified: Secondary | ICD-10-CM

## 2021-12-19 DIAGNOSIS — R0602 Shortness of breath: Secondary | ICD-10-CM

## 2021-12-19 DIAGNOSIS — A419 Sepsis, unspecified organism: Secondary | ICD-10-CM

## 2021-12-19 DIAGNOSIS — L02511 Cutaneous abscess of right hand: Secondary | ICD-10-CM

## 2021-12-19 DIAGNOSIS — R079 Chest pain, unspecified: Secondary | ICD-10-CM

## 2021-12-19 DIAGNOSIS — B377 Candidal sepsis: Secondary | ICD-10-CM

## 2021-12-19 DIAGNOSIS — R2681 Unsteadiness on feet: Secondary | ICD-10-CM

## 2021-12-19 LAB — ECHOCARDIOGRAM COMPLETE
AR max vel: 3.12 cm2
AV Area VTI: 2.76 cm2
AV Area mean vel: 3.09 cm2
AV Mean grad: 4 mmHg
AV Peak grad: 9 mmHg
Ao pk vel: 1.5 m/s
Area-P 1/2: 2.76 cm2
Calc EF: 60.1 %
Height: 74 in
S' Lateral: 3.1 cm
Single Plane A2C EF: 59 %
Single Plane A4C EF: 59.1 %
Weight: 2268.09 oz

## 2021-12-19 MED ORDER — THIAMINE HCL 100 MG/ML IJ SOLN
500.0000 mg | Freq: Three times a day (TID) | INTRAVENOUS | Status: AC
  Administered 2021-12-19 – 2021-12-22 (×8): 500 mg via INTRAVENOUS
  Filled 2021-12-19 (×9): qty 5

## 2021-12-19 MED ORDER — SODIUM CHLORIDE 0.9 % IV SOLN
3.0000 g | Freq: Four times a day (QID) | INTRAVENOUS | Status: DC
  Administered 2021-12-19 – 2021-12-22 (×11): 3 g via INTRAVENOUS
  Filled 2021-12-19 (×12): qty 8

## 2021-12-19 MED ORDER — METRONIDAZOLE 500 MG PO TABS: 500.0000 mg | ORAL_TABLET | Freq: Two times a day (BID) | ORAL | Status: DC

## 2021-12-19 MED ORDER — THIAMINE HCL 100 MG PO TABS
100.0000 mg | ORAL_TABLET | Freq: Every day | ORAL | Status: DC
  Administered 2021-12-23 – 2021-12-28 (×6): 100 mg via ORAL
  Filled 2021-12-19 (×6): qty 1

## 2021-12-19 MED ORDER — HYDROXYZINE HCL 25 MG PO TABS
25.0000 mg | ORAL_TABLET | Freq: Four times a day (QID) | ORAL | Status: DC | PRN
  Administered 2021-12-20 – 2021-12-28 (×9): 25 mg via ORAL
  Filled 2021-12-19 (×9): qty 1

## 2021-12-19 NOTE — Assessment & Plan Note (Addendum)
Candida glabrata from 5/17 susceptibilities pending Repeat culture 5/23 micafungin TTE negative for endocarditis, planning for TEE 5/24

## 2021-12-19 NOTE — Progress Notes (Signed)
Chaplain will follow-up later today.  Stephen Shepard was having trouble keeping his eyes open.  Chaplain wants to honor his need for rest.     12/19/21 1000  Clinical Encounter Type  Visited With Patient not available  Visit Type Initial

## 2021-12-19 NOTE — Progress Notes (Signed)
Chaplain engage in an initial visit with Stephen Shepard. Chaplain checked in asking him how he was doing and what he needed.  He voiced that he did want to eat but that it was hard to eat because he can't move.  Chaplain let Nurse know who was going to relay that message to the Nurse Tech. Chaplain also helped [place] get his phone which was behind his pillows because he was worried about missing his girlfriend's call.    Chaplain offered prayer over Stephen Shepard.  He welcomed and appreciated prayer.  Chaplain will follow-up.     12/19/21 1100  Clinical Encounter Type  Visited With Patient  Visit Type Initial;Spiritual support  Spiritual Encounters  Spiritual Needs Prayer

## 2021-12-19 NOTE — Assessment & Plan Note (Addendum)
I think he's gradually improving, mental status improved as well. Will hold off on MRI brain

## 2021-12-19 NOTE — Progress Notes (Signed)
PROGRESS NOTE    Stephen Shepard  [id number] DOB: 1990-08-01 DOA: 12/14/2021 PCP: Patient, [name] (Inactive)  Chief Complaint  Patient presents with   Abscess    Brief Narrative:  Stephen Shepard is Stephen Shepard 31 y.o. male with medical history significant of seizure not on antiepileptics and history of IV heroin drug use who presents with concerns of worsening abscess.    Assessment & Plan:   Principal Problem:   Abscess of multiple sites Active Problems:   Candidemia (Stephen Shepard)   Anxiety   Shortness of breath   IVDU (intravenous drug user)   Back pain   Unsteady gait when walking   AKI (acute kidney injury) (Stephen Shepard)   Abdominal discomfort   Hepatitis C infection   Chest pain   Tobacco abuse   Prediabetes   Sepsis (Stephen Shepard)   Chronic hepatitis C without hepatic coma (Stephen Shepard)   Assessment and Plan: * Abscess of multiple sites Sepsis related to multiple abscesses, tachycardic, leukocytosis at presentation -> ruled in R hand abscess and R shoulder abscess.  Several other locations appear to have spontaneously drained.  Several on L arm that appear to have spontaneously drained/improved.  [place] with heterogenous collection along dorsum of R hand and small collection in the anterior R shoulder S/p right shoulder incision and drainage subcutaneous abscess, R wrist and dorsum of the hand drainage of deep abscess, right hand fourth dorsal compartment tenosynovectomy, right hand third dorsal compartment tenosynovectomy, right hand second dorsal compartment tenosynovectomy on 5/18 Recommending daily packing changes to wound on shoulder and dry dressing on incision For R wrist and forearm, recommending keep splint in place and they'll see him back on Monday Follow operative cultures -> hand abscess with strep intermedius, haemophilus parainfluenzae, beta lactamase negative.  Shoulder abscess with strep intermedius. Blood cx growing candida glabrata Repeat cultures 5/22 pending collection (will try again,  difficult stick) Appreciate ID recs, recommending change to unasyn Suspect related to most likely related to IVDU - close attention to blood cultures given multiple abscesses.   Candidemia (Stephen Shepard) Candida glabrata from 5/17 Repeat culture pending micafungin TTE negative for endocarditis, planning for TEE, will consult cards  Anxiety Paranoid, anxious at times Wants to leave at times and questioning overall plan of care - I was able to encourage him and educate him to stay  ? Component of withdrawal - see below Repeat utox  Will follow for now  Shortness of breath CT PE protocol with mild dependent bibasilar airspace opacities with associated volume loss favored to reflect atelectasis, small volume aspiration is Stephen Shepard consideration follow  Unsteady gait when walking Possibly all related to acute infection, deconditioning, but required walker yesterday to ambulate, also mental status waxing/waning Will get MRI brain w/ and w/o contrast  Back pain C/o thoracic back pain Plain films unremarkable CT t spine without evidence of fracture, malalignment, or destructive process within thoracic spine MRI T/L spine without evidence of infection No LE weakness or saddle anesthesia Improved with lidocaine patch   IVDU (intravenous drug user) Reports last heroin use Emillie Chasen month ago -negative HIV -follow RPR  -follow acute hepatitis panel -> positive hep C ab Started on suboxone 5/20.  Will need to plan for discharge, taper vs follow outpatient with prescriber.    Abdominal discomfort resolved  AKI (acute kidney injury) (Stephen Shepard) Resolved, follow  Hepatitis C infection Follow outpatient  Tobacco abuse Nicotine patch  Chest pain negative troponin CT done yesterday without PE Will ctm   Prediabetes noted  DVT prophylaxis: lovenox Code Status: full Family Communication: called sig other, no answer Disposition:   Status is: Inpatient Remains inpatient appropriate because:  need for IV abx, hand surgery [place]   Consultants:  ortho  Procedures:   Right shoulder incision and drainage subcutaneous abscess Right wrist and dorsum of the hand drainage of deep abscess Right hand fourth dorsal compartment tenosynovectomy Right hand third dorsal compartment tenosynovectomy Right hand second dorsal compartment tenosynovectomy   Echo IMPRESSIONS     1. Left ventricular ejection fraction, by estimation, is 55 to 60%. The  left ventricle has normal function. The left ventricle has no regional  wall motion abnormalities. Left ventricular diastolic parameters are  consistent with Grade I diastolic  dysfunction (impaired relaxation). The average left ventricular global  longitudinal strain is -16.5 %. The global longitudinal strain is  abnormal.   2. Right ventricular systolic function is normal. The right ventricular  size is normal.   3. The mitral valve is normal in structure. Trivial mitral valve  regurgitation.   4. The aortic valve is tricuspid. Aortic valve regurgitation is not  visualized. No aortic stenosis is present.   5. The inferior vena cava is dilated in size with <50% respiratory  variability, suggesting right atrial pressure of 15 mmHg.   Comparison(s): No prior Echocardiogram.   Conclusion(s)/Recommendation(s): No evidence of valvular vegetations on  this transthoracic echocardiogram. Consider Stephen Shepard transesophageal  echocardiogram to exclude infective endocarditis if clinically indicated.  Antimicrobials:  Anti-infectives (From admission, onward)    Start     Dose/Rate Route Frequency Ordered Stop   12/19/21 2200  Ampicillin-Sulbactam (UNASYN) 3 g in sodium chloride 0.9 % 100 mL IVPB        3 g 200 mL/hr over 30 Minutes Intravenous Every 6 hours 12/19/21 1256     12/19/21 2200  metroNIDAZOLE (FLAGYL) tablet 500 mg  Status:  Discontinued        500 mg Oral Every 12 hours 12/19/21 1240 12/19/21 1256   12/18/21 1800  micafungin (MYCAMINE)  100 mg in sodium chloride 0.9 % 100 mL IVPB        100 mg 110 mL/hr over 1 Hours Intravenous Daily 12/18/21 1557     12/18/21 1800  metroNIDAZOLE (FLAGYL) tablet 500 mg  Status:  Discontinued        500 mg Oral Every 8 hours 12/18/21 1559 12/19/21 1240   12/16/21 1000  cefTRIAXone (ROCEPHIN) 1 g in sodium chloride 0.9 % 100 mL IVPB  Status:  Discontinued        1 g 200 mL/hr over 30 Minutes Intravenous Every 24 hours 12/16/21 0817 12/19/21 1256   12/15/21 2000  vancomycin (VANCOREADY) IVPB 1750 mg/350 mL  Status:  Discontinued        1,750 mg 175 mL/hr over 120 Minutes Intravenous Every 24 hours 12/14/21 2351 12/15/21 0953   12/15/21 1215  ceFAZolin (ANCEF) IVPB 2g/100 mL premix        2 g 200 mL/hr over 30 Minutes Intravenous On call to O.R. 12/15/21 1206 12/15/21 1635   12/15/21 1000  vancomycin (VANCOREADY) IVPB 1250 mg/250 mL  Status:  Discontinued        1,250 mg 166.7 mL/hr over 90 Minutes Intravenous Every 12 hours 12/15/21 0954 12/18/21 1221   12/14/21 2345  azithromycin (ZITHROMAX) 500 mg in sodium chloride 0.9 % 250 mL IVPB  Status:  Discontinued        500 mg 250 mL/hr over 60 Minutes Intravenous Every 24 hours 12/14/21  2337 12/16/21 1901   12/14/21 1930  vancomycin (VANCOCIN) IVPB 1000 mg/200 mL premix        1,000 mg 200 mL/hr over 60 Minutes Intravenous  Once 12/14/21 1920 12/14/21 2352       Subjective: No new complaints Asking to smoke   Objective: Vitals:   12/19/21 0607 12/19/21 0948 12/19/21 1538 12/19/21 1541  BP: 131/86 119/80  121/88  Pulse: (!) 42 (!) 50 (!) 53 (!) 51  Resp: 18 17  16   Temp: 97.7 F (36.5 C) 97.9 F (36.6 C) 100.2 F (37.9 C) 97.8 F (36.6 C)  TempSrc: Oral Oral Oral Oral  SpO2: 99% 100% 99% 98%  Weight:      Height:        Intake/Output Summary (Last 24 hours) at 12/19/2021 1644 Last data filed at 12/19/2021 1500 Gross per 24 hour  Intake 640 ml  Output 200 ml  Net 440 ml   Filed Weights   12/14/21 1622 12/15/21 1207   Weight: 74.8 kg 64.3 kg    Examination:  General: No acute distress. Cardiovascular: RRR Lungs: unlabored Abdomen: Soft, nontender, nondistended  Neurological: Alert and oriented 3.  More awake, more normal affect today.  Moves all extremities 4 with equal strength. Cranial nerves II through XII grossly intact. Extremities: R arm splint, dressing to shoulder    Data Reviewed: I have personally reviewed following labs and imaging studies  CBC: Recent Labs  Lab 12/14/21 1640 12/15/21 0424 12/17/21 0548  WBC 15.7* 15.0* 11.4*  NEUTROABS 12.3*  --  8.9*  HGB 13.8 12.7* 13.4  HCT 42.5 38.1* 39.1  MCV 87.1 85.0 82.8  PLT 435* 369 0000000    Basic Metabolic Panel: Recent Labs  Lab 12/14/21 1640 12/15/21 0424 12/16/21 0731 12/17/21 0548 12/18/21 0613  NA 139 139  --  139 140  K 3.8 4.1  --  4.0 3.8  CL 99 103  --  102 104  CO2 30 29  --  28 27  GLUCOSE 111* 114*  --  118* 123*  BUN 21* 15  --  13 20  CREATININE 1.61* 1.00 1.08 0.96 0.79  CALCIUM 10.1 9.2  --  9.5 9.0  MG  --   --   --  2.0 2.1  PHOS  --   --   --  3.3 2.9    GFR: Estimated Creatinine Clearance: 120.6 mL/min (by C-G formula based on SCr of 0.79 mg/dL).  Liver Function Tests: Recent Labs  Lab 12/17/21 0548 12/18/21 0613  AST 17 28  ALT 15 26  ALKPHOS 60 78  BILITOT 0.6 0.9  PROT 8.4* 7.0  ALBUMIN 3.3* 3.2*    CBG: No results for input(s): GLUCAP in the last 168 hours.   Recent Results (from the past 240 hour(s))  Blood culture (routine x 2)     Status: None (Preliminary result)   Collection Time: 12/14/21  5:25 PM   Specimen: BLOOD  Result Value Ref Range Status   Specimen Description   Final    BLOOD LEFT ANTECUBITAL Performed at [facility] [address] [place], [place] [address]    Special Requests   Final    BOTTLES DRAWN AEROBIC AND ANAEROBIC Blood Culture adequate volume Performed at [facility] [address] [place],  [place] [address]    Culture  Setup Time   Final    YEAST AEROBIC BOTTLE ONLY CRITICAL RESULT CALLED TO, READ BACK BY AND VERIFIED WITH: PHARMD  J.LEGGE AT 1721 ON 12/18/2021 BY T.SAAD. Performed at [facility], [place] [address] [place], [place] [address]    Culture YEAST  Final   Report Status PENDING  Incomplete  Blood Culture ID Panel (Reflexed)     Status: Abnormal   Collection Time: 12/14/21  5:25 PM  Result Value Ref Range Status   Enterococcus faecalis NOT DETECTED NOT DETECTED Final   Enterococcus Faecium NOT DETECTED NOT DETECTED Final   Listeria monocytogenes NOT DETECTED NOT DETECTED Final   Staphylococcus species NOT DETECTED NOT DETECTED Final   Staphylococcus aureus (BCID) NOT DETECTED NOT DETECTED Final   Staphylococcus epidermidis NOT DETECTED NOT DETECTED Final   Staphylococcus lugdunensis NOT DETECTED NOT DETECTED Final   Streptococcus species NOT DETECTED NOT DETECTED Final   Streptococcus agalactiae NOT DETECTED NOT DETECTED Final   Streptococcus pneumoniae NOT DETECTED NOT DETECTED Final   Streptococcus pyogenes NOT DETECTED NOT DETECTED Final   Seletha Zimmermann.calcoaceticus-baumannii NOT DETECTED NOT DETECTED Final   Bacteroides fragilis NOT DETECTED NOT DETECTED Final   Enterobacterales NOT DETECTED NOT DETECTED Final   Enterobacter cloacae complex NOT DETECTED NOT DETECTED Final   Escherichia coli NOT DETECTED NOT DETECTED Final   Klebsiella aerogenes NOT DETECTED NOT DETECTED Final   Klebsiella oxytoca NOT DETECTED NOT DETECTED Final   Klebsiella pneumoniae NOT DETECTED NOT DETECTED Final   Proteus species NOT DETECTED NOT DETECTED Final   Salmonella species NOT DETECTED NOT DETECTED Final   Serratia marcescens NOT DETECTED NOT DETECTED Final   Haemophilus influenzae NOT DETECTED NOT DETECTED Final   Neisseria meningitidis NOT DETECTED NOT DETECTED Final   Pseudomonas aeruginosa NOT DETECTED NOT DETECTED Final   Stenotrophomonas maltophilia NOT DETECTED NOT DETECTED Final    Candida albicans NOT DETECTED NOT DETECTED Final   Candida auris NOT DETECTED NOT DETECTED Final   Candida glabrata DETECTED (Jurni Cesaro) NOT DETECTED Final    Comment: CRITICAL RESULT CALLED TO, READ BACK BY AND VERIFIED WITH: PHARMD J.LEGGE AT 1721 ON 12/18/2021 BY T.SAAD.    Candida krusei NOT DETECTED NOT DETECTED Final   Candida parapsilosis NOT DETECTED NOT DETECTED Final   Candida tropicalis NOT DETECTED NOT DETECTED Final   Cryptococcus neoformans/gattii NOT DETECTED NOT DETECTED Final    Comment: Performed at [facility], [place] [address] [place], [place] [address]  Blood culture (routine x 2)     Status: None (Preliminary result)   Collection Time: 12/14/21 10:30 PM   Specimen: BLOOD  Result Value Ref Range Status   Specimen Description   Final    BLOOD LEFT ARM Performed at [facility] [address] [place], [place] [address]    Special Requests   Final    BOTTLES DRAWN AEROBIC AND ANAEROBIC Blood Culture adequate volume Performed at [facility] [address], [place], [place] [address]    Culture   Final    NO GROWTH 4 DAYS Performed at [facility], [place] [address], [place], [place] [address]    Report Status PENDING  Incomplete  Resp Panel by RT-PCR (Flu Bettyanne Dittman&B, Covid)     Status: None   Collection Time: 12/14/21 10:30 PM   Specimen: Nasopharyngeal(NP) swabs in vial transport medium  Result Value Ref Range Status   SARS Coronavirus 2 by RT PCR NEGATIVE NEGATIVE Final    Comment: (NOTE) SARS-CoV-2 target nucleic acids are NOT DETECTED.  The SARS-CoV-2 RNA is generally detectable in upper respiratory specimens during the acute phase of infection. The  lowest concentration of SARS-CoV-2 viral copies this assay can detect is 138 copies/mL. Korey Prashad negative result does not preclude SARS-Cov-2 infection and should not be used as the sole basis for treatment or other patient management decisions. Chermaine Schnyder negative result may occur with   improper specimen collection/handling, submission of specimen other than nasopharyngeal swab, presence of viral mutation(s) within the areas targeted by this assay, and inadequate number of viral copies(<138 copies/mL). Bellamy Rubey negative result must be combined with clinical observations, patient history, and epidemiological information. The expected result is Negative.  Fact Sheet for Patients:  BloggerCourse.com  Fact Sheet for Healthcare Providers:  SeriousBroker.it  This test is no t yet approved or cleared by the [place] FDA and  has been authorized for detection and/or diagnosis of SARS-CoV-2 by FDA under an Emergency Use Authorization (EUA). This EUA will remain  in effect (meaning this test can be used) for the duration of the COVID-19 declaration under Section 564(b)(1) of the Act, 21 U.S.C.section 360bbb-3(b)(1), unless the authorization is terminated  or revoked sooner.       Influenza Quita Mcgrory by PCR NEGATIVE NEGATIVE Final   Influenza B by PCR NEGATIVE NEGATIVE Final    Comment: (NOTE) The Xpert Xpress SARS-CoV-2/FLU/RSV plus assay is intended as an aid in the diagnosis of influenza from Nasopharyngeal swab specimens and should not be used as Adrien Shankar sole basis for treatment. Nasal washings and aspirates are unacceptable for Xpert Xpress SARS-CoV-2/FLU/RSV testing.  Fact Sheet for Patients: BloggerCourse.com  Fact Sheet for Healthcare Providers: SeriousBroker.it  This test is not yet approved or cleared by the [place] FDA and has been authorized for detection and/or diagnosis of SARS-CoV-2 by FDA under an Emergency Use Authorization (EUA). This EUA will remain in effect (meaning this test can be used) for the duration of the COVID-19 declaration under Section 564(b)(1) of the Act, 21 U.S.C. section 360bbb-3(b)(1), unless the authorization is terminated  or revoked.  Performed at [facility], [address], [place], [place] [address]   Surgical PCR screen     Status: None   Collection Time: 12/15/21 12:58 PM   Specimen: Nasal Mucosa; Nasal Swab  Result Value Ref Range Status   MRSA, PCR NEGATIVE NEGATIVE Final   Staphylococcus aureus NEGATIVE NEGATIVE Final    Comment: (NOTE) The Xpert SA Assay (FDA approved for NASAL specimens in patients 67 years of age and older), is one component of Dominyk Law comprehensive surveillance program. It is not intended to diagnose infection nor to guide or monitor treatment. Performed at [facility], [address], [place], [place] [address]   Aerobic/Anaerobic Culture w Gram Stain (surgical/deep wound)     Status: None (Preliminary result)   Collection Time: 12/15/21  5:06 PM   Specimen: PATH Other; Tissue  Result Value Ref Range Status   Specimen Description   Final    TISSUE Performed at [facility], [address] [place], [place] [address]    Special Requests   Final    NONE SHOULDER ABSCESS Performed at [facility], [address], [place], [place] [address]    Gram Stain   Final    ABUNDANT WBC PRESENT, PREDOMINANTLY PMN FEW GRAM POSITIVE COCCI Performed at [facility], [address] [place], [place] [address]    Culture   Final    ABUNDANT STREPTOCOCCUS INTERMEDIUS SUSCEPTIBILITIES PERFORMED ON PREVIOUS CULTURE WITHIN THE LAST 5 DAYS. NO ANAEROBES ISOLATED; CULTURE IN PROGRESS FOR 5 DAYS  Report Status PENDING  Incomplete  Aerobic/Anaerobic Culture w Gram Stain (surgical/deep wound)     Status: None   Collection Time: 12/15/21  5:06 PM   Specimen: PATH Other; Tissue  Result Value Ref Range Status   Specimen Description   Final    TISSUE Performed at [facility] [address] [place], [place] [address]    Special Requests   Final    NONE HAND ABSCESS Performed at  [facility], [place] [address] [place], [place] [address]    Gram Stain   Final    ABUNDANT WBC PRESENT, PREDOMINANTLY PMN FEW GRAM POSITIVE COCCI RARE GRAM NEGATIVE RODS    Culture   Final    ABUNDANT STREPTOCOCCUS INTERMEDIUS FEW HAEMOPHILUS PARAINFLUENZAE BETA LACTAMASE NEGATIVE ABUNDANT PREVOTELLA DENTICOLA BETA LACTAMASE POSITIVE Performed at [facility], [address] [place], [place] [address]    Report Status 12/18/2021 FINAL  Final   Organism ID, Bacteria STREPTOCOCCUS INTERMEDIUS  Final      Susceptibility   Streptococcus intermedius - MIC*    PENICILLIN <=0.06 SENSITIVE Sensitive     CEFTRIAXONE <=0.12 SENSITIVE Sensitive     ERYTHROMYCIN 4 RESISTANT Resistant     LEVOFLOXACIN <=0.25 SENSITIVE Sensitive     VANCOMYCIN 0.25 SENSITIVE Sensitive     * ABUNDANT STREPTOCOCCUS INTERMEDIUS  Urine Culture     Status: None   Collection Time: 12/17/21  8:07 AM   Specimen: Urine, Clean Catch  Result Value Ref Range Status   Specimen Description   Final    URINE, CLEAN CATCH Performed at [facility], [place] [address] [place], [place] [address]    Special Requests   Final    NONE Performed at [facility], [place] [address] [place], [place] [address]    Culture   Final    NO GROWTH Performed at [facility], [place] [address] [place], [place] [address]    Report Status 12/18/2021 FINAL  Final         Radiology Studies: ECHOCARDIOGRAM COMPLETE  Result Date: 12/19/2021    ECHOCARDIOGRAM REPORT   Patient Name:   [facility] Date of Exam: 12/19/2021 Medical Rec #:  [id number]    Height:       74.0 in Accession #:    [id number]   Weight:       141.8 lb Date of Birth:  05-Jun-1991    BSA:          1.878 m Patient Age:    30 years     BP:           131/86 mmHg Patient Gender: M            HR:           54 bpm. Exam Location:  Inpatient Procedure: 2D Echo, Cardiac Doppler, Color Doppler and Strain Analysis  Indications:    Sepsis  History:        Patient has no prior history of Echocardiogram examinations.                 Signs/Symptoms:Shortness of Breath and Chest Pain; Risk                 Factors:Current Smoker.  Sonographer:    Luisa Hart RDCS Referring Phys: [phone] Hansika Leaming CALDWELL POWELL [place]  1. Left ventricular ejection fraction, by estimation, is 55 to 60%. The left ventricle has normal function. The left ventricle has no regional  wall motion abnormalities. Left ventricular diastolic parameters are consistent with Grade I diastolic dysfunction (impaired relaxation). The average left ventricular global longitudinal strain is -16.5 %. The global longitudinal strain is abnormal.  2. Right ventricular systolic function is normal. The right ventricular size is normal.  3. The mitral valve is normal in structure. Trivial mitral valve regurgitation.  4. The aortic valve is tricuspid. Aortic valve regurgitation is not visualized. No aortic stenosis is present.  5. The inferior vena cava is dilated in size with <50% respiratory variability, suggesting right atrial pressure of 15 mmHg. Comparison(s): No prior Echocardiogram. Conclusion(s)/Recommendation(s): No evidence of valvular vegetations on this transthoracic echocardiogram. Consider Stephen Shepard transesophageal echocardiogram to exclude infective endocarditis if clinically indicated. FINDINGS  Left Ventricle: Left ventricular ejection fraction, by estimation, is 55 to 60%. The left ventricle has normal function. The left ventricle has no regional wall motion abnormalities. The average left ventricular global longitudinal strain is -16.5 %. The global longitudinal strain is abnormal. The left ventricular internal cavity size was normal in size. There is no left ventricular hypertrophy. Left ventricular diastolic parameters are consistent with Grade I diastolic dysfunction (impaired relaxation). Right Ventricle: The right ventricular size is normal. No increase in right  ventricular wall thickness. Right ventricular systolic function is normal. Left Atrium: Left atrial size was normal in size. Right Atrium: Right atrial size was normal in size. Pericardium: There is no evidence of pericardial effusion. Mitral Valve: The mitral valve is normal in structure. Trivial mitral valve regurgitation. Tricuspid Valve: The tricuspid valve is normal in structure. Tricuspid valve regurgitation is trivial. Aortic Valve: The aortic valve is tricuspid. Aortic valve regurgitation is not visualized. No aortic stenosis is present. Aortic valve mean gradient measures 4.0 mmHg. Aortic valve peak gradient measures 9.0 mmHg. Aortic valve area, by VTI measures 2.76 cm. Pulmonic Valve: The pulmonic valve was not well visualized. Pulmonic valve regurgitation is not visualized. Aorta: The aortic root is normal in size and structure. Venous: The inferior vena cava is dilated in size with less than 50% respiratory variability, suggesting right atrial pressure of 15 mmHg. IAS/Shunts: The atrial septum is grossly normal.  LEFT VENTRICLE PLAX 2D LVIDd:         4.90 cm     Diastology LVIDs:         3.10 cm     LV e' medial:    6.42 cm/s LV PW:         1.00 cm     LV E/e' medial:  6.7 LV IVS:        0.90 cm     LV e' lateral:   5.33 cm/s LVOT diam:     2.20 cm     LV E/e' lateral: 8.0 LV SV:         85 LV SV Index:   45          2D Longitudinal Strain LVOT Area:     3.80 cm    2D Strain GLS Avg:     -16.5 %  LV Volumes (MOD) LV vol d, MOD A2C: 73.4 ml LV vol d, MOD A4C: 91.7 ml LV vol s, MOD A2C: 30.1 ml LV vol s, MOD A4C: 37.5 ml LV SV MOD A2C:     43.3 ml LV SV MOD A4C:     91.7 ml LV SV MOD BP:      52.5 ml RIGHT VENTRICLE RV Basal diam:  4.20 cm RV Mid diam:    3.30 cm RV S  prime:     17.60 cm/s TAPSE (M-mode): 3.0 cm LEFT ATRIUM             Index        RIGHT ATRIUM           Index LA diam:        2.70 cm 1.44 cm/m   RA Area:     17.70 cm LA Vol (A2C):   26.4 ml 14.06 ml/m  RA Volume:   50.90 ml  27.11  ml/m LA Vol (A4C):   24.3 ml 12.94 ml/m LA Biplane Vol: 25.3 ml 13.47 ml/m  AORTIC VALVE                    PULMONIC VALVE AV Area (Vmax):    3.12 cm     PV Vmax:       0.76 m/s AV Area (Vmean):   3.09 cm     PV Vmean:      50.400 cm/s AV Area (VTI):     2.76 cm     PV VTI:        0.159 m AV Vmax:           150.00 cm/s  PV Peak grad:  2.3 mmHg AV Vmean:          94.700 cm/s  PV Mean grad:  1.0 mmHg AV VTI:            0.309 m AV Peak Grad:      9.0 mmHg AV Mean Grad:      4.0 mmHg LVOT Vmax:         123.00 cm/s LVOT Vmean:        77.100 cm/s LVOT VTI:          0.224 m LVOT/AV VTI ratio: 0.72  AORTA Ao Asc diam: 2.50 cm MITRAL VALVE               TRICUSPID VALVE MV Area (PHT): 2.76 cm    TR Peak grad:   23.8 mmHg MV Decel Time: 275 msec    TR Vmax:        244.00 cm/s MV E velocity: 42.70 cm/s MV Genessis Flanary velocity: 42.70 cm/s  SHUNTS MV E/[place]st [place] ratio:  1.00        Systemic VTI:  0.22 m                            Systemic Diam: 2.20 cm Gwyndolyn Kaufman MD Electronically signed by Gwyndolyn Kaufman MD Signature Date/Time: 12/19/2021/3:49:36 PM    Final      Scheduled Meds:  buprenorphine-naloxone  1 tablet Sublingual BID   lidocaine  1 patch Transdermal Q24H   mouth rinse  15 mL Mouth Rinse BID   mupirocin ointment  1 application. Nasal BID   nicotine  21 mg Transdermal Daily   polyethylene glycol  17 g Oral Daily   [START ON 12/22/2021] thiamine  100 mg Oral Daily   Continuous Infusions:  ampicillin-sulbactam (UNASYN) IV     micafungin (MYCAMINE) IV 100 mg (12/19/21 0959)   thiamine injection       LOS: 5 days    Time spent: over 30 min    Fayrene Helper, MD [facility]   To contact the attending provider between 7A-7P or the covering provider during after hours 7P-7A, please log into the web site [email or web address] and access using universal [place] password for that web site. If  you do not have the password, please call the hospital operator.  12/19/2021, 4:44 PM

## 2021-12-19 NOTE — Consult Note (Signed)
[facility]    Date of Admission:  12/14/2021     Reason for Consult: candida glabrata bsi, multiple skin abscesses, ivdu    Referring Provider: Bethanie Dicker   Abx: 5/21-c micafungin 5/21-c metronidazole 5/19-c ceftriaxone  5/17-21 vanc    Acute hepatitis panel positive for hepatitis c with viral load 1.2 million units  Assessment: 31 yo male with seizure/epilepsi, ivdu admitted initially 5/17 with multiple skin abscesses, found to have c glabrata bsi. Of note recently received amox-clav for sinusitis  He is s/p I&D of right shoulder, right wrist/hand dorsum abscesses and tenosynovectomy of right hand 2nd, 3rd, and 4th dorsal compartments on 5/18  I suspect this is due to skin popping/ivdu, not embolic phenomenom but some concern arises for endocarditis given higher risk and also candida glabrata bsi and recent abx use with augmentin suppressing bcx  5/17 bcx candida glabrata; resulted 5/21 5/18 operative cx strep intermedius, haemophilus parainfluenzae (betalactamase negative), prevotella denticola (beta lactamase positive) 5/21 repeat bcx pending  Echo ordered   Back pain but rather unconcerning mri thoracolumbar area   Other issues: Chronic hep c can be further addressed in ID clinic or gi clinic outpatient Would check syphilis as well Hiv and hep b negative  Plan: Continue micafungin. Ok to change ceftriaxone/flagyl to amp-sulbactam Follow up repeat blood cx Await tte Would get tee if tte negative, for reason of concern above with ivdu and prior abx use Discussed with primary team   I spent 75 minute reviewing data/chart, and coordinating care and >50% direct face to face time providing counseling/discussing diagnostics/treatment plan with patient   ------------------------------------------------ Principal Problem:   Abscess of multiple sites Active Problems:   IVDU (intravenous drug user)   Sepsis (HCC)   AKI  (acute kidney injury) (HCC)   Shortness of breath   Back pain   Hepatitis C antibody positive in blood   Chest pain   Prediabetes   Anxiety   Abdominal discomfort   Tobacco abuse    HPI: Stephen Shepard is a 31 y.o. 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, admitted 5/17 with several skin abscesses, found to also have candida glabrata bacteremia and acute lower back pain  He is a poor historian and a rather paranoid information giver so history somewhat corroborated with him and others via chart  He had received sinusitis treatment about 1-2 weeks prior with amox-clav which he said he finished  He was feeling well otherwise but reported "a few days" of acute onset painful lumps on right upper extremities  Denies fever, chill  Reported last use iv drug a week prior to admission  Doesn't volunteer where he inject, but said he is right handed  He also reports lower back pain of a few days as well without bladder/bowel incontinence or LE neurologic deficit  On arrival has aki and leukocytosis but no fever Xray hand/wrist right no osseous abnormality but with soft tissue swelling He was also observed to have right deltoid abscess Ortho was consulted and had I&D right deltoid and right wrist along with several fingers dorsal compartment on 5/18. Culture polymicrobial including strep intermedius, h parainfluenza, and prevotella  He was transitioned from vanc to ceftriaxone/flagyl Bcx returned 5 days into admission with candida glabrata  No visual changes, headache, or new rash Back pain still present stable  Had thoracolumbar mri that didn't see any osseous pyogenic focus or soft tissue pyogenic focus    History reviewed. No pertinent family  history.  Social History   Tobacco Use   Smoking status: Every Day    Packs/day: 1.00    Types: Cigarettes   Smokeless tobacco: Never  Vaping Use   Vaping Use: Never used  Substance Use Topics   Alcohol use: Never   Drug use: Never    Allergies   Allergen Reactions   Ibuprofen Anaphylaxis   Other Anaphylaxis    Kepra   Wasp Venom Protein Swelling    hives    Review of Systems: ROS All Other ROS was negative, except mentioned above   Past Medical History:  Diagnosis Date   Anxiety    Asthma    Seizures (HCC)    Substance abuse (HCC)        Scheduled Meds:  buprenorphine-naloxone  1 tablet Sublingual BID   lidocaine  1 patch Transdermal Q24H   mouth rinse  15 mL Mouth Rinse BID   metroNIDAZOLE  500 mg Oral Q8H   mupirocin ointment  1 application. Nasal BID   nicotine  21 mg Transdermal Daily   polyethylene glycol  17 g Oral Daily   Continuous Infusions:  cefTRIAXone (ROCEPHIN)  IV 1 g (12/19/21 0903)   micafungin (MYCAMINE) IV 100 mg (12/18/21 1740)   PRN Meds:.[EXPIRED] acetaminophen **FOLLOWED BY** acetaminophen, HYDROmorphone (DILAUDID) injection, naLOXone (NARCAN)  injection, nicotine polacrilex, oxyCODONE   OBJECTIVE: Blood pressure 131/86, pulse (!) 42, temperature 97.7 F (36.5 C), temperature source Oral, resp. rate 18, height 6\' 2"  (1.88 m), weight 64.3 kg, SpO2 99 %.  Physical Exam  General/constitutional: no distress, pleasant; appears rather cautious/anxious with our questioning and volunteering information HEENT: Normocephalic, PER, Conj Clear, EOMI, Oropharynx clear Neck supple CV: rrr no mrg Lungs: clear to auscultation, normal respiratory effort Abd: Soft, Nontender Ext/skin right wrist/hand wrapped in ace-bandaid; right shoulder lateral deltoid dressing clean/dry Neuro: nonfocal MSK: no other joint tenderness; lower back not examined at his request   Lab Results Lab Results  Component Value Date   WBC 11.4 (H) 12/17/2021   HGB 13.4 12/17/2021   HCT 39.1 12/17/2021   MCV 82.8 12/17/2021   PLT 392 12/17/2021    Lab Results  Component Value Date   CREATININE 0.79 12/18/2021   BUN 20 12/18/2021   NA 140 12/18/2021   K 3.8 12/18/2021   CL 104 12/18/2021   CO2 27 12/18/2021     Lab Results  Component Value Date   ALT 26 12/18/2021   AST 28 12/18/2021   ALKPHOS 78 12/18/2021   BILITOT 0.9 12/18/2021      Microbiology: Recent Results (from the past 240 hour(s))  Blood culture (routine x 2)     Status: None (Preliminary result)   Collection Time: 12/14/21  5:25 PM   Specimen: BLOOD  Result Value Ref Range Status   Specimen Description   Final    BLOOD LEFT ANTECUBITAL Performed at [facility], [address], [place], [place]    Special Requests   Final    BOTTLES DRAWN AEROBIC AND ANAEROBIC Blood Culture adequate volume Performed at [facility], [address], [place], [place]    Culture  Setup Time   Final    YEAST AEROBIC BOTTLE ONLY CRITICAL RESULT CALLED TO, READ BACK BY AND VERIFIED WITH: PHARMD J.LEGGE AT 1721 ON 12/18/2021 BY T.SAAD. Performed at [facility], [address] [place], [place]    Culture YEAST  Final   Report Status PENDING  Incomplete  Blood Culture ID Panel (Reflexed)     Status: Abnormal   Collection Time: 12/14/21  5:25 PM  Result Value Ref Range Status   Enterococcus faecalis NOT DETECTED NOT DETECTED Final   Enterococcus Faecium NOT DETECTED NOT DETECTED Final   Listeria monocytogenes NOT DETECTED NOT DETECTED Final   Staphylococcus species NOT DETECTED NOT DETECTED Final   Staphylococcus aureus (BCID) NOT DETECTED NOT DETECTED Final   Staphylococcus epidermidis NOT DETECTED NOT DETECTED Final   Staphylococcus lugdunensis NOT DETECTED NOT DETECTED Final   Streptococcus species NOT DETECTED NOT DETECTED Final   Streptococcus agalactiae NOT DETECTED NOT DETECTED Final   Streptococcus pneumoniae NOT DETECTED NOT DETECTED Final   Streptococcus pyogenes NOT DETECTED NOT DETECTED Final   A.calcoaceticus-baumannii NOT DETECTED NOT DETECTED Final   Bacteroides fragilis NOT DETECTED NOT DETECTED Final   Enterobacterales NOT DETECTED NOT DETECTED  Final   Enterobacter cloacae complex NOT DETECTED NOT DETECTED Final   Escherichia coli NOT DETECTED NOT DETECTED Final   Klebsiella aerogenes NOT DETECTED NOT DETECTED Final   Klebsiella oxytoca NOT DETECTED NOT DETECTED Final   Klebsiella pneumoniae NOT DETECTED NOT DETECTED Final   Proteus species NOT DETECTED NOT DETECTED Final   Salmonella species NOT DETECTED NOT DETECTED Final   Serratia marcescens NOT DETECTED NOT DETECTED Final   Haemophilus influenzae NOT DETECTED NOT DETECTED Final   Neisseria meningitidis NOT DETECTED NOT DETECTED Final   Pseudomonas aeruginosa NOT DETECTED NOT DETECTED Final   Stenotrophomonas maltophilia NOT DETECTED NOT DETECTED Final   Candida albicans NOT DETECTED NOT DETECTED Final   Candida auris NOT DETECTED NOT DETECTED Final   Candida glabrata DETECTED (A) NOT DETECTED Final    Comment: CRITICAL RESULT CALLED TO, READ BACK BY AND VERIFIED WITH: PHARMD J.LEGGE AT 1721 ON 12/18/2021 BY T.SAAD.    Candida krusei NOT DETECTED NOT DETECTED Final   Candida parapsilosis NOT DETECTED NOT DETECTED Final   Candida tropicalis NOT DETECTED NOT DETECTED Final   Cryptococcus neoformans/gattii NOT DETECTED NOT DETECTED Final    Comment: Performed at [facility], [address] [place], [place] [address]  Blood culture (routine x 2)     Status: None (Preliminary result)   Collection Time: 12/14/21 10:30 PM   Specimen: BLOOD  Result Value Ref Range Status   Specimen Description   Final    BLOOD LEFT ARM Performed at [facility], [address] [place], [place] [address]    Special Requests   Final    BOTTLES DRAWN AEROBIC AND ANAEROBIC Blood Culture adequate volume Performed at [facility], [address] [place], [place] [address]    Culture   Final    NO GROWTH 4 DAYS Performed at [facility], [address] [place], [place] [address]    Report Status PENDING  Incomplete  Resp Panel by RT-PCR  (Flu A&B, Covid)     Status: None   Collection Time: 12/14/21 10:30 PM   Specimen: Nasopharyngeal(NP) swabs in vial transport medium  Result Value Ref Range Status   SARS Coronavirus 2 by RT PCR NEGATIVE NEGATIVE Final    Comment: (NOTE) SARS-CoV-2 target nucleic acids are NOT DETECTED.  The SARS-CoV-2 RNA is generally detectable in upper respiratory specimens during the acute phase of infection. The lowest concentration of SARS-CoV-2 viral copies this assay can detect is 138 copies/mL. A negative result does not preclude SARS-Cov-2 infection and should not be used as the sole basis for treatment or other  patient management decisions. A negative result may occur with  improper specimen collection/handling, submission of specimen other than nasopharyngeal swab, presence of viral mutation(s) within the areas targeted by this assay, and inadequate number of viral copies(<138 copies/mL). A negative result must be combined with clinical observations, patient history, and epidemiological information. The expected result is Negative.  Fact Sheet for Patients:  BloggerCourse.com  Fact Sheet for Healthcare Providers:  SeriousBroker.it  This test is no t yet approved or cleared by the [place] FDA and  has been authorized for detection and/or diagnosis of SARS-CoV-2 by FDA under an Emergency Use Authorization (EUA). This EUA will remain  in effect (meaning this test can be used) for the duration of the COVID-19 declaration under Section 564(b)(1) of the Act, 21 U.S.C.section 360bbb-3(b)(1), unless the authorization is terminated  or revoked sooner.       Influenza A by PCR NEGATIVE NEGATIVE Final   Influenza B by PCR NEGATIVE NEGATIVE Final    Comment: (NOTE) The Xpert Xpress SARS-CoV-2/FLU/RSV plus assay is intended as an aid in the diagnosis of influenza from Nasopharyngeal swab specimens and should not be used as a sole basis  for treatment. Nasal washings and aspirates are unacceptable for Xpert Xpress SARS-CoV-2/FLU/RSV testing.  Fact Sheet for Patients: BloggerCourse.com  Fact Sheet for Healthcare Providers: SeriousBroker.it  This test is not yet approved or cleared by the [place] FDA and has been authorized for detection and/or diagnosis of SARS-CoV-2 by FDA under an Emergency Use Authorization (EUA). This EUA will remain in effect (meaning this test can be used) for the duration of the COVID-19 declaration under Section 564(b)(1) of the Act, 21 U.S.C. section 360bbb-3(b)(1), unless the authorization is terminated or revoked.  Performed at [facility], [address], [place], [place] [address]   Surgical PCR screen     Status: None   Collection Time: 12/15/21 12:58 PM   Specimen: Nasal Mucosa; Nasal Swab  Result Value Ref Range Status   MRSA, PCR NEGATIVE NEGATIVE Final   Staphylococcus aureus NEGATIVE NEGATIVE Final    Comment: (NOTE) The Xpert SA Assay (FDA approved for NASAL specimens in patients 51 years of age and older), is one component of a comprehensive surveillance program. It is not intended to diagnose infection nor to guide or monitor treatment. Performed at [facility], [address], [place], [place] [address]   Aerobic/Anaerobic Culture w Gram Stain (surgical/deep wound)     Status: None (Preliminary result)   Collection Time: 12/15/21  5:06 PM   Specimen: PATH Other; Tissue  Result Value Ref Range Status   Specimen Description   Final    TISSUE Performed at [facility], [address], [place], [place] [address]    Special Requests   Final    NONE SHOULDER ABSCESS Performed at [facility], [address], [place], [place] [address]    Gram Stain   Final    ABUNDANT WBC PRESENT, PREDOMINANTLY PMN FEW GRAM POSITIVE COCCI Performed at  [facility], [address] [place], [place] [address]    Culture   Final    ABUNDANT STREPTOCOCCUS INTERMEDIUS SUSCEPTIBILITIES PERFORMED ON PREVIOUS CULTURE WITHIN THE LAST 5 DAYS. NO ANAEROBES ISOLATED; CULTURE IN PROGRESS FOR 5 DAYS    Report Status PENDING  Incomplete  Aerobic/Anaerobic Culture w Gram Stain (surgical/deep wound)     Status: None   Collection Time: 12/15/21  5:06 PM   Specimen: PATH Other; Tissue  Result Value Ref Range Status   Specimen Description   Final    TISSUE Performed at [facility], [address], [place], [place] [address]    Special Requests   Final    NONE HAND ABSCESS Performed at [facility], [address], [place], [place] [address]    Gram Stain   Final    ABUNDANT WBC PRESENT, PREDOMINANTLY PMN FEW GRAM POSITIVE COCCI RARE GRAM NEGATIVE RODS    Culture   Final    ABUNDANT STREPTOCOCCUS INTERMEDIUS FEW HAEMOPHILUS PARAINFLUENZAE BETA LACTAMASE NEGATIVE ABUNDANT PREVOTELLA DENTICOLA BETA LACTAMASE POSITIVE Performed at [facility], [address], [place], [place] [address]    Report Status 12/18/2021 FINAL  Final   Organism ID, Bacteria STREPTOCOCCUS INTERMEDIUS  Final      Susceptibility   Streptococcus intermedius - MIC*    PENICILLIN <=0.06 SENSITIVE Sensitive     CEFTRIAXONE <=0.12 SENSITIVE Sensitive     ERYTHROMYCIN 4 RESISTANT Resistant     LEVOFLOXACIN <=0.25 SENSITIVE Sensitive     VANCOMYCIN 0.25 SENSITIVE Sensitive     * ABUNDANT STREPTOCOCCUS INTERMEDIUS  Urine Culture     Status: None   Collection Time: 12/17/21  8:07 AM   Specimen: Urine, Clean Catch  Result Value Ref Range Status   Specimen Description   Final    URINE, CLEAN CATCH Performed at [facility], [address] [place] [address], [place], [place] [address]    Special Requests   Final    NONE Performed at [facility], [address], [place], [place] [address]    Culture    Final    NO GROWTH Performed at [facility], [address] [place], [place] [address]    Report Status 12/18/2021 FINAL  Final     Serology:    Imaging: If present, new imagings (plain films, ct scans, and mri) have been personally visualized and interpreted; radiology reports have been reviewed. Decision making incorporated into the Impression / Recommendations.  5/18 ct abd/pelv with contrast 1. Mild dependent bibasilar airspace opacities with associated volume loss favored to reflect atelectasis. Small volume aspiration is a consideration in the appropriate clinical context. 2. Otherwise, no acute cardiopulmonary process. 3. No evidence of fracture, malalignment or destructive process within the thoracic spine.  5/20 mri thoracolumbar spine 1. No evidence of discitis-osteomyelitis or epidural abscess. 2. Small left subarticular disc protrusion at T6-7 without spinal canal stenosis. 3. Small left subarticular disc protrusion at L4-5 mildly narrowing the left lateral recess. Otherwise normal thoracic and lumbar spine.   Raymondo Band, MD [facility] [phone] pager    12/19/2021, 9:21 AM

## 2021-12-20 ENCOUNTER — Inpatient Hospital Stay (HOSPITAL_COMMUNITY): Payer: Self-pay

## 2021-12-20 DIAGNOSIS — B377 Candidal sepsis: Principal | ICD-10-CM

## 2021-12-20 LAB — COMPREHENSIVE METABOLIC PANEL
ALT: 23 U/L (ref 0–44)
AST: 24 U/L (ref 15–41)
Albumin: 3.3 g/dL — ABNORMAL LOW (ref 3.5–5.0)
Alkaline Phosphatase: 50 U/L (ref 38–126)
Anion gap: 8 (ref 5–15)
BUN: 15 mg/dL (ref 6–20)
CO2: 30 mmol/L (ref 22–32)
Calcium: 9.3 mg/dL (ref 8.9–10.3)
Chloride: 99 mmol/L (ref 98–111)
Creatinine, Ser: 1.1 mg/dL (ref 0.61–1.24)
GFR, Estimated: >60 mL/min (ref >60)
Glucose, Bld: 115 mg/dL — ABNORMAL HIGH (ref 70–99)
Potassium: 4.3 mmol/L (ref 3.5–5.1)
Sodium: 137 mmol/L (ref 135–145)
Total Bilirubin: 0.5 mg/dL (ref 0.3–1.2)
Total Protein: 8 g/dL (ref 6.5–8.1)

## 2021-12-20 LAB — CBC WITH DIFFERENTIAL/PLATELET
Abs Immature Granulocytes: 0.02 10*3/uL (ref 0.00–0.07)
Basophils Absolute: 0.1 10*3/uL (ref 0.0–0.1)
Basophils Relative: 1 %
Eosinophils Absolute: 0 10*3/uL (ref 0.0–0.5)
Eosinophils Relative: 0 %
HCT: 37.4 % — ABNORMAL LOW (ref 39.0–52.0)
Hemoglobin: 12.6 g/dL — ABNORMAL LOW (ref 13.0–17.0)
Immature Granulocytes: 0 %
Lymphocytes Relative: 29 %
Lymphs Abs: 2.5 10*3/uL (ref 0.7–4.0)
MCH: 28.8 pg (ref 26.0–34.0)
MCHC: 33.7 g/dL (ref 30.0–36.0)
MCV: 85.6 fL (ref 80.0–100.0)
Monocytes Absolute: 0.7 10*3/uL (ref 0.1–1.0)
Monocytes Relative: 8 %
Neutro Abs: 5.3 10*3/uL (ref 1.7–7.7)
Neutrophils Relative %: 62 %
Platelets: 440 10*3/uL — ABNORMAL HIGH (ref 150–400)
RBC: 4.37 MIL/uL (ref 4.22–5.81)
RDW: 13.5 % (ref 11.5–15.5)
WBC: 8.5 10*3/uL (ref 4.0–10.5)
nRBC: 0 % (ref 0.0–0.2)

## 2021-12-20 LAB — AEROBIC/ANAEROBIC CULTURE W GRAM STAIN (SURGICAL/DEEP WOUND)

## 2021-12-20 LAB — RAPID URINE DRUG SCREEN, HOSP PERFORMED
Amphetamines: NOT DETECTED
Barbiturates: NOT DETECTED
Benzodiazepines: POSITIVE — AB
Cocaine: NOT DETECTED
Opiates: POSITIVE — AB
Tetrahydrocannabinol: POSITIVE — AB

## 2021-12-20 LAB — SEDIMENTATION RATE: Sed Rate: 57 mm/hr — ABNORMAL HIGH (ref 0–16)

## 2021-12-20 LAB — PHOSPHORUS: Phosphorus: 3.7 mg/dL (ref 2.5–4.6)

## 2021-12-20 LAB — CULTURE, BLOOD (ROUTINE X 2)
Culture: NO GROWTH
Special Requests: ADEQUATE

## 2021-12-20 LAB — MAGNESIUM: Magnesium: 2.2 mg/dL (ref 1.7–2.4)

## 2021-12-20 MED ORDER — ENOXAPARIN SODIUM 40 MG/0.4ML IJ SOSY
40.0000 mg | PREFILLED_SYRINGE | INTRAMUSCULAR | Status: DC
  Administered 2021-12-20 – 2021-12-26 (×3): 40 mg via SUBCUTANEOUS
  Filled 2021-12-20 (×7): qty 0.4

## 2021-12-20 NOTE — Progress Notes (Signed)
Patient ID: Stephen Shepard, male   DOB: 12-03-1990, 31 y.o.   MRN: [id number]   LOS: 6 days   Subjective: Pt reports subjective improvement in hand/wrist pain though says he slept on it wrong and it's hurting a little more today.   Objective: Vital signs in last 24 hours: Temp:  [97.8 F (36.6 C)-100.2 F (37.9 C)] 99.5 F (37.5 C) (05/23 1359) Pulse Rate:  [48-60] 60 (05/23 1359) Resp:  [14-16] 14 (05/23 0500) BP: (107-132)/(62-88) 107/62 (05/23 1359) SpO2:  [98 %-99 %] 99 % (05/23 1359) Last BM Date : 12/18/21   Laboratory  CBC Recent Labs    12/20/21 1009  WBC 8.5  HGB 12.6*  HCT 37.4*  PLT 440*   BMET Recent Labs    12/18/21 0613 12/20/21 1009  NA 140 137  K 3.8 4.3  CL 104 99  CO2 27 30  GLUCOSE 123* 115*  BUN 20 15  CREATININE 0.79 1.10  CALCIUM 9.0 9.3     Physical Exam General appearance: alert and no distress Right hand: Incision C/D/I w/o discharge or erythema. Motor/sensation grossly intact with painless AROM of wrist (though small arc). Dressing and splint reapplied.   Assessment/Plan: RUE infection -- Seems to be heading the right direction. F/u with Dr. Melvyn Novas later this week or early next.     Freeman Caldron, PA-C Orthopedic Surgery [phone] 12/20/2021

## 2021-12-20 NOTE — Progress Notes (Addendum)
PROGRESS NOTE    [place]  [id number] DOB: 1991/07/26 DOA: 12/14/2021 PCP: Patient, [name] (Inactive)  Chief Complaint  Patient presents with   Abscess    Brief Narrative:  Stephen Shepard is Stephen Shepard 31 y.o. male with medical history significant of seizure not on antiepileptics and history of IV heroin drug use who presents with concerns of worsening abscess.  He's now s/p surgery with orthopedics/Stephen Shepard.  Culture directed abx.  Blood cultures with candida glabrata, ID consulted and workup underway.  See below for additional details    Assessment & Plan:   Principal Problem:   Abscess of multiple sites Active Problems:   Candidemia (Stephen Shepard)   Anxiety   Shortness of breath   IVDU (intravenous drug user)   Back pain   Unsteady gait when walking   AKI (acute kidney injury) (Stephen Shepard)   Abdominal discomfort   Hepatitis C infection   Chest pain   Tobacco abuse   Prediabetes   Sepsis (Stephen Shepard)   Chronic hepatitis C without hepatic coma (HCC)   Assessment and Plan: * Abscess of multiple sites Sepsis related to multiple abscesses, tachycardic, leukocytosis at presentation -> ruled in R Stephen Shepard abscess and R shoulder abscess.  Several other locations appear to have spontaneously drained.  Several on L arm that appear to have spontaneously drained/improved.  [place] with heterogenous collection along dorsum of R Stephen Shepard and small collection in the anterior R shoulder S/p right shoulder incision and drainage subcutaneous abscess, R wrist and dorsum of the Stephen Shepard drainage of deep abscess, right Stephen Shepard fourth dorsal compartment tenosynovectomy, right Stephen Shepard third dorsal compartment tenosynovectomy, right Stephen Shepard second dorsal compartment tenosynovectomy on 5/18 Recommending daily packing changes to wound on shoulder and dry dressing on incision For R wrist and forearm, recommending keep splint in place  Per ortho, f/u with Dr. Caralyn Guile later this week or early next  Follow operative cultures -> Stephen Shepard abscess with  strep intermedius, haemophilus parainfluenzae, beta lactamase negative.  Shoulder abscess with strep intermedius. Blood cx growing candida glabrata Repeat cultures 5/23 pending Appreciate ID recs, recommending change to unasyn Suspect related to most likely related to IVDU - close attention to blood cultures given multiple abscesses.   Candidemia ([place]) Candida glabrata from 5/17 susceptibilities pending Repeat culture 5/23 micafungin TTE negative for endocarditis, planning for TEE 5/24  Anxiety Paranoid, anxious at times - his mom died in the hospital within past few years ? Component of withdrawal - see below Repeat utox (opiates, benzo, thc) Will follow for now  Shortness of breath CT PE protocol with mild dependent bibasilar airspace opacities with associated volume loss favored to reflect atelectasis, small volume aspiration is Stephen Shepard consideration follow  Unsteady gait when walking I think he's gradually improving, mental status improved as well. Will hold off on MRI brain  Back pain C/o thoracic back pain Plain films unremarkable CT t spine without evidence of fracture, malalignment, or destructive process within thoracic spine MRI T/L spine without evidence of infection No LE weakness or saddle anesthesia Improved with lidocaine patch   IVDU (intravenous drug user) Reports last heroin use Leonardo Plaia month ago -negative HIV -follow RPR, treponemal ab -follow acute hepatitis panel -> positive hep C ab Started on suboxone 5/20.  Will need to plan for discharge, taper vs follow outpatient with prescriber.    Abdominal discomfort resolved  AKI (acute kidney injury) (Stephen Shepard) Resolved, follow  Hepatitis C infection Follow outpatient  Tobacco abuse Nicotine patch  Chest pain negative troponin CT without PE Will  ctm   Prediabetes noted      DVT prophylaxis: lovenox Code Status: full Family Communication: sig other 5/23 Disposition:   Status is: Inpatient Remains  inpatient appropriate because: need for IV abx, Stephen Shepard surgery clearnace   Consultants:  ortho  Procedures:   Right shoulder incision and drainage subcutaneous abscess Right wrist and dorsum of the Stephen Shepard drainage of deep abscess Right Stephen Shepard fourth dorsal compartment tenosynovectomy Right Stephen Shepard third dorsal compartment tenosynovectomy Right Stephen Shepard second dorsal compartment tenosynovectomy   Echo IMPRESSIONS     1. Left ventricular ejection fraction, by estimation, is 55 to 60%. The  left ventricle has normal function. The left ventricle has no regional  wall motion abnormalities. Left ventricular diastolic parameters are  consistent with Grade I diastolic  dysfunction (impaired relaxation). The average left ventricular global  longitudinal strain is -16.5 %. The global longitudinal strain is  abnormal.   2. Right ventricular systolic function is normal. The right ventricular  size is normal.   3. The mitral valve is normal in structure. Trivial mitral valve  regurgitation.   4. The aortic valve is tricuspid. Aortic valve regurgitation is not  visualized. No aortic stenosis is present.   5. The inferior vena cava is dilated in size with <50% respiratory  variability, suggesting right atrial pressure of 15 mmHg.   Comparison(s): No prior Echocardiogram.   Conclusion(s)/Recommendation(s): No evidence of valvular vegetations on  this transthoracic echocardiogram. Consider Hannalee Castor transesophageal  echocardiogram to exclude infective endocarditis if clinically indicated.  Antimicrobials:  Anti-infectives (From admission, onward)    Start     Dose/Rate Route Frequency Ordered Stop   12/19/21 2200  Ampicillin-Sulbactam (UNASYN) 3 g in sodium chloride 0.9 % 100 mL IVPB        3 g 200 mL/hr over 30 Minutes Intravenous Every 6 hours 12/19/21 1256     12/19/21 2200  metroNIDAZOLE (FLAGYL) tablet 500 mg  Status:  Discontinued        500 mg Oral Every 12 hours 12/19/21 1240 12/19/21 1256    12/18/21 1800  micafungin (MYCAMINE) 100 mg in sodium chloride 0.9 % 100 mL IVPB        100 mg 110 mL/hr over 1 Hours Intravenous Daily 12/18/21 1557     12/18/21 1800  metroNIDAZOLE (FLAGYL) tablet 500 mg  Status:  Discontinued        500 mg Oral Every 8 hours 12/18/21 1559 12/19/21 1240   12/16/21 1000  cefTRIAXone (ROCEPHIN) 1 g in sodium chloride 0.9 % 100 mL IVPB  Status:  Discontinued        1 g 200 mL/hr over 30 Minutes Intravenous Every 24 hours 12/16/21 0817 12/19/21 1256   12/15/21 2000  vancomycin (VANCOREADY) IVPB 1750 mg/350 mL  Status:  Discontinued        1,750 mg 175 mL/hr over 120 Minutes Intravenous Every 24 hours 12/14/21 2351 12/15/21 0953   12/15/21 1215  ceFAZolin (ANCEF) IVPB 2g/100 mL premix        2 g 200 mL/hr over 30 Minutes Intravenous On call to O.R. 12/15/21 1206 12/15/21 1635   12/15/21 1000  vancomycin (VANCOREADY) IVPB 1250 mg/250 mL  Status:  Discontinued        1,250 mg 166.7 mL/hr over 90 Minutes Intravenous Every 12 hours 12/15/21 0954 12/18/21 1221   12/14/21 2345  azithromycin (ZITHROMAX) 500 mg in sodium chloride 0.9 % 250 mL IVPB  Status:  Discontinued        500 mg 250 mL/hr   over 60 Minutes Intravenous Every 24 hours 12/14/21 2337 12/16/21 1901   12/14/21 1930  vancomycin (VANCOCIN) IVPB 1000 mg/200 mL premix        1,000 mg 200 mL/hr over 60 Minutes Intravenous  Once 12/14/21 1920 12/14/21 2352       Subjective: He's resistant to TEE  Objective: Vitals:   12/19/21 1541 12/19/21 2229 12/20/21 0500 12/20/21 1359  BP: 121/88 132/86 123/79 107/62  Pulse: (!) 51 (!) 50 (!) 48 60  Resp: 16 16 14    Temp: 97.8 F (36.6 C) 98.9 F (37.2 C) 99.6 F (37.6 C) 99.5 F (37.5 C)  TempSrc: Oral  Oral Oral  SpO2: 98% 99% 99% 99%  Weight:      Height:        Intake/Output Summary (Last 24 hours) at 12/20/2021 1911 Last data filed at 12/20/2021 1700 Gross per 24 hour  Intake 560 ml  Output 300 ml  Net 260 ml   Filed Weights   12/14/21 1622  12/15/21 1207  Weight: 74.8 kg 64.3 kg    Examination:  General: No acute distress. Cardiovascular: RRR Lungs: unlabored Abdomen: Soft, nontender, nondistended  Neurological: Alert and oriented 3. Moves all extremities 4. Cranial nerves II through XII grossly intact. Extremities: R arm splint    Data Reviewed: I have personally reviewed following labs and imaging studies  CBC: Recent Labs  Lab 12/14/21 1640 12/15/21 0424 12/17/21 0548 12/20/21 1009  WBC 15.7* 15.0* 11.4* 8.5  NEUTROABS 12.3*  --  8.9* 5.3  HGB 13.8 12.7* 13.4 12.6*  HCT 42.5 38.1* 39.1 37.4*  MCV 87.1 85.0 82.8 85.6  PLT 435* 369 392 440*    Basic Metabolic Panel: Recent Labs  Lab 12/14/21 1640 12/15/21 0424 12/16/21 0731 12/17/21 0548 12/18/21 0613 12/20/21 1009  NA 139 139  --  139 140 137  K 3.8 4.1  --  4.0 3.8 4.3  CL 99 103  --  102 104 99  CO2 30 29  --  28 27 30   GLUCOSE 111* 114*  --  118* 123* 115*  BUN 21* 15  --  13 20 15   CREATININE 1.61* 1.00 1.08 0.96 0.79 1.10  CALCIUM 10.1 9.2  --  9.5 9.0 9.3  MG  --   --   --  2.0 2.1 2.2  PHOS  --   --   --  3.3 2.9 3.7    GFR: Estimated Creatinine Clearance: 87.7 mL/min (by C-G formula based on SCr of 1.1 mg/dL).  Liver Function Tests: Recent Labs  Lab 12/17/21 0548 12/18/21 0613 12/20/21 1009  AST 17 28 24   ALT 15 26 23   ALKPHOS 60 78 50  BILITOT 0.6 0.9 0.5  PROT 8.4* 7.0 8.0  ALBUMIN 3.3* 3.2* 3.3*    CBG: No results for input(s): GLUCAP in the last 168 hours.   Recent Results (from the past 240 hour(s))  Blood culture (routine x 2)     Status: Abnormal (Preliminary result)   Collection Time: 12/14/21  5:25 PM   Specimen: BLOOD  Result Value Ref Range Status   Specimen Description   Final    BLOOD LEFT ANTECUBITAL Performed at [facility] [address] [place], [place] [address]    Special Requests   Final    BOTTLES DRAWN AEROBIC AND ANAEROBIC Blood Culture adequate volume Performed  at [facility] [address], [place], [place] [address]    Culture  Setup Time   Final  YEAST AEROBIC BOTTLE ONLY CRITICAL RESULT CALLED TO, READ BACK BY AND VERIFIED WITH: PHARMD J.LEGGE AT [id number] ON 12/18/2021 BY T.SAAD. Performed at [facility], [place] [address] [place], [place] [address]    Culture CANDIDA GLABRATA (Tiffeny Minchew)  Final   Report Status PENDING  Incomplete  Blood Culture ID Panel (Reflexed)     Status: Abnormal   Collection Time: 12/14/21  5:25 PM  Result Value Ref Range Status   Enterococcus faecalis NOT DETECTED NOT DETECTED Final   Enterococcus Faecium NOT DETECTED NOT DETECTED Final   Listeria monocytogenes NOT DETECTED NOT DETECTED Final   Staphylococcus species NOT DETECTED NOT DETECTED Final   Staphylococcus aureus (BCID) NOT DETECTED NOT DETECTED Final   Staphylococcus epidermidis NOT DETECTED NOT DETECTED Final   Staphylococcus lugdunensis NOT DETECTED NOT DETECTED Final   Streptococcus species NOT DETECTED NOT DETECTED Final   Streptococcus agalactiae NOT DETECTED NOT DETECTED Final   Streptococcus pneumoniae NOT DETECTED NOT DETECTED Final   Streptococcus pyogenes NOT DETECTED NOT DETECTED Final   Haeley Fordham.calcoaceticus-baumannii NOT DETECTED NOT DETECTED Final   Bacteroides fragilis NOT DETECTED NOT DETECTED Final   Enterobacterales NOT DETECTED NOT DETECTED Final   Enterobacter cloacae complex NOT DETECTED NOT DETECTED Final   Escherichia coli NOT DETECTED NOT DETECTED Final   Klebsiella aerogenes NOT DETECTED NOT DETECTED Final   Klebsiella oxytoca NOT DETECTED NOT DETECTED Final   Klebsiella pneumoniae NOT DETECTED NOT DETECTED Final   Proteus species NOT DETECTED NOT DETECTED Final   Salmonella species NOT DETECTED NOT DETECTED Final   Serratia marcescens NOT DETECTED NOT DETECTED Final   Haemophilus influenzae NOT DETECTED NOT DETECTED Final   Neisseria meningitidis NOT DETECTED NOT DETECTED Final   Pseudomonas aeruginosa NOT  DETECTED NOT DETECTED Final   Stenotrophomonas maltophilia NOT DETECTED NOT DETECTED Final   Candida albicans NOT DETECTED NOT DETECTED Final   Candida auris NOT DETECTED NOT DETECTED Final   Candida glabrata DETECTED (Shelvy Perazzo) NOT DETECTED Final    Comment: CRITICAL RESULT CALLED TO, READ BACK BY AND VERIFIED WITH: PHARMD J.LEGGE AT 1721 ON 12/18/2021 BY T.SAAD.    Candida krusei NOT DETECTED NOT DETECTED Final   Candida parapsilosis NOT DETECTED NOT DETECTED Final   Candida tropicalis NOT DETECTED NOT DETECTED Final   Cryptococcus neoformans/gattii NOT DETECTED NOT DETECTED Final    Comment: Performed at [facility], [place] [address], [place], [place] [address]  Blood culture (routine x 2)     Status: None   Collection Time: 12/14/21 10:30 PM   Specimen: BLOOD  Result Value Ref Range Status   Specimen Description   Final    BLOOD LEFT ARM Performed at [facility] [address], [place], [place] [address]    Special Requests   Final    BOTTLES DRAWN AEROBIC AND ANAEROBIC Blood Culture adequate volume Performed at [facility] [address], [place], [place] [address]    Culture   Final    NO GROWTH 5 DAYS Performed at [facility], [place] [address] [place], [place] [address]    Report Status 12/20/2021 FINAL  Final  Resp Panel by RT-PCR (Flu Lavora Brisbon&B, Covid)     Status: None   Collection Time: 12/14/21 10:30 PM   Specimen: Nasopharyngeal(NP) swabs in vial transport medium  Result Value Ref Range Status   SARS Coronavirus 2 by RT PCR NEGATIVE NEGATIVE Final    Comment: (NOTE) SARS-CoV-2 target nucleic acids are NOT DETECTED.  The  SARS-CoV-2 RNA is generally detectable in upper respiratory specimens during the acute phase of infection. The lowest concentration of SARS-CoV-2 viral copies this assay can detect is 138 copies/mL. Calise Dunckel negative result does not preclude SARS-Cov-2 infection and should not be used as the sole basis for  treatment or other patient management decisions. Marissia Blackham negative result may occur with  improper specimen collection/handling, submission of specimen other than nasopharyngeal swab, presence of viral mutation(s) within the areas targeted by this assay, and inadequate number of viral copies(<138 copies/mL). Marella Vanderpol negative result must be combined with clinical observations, patient history, and epidemiological information. The expected result is Negative.  Fact Sheet for Patients:  EntrepreneurPulse.com.au  Fact Sheet for Healthcare Providers:  IncredibleEmployment.be  This test is no t yet approved or cleared by the [place] FDA and  has been authorized for detection and/or diagnosis of SARS-CoV-2 by FDA under an Emergency Use Authorization (EUA). This EUA will remain  in effect (meaning this test can be used) for the duration of the COVID-19 declaration under Section 564(b)(1) of the Act, 21 U.S.C.section 360bbb-3(b)(1), unless the authorization is terminated  or revoked sooner.       Influenza Brice Kossman by PCR NEGATIVE NEGATIVE Final   Influenza B by PCR NEGATIVE NEGATIVE Final    Comment: (NOTE) The Xpert Xpress SARS-CoV-2/FLU/RSV plus assay is intended as an aid in the diagnosis of influenza from Nasopharyngeal swab specimens and should not be used as Romon Devereux sole basis for treatment. Nasal washings and aspirates are unacceptable for Xpert Xpress SARS-CoV-2/FLU/RSV testing.  Fact Sheet for Patients: EntrepreneurPulse.com.au  Fact Sheet for Healthcare Providers: IncredibleEmployment.be  This test is not yet approved or cleared by the [place] FDA and has been authorized for detection and/or diagnosis of SARS-CoV-2 by FDA under an Emergency Use Authorization (EUA). This EUA will remain in effect (meaning this test can be used) for the duration of the COVID-19 declaration under Section 564(b)(1) of the Act, 21  U.S.C. section 360bbb-3(b)(1), unless the authorization is terminated or revoked.  Performed at [facility], [place] [address] [place], [place], [place] [address]   Surgical PCR screen     Status: None   Collection Time: 12/15/21 12:58 PM   Specimen: Nasal Mucosa; Nasal Swab  Result Value Ref Range Status   MRSA, PCR NEGATIVE NEGATIVE Final   Staphylococcus aureus NEGATIVE NEGATIVE Final    Comment: (NOTE) The Xpert SA Assay (FDA approved for NASAL specimens in patients 57 years of age and older), is one component of Brylei Pedley comprehensive surveillance program. It is not intended to diagnose infection nor to guide or monitor treatment. Performed at [facility], [place] [address] [place], [place] [address]   Aerobic/Anaerobic Culture w Gram Stain (surgical/deep wound)     Status: None   Collection Time: 12/15/21  5:06 PM   Specimen: PATH Other; Tissue  Result Value Ref Range Status   Specimen Description   Final    TISSUE Performed at [facility] [address] [place], [place] [address]    Special Requests   Final    NONE SHOULDER ABSCESS Performed at [facility], [place] [address], [place], [place] [address]    Gram Stain   Final    ABUNDANT WBC PRESENT, PREDOMINANTLY PMN FEW GRAM POSITIVE COCCI    Culture   Final    ABUNDANT STREPTOCOCCUS INTERMEDIUS NO ANAEROBES ISOLATED Performed at [facility], [place] [address], [place], [place] [address]    Report  Status 12/20/2021 FINAL  Final   Organism ID, Bacteria STREPTOCOCCUS INTERMEDIUS  Final      Susceptibility   Streptococcus intermedius - MIC*    PENICILLIN <=0.06 SENSITIVE Sensitive     CEFTRIAXONE <=0.12 SENSITIVE Sensitive     ERYTHROMYCIN 4 RESISTANT Resistant     LEVOFLOXACIN <=0.25 SENSITIVE Sensitive     VANCOMYCIN 0.25 SENSITIVE Sensitive     * ABUNDANT STREPTOCOCCUS INTERMEDIUS  Aerobic/Anaerobic Culture w Gram Stain (surgical/deep wound)      Status: None   Collection Time: 12/15/21  5:06 PM   Specimen: PATH Other; Tissue  Result Value Ref Range Status   Specimen Description   Final    TISSUE Performed at [facility] [address], [place], [place] [address]    Special Requests   Final    NONE Stephen Shepard ABSCESS Performed at [facility], [place] [address], [place], [place] [address]    Gram Stain   Final    ABUNDANT WBC PRESENT, PREDOMINANTLY PMN FEW GRAM POSITIVE COCCI RARE GRAM NEGATIVE RODS    Culture   Final    ABUNDANT STREPTOCOCCUS INTERMEDIUS FEW HAEMOPHILUS PARAINFLUENZAE BETA LACTAMASE NEGATIVE ABUNDANT PREVOTELLA DENTICOLA BETA LACTAMASE POSITIVE Performed at [facility], [address] [place], [place] [address]    Report Status 12/18/2021 FINAL  Final   Organism ID, Bacteria STREPTOCOCCUS INTERMEDIUS  Final      Susceptibility   Streptococcus intermedius - MIC*    PENICILLIN <=0.06 SENSITIVE Sensitive     CEFTRIAXONE <=0.12 SENSITIVE Sensitive     ERYTHROMYCIN 4 RESISTANT Resistant     LEVOFLOXACIN <=0.25 SENSITIVE Sensitive     VANCOMYCIN 0.25 SENSITIVE Sensitive     * ABUNDANT STREPTOCOCCUS INTERMEDIUS  Urine Culture     Status: None   Collection Time: 12/17/21  8:07 AM   Specimen: Urine, Clean Catch  Result Value Ref Range Status   Specimen Description   Final    URINE, CLEAN CATCH Performed at [facility] [place] [facility], [place] [address], [place], [place] [address]    Special Requests   Final    NONE Performed at [facility], [place] [address], [place], [place] [address]    Culture   Final    NO GROWTH Performed at [facility], [place] [address] [place], [place] [address]    Report Status 12/18/2021 FINAL  Final         Radiology Studies: ECHOCARDIOGRAM COMPLETE  Result Date: 12/19/2021    ECHOCARDIOGRAM REPORT   Patient Name:   [facility] Date of Exam: 12/19/2021 Medical Rec #:  [id number]    Height:       74.0  in Accession #:    [id number]   Weight:       141.8 lb Date of Birth:  11/05/1990    BSA:          1.878 m Patient Age:    30 years     BP:           131/86 mmHg Patient Gender: M            HR:           54 bpm. Exam Location:  Inpatient Procedure: 2D Echo, Cardiac Doppler, Color Doppler and Strain Analysis Indications:    Sepsis  History:        Patient has no prior history of Echocardiogram examinations.  Signs/Symptoms:Shortness of Breath and Chest Pain; Risk                 Factors:Current Smoker.  Sonographer:    Luisa Hart RDCS Referring Phys: [phone] Deva Ron CALDWELL POWELL [place]  1. Left ventricular ejection fraction, by estimation, is 55 to 60%. The left ventricle has normal function. The left ventricle has no regional wall motion abnormalities. Left ventricular diastolic parameters are consistent with Grade I diastolic dysfunction (impaired relaxation). The average left ventricular global longitudinal strain is -16.5 %. The global longitudinal strain is abnormal.  2. Right ventricular systolic function is normal. The right ventricular size is normal.  3. The mitral valve is normal in structure. Trivial mitral valve regurgitation.  4. The aortic valve is tricuspid. Aortic valve regurgitation is not visualized. No aortic stenosis is present.  5. The inferior vena cava is dilated in size with <50% respiratory variability, suggesting right atrial pressure of 15 mmHg. Comparison(s): No prior Echocardiogram. Conclusion(s)/Recommendation(s): No evidence of valvular vegetations on this transthoracic echocardiogram. Consider Fatemah Pourciau transesophageal echocardiogram to exclude infective endocarditis if clinically indicated. FINDINGS  Left Ventricle: Left ventricular ejection fraction, by estimation, is 55 to 60%. The left ventricle has normal function. The left ventricle has no regional wall motion abnormalities. The average left ventricular global longitudinal strain is -16.5 %. The global longitudinal  strain is abnormal. The left ventricular internal cavity size was normal in size. There is no left ventricular hypertrophy. Left ventricular diastolic parameters are consistent with Grade I diastolic dysfunction (impaired relaxation). Right Ventricle: The right ventricular size is normal. No increase in right ventricular wall thickness. Right ventricular systolic function is normal. Left Atrium: Left atrial size was normal in size. Right Atrium: Right atrial size was normal in size. Pericardium: There is no evidence of pericardial effusion. Mitral Valve: The mitral valve is normal in structure. Trivial mitral valve regurgitation. Tricuspid Valve: The tricuspid valve is normal in structure. Tricuspid valve regurgitation is trivial. Aortic Valve: The aortic valve is tricuspid. Aortic valve regurgitation is not visualized. No aortic stenosis is present. Aortic valve mean gradient measures 4.0 mmHg. Aortic valve peak gradient measures 9.0 mmHg. Aortic valve area, by VTI measures 2.76 cm. Pulmonic Valve: The pulmonic valve was not well visualized. Pulmonic valve regurgitation is not visualized. Aorta: The aortic root is normal in size and structure. Venous: The inferior vena cava is dilated in size with less than 50% respiratory variability, suggesting right atrial pressure of 15 mmHg. IAS/Shunts: The atrial septum is grossly normal.  LEFT VENTRICLE PLAX 2D LVIDd:         4.90 cm     Diastology LVIDs:         3.10 cm     LV e' medial:    6.42 cm/s LV PW:         1.00 cm     LV E/e' medial:  6.7 LV IVS:        0.90 cm     LV e' lateral:   5.33 cm/s LVOT diam:     2.20 cm     LV E/e' lateral: 8.0 LV SV:         85 LV SV Index:   45          2D Longitudinal Strain LVOT Area:     3.80 cm    2D Strain GLS Avg:     -16.5 %  LV Volumes (MOD) LV vol d, MOD A2C: 73.4 ml LV vol d, MOD A4C:  91.7 ml LV vol s, MOD A2C: 30.1 ml LV vol s, MOD A4C: 37.5 ml LV SV MOD A2C:     43.3 ml LV SV MOD A4C:     91.7 ml LV SV MOD BP:      52.5  ml RIGHT VENTRICLE RV Basal diam:  4.20 cm RV Mid diam:    3.30 cm RV S prime:     17.60 cm/s TAPSE (M-mode): 3.0 cm LEFT ATRIUM             Index        RIGHT ATRIUM           Index Stephen diam:        2.70 cm 1.44 cm/m   RA Area:     17.70 cm Stephen Vol (A2C):   26.4 ml 14.06 ml/m  RA Volume:   50.90 ml  27.11 ml/m Stephen Vol (A4C):   24.3 ml 12.94 ml/m Stephen Biplane Vol: 25.3 ml 13.47 ml/m  AORTIC VALVE                    PULMONIC VALVE AV Area (Vmax):    3.12 cm     PV Vmax:       0.76 m/s AV Area (Vmean):   3.09 cm     PV Vmean:      50.400 cm/s AV Area (VTI):     2.76 cm     PV VTI:        0.159 m AV Vmax:           150.00 cm/s  PV Peak grad:  2.3 mmHg AV Vmean:          94.700 cm/s  PV Mean grad:  1.0 mmHg AV VTI:            0.309 m AV Peak Grad:      9.0 mmHg AV Mean Grad:      4.0 mmHg LVOT Vmax:         123.00 cm/s LVOT Vmean:        77.100 cm/s LVOT VTI:          0.224 m LVOT/AV VTI ratio: 0.72  AORTA Ao Asc diam: 2.50 cm MITRAL VALVE               TRICUSPID VALVE MV Area (PHT): 2.76 cm    TR Peak grad:   23.8 mmHg MV Decel Time: 275 msec    TR Vmax:        244.00 cm/s MV E velocity: 42.70 cm/s MV Victorio Creeden velocity: 42.70 cm/s  SHUNTS MV E/Daesha Insco ratio:  1.00        Systemic VTI:  0.22 m                            Systemic Diam: 2.20 cm Gwyndolyn Kaufman MD Electronically signed by Gwyndolyn Kaufman MD Signature Date/Time: 12/19/2021/3:49:36 PM    Final      Scheduled Meds:  buprenorphine-naloxone  1 tablet Sublingual BID   enoxaparin (LOVENOX) injection  40 mg Subcutaneous Q24H   lidocaine  1 patch Transdermal Q24H   mouth rinse  15 mL Mouth Rinse BID   nicotine  21 mg Transdermal Daily   polyethylene glycol  17 g Oral Daily   [START ON 12/23/2021] thiamine  100 mg Oral Daily   Continuous Infusions:  ampicillin-sulbactam (UNASYN) IV 3 g (12/20/21 1555)   micafungin (MYCAMINE) IV 100  mg (12/20/21 1233)   thiamine injection 500 mg (12/20/21 1430)     LOS: 6 days    Time spent: over 30 min    Fayrene Helper, MD [facility]   To contact the attending provider between 7A-7P or the covering provider during after hours 7P-7A, please log into the web site [email or web address] and access using universal [place] password for that web site. If you do not have the password, please call the hospital operator.  12/20/2021, 7:11 PM

## 2021-12-20 NOTE — Progress Notes (Signed)
Physical Therapy Treatment Patient Details Name: Stephen Shepard MRN: [id number] DOB: [birth year] Today's Date: 12/20/2021   History of Present Illness 31 y.o. male with medical history significant of seizure not on antiepileptics and history of IV heroin drug use who presents with concerns of worsening abscess.    PT Comments    Pt declines use of RW, unsure if painful or difficult to grip with RUE. Pt ambulates with min A+2 for safety, demonstrate drifty gait, varying step length and varying foot width positioning, easily distracted increasing risk for falls, requires assist for 2 LOB to prevent fall. Pt with poor insight to deficits, declines RW use and guarded carryover of cues with gait. Cued for hand placement and body mechanics with rising to stand and return to sit. Pt easily distracted, impulsive and restless during session, reports high motivation to mobilize, but attempts to mobilize prior to IV and condom catheter lines being in appropriate positions. Plan to continue to progress as able.    Recommendations for follow up therapy are one component of a multi-disciplinary discharge planning process, led by the attending physician.  Recommendations may be updated based on patient status, additional functional criteria and insurance authorization.  Follow Up Recommendations  No PT follow up     Assistance Recommended at Discharge Frequent or constant Supervision/Assistance  Patient can return home with the following A little help with walking and/or transfers;A little help with bathing/dressing/bathroom;Assist for transportation;Assistance with cooking/housework;Help with stairs or ramp for entrance   Equipment Recommendations  Other (comment) (continuing to assess)    Recommendations for Other Services       Precautions / Restrictions Precautions Precautions: Fall Precaution Comments: R shoulder dressing s/p I & D Required Braces or Orthoses: Splint/Cast Splint/Cast: R  forearm Restrictions Weight Bearing Restrictions: No     Mobility  Bed Mobility Overal bed mobility: Needs Assistance Bed Mobility: Supine to Sit  Supine to sit: Supervision  General bed mobility comments: increased time, decreased use of RUE    Transfers Overall transfer level: Needs assistance Equipment used: Rolling walker (2 wheels) Transfers: Sit to/from Stand Sit to Stand: Min assist  General transfer comment: posterior lean with heavy weight through heels and toes elevated off of floor, BLE braced against bed, min A to power up and steady, VC for positioning and hand placement with return to sitting to improve safety    Ambulation/Gait Ambulation/Gait assistance: Min assist, +2 safety/equipment Gait Distance (Feet): 175 Feet (x2 with standing rest break between) Assistive device: None Gait Pattern/deviations: Decreased stride length, Drifts right/left Gait velocity: decreased  General Gait Details: pt very unsteady, ataxic-like at times, drifting R and L, varying step length and width positioning, occasional scissoring, easily distracted requiring VC for attention to task and foot placement, 2 LOB requiring assist to recover, pt declines use of RW displaying poor insight to deficits   Stairs             Wheelchair Mobility    Modified Rankin (Stroke Patients Only)       Balance Overall balance assessment: Needs assistance  Sitting balance-Leahy Scale: Good  Standing balance support: Bilateral upper extremity supported, During functional activity Standing balance-Leahy Scale: Poor     Cognition Arousal/Alertness: Awake/alert Behavior During Therapy: Restless, Impulsive Overall Cognitive Status: No family/caregiver present to determine baseline cognitive functioning Area of Impairment: Attention, Problem solving  Problem Solving: Requires verbal cues General Comments: VC for attention to task as pt easily distracted increasing risk for falls, very  motivated to the  point of restless and slightly impulsive, follows commands appropriately        Exercises      General Comments        Pertinent Vitals/Pain Pain Assessment Pain Assessment: Faces Faces Pain Scale: Hurts little more Pain Location: RUE Pain Descriptors / Indicators: Grimacing, Guarding Pain Intervention(s): Limited activity within patient's tolerance, Monitored during session, Premedicated before session    Home Living                          Prior Function            PT Goals (current goals can now be found in the care plan section) Acute Rehab PT Goals Patient Stated Goal: none stated PT Goal Formulation: With patient/family Time For Goal Achievement: 01/01/22 Potential to Achieve Goals: Good Progress towards PT goals: Progressing toward goals    Frequency    Min 3X/week      PT Plan Current plan remains appropriate    Co-evaluation              AM-PAC PT "6 Clicks" Mobility   Outcome Measure  Help needed turning from your back to your side while in a flat bed without using bedrails?: A Little Help needed moving from lying on your back to sitting on the side of a flat bed without using bedrails?: A Little Help needed moving to and from a bed to a chair (including a wheelchair)?: A Little Help needed standing up from a chair using your arms (e.g., wheelchair or bedside chair)?: A Little Help needed to walk in hospital room?: A Lot Help needed climbing 3-5 steps with a railing? : A Lot 6 Click Score: 16    End of Session Equipment Utilized During Treatment: Gait belt Activity Tolerance: Patient tolerated treatment well Patient left: in chair;with call bell/phone within reach;with nursing/sitter in room Nurse Communication: Mobility status PT Visit Diagnosis: Pain;Muscle weakness (generalized) (M62.81);Unsteadiness on feet (R26.81) Pain - Right/Left: Right Pain - part of body: Shoulder;Arm     Time: 9983-3825 PT Time  Calculation (min) (ACUTE ONLY): 24 min  Charges:  $Gait Training: 8-22 mins $Therapeutic Activity: 8-22 mins                      Tori Lanora Reveron PT, DPT 12/20/21, 1:45 PM

## 2021-12-20 NOTE — Progress Notes (Addendum)
    [facility] has been requested to perform a transesophageal echocardiogram on [facility] for bacteremia.  After careful review of history and examination, the risks and benefits of transesophageal echocardiogram have been explained including risks of esophageal damage, perforation (1:10,000 risk), bleeding, pharyngeal hematoma as well as other potential complications associated with conscious sedation including aspiration, arrhythmia, respiratory failure and death. Alternatives to treatment were discussed, questions were answered. Patient is NOT willing to proceed.  He said it was too much.   Addendum at 1512 today pt has now agreed to procedure after further discussion with ID  Nada Boozer, NP  12/20/2021 9:40 AM

## 2021-12-20 NOTE — Progress Notes (Signed)
[facility]  Date of Admission:  12/14/2021     Abx: 5/21-c micafungin 5/21-c amp-sulbactam  5/21 metronidazole 5/19-21 ceftriaxone 5/17-21 vanc                  Acute hepatitis panel positive for hepatitis c with viral load 1.2 million units   Assessment: 31 yo male with seizure/epilepsi, ivdu admitted initially 5/17 with multiple skin abscesses, found to have c glabrata bsi. Of note recently received amox-clav for sinusitis   He is s/p I&D of right shoulder, right wrist/hand dorsum abscesses and tenosynovectomy of right hand 2nd, 3rd, and 4th dorsal compartments on 5/18   I suspect this is due to skin popping/ivdu, not embolic phenomenom but some concern arises for endocarditis given higher risk and also candida glabrata bsi and recent abx use with augmentin suppressing bcx   5/17 bcx candida glabrata; resulted 5/21 5/18 operative cx strep intermedius, haemophilus parainfluenzae (betalactamase negative), prevotella denticola (beta lactamase positive) 5/21 repeat bcx pending   Echo ordered    Back pain but rather unconcerning mri thoracolumbar area     Other issues: Chronic hep c can be further addressed in ID clinic or gi clinic outpatient Would check syphilis as well Hiv and hep b negative  ---------- 5/23 id assessment Initially refused tee but then I explain to him again we want to get the diagnosis of IE right especially with invasive fungal infection in terms of potential complication from delayed treatment and antimicrobial combinations required to treat candida IE  He asks about mri brain. I discussed with Dr Florene Glen, due to relatively stable mentation which appear at baseline, we can d/c mri given he is already reluctant to getting treatment/procedures/labs     Plan: Continue micafungin; await c-glabrata susceptibility Continue amp-sulbactam TEE to be scheduled for tomorrow Please see if we could get repeat blood cultures  to make sure candida fungemia clears Discussed with primary team   I spent more than 50 minute reviewing data/chart, and coordinating care and >50% direct face to face time providing counseling/discussing diagnostics/treatment plan with patient     Principal Problem:   Abscess of multiple sites Active Problems:   IVDU (intravenous drug user)   Sepsis ([place])   AKI (acute kidney injury) ([place])   Shortness of breath   Back pain   Hepatitis C infection   Chest pain   Prediabetes   Anxiety   Abdominal discomfort   Tobacco abuse   Chronic hepatitis C without hepatic coma (HCC)   Candidemia (HCC)   Unsteady gait when walking   Allergies  Allergen Reactions   Ibuprofen Anaphylaxis   Other Anaphylaxis    Kepra   Wasp Venom Protein Swelling    hives    Scheduled Meds:  buprenorphine-naloxone  1 tablet Sublingual BID   enoxaparin (LOVENOX) injection  40 mg Subcutaneous Q24H   lidocaine  1 patch Transdermal Q24H   mouth rinse  15 mL Mouth Rinse BID   nicotine  21 mg Transdermal Daily   polyethylene glycol  17 g Oral Daily   [START ON 12/23/2021] thiamine  100 mg Oral Daily   Continuous Infusions:  ampicillin-sulbactam (UNASYN) IV 3 g (12/20/21 1035)   micafungin (MYCAMINE) IV 100 mg (12/20/21 1233)   thiamine injection 500 mg (12/20/21 1430)   PRN Meds:.[EXPIRED] acetaminophen **FOLLOWED BY** acetaminophen, HYDROmorphone (DILAUDID) injection, hydrOXYzine, naLOXone (NARCAN)  injection, nicotine polacrilex, oxyCODONE   SUBJECTIVE: No headache or confusion Just  upset with all the procedures that he was told he needs  Refusing blood cx repeat so far Afebrile Right hand/shoulder seems to respond to abx and recent I&D No other complaint  Review of Systems: ROS All other ROS was negative, except mentioned above     OBJECTIVE: Vitals:   12/19/21 1541 12/19/21 2229 12/20/21 0500 12/20/21 1359  BP: 121/88 132/86 123/79 107/62  Pulse: (!) 51 (!) 50 (!) 48 60  Resp: 16  16 14    Temp: 97.8 F (36.6 C) 98.9 F (37.2 C) 99.6 F (37.6 C) 99.5 F (37.5 C)  TempSrc: Oral  Oral Oral  SpO2: 98% 99% 99% 99%  Weight:      Height:       Body mass index is 18.2 kg/m.  Physical Exam  General/constitutional: sitting in chair. Conversant. Appears to understand our discussion about tee  HEENT: Normocephalic, PER, Conj Clear, EOMI, Oropharynx clear Neck supple CV: rrr no mrg Lungs: clear to auscultation, normal respiratory effort Abd: Soft, Nontender Ext: no edema Skin/msk: right wrist in splint/dressing that is clean/dry; right shoulder dressing clean/dry Neuro: nonfocal    Lab Results Lab Results  Component Value Date   WBC 8.5 12/20/2021   HGB 12.6 (L) 12/20/2021   HCT 37.4 (L) 12/20/2021   MCV 85.6 12/20/2021   PLT 440 (H) 12/20/2021    Lab Results  Component Value Date   CREATININE 1.10 12/20/2021   BUN 15 12/20/2021   NA 137 12/20/2021   K 4.3 12/20/2021   CL 99 12/20/2021   CO2 30 12/20/2021    Lab Results  Component Value Date   ALT 23 12/20/2021   AST 24 12/20/2021   ALKPHOS 50 12/20/2021   BILITOT 0.5 12/20/2021      Microbiology: Recent Results (from the past 240 hour(s))  Blood culture (routine x 2)     Status: Abnormal (Preliminary result)   Collection Time: 12/14/21  5:25 PM   Specimen: BLOOD  Result Value Ref Range Status   Specimen Description   Final    BLOOD LEFT ANTECUBITAL Performed at [facility], [place] [address], [place], [place] [address]    Special Requests   Final    BOTTLES DRAWN AEROBIC AND ANAEROBIC Blood Culture adequate volume Performed at [facility] [address] [place], [place] [address]    Culture  Setup Time   Final    YEAST AEROBIC BOTTLE ONLY CRITICAL RESULT CALLED TO, READ BACK BY AND VERIFIED WITH: PHARMD J.LEGGE AT [id number] ON 12/18/2021 BY T.SAAD. Performed at [facility], [place] [address], [place], [place] [address]    Culture CANDIDA  GLABRATA (A)  Final   Report Status PENDING  Incomplete  Blood Culture ID Panel (Reflexed)     Status: Abnormal   Collection Time: 12/14/21  5:25 PM  Result Value Ref Range Status   Enterococcus faecalis NOT DETECTED NOT DETECTED Final   Enterococcus Faecium NOT DETECTED NOT DETECTED Final   Listeria monocytogenes NOT DETECTED NOT DETECTED Final   Staphylococcus species NOT DETECTED NOT DETECTED Final   Staphylococcus aureus (BCID) NOT DETECTED NOT DETECTED Final   Staphylococcus epidermidis NOT DETECTED NOT DETECTED Final   Staphylococcus lugdunensis NOT DETECTED NOT DETECTED Final   Streptococcus species NOT DETECTED NOT DETECTED Final   Streptococcus agalactiae NOT DETECTED NOT DETECTED Final   Streptococcus pneumoniae NOT DETECTED NOT DETECTED Final   Streptococcus pyogenes NOT DETECTED NOT DETECTED Final   A.calcoaceticus-baumannii NOT DETECTED NOT DETECTED  Final   Bacteroides fragilis NOT DETECTED NOT DETECTED Final   Enterobacterales NOT DETECTED NOT DETECTED Final   Enterobacter cloacae complex NOT DETECTED NOT DETECTED Final   Escherichia coli NOT DETECTED NOT DETECTED Final   Klebsiella aerogenes NOT DETECTED NOT DETECTED Final   Klebsiella oxytoca NOT DETECTED NOT DETECTED Final   Klebsiella pneumoniae NOT DETECTED NOT DETECTED Final   Proteus species NOT DETECTED NOT DETECTED Final   Salmonella species NOT DETECTED NOT DETECTED Final   Serratia marcescens NOT DETECTED NOT DETECTED Final   Haemophilus influenzae NOT DETECTED NOT DETECTED Final   Neisseria meningitidis NOT DETECTED NOT DETECTED Final   Pseudomonas aeruginosa NOT DETECTED NOT DETECTED Final   Stenotrophomonas maltophilia NOT DETECTED NOT DETECTED Final   Candida albicans NOT DETECTED NOT DETECTED Final   Candida auris NOT DETECTED NOT DETECTED Final   Candida glabrata DETECTED (A) NOT DETECTED Final    Comment: CRITICAL RESULT CALLED TO, READ BACK BY AND VERIFIED WITH: PHARMD J.LEGGE AT 1721 ON 12/18/2021  BY T.SAAD.    Candida krusei NOT DETECTED NOT DETECTED Final   Candida parapsilosis NOT DETECTED NOT DETECTED Final   Candida tropicalis NOT DETECTED NOT DETECTED Final   Cryptococcus neoformans/gattii NOT DETECTED NOT DETECTED Final    Comment: Performed at [facility], [place] [address], [place], [place] [address]  Blood culture (routine x 2)     Status: None   Collection Time: 12/14/21 10:30 PM   Specimen: BLOOD  Result Value Ref Range Status   Specimen Description   Final    BLOOD LEFT ARM Performed at [facility] [address] [place], [place] [address]    Special Requests   Final    BOTTLES DRAWN AEROBIC AND ANAEROBIC Blood Culture adequate volume Performed at [facility] [address], [place], [place] [address]    Culture   Final    NO GROWTH 5 DAYS Performed at [facility], [place] [address], [place], [place] [address]    Report Status 12/20/2021 FINAL  Final  Resp Panel by RT-PCR (Flu A&B, Covid)     Status: None   Collection Time: 12/14/21 10:30 PM   Specimen: Nasopharyngeal(NP) swabs in vial transport medium  Result Value Ref Range Status   SARS Coronavirus 2 by RT PCR NEGATIVE NEGATIVE Final    Comment: (NOTE) SARS-CoV-2 target nucleic acids are NOT DETECTED.  The SARS-CoV-2 RNA is generally detectable in upper respiratory specimens during the acute phase of infection. The lowest concentration of SARS-CoV-2 viral copies this assay can detect is 138 copies/mL. A negative result does not preclude SARS-Cov-2 infection and should not be used as the sole basis for treatment or other patient management decisions. A negative result may occur with  improper specimen collection/handling, submission of specimen other than nasopharyngeal swab, presence of viral mutation(s) within the areas targeted by this assay, and inadequate number of viral copies(<138 copies/mL). A negative result must be combined with clinical  observations, patient history, and epidemiological information. The expected result is Negative.  Fact Sheet for Patients:  EntrepreneurPulse.com.au  Fact Sheet for Healthcare Providers:  IncredibleEmployment.be  This test is no t yet approved or cleared by the [place] FDA and  has been authorized for detection and/or diagnosis of SARS-CoV-2 by FDA under an Emergency Use Authorization (EUA). This EUA will remain  in effect (meaning this test can be used) for the duration of the COVID-19 declaration under Section 564(b)(1) of the Act, 21  U.S.C.section 360bbb-3(b)(1), unless the authorization is terminated  or revoked sooner.       Influenza A by PCR NEGATIVE NEGATIVE Final   Influenza B by PCR NEGATIVE NEGATIVE Final    Comment: (NOTE) The Xpert Xpress SARS-CoV-2/FLU/RSV plus assay is intended as an aid in the diagnosis of influenza from Nasopharyngeal swab specimens and should not be used as a sole basis for treatment. Nasal washings and aspirates are unacceptable for Xpert Xpress SARS-CoV-2/FLU/RSV testing.  Fact Sheet for Patients: EntrepreneurPulse.com.au  Fact Sheet for Healthcare Providers: IncredibleEmployment.be  This test is not yet approved or cleared by the [place] FDA and has been authorized for detection and/or diagnosis of SARS-CoV-2 by FDA under an Emergency Use Authorization (EUA). This EUA will remain in effect (meaning this test can be used) for the duration of the COVID-19 declaration under Section 564(b)(1) of the Act, 21 U.S.C. section 360bbb-3(b)(1), unless the authorization is terminated or revoked.  Performed at [facility], [place] [address], [place], [place] [address]   Surgical PCR screen     Status: None   Collection Time: 12/15/21 12:58 PM   Specimen: Nasal Mucosa; Nasal Swab  Result Value Ref Range Status   MRSA, PCR NEGATIVE NEGATIVE Final    Staphylococcus aureus NEGATIVE NEGATIVE Final    Comment: (NOTE) The Xpert SA Assay (FDA approved for NASAL specimens in patients 26 years of age and older), is one component of a comprehensive surveillance program. It is not intended to diagnose infection nor to guide or monitor treatment. Performed at [facility], [place] [address] [place], [place] [address]   Aerobic/Anaerobic Culture w Gram Stain (surgical/deep wound)     Status: None   Collection Time: 12/15/21  5:06 PM   Specimen: PATH Other; Tissue  Result Value Ref Range Status   Specimen Description   Final    TISSUE Performed at [facility] [address], [place], [place] [address]    Special Requests   Final    NONE SHOULDER ABSCESS Performed at [facility], [place] [address], [place], [place] [address]    Gram Stain   Final    ABUNDANT WBC PRESENT, PREDOMINANTLY PMN FEW GRAM POSITIVE COCCI    Culture   Final    ABUNDANT STREPTOCOCCUS INTERMEDIUS NO ANAEROBES ISOLATED Performed at [facility], [place] [address], [place], [place] [address]    Report Status 12/20/2021 FINAL  Final   Organism ID, Bacteria STREPTOCOCCUS INTERMEDIUS  Final      Susceptibility   Streptococcus intermedius - MIC*    PENICILLIN <=0.06 SENSITIVE Sensitive     CEFTRIAXONE <=0.12 SENSITIVE Sensitive     ERYTHROMYCIN 4 RESISTANT Resistant     LEVOFLOXACIN <=0.25 SENSITIVE Sensitive     VANCOMYCIN 0.25 SENSITIVE Sensitive     * ABUNDANT STREPTOCOCCUS INTERMEDIUS  Aerobic/Anaerobic Culture w Gram Stain (surgical/deep wound)     Status: None   Collection Time: 12/15/21  5:06 PM   Specimen: PATH Other; Tissue  Result Value Ref Range Status   Specimen Description   Final    TISSUE Performed at [facility] [address], [place], [place] [address]    Special Requests   Final    NONE HAND ABSCESS Performed at [facility], [place] [address] [place], [place] [address]    Gram Stain   Final    ABUNDANT WBC PRESENT, PREDOMINANTLY PMN FEW GRAM POSITIVE COCCI RARE GRAM NEGATIVE RODS  Culture   Final    ABUNDANT STREPTOCOCCUS INTERMEDIUS FEW HAEMOPHILUS PARAINFLUENZAE BETA LACTAMASE NEGATIVE ABUNDANT PREVOTELLA DENTICOLA BETA LACTAMASE POSITIVE Performed at [facility], [place] [address] [place], [place] [address]    Report Status 12/18/2021 FINAL  Final   Organism ID, Bacteria STREPTOCOCCUS INTERMEDIUS  Final      Susceptibility   Streptococcus intermedius - MIC*    PENICILLIN <=0.06 SENSITIVE Sensitive     CEFTRIAXONE <=0.12 SENSITIVE Sensitive     ERYTHROMYCIN 4 RESISTANT Resistant     LEVOFLOXACIN <=0.25 SENSITIVE Sensitive     VANCOMYCIN 0.25 SENSITIVE Sensitive     * ABUNDANT STREPTOCOCCUS INTERMEDIUS  Urine Culture     Status: None   Collection Time: 12/17/21  8:07 AM   Specimen: Urine, Clean Catch  Result Value Ref Range Status   Specimen Description   Final    URINE, CLEAN CATCH Performed at [facility], [place] [address] [place], [place] [address]    Special Requests   Final    NONE Performed at [facility], [place] [address], [place], [place] [address]    Culture   Final    NO GROWTH Performed at [facility], [place] [address], [place], [place] [address]    Report Status 12/18/2021 FINAL  Final     Serology:   Imaging: If present, new imagings (plain films, ct scans, and mri) have been personally visualized and interpreted; radiology reports have been reviewed. Decision making incorporated into the Impression / Recommendations.   5/18 ct abd/pelv with contrast 1. Mild dependent bibasilar airspace opacities with associated volume loss favored to reflect atelectasis. Small volume aspiration is a consideration in the appropriate clinical context. 2. Otherwise, no acute cardiopulmonary process. 3. No evidence of fracture, malalignment or destructive  process within the thoracic spine.   5/20 mri thoracolumbar spine 1. No evidence of discitis-osteomyelitis or epidural abscess. 2. Small left subarticular disc protrusion at T6-7 without spinal canal stenosis. 3. Small left subarticular disc protrusion at L4-5 mildly narrowing the left lateral recess. Otherwise normal thoracic and lumbar spine.  5/22 tte   1. Left ventricular ejection fraction, by estimation, is 55 to 60%. The  left ventricle has normal function. The left ventricle has no regional  wall motion abnormalities. Left ventricular diastolic parameters are  consistent with Grade I diastolic  dysfunction (impaired relaxation). The average left ventricular global  longitudinal strain is -16.5 %. The global longitudinal strain is  abnormal.   2. Right ventricular systolic function is normal. The right ventricular  size is normal.   3. The mitral valve is normal in structure. Trivial mitral valve  regurgitation.   4. The aortic valve is tricuspid. Aortic valve regurgitation is not  visualized. No aortic stenosis is present.   5. The inferior vena cava is dilated in size with <50% respiratory  variability, suggesting right atrial pressure of 15 mmHg.   Comparison(s): No prior Echocardiogram.   Conclusion(s)/Recommendation(s): No evidence of valvular vegetations on this transthoracic echocardiogram. Consider a transesophageal  echocardiogram to exclude infective endocarditis if clinically indicated.   Jabier Mutton, [facility] [place] [phone] pager    12/20/2021, 3:12 PM

## 2021-12-20 NOTE — H&P (View-Only) (Signed)
PROGRESS NOTE    Stephen Shepard  [id number] DOB: 03-22-1991 DOA: 12/14/2021 PCP: Patient, [name] (Inactive)  Chief Complaint  Patient presents with   Abscess    Brief Narrative:  Lynford Shytle is Muscab Brenneman 31 y.o. male with medical history significant of seizure not on antiepileptics and history of IV heroin drug use who presents with concerns of worsening abscess.  He's now s/p surgery with orthopedics/hand.  Culture directed abx.  Blood cultures with candida glabrata, ID consulted and workup underway.  See below for additional details    Assessment & Plan:   Principal Problem:   Abscess of multiple sites Active Problems:   Candidemia ([place])   Anxiety   Shortness of breath   IVDU (intravenous drug user)   Back pain   Unsteady gait when walking   AKI (acute kidney injury) ([place])   Abdominal discomfort   Hepatitis C infection   Chest pain   Tobacco abuse   Prediabetes   Sepsis ([place])   Chronic hepatitis C without hepatic coma (HCC)   Assessment and Plan: * Abscess of multiple sites Sepsis related to multiple abscesses, tachycardic, leukocytosis at presentation -> ruled in R hand abscess and R shoulder abscess.  Several other locations appear to have spontaneously drained.  Several on L arm that appear to have spontaneously drained/improved.  [place] with heterogenous collection along dorsum of R hand and small collection in the anterior R shoulder S/p right shoulder incision and drainage subcutaneous abscess, R wrist and dorsum of the hand drainage of deep abscess, right hand fourth dorsal compartment tenosynovectomy, right hand third dorsal compartment tenosynovectomy, right hand second dorsal compartment tenosynovectomy on 5/18 Recommending daily packing changes to wound on shoulder and dry dressing on incision For R wrist and forearm, recommending keep splint in place  Per ortho, f/u with Dr. Caralyn Guile later this week or early next  Follow operative cultures -> hand abscess with  strep intermedius, haemophilus parainfluenzae, beta lactamase negative.  Shoulder abscess with strep intermedius. Blood cx growing candida glabrata Repeat cultures 5/23 pending Appreciate ID recs, recommending change to unasyn Suspect related to most likely related to IVDU - close attention to blood cultures given multiple abscesses.   Candidemia ([place]) Candida glabrata from 5/17 susceptibilities pending Repeat culture 5/23 micafungin TTE negative for endocarditis, planning for TEE 5/24  Anxiety Paranoid, anxious at times - his mom died in the hospital within past few years ? Component of withdrawal - see below Repeat utox (opiates, benzo, thc) Will follow for now  Shortness of breath CT PE protocol with mild dependent bibasilar airspace opacities with associated volume loss favored to reflect atelectasis, small volume aspiration is Joyclyn Plazola consideration follow  Unsteady gait when walking I think he's gradually improving, mental status improved as well. Will hold off on MRI brain  Back pain C/o thoracic back pain Plain films unremarkable CT t spine without evidence of fracture, malalignment, or destructive process within thoracic spine MRI T/L spine without evidence of infection No LE weakness or saddle anesthesia Improved with lidocaine patch   IVDU (intravenous drug user) Reports last heroin use Sherley Mckenney month ago -negative HIV -follow RPR, treponemal ab -follow acute hepatitis panel -> positive hep C ab Started on suboxone 5/20.  Will need to plan for discharge, taper vs follow outpatient with prescriber.    Abdominal discomfort resolved  AKI (acute kidney injury) ([place]) Resolved, follow  Hepatitis C infection Follow outpatient  Tobacco abuse Nicotine patch  Chest pain negative troponin CT without PE Will  ctm   Prediabetes noted      DVT prophylaxis: lovenox Code Status: full Family Communication: sig other 5/23 Disposition:   Status is: Inpatient Remains  inpatient appropriate because: need for IV abx, hand surgery clearnace   Consultants:  ortho  Procedures:   Right shoulder incision and drainage subcutaneous abscess Right wrist and dorsum of the hand drainage of deep abscess Right hand fourth dorsal compartment tenosynovectomy Right hand third dorsal compartment tenosynovectomy Right hand second dorsal compartment tenosynovectomy   Echo IMPRESSIONS     1. Left ventricular ejection fraction, by estimation, is 55 to 60%. The  left ventricle has normal function. The left ventricle has no regional  wall motion abnormalities. Left ventricular diastolic parameters are  consistent with Grade I diastolic  dysfunction (impaired relaxation). The average left ventricular global  longitudinal strain is -16.5 %. The global longitudinal strain is  abnormal.   2. Right ventricular systolic function is normal. The right ventricular  size is normal.   3. The mitral valve is normal in structure. Trivial mitral valve  regurgitation.   4. The aortic valve is tricuspid. Aortic valve regurgitation is not  visualized. No aortic stenosis is present.   5. The inferior vena cava is dilated in size with <50% respiratory  variability, suggesting right atrial pressure of 15 mmHg.   Comparison(s): No prior Echocardiogram.   Conclusion(s)/Recommendation(s): No evidence of valvular vegetations on  this transthoracic echocardiogram. Consider Prince Couey transesophageal  echocardiogram to exclude infective endocarditis if clinically indicated.  Antimicrobials:  Anti-infectives (From admission, onward)    Start     Dose/Rate Route Frequency Ordered Stop   12/19/21 2200  Ampicillin-Sulbactam (UNASYN) 3 g in sodium chloride 0.9 % 100 mL IVPB        3 g 200 mL/hr over 30 Minutes Intravenous Every 6 hours 12/19/21 1256     12/19/21 2200  metroNIDAZOLE (FLAGYL) tablet 500 mg  Status:  Discontinued        500 mg Oral Every 12 hours 12/19/21 1240 12/19/21 1256    12/18/21 1800  micafungin (MYCAMINE) 100 mg in sodium chloride 0.9 % 100 mL IVPB        100 mg 110 mL/hr over 1 Hours Intravenous Daily 12/18/21 1557     12/18/21 1800  metroNIDAZOLE (FLAGYL) tablet 500 mg  Status:  Discontinued        500 mg Oral Every 8 hours 12/18/21 1559 12/19/21 1240   12/16/21 1000  cefTRIAXone (ROCEPHIN) 1 g in sodium chloride 0.9 % 100 mL IVPB  Status:  Discontinued        1 g 200 mL/hr over 30 Minutes Intravenous Every 24 hours 12/16/21 0817 12/19/21 1256   12/15/21 2000  vancomycin (VANCOREADY) IVPB 1750 mg/350 mL  Status:  Discontinued        1,750 mg 175 mL/hr over 120 Minutes Intravenous Every 24 hours 12/14/21 2351 12/15/21 0953   12/15/21 1215  ceFAZolin (ANCEF) IVPB 2g/100 mL premix        2 g 200 mL/hr over 30 Minutes Intravenous On call to O.R. 12/15/21 1206 12/15/21 1635   12/15/21 1000  vancomycin (VANCOREADY) IVPB 1250 mg/250 mL  Status:  Discontinued        1,250 mg 166.7 mL/hr over 90 Minutes Intravenous Every 12 hours 12/15/21 0954 12/18/21 1221   12/14/21 2345  azithromycin (ZITHROMAX) 500 mg in sodium chloride 0.9 % 250 mL IVPB  Status:  Discontinued        500 mg 250 mL/hr  over 60 Minutes Intravenous Every 24 hours 12/14/21 2337 12/16/21 1901   12/14/21 1930  vancomycin (VANCOCIN) IVPB 1000 mg/200 mL premix        1,000 mg 200 mL/hr over 60 Minutes Intravenous  Once 12/14/21 1920 12/14/21 2352       Subjective: He's resistant to TEE  Objective: Vitals:   12/19/21 1541 12/19/21 2229 12/20/21 0500 12/20/21 1359  BP: 121/88 132/86 123/79 107/62  Pulse: (!) 51 (!) 50 (!) 48 60  Resp: 16 16 14    Temp: 97.8 F (36.6 C) 98.9 F (37.2 C) 99.6 F (37.6 C) 99.5 F (37.5 C)  TempSrc: Oral  Oral Oral  SpO2: 98% 99% 99% 99%  Weight:      Height:        Intake/Output Summary (Last 24 hours) at 12/20/2021 1911 Last data filed at 12/20/2021 1700 Gross per 24 hour  Intake 560 ml  Output 300 ml  Net 260 ml   Filed Weights   12/14/21 1622  12/15/21 1207  Weight: 74.8 kg 64.3 kg    Examination:  General: No acute distress. Cardiovascular: RRR Lungs: unlabored Abdomen: Soft, nontender, nondistended  Neurological: Alert and oriented 3. Moves all extremities 4. Cranial nerves II through XII grossly intact. Extremities: R arm splint    Data Reviewed: I have personally reviewed following labs and imaging studies  CBC: Recent Labs  Lab 12/14/21 1640 12/15/21 0424 12/17/21 0548 12/20/21 1009  WBC 15.7* 15.0* 11.4* 8.5  NEUTROABS 12.3*  --  8.9* 5.3  HGB 13.8 12.7* 13.4 12.6*  HCT 42.5 38.1* 39.1 37.4*  MCV 87.1 85.0 82.8 85.6  PLT 435* 369 392 440*    Basic Metabolic Panel: Recent Labs  Lab 12/14/21 1640 12/15/21 0424 12/16/21 0731 12/17/21 0548 12/18/21 0613 12/20/21 1009  NA 139 139  --  139 140 137  K 3.8 4.1  --  4.0 3.8 4.3  CL 99 103  --  102 104 99  CO2 30 29  --  28 27 30   GLUCOSE 111* 114*  --  118* 123* 115*  BUN 21* 15  --  13 20 15   CREATININE 1.61* 1.00 1.08 0.96 0.79 1.10  CALCIUM 10.1 9.2  --  9.5 9.0 9.3  MG  --   --   --  2.0 2.1 2.2  PHOS  --   --   --  3.3 2.9 3.7    GFR: Estimated Creatinine Clearance: 87.7 mL/min (by C-G formula based on SCr of 1.1 mg/dL).  Liver Function Tests: Recent Labs  Lab 12/17/21 0548 12/18/21 0613 12/20/21 1009  AST 17 28 24   ALT 15 26 23   ALKPHOS 60 78 50  BILITOT 0.6 0.9 0.5  PROT 8.4* 7.0 8.0  ALBUMIN 3.3* 3.2* 3.3*    CBG: No results for input(s): GLUCAP in the last 168 hours.   Recent Results (from the past 240 hour(s))  Blood culture (routine x 2)     Status: Abnormal (Preliminary result)   Collection Time: 12/14/21  5:25 PM   Specimen: BLOOD  Result Value Ref Range Status   Specimen Description   Final    BLOOD LEFT ANTECUBITAL Performed at [facility] [address] [place], [place] [address]    Special Requests   Final    BOTTLES DRAWN AEROBIC AND ANAEROBIC Blood Culture adequate volume Performed  at [facility] [address] [place],  [address]    Culture  Setup Time   Final  YEAST AEROBIC BOTTLE ONLY CRITICAL RESULT CALLED TO, READ BACK BY AND VERIFIED WITH: PHARMD J.LEGGE AT [id number] ON 12/18/2021 BY T.SAAD. Performed at [facility], [place] [address] [place], [place] [address]    Culture CANDIDA GLABRATA (Hardie Veltre)  Final   Report Status PENDING  Incomplete  Blood Culture ID Panel (Reflexed)     Status: Abnormal   Collection Time: 12/14/21  5:25 PM  Result Value Ref Range Status   Enterococcus faecalis NOT DETECTED NOT DETECTED Final   Enterococcus Faecium NOT DETECTED NOT DETECTED Final   Listeria monocytogenes NOT DETECTED NOT DETECTED Final   Staphylococcus species NOT DETECTED NOT DETECTED Final   Staphylococcus aureus (BCID) NOT DETECTED NOT DETECTED Final   Staphylococcus epidermidis NOT DETECTED NOT DETECTED Final   Staphylococcus lugdunensis NOT DETECTED NOT DETECTED Final   Streptococcus species NOT DETECTED NOT DETECTED Final   Streptococcus agalactiae NOT DETECTED NOT DETECTED Final   Streptococcus pneumoniae NOT DETECTED NOT DETECTED Final   Streptococcus pyogenes NOT DETECTED NOT DETECTED Final   Marta Bouie.calcoaceticus-baumannii NOT DETECTED NOT DETECTED Final   Bacteroides fragilis NOT DETECTED NOT DETECTED Final   Enterobacterales NOT DETECTED NOT DETECTED Final   Enterobacter cloacae complex NOT DETECTED NOT DETECTED Final   Escherichia coli NOT DETECTED NOT DETECTED Final   Klebsiella aerogenes NOT DETECTED NOT DETECTED Final   Klebsiella oxytoca NOT DETECTED NOT DETECTED Final   Klebsiella pneumoniae NOT DETECTED NOT DETECTED Final   Proteus species NOT DETECTED NOT DETECTED Final   Salmonella species NOT DETECTED NOT DETECTED Final   Serratia marcescens NOT DETECTED NOT DETECTED Final   Haemophilus influenzae NOT DETECTED NOT DETECTED Final   Neisseria meningitidis NOT DETECTED NOT DETECTED Final   Pseudomonas aeruginosa NOT  DETECTED NOT DETECTED Final   Stenotrophomonas maltophilia NOT DETECTED NOT DETECTED Final   Candida albicans NOT DETECTED NOT DETECTED Final   Candida auris NOT DETECTED NOT DETECTED Final   Candida glabrata DETECTED (Tywana Robotham) NOT DETECTED Final    Comment: CRITICAL RESULT CALLED TO, READ BACK BY AND VERIFIED WITH: PHARMD J.LEGGE AT 1721 ON 12/18/2021 BY T.SAAD.    Candida krusei NOT DETECTED NOT DETECTED Final   Candida parapsilosis NOT DETECTED NOT DETECTED Final   Candida tropicalis NOT DETECTED NOT DETECTED Final   Cryptococcus neoformans/gattii NOT DETECTED NOT DETECTED Final    Comment: Performed at [facility], [place] [address], [place], [place] [address]  Blood culture (routine x 2)     Status: None   Collection Time: 12/14/21 10:30 PM   Specimen: BLOOD  Result Value Ref Range Status   Specimen Description   Final    BLOOD LEFT ARM Performed at [facility] [address] [place], [place] [address]    Special Requests   Final    BOTTLES DRAWN AEROBIC AND ANAEROBIC Blood Culture adequate volume Performed at [facility] [address], [place], [place] [address]    Culture   Final    NO GROWTH 5 DAYS Performed at [facility], [place] [address], [place], [place] [address]    Report Status 12/20/2021 FINAL  Final  Resp Panel by RT-PCR (Flu Ambrie Carte&B, Covid)     Status: None   Collection Time: 12/14/21 10:30 PM   Specimen: Nasopharyngeal(NP) swabs in vial transport medium  Result Value Ref Range Status   SARS Coronavirus 2 by RT PCR NEGATIVE NEGATIVE Final    Comment: (NOTE) SARS-CoV-2 target nucleic acids are NOT DETECTED.  The  SARS-CoV-2 RNA is generally detectable in upper respiratory specimens during the acute phase of infection. The lowest concentration of SARS-CoV-2 viral copies this assay can detect is 138 copies/mL. Jamaya Sleeth negative result does not preclude SARS-Cov-2 infection and should not be used as the sole basis for  treatment or other patient management decisions. Finleigh Cheong negative result may occur with  improper specimen collection/handling, submission of specimen other than nasopharyngeal swab, presence of viral mutation(s) within the areas targeted by this assay, and inadequate number of viral copies(<138 copies/mL). Dequincy Born negative result must be combined with clinical observations, patient history, and epidemiological information. The expected result is Negative.  Fact Sheet for Patients:  EntrepreneurPulse.com.au  Fact Sheet for Healthcare Providers:  IncredibleEmployment.be  This test is no t yet approved or cleared by the [place] FDA and  has been authorized for detection and/or diagnosis of SARS-CoV-2 by FDA under an Emergency Use Authorization (EUA). This EUA will remain  in effect (meaning this test can be used) for the duration of the COVID-19 declaration under Section 564(b)(1) of the Act, 21 U.S.C.section 360bbb-3(b)(1), unless the authorization is terminated  or revoked sooner.       Influenza Jerni Selmer by PCR NEGATIVE NEGATIVE Final   Influenza B by PCR NEGATIVE NEGATIVE Final    Comment: (NOTE) The Xpert Xpress SARS-CoV-2/FLU/RSV plus assay is intended as an aid in the diagnosis of influenza from Nasopharyngeal swab specimens and should not be used as Roseland Braun sole basis for treatment. Nasal washings and aspirates are unacceptable for Xpert Xpress SARS-CoV-2/FLU/RSV testing.  Fact Sheet for Patients: EntrepreneurPulse.com.au  Fact Sheet for Healthcare Providers: IncredibleEmployment.be  This test is not yet approved or cleared by the [place] FDA and has been authorized for detection and/or diagnosis of SARS-CoV-2 by FDA under an Emergency Use Authorization (EUA). This EUA will remain in effect (meaning this test can be used) for the duration of the COVID-19 declaration under Section 564(b)(1) of the Act, 21  U.S.C. section 360bbb-3(b)(1), unless the authorization is terminated or revoked.  Performed at [facility], [place] [address], [place], [place] [address]   Surgical PCR screen     Status: None   Collection Time: 12/15/21 12:58 PM   Specimen: Nasal Mucosa; Nasal Swab  Result Value Ref Range Status   MRSA, PCR NEGATIVE NEGATIVE Final   Staphylococcus aureus NEGATIVE NEGATIVE Final    Comment: (NOTE) The Xpert SA Assay (FDA approved for NASAL specimens in patients 38 years of age and older), is one component of Jenasia Dolinar comprehensive surveillance program. It is not intended to diagnose infection nor to guide or monitor treatment. Performed at [facility], [place] [address], [place], [place] [address]   Aerobic/Anaerobic Culture w Gram Stain (surgical/deep wound)     Status: None   Collection Time: 12/15/21  5:06 PM   Specimen: PATH Other; Tissue  Result Value Ref Range Status   Specimen Description   Final    TISSUE Performed at [facility] [address], [place], [place] [address]    Special Requests   Final    NONE SHOULDER ABSCESS Performed at [facility], [place] [address] [place], [place] [address]    Gram Stain   Final    ABUNDANT WBC PRESENT, PREDOMINANTLY PMN FEW GRAM POSITIVE COCCI    Culture   Final    ABUNDANT STREPTOCOCCUS INTERMEDIUS NO ANAEROBES ISOLATED Performed at [facility], [place] [address] [place],  [address]    Report  Status 12/20/2021 FINAL  Final   Organism ID, Bacteria STREPTOCOCCUS INTERMEDIUS  Final      Susceptibility   Streptococcus intermedius - MIC*    PENICILLIN <=0.06 SENSITIVE Sensitive     CEFTRIAXONE <=0.12 SENSITIVE Sensitive     ERYTHROMYCIN 4 RESISTANT Resistant     LEVOFLOXACIN <=0.25 SENSITIVE Sensitive     VANCOMYCIN 0.25 SENSITIVE Sensitive     * ABUNDANT STREPTOCOCCUS INTERMEDIUS  Aerobic/Anaerobic Culture w Gram Stain (surgical/deep wound)      Status: None   Collection Time: 12/15/21  5:06 PM   Specimen: PATH Other; Tissue  Result Value Ref Range Status   Specimen Description   Final    TISSUE Performed at [facility] [address] [place], [place] [address]    Special Requests   Final    NONE HAND ABSCESS Performed at [facility], [place] [address] [place], [place] [address]    Gram Stain   Final    ABUNDANT WBC PRESENT, PREDOMINANTLY PMN FEW GRAM POSITIVE COCCI RARE GRAM NEGATIVE RODS    Culture   Final    ABUNDANT STREPTOCOCCUS INTERMEDIUS FEW HAEMOPHILUS PARAINFLUENZAE BETA LACTAMASE NEGATIVE ABUNDANT PREVOTELLA DENTICOLA BETA LACTAMASE POSITIVE Performed at [facility], [address], [place], [place] [address]    Report Status 12/18/2021 FINAL  Final   Organism ID, Bacteria STREPTOCOCCUS INTERMEDIUS  Final      Susceptibility   Streptococcus intermedius - MIC*    PENICILLIN <=0.06 SENSITIVE Sensitive     CEFTRIAXONE <=0.12 SENSITIVE Sensitive     ERYTHROMYCIN 4 RESISTANT Resistant     LEVOFLOXACIN <=0.25 SENSITIVE Sensitive     VANCOMYCIN 0.25 SENSITIVE Sensitive     * ABUNDANT STREPTOCOCCUS INTERMEDIUS  Urine Culture     Status: None   Collection Time: 12/17/21  8:07 AM   Specimen: Urine, Clean Catch  Result Value Ref Range Status   Specimen Description   Final    URINE, CLEAN CATCH Performed at [facility], [place] [address], [place], [place] [address]    Special Requests   Final    NONE Performed at [facility], [place] [address] [place], [place] [address]    Culture   Final    NO GROWTH Performed at [facility], [place] [address] [place],  [address]    Report Status 12/18/2021 FINAL  Final         Radiology Studies: ECHOCARDIOGRAM COMPLETE  Result Date: 12/19/2021    ECHOCARDIOGRAM REPORT   Patient Name:   [facility] Date of Exam: 12/19/2021 Medical Rec #:  [id number]    Height:       74.0  in Accession #:    [id number]   Weight:       141.8 lb Date of Birth:  Dec 16, 1990    BSA:          1.878 m Patient Age:    30 years     BP:           131/86 mmHg Patient Gender: M            HR:           54 bpm. Exam Location:  Inpatient Procedure: 2D Echo, Cardiac Doppler, Color Doppler and Strain Analysis Indications:    Sepsis  History:        Patient has no prior history of Echocardiogram examinations.  Signs/Symptoms:Shortness of Breath and Chest Pain; Risk                 Factors:Current Smoker.  Sonographer:    Luisa Hart RDCS Referring Phys: [phone] Vernestine Brodhead CALDWELL POWELL [place]  1. Left ventricular ejection fraction, by estimation, is 55 to 60%. The left ventricle has normal function. The left ventricle has no regional wall motion abnormalities. Left ventricular diastolic parameters are consistent with Grade I diastolic dysfunction (impaired relaxation). The average left ventricular global longitudinal strain is -16.5 %. The global longitudinal strain is abnormal.  2. Right ventricular systolic function is normal. The right ventricular size is normal.  3. The mitral valve is normal in structure. Trivial mitral valve regurgitation.  4. The aortic valve is tricuspid. Aortic valve regurgitation is not visualized. No aortic stenosis is present.  5. The inferior vena cava is dilated in size with <50% respiratory variability, suggesting right atrial pressure of 15 mmHg. Comparison(s): No prior Echocardiogram. Conclusion(s)/Recommendation(s): No evidence of valvular vegetations on this transthoracic echocardiogram. Consider Moria Brophy transesophageal echocardiogram to exclude infective endocarditis if clinically indicated. FINDINGS  Left Ventricle: Left ventricular ejection fraction, by estimation, is 55 to 60%. The left ventricle has normal function. The left ventricle has no regional wall motion abnormalities. The average left ventricular global longitudinal strain is -16.5 %. The global longitudinal  strain is abnormal. The left ventricular internal cavity size was normal in size. There is no left ventricular hypertrophy. Left ventricular diastolic parameters are consistent with Grade I diastolic dysfunction (impaired relaxation). Right Ventricle: The right ventricular size is normal. No increase in right ventricular wall thickness. Right ventricular systolic function is normal. Left Atrium: Left atrial size was normal in size. Right Atrium: Right atrial size was normal in size. Pericardium: There is no evidence of pericardial effusion. Mitral Valve: The mitral valve is normal in structure. Trivial mitral valve regurgitation. Tricuspid Valve: The tricuspid valve is normal in structure. Tricuspid valve regurgitation is trivial. Aortic Valve: The aortic valve is tricuspid. Aortic valve regurgitation is not visualized. No aortic stenosis is present. Aortic valve mean gradient measures 4.0 mmHg. Aortic valve peak gradient measures 9.0 mmHg. Aortic valve area, by VTI measures 2.76 cm. Pulmonic Valve: The pulmonic valve was not well visualized. Pulmonic valve regurgitation is not visualized. Aorta: The aortic root is normal in size and structure. Venous: The inferior vena cava is dilated in size with less than 50% respiratory variability, suggesting right atrial pressure of 15 mmHg. IAS/Shunts: The atrial septum is grossly normal.  LEFT VENTRICLE PLAX 2D LVIDd:         4.90 cm     Diastology LVIDs:         3.10 cm     LV e' medial:    6.42 cm/s LV PW:         1.00 cm     LV E/e' medial:  6.7 LV IVS:        0.90 cm     LV e' lateral:   5.33 cm/s LVOT diam:     2.20 cm     LV E/e' lateral: 8.0 LV SV:         85 LV SV Index:   45          2D Longitudinal Strain LVOT Area:     3.80 cm    2D Strain GLS Avg:     -16.5 %  LV Volumes (MOD) LV vol d, MOD A2C: 73.4 ml LV vol d, MOD A4C:  91.7 ml LV vol s, MOD A2C: 30.1 ml LV vol s, MOD A4C: 37.5 ml LV SV MOD A2C:     43.3 ml LV SV MOD A4C:     91.7 ml LV SV MOD BP:      52.5  ml RIGHT VENTRICLE RV Basal diam:  4.20 cm RV Mid diam:    3.30 cm RV S prime:     17.60 cm/s TAPSE (M-mode): 3.0 cm LEFT ATRIUM             Index        RIGHT ATRIUM           Index LA diam:        2.70 cm 1.44 cm/m   RA Area:     17.70 cm LA Vol (A2C):   26.4 ml 14.06 ml/m  RA Volume:   50.90 ml  27.11 ml/m LA Vol (A4C):   24.3 ml 12.94 ml/m LA Biplane Vol: 25.3 ml 13.47 ml/m  AORTIC VALVE                    PULMONIC VALVE AV Area (Vmax):    3.12 cm     PV Vmax:       0.76 m/s AV Area (Vmean):   3.09 cm     PV Vmean:      50.400 cm/s AV Area (VTI):     2.76 cm     PV VTI:        0.159 m AV Vmax:           150.00 cm/s  PV Peak grad:  2.3 mmHg AV Vmean:          94.700 cm/s  PV Mean grad:  1.0 mmHg AV VTI:            0.309 m AV Peak Grad:      9.0 mmHg AV Mean Grad:      4.0 mmHg LVOT Vmax:         123.00 cm/s LVOT Vmean:        77.100 cm/s LVOT VTI:          0.224 m LVOT/AV VTI ratio: 0.72  AORTA Ao Asc diam: 2.50 cm MITRAL VALVE               TRICUSPID VALVE MV Area (PHT): 2.76 cm    TR Peak grad:   23.8 mmHg MV Decel Time: 275 msec    TR Vmax:        244.00 cm/s MV E velocity: 42.70 cm/s MV Zeina Akkerman velocity: 42.70 cm/s  SHUNTS MV E/Sedonia Kitner ratio:  1.00        Systemic VTI:  0.22 m                            Systemic Diam: 2.20 cm Gwyndolyn Kaufman MD Electronically signed by Gwyndolyn Kaufman MD Signature Date/Time: 12/19/2021/3:49:36 PM    Final      Scheduled Meds:  buprenorphine-naloxone  1 tablet Sublingual BID   enoxaparin (LOVENOX) injection  40 mg Subcutaneous Q24H   lidocaine  1 patch Transdermal Q24H   mouth rinse  15 mL Mouth Rinse BID   nicotine  21 mg Transdermal Daily   polyethylene glycol  17 g Oral Daily   [START ON 12/23/2021] thiamine  100 mg Oral Daily   Continuous Infusions:  ampicillin-sulbactam (UNASYN) IV 3 g (12/20/21 1555)   micafungin (MYCAMINE) IV 100  mg (12/20/21 1233)   thiamine injection 500 mg (12/20/21 1430)     LOS: 6 days    Time spent: over 30 min    Fayrene Helper, MD [facility]   To contact the attending provider between 7A-7P or the covering provider during after hours 7P-7A, please log into the web site [email or web address] and access using universal [place] password for that web site. If you do not have the password, please call the hospital operator.  12/20/2021, 7:11 PM

## 2021-12-20 NOTE — Progress Notes (Signed)
Attempted to get pt around 9:15 for his MRI. RN thought pt was ready. Once MRI tech extender go to his room it was discovered his IV was no longer working. RN attempted to get a new IV unsuccessfully. MRI had to move on to the next pt. MRI will return to this pt after PM'S are preformed on scanner today. RN aware.

## 2021-12-21 ENCOUNTER — Encounter (HOSPITAL_COMMUNITY)
Admission: EM | Disposition: A | Discharge status: Home / Self Care | Payer: Self-pay | Source: Home / Self Care | Attending: Family Medicine

## 2021-12-21 ENCOUNTER — Other Ambulatory Visit (HOSPITAL_COMMUNITY): Payer: Self-pay

## 2021-12-21 ENCOUNTER — Encounter (HOSPITAL_COMMUNITY): Payer: Self-pay | Admitting: Family Medicine

## 2021-12-21 ENCOUNTER — Inpatient Hospital Stay (HOSPITAL_COMMUNITY): Payer: Self-pay

## 2021-12-21 ENCOUNTER — Inpatient Hospital Stay (HOSPITAL_COMMUNITY): Payer: Self-pay | Admitting: General Practice

## 2021-12-21 DIAGNOSIS — J45909 Unspecified asthma, uncomplicated: Secondary | ICD-10-CM

## 2021-12-21 DIAGNOSIS — F419 Anxiety disorder, unspecified: Secondary | ICD-10-CM

## 2021-12-21 DIAGNOSIS — R7881 Bacteremia: Secondary | ICD-10-CM

## 2021-12-21 DIAGNOSIS — N289 Disorder of kidney and ureter, unspecified: Secondary | ICD-10-CM

## 2021-12-21 DIAGNOSIS — B379 Candidiasis, unspecified: Secondary | ICD-10-CM

## 2021-12-21 HISTORY — PX: TEE WITHOUT CARDIOVERSION: SHX5443

## 2021-12-21 LAB — CBC WITH DIFFERENTIAL/PLATELET
Abs Immature Granulocytes: 0.04 10*3/uL (ref 0.00–0.07)
Basophils Absolute: 0.1 10*3/uL (ref 0.0–0.1)
Basophils Relative: 1 %
Eosinophils Absolute: 0.1 10*3/uL (ref 0.0–0.5)
Eosinophils Relative: 1 %
HCT: 35.1 % — ABNORMAL LOW (ref 39.0–52.0)
Hemoglobin: 11.6 g/dL — ABNORMAL LOW (ref 13.0–17.0)
Immature Granulocytes: 0 %
Lymphocytes Relative: 32 %
Lymphs Abs: 3.2 10*3/uL (ref 0.7–4.0)
MCH: 28.7 pg (ref 26.0–34.0)
MCHC: 33 g/dL (ref 30.0–36.0)
MCV: 86.9 fL (ref 80.0–100.0)
Monocytes Absolute: 0.6 10*3/uL (ref 0.1–1.0)
Monocytes Relative: 6 %
Neutro Abs: 5.9 10*3/uL (ref 1.7–7.7)
Neutrophils Relative %: 60 %
Platelets: 369 10*3/uL (ref 150–400)
RBC: 4.04 MIL/uL — ABNORMAL LOW (ref 4.22–5.81)
RDW: 13.9 % (ref 11.5–15.5)
WBC: 9.9 10*3/uL (ref 4.0–10.5)
nRBC: 0 % (ref 0.0–0.2)

## 2021-12-21 LAB — COMPREHENSIVE METABOLIC PANEL
ALT: 30 U/L (ref 0–44)
AST: 34 U/L (ref 15–41)
Albumin: 3.2 g/dL — ABNORMAL LOW (ref 3.5–5.0)
Alkaline Phosphatase: 46 U/L (ref 38–126)
Anion gap: 8 (ref 5–15)
BUN: 14 mg/dL (ref 6–20)
CO2: 28 mmol/L (ref 22–32)
Calcium: 9.1 mg/dL (ref 8.9–10.3)
Chloride: 103 mmol/L (ref 98–111)
Creatinine, Ser: 0.96 mg/dL (ref 0.61–1.24)
GFR, Estimated: >60 mL/min (ref >60)
Glucose, Bld: 83 mg/dL (ref 70–99)
Potassium: 3.9 mmol/L (ref 3.5–5.1)
Sodium: 139 mmol/L (ref 135–145)
Total Bilirubin: 0.5 mg/dL (ref 0.3–1.2)
Total Protein: 7.3 g/dL (ref 6.5–8.1)

## 2021-12-21 LAB — PHOSPHORUS: Phosphorus: 3.9 mg/dL (ref 2.5–4.6)

## 2021-12-21 LAB — FLUORESCENT TREPONEMAL AB(FTA)-IGG-BLD: Fluorescent Treponemal Ab, IgG: NONREACTIVE

## 2021-12-21 LAB — MAGNESIUM: Magnesium: 2.2 mg/dL (ref 1.7–2.4)

## 2021-12-21 SURGERY — ECHOCARDIOGRAM, TRANSESOPHAGEAL
Anesthesia: Monitor Anesthesia Care

## 2021-12-21 MED ORDER — PROPOFOL 500 MG/50ML IV EMUL
INTRAVENOUS | Status: DC | PRN
  Administered 2021-12-21: 175 ug/kg/min via INTRAVENOUS

## 2021-12-21 MED ORDER — LIDOCAINE 2% (20 MG/ML) 5 ML SYRINGE
INTRAMUSCULAR | Status: DC | PRN
Start: 2021-12-21 — End: 2021-12-21
  Administered 2021-12-21: 100 mg via INTRAVENOUS

## 2021-12-21 MED ORDER — PROPOFOL 10 MG/ML IV BOLUS
INTRAVENOUS | Status: DC | PRN
  Administered 2021-12-21: 50 mg via INTRAVENOUS
  Administered 2021-12-21 (×2): 20 mg via INTRAVENOUS
  Administered 2021-12-21: 50 mg via INTRAVENOUS
  Administered 2021-12-21: 30 mg via INTRAVENOUS

## 2021-12-21 MED ORDER — SODIUM CHLORIDE 0.9 % IV SOLN: INTRAVENOUS | Status: DC | PRN

## 2021-12-21 MED ORDER — DEXMEDETOMIDINE (PRECEDEX) IN NS 20 MCG/5ML (4 MCG/ML) IV SYRINGE
PREFILLED_SYRINGE | INTRAVENOUS | Status: DC | PRN
Start: 2021-12-21 — End: 2021-12-21
  Administered 2021-12-21: 4 ug via INTRAVENOUS
  Administered 2021-12-21 (×2): 8 ug via INTRAVENOUS

## 2021-12-21 NOTE — Interval H&P Note (Signed)
History and Physical Interval Note:  12/21/2021 1:17 PM  Stephen Shepard  has presented today for surgery, with the diagnosis of BACTERIMIA; IV DRUG USE.  The various methods of treatment have been discussed with the patient and family. After consideration of risks, benefits and other options for treatment, the patient has consented to  Procedure(s): TRANSESOPHAGEAL ECHOCARDIOGRAM (TEE) (N/A) as a surgical intervention.  The patient's history has been reviewed, patient examined, no change in status, stable for surgery.  I have reviewed the patient's chart and labs.  Questions were answered to the patient's satisfaction.     Donato Heinz

## 2021-12-21 NOTE — Anesthesia Postprocedure Evaluation (Signed)
Anesthesia Post Note  Patient: Stephen Shepard  Procedure(s) Performed: TRANSESOPHAGEAL ECHOCARDIOGRAM (TEE)     Patient location during evaluation: Endoscopy Anesthesia Type: MAC Level of consciousness: awake and alert, patient cooperative and oriented Pain management: pain level controlled Vital Signs Assessment: post-procedure vital signs reviewed and stable Respiratory status: spontaneous breathing, nonlabored ventilation and respiratory function stable Cardiovascular status: blood pressure returned to baseline and stable Postop Assessment: no apparent nausea or vomiting Anesthetic complications: no   No notable events documented.  Last Vitals:  Vitals:   12/21/21 1352 12/21/21 1402  BP: 105/61 114/77  Pulse: 64 62  Resp: 15 18  Temp: (!) 36.3 C   SpO2: 100% 100%    Last Pain:  Vitals:   12/21/21 1402  TempSrc:   PainSc: Asleep                 Eyonna Sandstrom,E. Cassandria Drew

## 2021-12-21 NOTE — Plan of Care (Signed)

## 2021-12-21 NOTE — TOC Initial Note (Signed)
Transition of Care [facility]) - Initial/Assessment Note    Patient Details  Name: Stephen Shepard MRN: [id number] Date of Birth: 04/08/1991  [facility]) CM/SW Contact:    Bethann [place]ks[place], [place] Phone Number: 12/21/2021, 4:01 PM  Clinical Narrative:                  CSW met with pt in response to SA consult. Pt states he lives with his girlfriend in a house in [place]. 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 His girlfriend works daily. 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 Indicates some marijuana use; denies alcohol or any other substance use. Pt reports he has not had any substance use treatment in the past though had been to [place] meetings years ago when he had a DUI. Pt is already connected with syringe exchange, [facility]; CSW provided pt with printed info for other local syringe exchange, [facility]. CSW provided resource list for substance use treatment as well. Explained the different levels of care. Pt seems most interested in continuing on suboxone. 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 CSW provided info for [facility] which would be private pay(around 14-21$/day) or they take some insurances. Both these clinics only do walk-in's for intake. Pt would likely need a prescription for suboxone at [facility] to bridge him until he can establish with a suboxone provider as it is unlikely that he would able to complete outpatient intake and receive suboxone the day immediately after d/c. CSW also discussed narcotics anonymous and provided pt with a local meeting schedule and website.   Expected Discharge Plan: Home/Self Care Barriers to Discharge: Continued Medical Work up   Patient Goals and CMS Choice        Expected Discharge Plan  and Services Expected Discharge Plan: Home/Self Care                                              Prior Living Arrangements/Services   Lives with:: Significant Other          Need for Family Participation in Patient Care: No (Comment) Care giver support system in place?: No (comment)      Activities of Daily Living Home Assistive Devices/Equipment: None ADL Screening (condition at time of admission) Patient's cognitive ability adequate to safely complete daily activities?: Yes Is the patient deaf or have difficulty hearing?: No Does the patient have difficulty seeing, even when wearing glasses/contacts?: No Does the patient have difficulty concentrating, remembering, or making decisions?: No Patient able to express need for assistance with ADLs?: Yes Does the patient have difficulty dressing or bathing?: Yes Independently performs ADLs?: No Communication: Independent Dressing (OT): Needs assistance Is this a change from baseline?: Change from baseline, expected to last <3days Grooming: Independent Feeding: Independent Bathing: Needs assistance Is this a change from baseline?: Change from baseline, expected to last <3 days Toileting: Needs assistance Is this a change from baseline?: Change from baseline, expected to last <3 days In/Out Bed: Needs assistance Is this a change from baseline?: Change from baseline, expected to last <3 days Walks in Home: Independent Does the patient have difficulty  walking or climbing stairs?: No Weakness of Legs: None Weakness of Arms/Hands: Right  Permission Sought/Granted                  Emotional Assessment Appearance:: Appears stated age Attitude/Demeanor/Rapport: Engaged Affect (typically observed): Calm Orientation: : Oriented to Self, Oriented to Place, Oriented to  Time, Oriented to Situation Alcohol / Substance Use: Illicit Drugs Psych Involvement: No (comment)  Admission diagnosis:  Cellulitis  [L03.90] Cellulitis and abscess of hand [L03.119, L02.519] Patient Active Problem List   Diagnosis Date Noted   Candidemia ([place]) 12/19/2021   Unsteady gait when walking 12/19/2021   Chronic hepatitis C without hepatic coma ([place])    Tobacco abuse 12/18/2021   Prediabetes 12/17/2021   Anxiety 12/17/2021   Abdominal discomfort 12/17/2021   Hepatitis C infection 12/16/2021   Chest pain 12/16/2021   Shortness of breath 12/15/2021   Back pain 12/15/2021   Abscess of multiple sites 12/14/2021   IVDU (intravenous drug user) 12/14/2021   Sepsis ([place]) 12/14/2021   AKI (acute kidney injury) ([place]) 12/14/2021   PCP:  Patient, [name] (Inactive) Pharmacy:   [facility] [place], [place] - [address] [place] [address] Phone: [phone] Fax: [phone]     Social Determinants of Health (SDOH) Interventions    Readmission Risk Interventions     View : No data to display.

## 2021-12-21 NOTE — Transfer of Care (Signed)
Immediate Anesthesia Transfer of Care Note  Patient: Stephen Shepard  Procedure(s) Performed: TRANSESOPHAGEAL ECHOCARDIOGRAM (TEE)  Patient Location: Endoscopy Unit  Anesthesia Type:MAC  Level of Consciousness: drowsy  Airway & Oxygen Therapy: Patient Spontanous Breathing and Patient connected to nasal cannula oxygen  Post-op Assessment: Report given to RN and Post -op Vital signs reviewed and stable  Post vital signs: Reviewed and stable  Last Vitals:  Vitals Value Taken Time  BP 105/61 12/21/21 1352  Temp    Pulse 65 12/21/21 1352  Resp 15 12/21/21 1352  SpO2 100 % 12/21/21 1352  Vitals shown include unvalidated device data.  Last Pain:  Vitals:   12/21/21 1224  TempSrc: Temporal  PainSc: 0-No pain      Patients Stated Pain Goal: 0 (47/34/03 7096)  Complications: No notable events documented.

## 2021-12-21 NOTE — Progress Notes (Signed)
  Echocardiogram Echocardiogram Transesophageal has been performed.  Stephen Shepard 12/21/2021, 1:53 PM

## 2021-12-21 NOTE — Progress Notes (Signed)
OT Cancellation Note  Patient Details Name: Stephen Shepard MRN: [id number] DOB: 1991/03/24   Cancelled Treatment:    Reason Eval/Treat Not Completed: Patient at procedure or test/ unavailable. Patient at [facility] for TEE.  Devondre Guzzetta L Evelia Waskey 12/21/2021, 3:27 PM

## 2021-12-21 NOTE — Anesthesia Preprocedure Evaluation (Addendum)
Anesthesia Evaluation  Patient identified by MRN, date of birth, ID band Patient awake    Reviewed: Allergy & Precautions, NPO status , Patient's Chart, lab work & pertinent test results  History of Anesthesia Complications Negative for: history of anesthetic complications  Airway Mallampati: I  TM Distance: >3 FB Neck ROM: Full    Dental  (+) Poor Dentition, Missing, Loose, Dental Advisory Given   Pulmonary asthma , Current Smoker and Patient abstained from smoking.,    breath sounds clear to auscultation       Cardiovascular  Rhythm:Regular Rate:Normal  12/19/2021 ECHO: EF 55-60%. The LV has normal function, no regional wall motion abnormalities. Grade I DD, no significant valvular abnormalities   Neuro/Psych Anxiety negative neurological ROS     GI/Hepatic negative GI ROS, (+)     substance abuse (heroin and fentanyl)  IV drug use, Hepatitis -, C  Endo/Other  negative endocrine ROS  Renal/GU Renal InsufficiencyRenal disease     Musculoskeletal  (+) narcotic dependent  Abdominal   Peds  Hematology negative hematology ROS (+)   Anesthesia Other Findings   Reproductive/Obstetrics                            Anesthesia Physical Anesthesia Plan  ASA: 3  Anesthesia Plan: MAC   Post-op Pain Management: Minimal or no pain anticipated   Induction:   PONV Risk Score and Plan: 0  Airway Management Planned: Natural Airway and Nasal Cannula  Additional Equipment: None  Intra-op Plan:   Post-operative Plan:   Informed Consent: I have reviewed the patients History and Physical, chart, labs and discussed the procedure including the risks, benefits and alternatives for the proposed anesthesia with the patient or authorized representative who has indicated his/her understanding and acceptance.     Dental advisory given  Plan Discussed with: CRNA and Surgeon  Anesthesia Plan Comments:          Anesthesia Quick Evaluation

## 2021-12-21 NOTE — Anesthesia Procedure Notes (Signed)
Procedure Name: MAC Date/Time: 12/21/2021 1:20 PM Performed by: Dorann Lodge, CRNA Pre-anesthesia Checklist: Patient identified, Emergency Drugs available, Suction available and Patient being monitored Patient Re-evaluated:Patient Re-evaluated prior to induction Oxygen Delivery Method: Nasal cannula Airway Equipment and Method: Bite block

## 2021-12-21 NOTE — Progress Notes (Signed)
PROGRESS NOTE    Stephen Shepard  AVW:[id number] DOB: 1990/09/24 DOA: 12/14/2021 PCP: Patient, [name] (Inactive)     Brief Narrative:  Stephen Shepard is a 31 y.o. male with medical history significant of seizure not on antiepileptics and history of IV heroin drug use who presents with concerns of worsening abscess.  He's now s/p surgery with orthopedics/hand.  Culture directed abx.  Blood cultures with candida glabrata, ID consulted and workup underway.  See below for additional details   Subjective:  He is npo, sitting up in chair, awaiting for TEE, he denies acute complaints   Assessment & Plan:  Principal Problem:   Abscess of multiple sites Active Problems:   Candidemia (HCC)   Anxiety   Shortness of breath   IVDU (intravenous drug user)   Back pain   Unsteady gait when walking   AKI (acute kidney injury) (HCC)   Abdominal discomfort   Hepatitis C infection   Chest pain   Tobacco abuse   Prediabetes   Sepsis (HCC)   Chronic hepatitis C without hepatic coma (HCC)    Assessment and Plan:    sepsis/ Abscess of multiple sites, -POA  - S/p right shoulder incision and drainage subcutaneous abscess, R wrist and dorsum of the hand drainage of deep abscess, right hand fourth dorsal compartment tenosynovectomy, right hand third dorsal compartment tenosynovectomy, right hand second dorsal compartment tenosynovectomy on 5/18, f/u surgical cultures from (hand and shoulder from 5/18) -Recommending daily packing changes to wound on shoulder and dry dressing on incision -For R wrist and forearm, recommending keep splint in place  -Per ortho, f/u with Dr. Melvyn Novasrtmann later this week or early next  -Several other locations appear to have spontaneously drained.  -TEE today -currently on unasyn, will follow ID recommendation  candida fungemia , POA Blood cx growing candida glabrata Repeat blood cultures 5/23 no growth so far -Currently on micafungin -TEE today - Appreciate ID  recs, will follow recommendation   Anxiety Paranoid, anxious at times - his mom died in the hospital within past few years ? Component of withdrawal - see below Repeat utox (opiates, benzo, thc) Will follow for now  Shortness of breath/chest pain Negative troponin CT PE protocol with mild dependent bibasilar airspace opacities with associated volume loss favored to reflect atelectasis, small volume aspiration is a consideration follow  Unsteady gait when walking gradually improving, mental status improved as well. Will hold off on MRI brain  Back pain C/o thoracic back pain Plain films unremarkable CT t spine without evidence of fracture, malalignment, or destructive process within thoracic spine MRI T/L spine without evidence of infection No LE weakness or saddle anesthesia Improved with lidocaine patch   IVDU (intravenous drug user) Reports last heroin use a month ago -negative HIV -follow RPR, treponemal ab -follow acute hepatitis panel -> positive hep C ab Started on suboxone 5/20.  Will need to plan for discharge, taper vs follow outpatient with prescriber.    Abdominal discomfort resolved  AKI (acute kidney injury) (HCC) Resolved, follow  Hepatitis C infection Follow outpatient  Tobacco abuse Nicotine patch    Prediabetes A1c 6 Am blood glucose 83   The patient's BMI is: Body mass index is 18.2 kg/m.Marland Kitchen. Seen by dietician.  I agree with the assessment and plan as outlined below: Nutrition Status:       .  I have Reviewed nursing notes, Vitals, pain scores, I/o's, Lab results and  imaging results since pt's last encounter, details please see discussion  above  I ordered the following labs:  Unresulted Labs (From admission, onward)     Start     Ordered   12/20/21 1238  RPR  Once,   R        12/20/21 1238   12/20/21 0807  C-reactive protein  Once,   R       Question:  Specimen collection method  Answer:  Lab=Lab collect   12/20/21 0806    12/20/21 7408  Fluorescent treponemal ab(fta)-IgG-bld  Once,   R       Question:  Specimen collection method  Answer:  Lab=Lab collect   12/20/21 0806   12/14/21 1725  Antifungal AST 9 Drug Panel  Once,   R        12/14/21 1725             DVT prophylaxis: enoxaparin (LOVENOX) injection 40 mg Start: 12/20/21 1000 SCD's Start: 12/15/21 1819 SCDs Start: 12/14/21 2335   Code Status:   Code Status: Full Code  Family Communication: patient Disposition:   Status is: Inpatient   Dispo: The patient is from: home               Anticipated d/c is to: TBD              Anticipated d/c date is: TBD  Antimicrobials:   Anti-infectives (From admission, onward)    Start     Dose/Rate Route Frequency Ordered Stop   12/19/21 2200  Ampicillin-Sulbactam (UNASYN) 3 g in sodium chloride 0.9 % 100 mL IVPB        3 g 200 mL/hr over 30 Minutes Intravenous Every 6 hours 12/19/21 1256     12/19/21 2200  metroNIDAZOLE (FLAGYL) tablet 500 mg  Status:  Discontinued        500 mg Oral Every 12 hours 12/19/21 1240 12/19/21 1256   12/18/21 1800  micafungin (MYCAMINE) 100 mg in sodium chloride 0.9 % 100 mL IVPB        100 mg 110 mL/hr over 1 Hours Intravenous Daily 12/18/21 1557     12/18/21 1800  metroNIDAZOLE (FLAGYL) tablet 500 mg  Status:  Discontinued        500 mg Oral Every 8 hours 12/18/21 1559 12/19/21 1240   12/16/21 1000  cefTRIAXone (ROCEPHIN) 1 g in sodium chloride 0.9 % 100 mL IVPB  Status:  Discontinued        1 g 200 mL/hr over 30 Minutes Intravenous Every 24 hours 12/16/21 0817 12/19/21 1256   12/15/21 2000  vancomycin (VANCOREADY) IVPB 1750 mg/350 mL  Status:  Discontinued        1,750 mg 175 mL/hr over 120 Minutes Intravenous Every 24 hours 12/14/21 2351 12/15/21 0953   12/15/21 1215  ceFAZolin (ANCEF) IVPB 2g/100 mL premix        2 g 200 mL/hr over 30 Minutes Intravenous On call to O.R. 12/15/21 1206 12/15/21 1635   12/15/21 1000  vancomycin (VANCOREADY) IVPB 1250 mg/250 mL   Status:  Discontinued        1,250 mg 166.7 mL/hr over 90 Minutes Intravenous Every 12 hours 12/15/21 0954 12/18/21 1221   12/14/21 2345  azithromycin (ZITHROMAX) 500 mg in sodium chloride 0.9 % 250 mL IVPB  Status:  Discontinued        500 mg 250 mL/hr over 60 Minutes Intravenous Every 24 hours 12/14/21 2337 12/16/21 1901   12/14/21 1930  vancomycin (VANCOCIN) IVPB 1000 mg/200 mL premix  1,000 mg 200 mL/hr over 60 Minutes Intravenous  Once 12/14/21 1920 12/14/21 2352           Objective: Vitals:   12/21/21 0517 12/21/21 1224 12/21/21 1352 12/21/21 1402  BP: 116/75 128/72 105/61 114/77  Pulse: 65 (!) 57 64 62  Resp: 16 19 15 18   Temp: 97.9 F (36.6 C) (!) 97.1 F (36.2 C) (!) 97.3 F (36.3 C)   TempSrc: Axillary Temporal Temporal   SpO2: 95% 98% 100% 100%  Weight:  64.3 kg    Height:  6\' 2"  (1.88 m)      Intake/Output Summary (Last 24 hours) at 12/21/2021 1840 Last data filed at 12/21/2021 1800 Gross per 24 hour  Intake 350 ml  Output 1925 ml  Net -1575 ml   Filed Weights   12/14/21 1622 12/15/21 1207 12/21/21 1224  Weight: 74.8 kg 64.3 kg 64.3 kg    Examination:  General exam: appear weak, aaox3, NAD Respiratory system: Clear to auscultation. Respiratory effort normal. Cardiovascular system:  RRR.  Gastrointestinal system: Abdomen is nondistended, soft and nontender.  Normal bowel sounds heard. Central nervous system: Alert and oriented. No focal neurological deficits. Extremities:  multiple wounds Skin: multiple wounds Psychiatry: appear anxious      Data Reviewed: I have personally reviewed  labs and visualized  imaging studies since the last encounter and formulate the plan        Scheduled Meds:  buprenorphine-naloxone  1 tablet Sublingual BID   enoxaparin (LOVENOX) injection  40 mg Subcutaneous Q24H   lidocaine  1 patch Transdermal Q24H   mouth rinse  15 mL Mouth Rinse BID   nicotine  21 mg Transdermal Daily   polyethylene glycol  17 g  Oral Daily   [START ON 12/23/2021] thiamine  100 mg Oral Daily   Continuous Infusions:  ampicillin-sulbactam (UNASYN) IV 3 g (12/21/21 1650)   micafungin (MYCAMINE) IV 100 mg (12/21/21 0900)   thiamine injection 500 mg (12/21/21 0515)     LOS: 7 days     12/23/21, MD PhD FACP [facility]  Available via Epic secure chat 7am-7pm for nonurgent issues Please page for urgent issues To page the attending provider between 7A-7P or the covering provider during after hours 7P-7A, please log into the web site [email or web address] and access using universal [place] password for that web site. If you do not have the password, please call the hospital operator.    12/21/2021, 6:40 PM

## 2021-12-21 NOTE — CV Procedure (Signed)
     TRANSESOPHAGEAL ECHOCARDIOGRAM   NAME:  Stephen Shepard   MRN: [id number] DOB:  May 21, 1991   ADMIT DATE: 12/14/2021  INDICATIONS: Candidemia  PROCEDURE:   Informed consent was obtained prior to the procedure. The risks, benefits and alternatives for the procedure were discussed and the patient comprehended these risks.  Risks include, but are not limited to, cough, sore throat, vomiting, nausea, somnolence, esophageal and stomach trauma or perforation, bleeding, low blood pressure, aspiration, pneumonia, infection, trauma to the teeth and death.    After a procedural time-out, the oropharynx was anesthetized and the patient was sedated by the anesthesia service. The transesophageal probe was inserted in the esophagus and stomach without difficulty and multiple views were obtained. Anesthesia was monitored by Ander Purpura, CRNA and Dr Jean Rosenthal.    COMPLICATIONS:    There were no immediate complications.  FINDINGS: No vegetation seen    [name] MD [facility]  [address] [place], [place] [address] [phone]   1:46 PM

## 2021-12-21 NOTE — Progress Notes (Signed)
Patient returned from [facility] post TEE. Alert and oriented but sleepy from sedation. Heart monitor resumed and diet order received from attending MD.

## 2021-12-22 ENCOUNTER — Encounter (HOSPITAL_COMMUNITY): Payer: Self-pay | Admitting: Cardiology

## 2021-12-22 DIAGNOSIS — E440 Moderate protein-calorie malnutrition: Secondary | ICD-10-CM | POA: Insufficient documentation

## 2021-12-22 MED ORDER — ACETAMINOPHEN 325 MG PO TABS
650.0000 mg | ORAL_TABLET | Freq: Four times a day (QID) | ORAL | Status: DC | PRN
  Administered 2021-12-22 – 2021-12-28 (×5): 650 mg via ORAL
  Filled 2021-12-22 (×5): qty 2

## 2021-12-22 MED ORDER — JUVEN PO PACK
1.0000 | PACK | Freq: Two times a day (BID) | ORAL | Status: DC
  Administered 2021-12-22 – 2021-12-28 (×12): 1 via ORAL
  Filled 2021-12-22 (×12): qty 1

## 2021-12-22 MED ORDER — AMOXICILLIN-POT CLAVULANATE 875-125 MG PO TABS
1.0000 | ORAL_TABLET | Freq: Two times a day (BID) | ORAL | Status: DC
  Administered 2021-12-22 – 2021-12-28 (×13): 1 via ORAL
  Filled 2021-12-22 (×13): qty 1

## 2021-12-22 MED ORDER — ENSURE ENLIVE PO LIQD
237.0000 mL | Freq: Two times a day (BID) | ORAL | Status: DC
  Administered 2021-12-22 – 2021-12-28 (×12): 237 mL via ORAL

## 2021-12-22 MED ORDER — ADULT MULTIVITAMIN W/MINERALS CH
1.0000 | ORAL_TABLET | Freq: Every day | ORAL | Status: DC
  Administered 2021-12-22 – 2021-12-28 (×7): 1 via ORAL
  Filled 2021-12-22 (×7): qty 1

## 2021-12-22 NOTE — Progress Notes (Signed)
Physical Therapy Treatment Patient Details Name: Stephen Shepard MRN: [id number] DOB: 11-Jul-1991 Today's Date: 12/22/2021   History of Present Illness 31 y.o. male with medical history significant of seizure not on antiepileptics and history of IV heroin drug use who presents with concerns of worsening abscess.    PT Comments    Patient reports ambulating ad lib in room and in hall pushing IV. Patient did demonstrate  mild imbalance when not holding IV pole. Had patient  remove slide ins and demonstrated improved balance. Continue PT to ensure safe ambulation without any device    Recommendations for follow up therapy are one component of a multi-disciplinary discharge planning process, led by the attending physician.  Recommendations may be updated based on patient status, additional functional criteria and insurance authorization.  Follow Up Recommendations  No PT follow up     Assistance Recommended at Discharge PRN  Patient can return home with the following Assist for transportation;Assistance with cooking/housework;Help with stairs or ramp for entrance   Equipment Recommendations  None recommended by PT    Recommendations for Other Services       Precautions / Restrictions Precautions Precaution Comments: R shoulder dressing s/p I & D Required Braces or Orthoses: Splint/Cast;Sling Splint/Cast: R forearm     Mobility  Bed Mobility               General bed mobility comments: seated on window bench    Transfers Overall transfer level: Needs assistance Equipment used: None   Sit to Stand: Modified independent (Device/Increase time)                Ambulation/Gait Ambulation/Gait assistance: Min guard Gait Distance (Feet): 200 Feet Assistive device: None Gait Pattern/deviations: Drifts right/left, Staggering right, Staggering left       General Gait Details: patient has flops  on, noted  slight stagger/drift.  "tip"  in flops. Patient removed flops,  appeared  improved balance   Stairs             Wheelchair Mobility    Modified Rankin (Stroke Patients Only)       Balance   Sitting-balance support: Feet supported Sitting balance-Leahy Scale: Normal     Standing balance support: No upper extremity supported Standing balance-Leahy Scale: Fair                              Cognition Arousal/Alertness: Awake/alert Behavior During Therapy: WFL for tasks assessed/performed Overall Cognitive Status: Within Functional Limits for tasks assessed                                          Exercises      General Comments        Pertinent Vitals/Pain Pain Assessment Faces Pain Scale: Hurts little more Pain Location: RUE Pain Descriptors / Indicators: Grimacing, Guarding Pain Intervention(s): Patient requesting pain meds-RN notified    Home Living                          Prior Function            PT Goals (current goals can now be found in the care plan section) Progress towards PT goals: Progressing toward goals    Frequency    Min 3X/week      PT Plan Current plan  remains appropriate    Co-evaluation              AM-PAC PT "6 Clicks" Mobility   Outcome Measure  Help needed turning from your back to your side while in a flat bed without using bedrails?: None Help needed moving from lying on your back to sitting on the side of a flat bed without using bedrails?: None Help needed moving to and from a bed to a chair (including a wheelchair)?: None Help needed standing up from a chair using your arms (e.g., wheelchair or bedside chair)?: None Help needed to walk in hospital room?: A Little Help needed climbing 3-5 steps with a railing? : A Little 6 Click Score: 22    End of Session Equipment Utilized During Treatment: Gait belt Activity Tolerance: Patient tolerated treatment well Patient left:  (on window seat) Nurse Communication: Mobility status PT  Visit Diagnosis: Pain;Muscle weakness (generalized) (M62.81);Unsteadiness on feet (R26.81) Pain - Right/Left: Right Pain - part of body: Shoulder;Arm     Time: 1410-1420 PT Time Calculation (min) (ACUTE ONLY): 10 min  Charges:  $Gait Training: 8-22 mins                     Blanchard Kelch PT Acute Rehabilitation Services Pager [phone] Office [phone]     Rada Hay 12/22/2021, 2:47 PM

## 2021-12-22 NOTE — Progress Notes (Signed)
Id brief note   5/24 tee no vegetation Patient previously without vision complaint 5/23 repeat bcx in progress   A/p Ivdu Multiple skin abscesses Candida glabrata fungemia   Tee negative for IE   -change amp/sulbactam to oral amox-clav -continue iv micafungin pending c-glabrata susceptibility -f/u result repeat bcx from 5/23.  -I am ok with picc if needed to finish his micafungin treatment but would wait 5 days from 5/23 prior to placement to allow enough time for yeast to grow -eye exam for the fungemia can be done outpatient as at this time he has no visual changes -I would not send this patient out with a picc given concern for ivdu and noncompliance and poor insight -discussed with Dr Roda Shutters of primary team

## 2021-12-22 NOTE — Progress Notes (Signed)
Initial Nutrition Assessment  DOCUMENTATION CODES:   Non-severe (moderate) malnutrition in context of chronic illness, Underweight  INTERVENTION:   -Ensure Plus High Protein po BID, each supplement provides 350 kcal and 20 grams of protein.   -Multivitamin with minerals daily  -1 packet Juven BID, each packet provides 95 calories, 2.5 grams of protein (collagen), and 9.8 grams of carbohydrate (3 grams sugar); also contains 7 grams of L-arginine and L-glutamine, 300 mg vitamin C, 15 mg vitamin E, 1.2 mcg vitamin B-12, 9.5 mg zinc, 200 mg calcium, and 1.5 g  Calcium Beta-hydroxy-Beta-methylbutyrate to support wound healing    NUTRITION DIAGNOSIS:   Moderate Malnutrition related to chronic illness (substance abuse) as evidenced by moderate fat depletion, moderate muscle depletion.  GOAL:   Patient will meet greater than or equal to 90% of their needs  MONITOR:   PO intake, Supplement acceptance, Labs, Weight trends, I & O's, Skin  REASON FOR ASSESSMENT:   Consult Assessment of nutrition requirement/status  ASSESSMENT:   31 y.o. male with medical history significant of seizure not on antiepileptics and history of IV heroin drug use who presents with concerns of worsening abscess.  5/18: s/p I&D of right shoulder and hand  Patient in room, sitting by window. States his appetite is starting to improve. Ate sausage and potatoes for breakfast. States his appetite was very poor prior to his procedure on 5/18. He reports weight loss of around 5 lbs. Pt is agreeable to receiving Ensure and other supplements to aid in healing. Will add Juven and daily MVI.  Per weight records, unable to determine time frame of weight loss.   Medications: Miralax, IV Thiamine  Labs reviewed: UDS+ benzos, opiates, THC   NUTRITION - FOCUSED PHYSICAL EXAM:  Flowsheet Row Most Recent Value  Orbital Region Mild depletion  Upper Arm Region Moderate depletion  Thoracic and Lumbar Region Unable to  assess  Buccal Region Moderate depletion  Temple Region Moderate depletion  Clavicle Bone Region Moderate depletion  Clavicle and Acromion Bone Region Moderate depletion  Scapular Bone Region Unable to assess  Dorsal Hand No depletion  Patellar Region Moderate depletion  Anterior Thigh Region Moderate depletion  Posterior Calf Region Moderate depletion  Edema (RD Assessment) Moderate  [RUE]  Hair Unable to assess  [covered]  Eyes Reviewed  Mouth Reviewed  Skin Reviewed       Diet Order:   Diet Order             Diet regular Room service appropriate? Yes; Fluid consistency: Thin  Diet effective now                   EDUCATION NEEDS:   Education needs have been addressed  Skin:  Skin Assessment: Skin Integrity Issues: Skin Integrity Issues:: Incisions Incisions: 5/18: right hand, right shoulder  Last BM:  5/25  Height:   Ht Readings from Last 1 Encounters:  12/21/21 6\' 2"  (1.88 m)    Weight:   Wt Readings from Last 1 Encounters:  12/21/21 64.3 kg    BMI:  Body mass index is 18.2 kg/m.  Estimated Nutritional Needs:   Kcal:  1900-2100  Protein:  95-110g  Fluid:  2L/day  12/23/21, MS, RD, LDN Inpatient Clinical Dietitian Contact information available via Amion

## 2021-12-22 NOTE — Progress Notes (Signed)
PROGRESS NOTE    Stephen Shepard  ZOX:[id number] DOB: 07-19-1991 DOA: 12/14/2021 PCP: Patient, [name] (Inactive)     Brief Narrative:   H/o  seizure not on antiepileptics and history of IV heroin drug use who presents with concerns of worsening abscess.  He's now s/p surgery with orthopedics/hand.  Culture directed abx.  Blood cultures with candida glabrata, ID consulted and workup underway.     Subjective:  He is working with PT, pleasant today No fever  Assessment & Plan:  Principal Problem:   Abscess of multiple sites Active Problems:   Candidemia (HCC)   Anxiety   Shortness of breath   IVDU (intravenous drug user)   Back pain   Unsteady gait when walking   AKI (acute kidney injury) (HCC)   Abdominal discomfort   Hepatitis C infection   Chest pain   Tobacco abuse   Prediabetes   Sepsis (HCC)   Chronic hepatitis C without hepatic coma (HCC)   Malnutrition of moderate degree    Assessment and Plan:    sepsis/ Abscess of multiple sites, -POA  - S/p  I/D right shoulder subcutaneous abscess on 5/18, Recommending daily packing changes to wound on shoulder and dry dressing on incision, f/u on surgical cultures -s/p I/D deep abscess R wrist and dorsum of right hand on 5/18, For R wrist and forearm, recommending keep splint in place , does not need to change right hand dressing unless soiled per ortho (A xeroform or petroleum gauze over incision then dry gauze over that with splint and ACE),  f/u on surgical cultures -f/u with Dr. Melvyn Novasrtmann later this week or early next  -Several other abscesses  appear to have spontaneously drained.  -TEE on 5/24 negative for vegetation -abx changed from unasyn, to augmentin on 5/25, will follow ID recommendation  candida fungemia , POA -Blood cx obtained on admission 5/17 growing candida glabrata, Candida 9 panel pending -Repeat blood cultures 5/23 no growth so far -Currently on micafungin -TEE no vegetation -Denies vision  problem, ID recommend outpatient follow-up with ophthalmology - Appreciate ID recs, will follow recommendation   Anxiety Paranoid, anxious at times - his mom died in the hospital within past few years ? Component of withdrawal - see below Repeat utox (opiates, benzo, thc) Still refusing medication (such as  Lovenox , stool softeners )intermittently, but compliance with antibiotic antifungal regimen This morning he is pleasant  Shortness of breath/chest pain Negative troponin TTE/TEE unremarkable CT PE protocol with mild dependent bibasilar airspace opacities with associated volume loss favored to reflect atelectasis, small volume aspiration is a consideration follow  Unsteady gait when walking gradually improving, mental status improved as well. Will hold off on MRI brain  Back pain C/o thoracic back pain Plain films unremarkable CT t spine without evidence of fracture, malalignment, or destructive process within thoracic spine MRI T/L spine without evidence of infection No LE weakness or saddle anesthesia Improved with lidocaine patch   IVDU (intravenous drug user) Reports last heroin use a month ago -negative HIV -follow RPR, treponemal ab -follow acute hepatitis panel -> positive hep C ab Started on suboxone 5/20.  Will need to plan for discharge, taper vs follow outpatient with prescriber.    Abdominal discomfort resolved  AKI (acute kidney injury) (HCC) Resolved, follow  Hepatitis C infection Follow outpatient  Tobacco abuse Nicotine patch    Prediabetes A1c 6 Am blood glucose 83   The patient's BMI is: Body mass index is 18.2 kg/m.Marland Kitchen. Seen by dietician.  I agree with the assessment and plan as outlined below: Nutrition Status: Nutrition Problem: Moderate Malnutrition Etiology: chronic illness (substance abuse) Signs/Symptoms: moderate fat depletion, moderate muscle depletionInterventions: Ensure Enlive (each supplement provides 350kcal and 20 grams  of protein), MVI, Juven, Education  .  I have Reviewed nursing notes, Vitals, pain scores, I/o's, Lab results and  imaging results since pt's last encounter, details please see discussion above  I ordered the following labs:  Unresulted Labs (From admission, onward)     Start     Ordered   12/23/21 0500  Basic metabolic panel  Daily,   R     Question:  Specimen collection method  Answer:  IV Team=IV Team collect   12/22/21 1557   12/23/21 0500  Magnesium  Tomorrow morning,   R       Question:  Specimen collection method  Answer:  IV Team=IV Team collect   12/22/21 1557   12/20/21 1238  RPR  Once,   R        12/20/21 1238   12/20/21 0807  C-reactive protein  Once,   R       Question:  Specimen collection method  Answer:  Lab=Lab collect   12/20/21 0806             DVT prophylaxis: enoxaparin (LOVENOX) injection 40 mg Start: 12/20/21 1000 SCD's Start: 12/15/21 1819 SCDs Start: 12/14/21 2335   Code Status:   Code Status: Full Code  Family Communication: patient Disposition:   Status is: Inpatient   Dispo: The patient is from: home               Anticipated d/c is to: TBD, need ID clearance              Anticipated d/c date is: TBD, need ID clearance, will nee to follow up with ortho next  week, ortho need to see if remain in the hospital  Antimicrobials:   Anti-infectives (From admission, onward)    Start     Dose/Rate Route Frequency Ordered Stop   12/22/21 1400  amoxicillin-clavulanate (AUGMENTIN) 875-125 MG per tablet 1 tablet        1 tablet Oral Every 12 hours 12/22/21 1033     12/19/21 2200  Ampicillin-Sulbactam (UNASYN) 3 g in sodium chloride 0.9 % 100 mL IVPB  Status:  Discontinued        3 g 200 mL/hr over 30 Minutes Intravenous Every 6 hours 12/19/21 1256 12/22/21 1033   12/19/21 2200  metroNIDAZOLE (FLAGYL) tablet 500 mg  Status:  Discontinued        500 mg Oral Every 12 hours 12/19/21 1240 12/19/21 1256   12/18/21 1800  micafungin (MYCAMINE) 100 mg in  sodium chloride 0.9 % 100 mL IVPB        100 mg 110 mL/hr over 1 Hours Intravenous Daily 12/18/21 1557     12/18/21 1800  metroNIDAZOLE (FLAGYL) tablet 500 mg  Status:  Discontinued        500 mg Oral Every 8 hours 12/18/21 1559 12/19/21 1240   12/16/21 1000  cefTRIAXone (ROCEPHIN) 1 g in sodium chloride 0.9 % 100 mL IVPB  Status:  Discontinued        1 g 200 mL/hr over 30 Minutes Intravenous Every 24 hours 12/16/21 0817 12/19/21 1256   12/15/21 2000  vancomycin (VANCOREADY) IVPB 1750 mg/350 mL  Status:  Discontinued        1,750 mg 175 mL/hr over 120 Minutes Intravenous Every 24  hours 12/14/21 2351 12/15/21 0953   12/15/21 1215  ceFAZolin (ANCEF) IVPB 2g/100 mL premix        2 g 200 mL/hr over 30 Minutes Intravenous On call to O.R. 12/15/21 1206 12/15/21 1635   12/15/21 1000  vancomycin (VANCOREADY) IVPB 1250 mg/250 mL  Status:  Discontinued        1,250 mg 166.7 mL/hr over 90 Minutes Intravenous Every 12 hours 12/15/21 0954 12/18/21 1221   12/14/21 2345  azithromycin (ZITHROMAX) 500 mg in sodium chloride 0.9 % 250 mL IVPB  Status:  Discontinued        500 mg 250 mL/hr over 60 Minutes Intravenous Every 24 hours 12/14/21 2337 12/16/21 1901   12/14/21 1930  vancomycin (VANCOCIN) IVPB 1000 mg/200 mL premix        1,000 mg 200 mL/hr over 60 Minutes Intravenous  Once 12/14/21 1920 12/14/21 2352           Objective: Vitals:   12/21/21 1402 12/21/21 2007 12/22/21 0538 12/22/21 1356  BP: 114/77 (!) 97/54 126/82 125/70  Pulse: 62 (!) 54 (!) 50 70  Resp: 18 16 16 16   Temp:  99 F (37.2 C) 98 F (36.7 C) 98.1 F (36.7 C)  TempSrc:  Oral Oral Oral  SpO2: 100% 99% 100% 100%  Weight:      Height:        Intake/Output Summary (Last 24 hours) at 12/22/2021 1558 Last data filed at 12/22/2021 1242 Gross per 24 hour  Intake 2000 ml  Output 1400 ml  Net 600 ml   Filed Weights   12/14/21 1622 12/15/21 1207 12/21/21 1224  Weight: 74.8 kg 64.3 kg 64.3 kg    Examination:  General  exam: aaox3, NAD, pleasant Respiratory system: Clear to auscultation. Respiratory effort normal. Cardiovascular system:  RRR.  Gastrointestinal system: Abdomen is nondistended, soft and nontender.  Normal bowel sounds heard. Central nervous system: Alert and oriented. No focal neurological deficits. Extremities:  multiple wounds Skin: multiple wounds Psychiatry: Pleasant, calm and cooperative this morning    Data Reviewed: I have personally reviewed  labs and visualized  imaging studies since the last encounter and formulate the plan        Scheduled Meds:  amoxicillin-clavulanate  1 tablet Oral Q12H   buprenorphine-naloxone  1 tablet Sublingual BID   enoxaparin (LOVENOX) injection  40 mg Subcutaneous Q24H   feeding supplement  237 mL Oral BID BM   lidocaine  1 patch Transdermal Q24H   mouth rinse  15 mL Mouth Rinse BID   multivitamin with minerals  1 tablet Oral Daily   nicotine  21 mg Transdermal Daily   nutrition supplement (JUVEN)  1 packet Oral BID WC   polyethylene glycol  17 g Oral Daily   [START ON 12/23/2021] thiamine  100 mg Oral Daily   Continuous Infusions:  micafungin (MYCAMINE) IV 100 mg (12/22/21 1042)   thiamine injection 500 mg (12/22/21 1303)     LOS: 8 days     12/24/21, MD PhD FACP [facility]  Available via Epic secure chat 7am-7pm for nonurgent issues Please page for urgent issues To page the attending provider between 7A-7P or the covering provider during after hours 7P-7A, please log into the web site [email or web address] and access using universal [place] password for that web site. If you do not have the password, please call the hospital operator.    12/22/2021, 3:58 PM

## 2021-12-22 NOTE — Progress Notes (Signed)
This nurse had pulled 2 instead of 1 Suboxone due to previous order being 2, when returning suboxone bin did not open. Medication was returned to pharmacist. Louanne Skye

## 2021-12-22 NOTE — Progress Notes (Signed)
Orthopedic Tech Progress Note Patient Details:  Stephen Shepard 1991-02-24 [id number]  Patient ID: [name], male   DOB: 1990/11/09, 31 y.o.   MRN: [id number]  [name] 12/22/2021, 11:00 AM Sling applied to right arm

## 2021-12-22 NOTE — Progress Notes (Signed)
Occupational Therapy Treatment Patient Details Name: Stephen Shepard MRN: [id number] DOB: [birth year] Today's Date: 12/22/2021   History of present illness 31 y.o. male with medical history significant of seizure not on antiepileptics and history of IV heroin drug use who presents with concerns of worsening abscess.   OT comments  Treatment focused on improving ROM of shoulder, elbow and digits. Patient able to don underwear and ambulate in the hall. Improved balance and independence. Has no further needs in regards to ADLs. However he has decreased ROM in digits, elbow and shoulder due to prolonged disuse and sling for comfort. Patient exhibits decreased functional use of right dominant upper extremity. Educated patient on needed to increase ROM of all joints. Patient very scared - of pain and his wound and somewhat resistant. After firm instruction able to get patient to tolerate minimal ROM. He hasn't been taking pain medicine so this is understandable. Instructed patient on how to perform and what exercises to perform once pain medicine on board. He verbalizes understanding. Will continue to follow.   Recommendations for follow up therapy are one component of a multi-disciplinary discharge planning process, led by the attending physician.  Recommendations may be updated based on patient status, additional functional criteria and insurance authorization.    Follow Up Recommendations  Outpatient OT    Assistance Recommended at Discharge PRN  Patient can return home with the following  Assistance with cooking/housework   Equipment Recommendations  None recommended by OT    Recommendations for Other Services      Precautions / Restrictions Precautions Precautions: None Precaution Comments: WBAT, per PA no restrictions for finger ROM Required Braces or Orthoses: Splint/Cast;Sling Splint/Cast: R forearm Restrictions Weight Bearing Restrictions: No       Mobility Bed Mobility Overal bed  mobility: Independent                  Transfers   Equipment used: None Transfers: Sit to/from Stand Sit to Stand: Modified independent (Device/Increase time)                 Balance Overall balance assessment: Mild deficits observed, not formally tested                                         ADL either performed or assessed with clinical judgement   ADL               Lower Body Bathing: Independent           Toilet Transfer: Independent   Toileting- Clothing Manipulation and Hygiene: Independent         General ADL Comments: Reports independence with toileting and has been ambulating wtihout assistance. Demonstrated ability to don underwear.    Extremity/Trunk Assessment              Vision Patient Visual Report: No change from baseline     Perception     Praxis      Cognition Arousal/Alertness: Awake/alert Behavior During Therapy: WFL for tasks assessed/performed Overall Cognitive Status: Within Functional Limits for tasks assessed                                          Exercises Other Exercises Other Exercises: Instructed on need for increased ROM of digits, thumb, elbow and  shoulder. Reports pain with all movement. Other Exercises: Gentle PROM to each digit - patient did not tolerate well due to pain. Other Exercises: Active Assist ROM to shoulder - unable to get to 90 degrees due to patient guarding.    Shoulder Instructions       General Comments      Pertinent Vitals/ Pain       Pain Assessment Pain Assessment: 0-10 Pain Score: 8  Pain Location: shoulder and fingers with ROM and movement Pain Descriptors / Indicators: Grimacing, Guarding Pain Intervention(s): Monitored during session, RN gave pain meds during session  Home Living                                          Prior Functioning/Environment              Frequency  Min 2X/week         Progress Toward Goals  OT Goals(current goals can now be found in the care plan section)  Progress towards OT goals: Progressing toward goals  Acute Rehab OT Goals OT Goal Formulation: With patient/family Time For Goal Achievement: 01/01/22 Potential to Achieve Goals: Good  Plan Discharge plan needs to be updated    Co-evaluation          OT goals addressed during session: Strengthening/ROM;ADL's and self-care      AM-PAC OT "6 Clicks" Daily Activity     Outcome Measure   Help from another person eating meals?: None Help from another person taking care of personal grooming?: None Help from another person toileting, which includes using toliet, bedpan, or urinal?: None Help from another person bathing (including washing, rinsing, drying)?: None Help from another person to put on and taking off regular upper body clothing?: None Help from another person to put on and taking off regular lower body clothing?: None 6 Click Score: 24    End of Session    OT Visit Diagnosis: Unsteadiness on feet (R26.81);Pain;Other symptoms and signs involving cognitive function Pain - part of body: Arm   Activity Tolerance Patient limited by pain   Patient Left in bed   Nurse Communication Patient requests pain meds        Time: 5400-8676 OT Time Calculation (min): 31 min  Charges: OT General Charges $OT Visit: 1 Visit OT Treatments $Therapeutic Exercise: 8-22 mins  Waldron Session, OTR/L Acute Care Rehab Services  Office [phone] Pager: [phone]   Kelli Churn 12/22/2021, 3:15 PM

## 2021-12-23 DIAGNOSIS — B1920 Unspecified viral hepatitis C without hepatic coma: Secondary | ICD-10-CM

## 2021-12-23 LAB — BASIC METABOLIC PANEL
Anion gap: 6 (ref 5–15)
BUN: 22 mg/dL — ABNORMAL HIGH (ref 6–20)
CO2: 28 mmol/L (ref 22–32)
Calcium: 9.2 mg/dL (ref 8.9–10.3)
Chloride: 106 mmol/L (ref 98–111)
Creatinine, Ser: 1.02 mg/dL (ref 0.61–1.24)
GFR, Estimated: >60 mL/min (ref >60)
Glucose, Bld: 127 mg/dL — ABNORMAL HIGH (ref 70–99)
Potassium: 4.5 mmol/L (ref 3.5–5.1)
Sodium: 140 mmol/L (ref 135–145)

## 2021-12-23 LAB — MAGNESIUM: Magnesium: 2.4 mg/dL (ref 1.7–2.4)

## 2021-12-23 MED ORDER — OXYCODONE HCL 5 MG PO TABS
10.0000 mg | ORAL_TABLET | ORAL | Status: DC | PRN
  Administered 2021-12-23 – 2021-12-27 (×12): 10 mg via ORAL
  Filled 2021-12-23 (×12): qty 2

## 2021-12-23 NOTE — Progress Notes (Addendum)
[facility] for Infectious Disease  Date of Admission:  12/14/2021     Abx: 5/21-c micafungin 5/21-c amp-sulbactam  5/21 metronidazole 5/19-21 ceftriaxone 5/17-21 vanc                  Acute hepatitis panel positive for hepatitis c with viral load 1.2 million units   Assessment: 31 yo male with seizure/epilepsi, ivdu admitted initially 5/17 with multiple skin abscesses, found to have c glabrata bsi. Of note recently received amox-clav for sinusitis   He is s/p I&D of right shoulder, right wrist/hand dorsum abscesses and tenosynovectomy of right hand 2nd, 3rd, and 4th dorsal compartments on 5/18   I suspect this is due to skin popping/ivdu, not embolic phenomenom but some concern arises for endocarditis given higher risk and also candida glabrata bsi and recent abx use with augmentin suppressing bcx   5/17 bcx candida glabrata; resulted 5/21 5/18 operative cx strep intermedius, haemophilus parainfluenzae (betalactamase negative), prevotella denticola (beta lactamase positive) 5/21 repeat bcx pending   5/24 tee no vegetation   Back pain but rather unconcerning mri thoracolumbar area     Other issues: Chronic hep c can be further addressed in ID clinic or gi clinic outpatient Syphilis screen negative Hiv and hep b negative  ---------- 5/26 assessment 5/24 tee negative for vegetation Clinically doing much better with improving soft tissue abscesses on amox-clav  No further back pain 5/23 repeat bcx so far ngtd for candida glabrata, on micafungin No visual disturbance  Patient much more cooperative     Plan: F/u 5/23 blood culture; if negative (would give until 5/27 to grow) would finish 14 day course fungemia treatment with micafungin (can use high dose fluconazole if susceptibility showed SDD) Outpatient discussed with patient he'll need to see ophthalmology for a retinal/fundoscopic exam to r/o subclinical candida involvement Continue amox-clav -- plan  2 weeks from 5/18 until 6/06 Follow up with me outpatient for chronic hep c evaluation/treatment on July 11 @ 230 pm Will check genotype hcv here Discussed with primary team   I spent more than 35 minute reviewing data/chart, and coordinating care and >50% direct face to face time providing counseling/discussing diagnostics/treatment plan with patient     Principal Problem:   Abscess of multiple sites Active Problems:   IVDU (intravenous drug user)   Sepsis ([place])   AKI (acute kidney injury) ([place])   Shortness of breath   Back pain   Hepatitis C infection   Chest pain   Prediabetes   Anxiety   Abdominal discomfort   Tobacco abuse   Chronic hepatitis C without hepatic coma (HCC)   Candidemia ([place])   Unsteady gait when walking   Malnutrition of moderate degree   Allergies  Allergen Reactions   Ibuprofen Anaphylaxis   Other Anaphylaxis    Kepra   Wasp Venom Protein Swelling    hives    Scheduled Meds:  amoxicillin-clavulanate  1 tablet Oral Q12H   buprenorphine-naloxone  1 tablet Sublingual BID   enoxaparin (LOVENOX) injection  40 mg Subcutaneous Q24H   feeding supplement  237 mL Oral BID BM   lidocaine  1 patch Transdermal Q24H   mouth rinse  15 mL Mouth Rinse BID   multivitamin with minerals  1 tablet Oral Daily   nicotine  21 mg Transdermal Daily   nutrition supplement (JUVEN)  1 packet Oral BID WC   polyethylene glycol  17 g Oral Daily   thiamine  100  mg Oral Daily   Continuous Infusions:  micafungin [facility]) IV 100 mg (12/23/21 1105)   PRN Meds:.acetaminophen, HYDROmorphone (DILAUDID) injection, hydrOXYzine, naLOXone (NARCAN)  injection, nicotine polacrilex, oxyCODONE   SUBJECTIVE: Feels well No pain No visual change No n/v/diarrhea Afebrile  Tee negative   Review of Systems: ROS All other ROS was negative, except mentioned above     OBJECTIVE: Vitals:   12/22/21 0538 12/22/21 1356 12/22/21 2200 12/23/21 0551  BP: 126/82 125/70 115/70  107/62  Pulse: (!) 50 70 61 64  Resp: 16 16 18 18   Temp: 98 F (36.7 C) 98.1 F (36.7 C) 98.1 F (36.7 C) 97.9 F (36.6 C)  TempSrc: Oral Oral Oral   SpO2: 100% 100% 100% 100%  Weight:      Height:       Body mass index is 18.2 kg/m.  Physical Exam  General/constitutional: no distress, pleasant, conversant, cooperative HEENT: Normocephalic, PER, Conj Clear, EOMI, Oropharynx clear Neck supple CV: rrr no mrg Lungs: clear to auscultation, normal respiratory effort Abd: Soft, Nontender Ext: no edema Skin/msk: right wrist in splint/dressing that is clean/dry; right shoulder dressing clean/dry Neuro: nonfocal    Lab Results Lab Results  Component Value Date   WBC 9.9 12/21/2021   HGB 11.6 (L) 12/21/2021   HCT 35.1 (L) 12/21/2021   MCV 86.9 12/21/2021   PLT 369 12/21/2021    Lab Results  Component Value Date   CREATININE 1.02 12/23/2021   BUN 22 (H) 12/23/2021   NA 140 12/23/2021   K 4.5 12/23/2021   CL 106 12/23/2021   CO2 28 12/23/2021    Lab Results  Component Value Date   ALT 30 12/21/2021   AST 34 12/21/2021   ALKPHOS 46 12/21/2021   BILITOT 0.5 12/21/2021      Microbiology: Recent Results (from the past 240 hour(s))  Blood culture (routine x 2)     Status: Abnormal (Preliminary result)   Collection Time: 12/14/21  5:25 PM   Specimen: BLOOD  Result Value Ref Range Status   Specimen Description   Final    BLOOD LEFT ANTECUBITAL Performed at [facility], [place] [address] [place], [place] [address]    Special Requests   Final    BOTTLES DRAWN AEROBIC AND ANAEROBIC Blood Culture adequate volume Performed at [place] [address], [place], [place] [address]    Culture  Setup Time   Final    YEAST AEROBIC BOTTLE ONLY CRITICAL RESULT CALLED TO, READ BACK BY AND VERIFIED WITH: PHARMD J.LEGGE AT [id number] ON 12/18/2021 BY T.SAAD.    Culture (A)  Final    CANDIDA GLABRATA SENT TO [facility] SUSCEPTIBILITIES TO  FOLLOW Performed at [facility], [place] [address] [place], [place] [address]    Report Status PENDING  Incomplete  Blood Culture ID Panel (Reflexed)     Status: Abnormal   Collection Time: 12/14/21  5:25 PM  Result Value Ref Range Status   Enterococcus faecalis NOT DETECTED NOT DETECTED Final   Enterococcus Faecium NOT DETECTED NOT DETECTED Final   Listeria monocytogenes NOT DETECTED NOT DETECTED Final   Staphylococcus species NOT DETECTED NOT DETECTED Final   Staphylococcus aureus (BCID) NOT DETECTED NOT DETECTED Final   Staphylococcus epidermidis NOT DETECTED NOT DETECTED Final   Staphylococcus lugdunensis NOT DETECTED NOT DETECTED Final   Streptococcus species NOT DETECTED NOT DETECTED Final   Streptococcus agalactiae NOT DETECTED NOT DETECTED Final   Streptococcus pneumoniae NOT DETECTED NOT DETECTED Final  Streptococcus pyogenes NOT DETECTED NOT DETECTED Final   A.calcoaceticus-baumannii NOT DETECTED NOT DETECTED Final   Bacteroides fragilis NOT DETECTED NOT DETECTED Final   Enterobacterales NOT DETECTED NOT DETECTED Final   Enterobacter cloacae complex NOT DETECTED NOT DETECTED Final   Escherichia coli NOT DETECTED NOT DETECTED Final   Klebsiella aerogenes NOT DETECTED NOT DETECTED Final   Klebsiella oxytoca NOT DETECTED NOT DETECTED Final   Klebsiella pneumoniae NOT DETECTED NOT DETECTED Final   Proteus species NOT DETECTED NOT DETECTED Final   Salmonella species NOT DETECTED NOT DETECTED Final   Serratia marcescens NOT DETECTED NOT DETECTED Final   Haemophilus influenzae NOT DETECTED NOT DETECTED Final   Neisseria meningitidis NOT DETECTED NOT DETECTED Final   Pseudomonas aeruginosa NOT DETECTED NOT DETECTED Final   Stenotrophomonas maltophilia NOT DETECTED NOT DETECTED Final   Candida albicans NOT DETECTED NOT DETECTED Final   Candida auris NOT DETECTED NOT DETECTED Final   Candida glabrata DETECTED (A) NOT DETECTED Final    Comment: CRITICAL RESULT CALLED TO, READ  BACK BY AND VERIFIED WITH: PHARMD J.LEGGE AT 1721 ON 12/18/2021 BY T.SAAD.    Candida krusei NOT DETECTED NOT DETECTED Final   Candida parapsilosis NOT DETECTED NOT DETECTED Final   Candida tropicalis NOT DETECTED NOT DETECTED Final   Cryptococcus neoformans/gattii NOT DETECTED NOT DETECTED Final    Comment: Performed at [facility], [place] [address], [place], [place] [address]  Antifungal AST 9 Drug Panel     Status: None (Preliminary result)   Collection Time: 12/14/21  5:25 PM  Result Value Ref Range Status   Organism ID, Yeast Preliminary report  Final    Comment: (NOTE) Specimen has been received and testing has been initiated. Performed At: [facility] [place], [place] [id number] Rush Farmer MD [id number]    Amphotericin B MIC PENDING  Incomplete   Anidulafungin MIC PENDING  Incomplete   Caspofungin MIC PENDING  Incomplete   Micafungin MIC PENDING  Incomplete   Posaconazole MIC PENDING  Incomplete   Fluconazole Islt MIC PENDING  Incomplete   Flucytosine MIC PENDING  Incomplete   Itraconazole MIC PENDING  Incomplete   Voriconazole MIC PENDING  Incomplete   Source [id number] BLD C GLABRATA  Final    Comment: Performed at [facility], [place]. [address], [place], [place] [address]  Blood culture (routine x 2)     Status: None   Collection Time: 12/14/21 10:30 PM   Specimen: BLOOD  Result Value Ref Range Status   Specimen Description   Final    BLOOD LEFT ARM Performed at [place] [address], [place], [place] [address]    Special Requests   Final    BOTTLES DRAWN AEROBIC AND ANAEROBIC Blood Culture adequate volume Performed at [place] [address] [place], [place] [address]    Culture   Final    NO GROWTH 5 DAYS Performed at [facility], [place] [address], [place], [place] [address]    Report Status 12/20/2021 FINAL  Final  Resp Panel by RT-PCR (Flu A&B, Covid)     Status:  None   Collection Time: 12/14/21 10:30 PM   Specimen: Nasopharyngeal(NP) swabs in vial transport medium  Result Value Ref Range Status   SARS Coronavirus 2 by RT PCR NEGATIVE NEGATIVE Final    Comment: (NOTE) SARS-CoV-2 target nucleic acids are NOT DETECTED.  The SARS-CoV-2 RNA is generally detectable in upper respiratory specimens during the  acute phase of infection. The lowest concentration of SARS-CoV-2 viral copies this assay can detect is 138 copies/mL. A negative result does not preclude SARS-Cov-2 infection and should not be used as the sole basis for treatment or other patient management decisions. A negative result may occur with  improper specimen collection/handling, submission of specimen other than nasopharyngeal swab, presence of viral mutation(s) within the areas targeted by this assay, and inadequate number of viral copies(<138 copies/mL). A negative result must be combined with clinical observations, patient history, and epidemiological information. The expected result is Negative.  Fact Sheet for Patients:  EntrepreneurPulse.com.au  Fact Sheet for Healthcare Providers:  IncredibleEmployment.be  This test is no t yet approved or cleared by the [place] FDA and  has been authorized for detection and/or diagnosis of SARS-CoV-2 by FDA under an Emergency Use Authorization (EUA). This EUA will remain  in effect (meaning this test can be used) for the duration of the COVID-19 declaration under Section 564(b)(1) of the Act, 21 U.S.C.section 360bbb-3(b)(1), unless the authorization is terminated  or revoked sooner.       Influenza A by PCR NEGATIVE NEGATIVE Final   Influenza B by PCR NEGATIVE NEGATIVE Final    Comment: (NOTE) The Xpert Xpress SARS-CoV-2/FLU/RSV plus assay is intended as an aid in the diagnosis of influenza from Nasopharyngeal swab specimens and should not be used as a sole basis for treatment. Nasal washings  and aspirates are unacceptable for Xpert Xpress SARS-CoV-2/FLU/RSV testing.  Fact Sheet for Patients: EntrepreneurPulse.com.au  Fact Sheet for Healthcare Providers: IncredibleEmployment.be  This test is not yet approved or cleared by the [place] FDA and has been authorized for detection and/or diagnosis of SARS-CoV-2 by FDA under an Emergency Use Authorization (EUA). This EUA will remain in effect (meaning this test can be used) for the duration of the COVID-19 declaration under Section 564(b)(1) of the Act, 21 U.S.C. section 360bbb-3(b)(1), unless the authorization is terminated or revoked.  Performed at [facility], [place] [address], [place], [place] [address]   Surgical PCR screen     Status: None   Collection Time: 12/15/21 12:58 PM   Specimen: Nasal Mucosa; Nasal Swab  Result Value Ref Range Status   MRSA, PCR NEGATIVE NEGATIVE Final   Staphylococcus aureus NEGATIVE NEGATIVE Final    Comment: (NOTE) The Xpert SA Assay (FDA approved for NASAL specimens in patients 66 years of age and older), is one component of a comprehensive surveillance program. It is not intended to diagnose infection nor to guide or monitor treatment. Performed at [facility], [place] [address], [place], [place] [address]   Aerobic/Anaerobic Culture w Gram Stain (surgical/deep wound)     Status: None   Collection Time: 12/15/21  5:06 PM   Specimen: PATH Other; Tissue  Result Value Ref Range Status   Specimen Description   Final    TISSUE Performed at [place] [address], [place], [place] [address]    Special Requests   Final    NONE SHOULDER ABSCESS Performed at [facility], [place] [address], [place], [place] [address]    Gram Stain   Final    ABUNDANT WBC PRESENT, PREDOMINANTLY PMN FEW GRAM POSITIVE COCCI    Culture   Final    ABUNDANT STREPTOCOCCUS INTERMEDIUS NO  ANAEROBES ISOLATED Performed at [facility], [place] [address] [place], [place] [address]    Report Status 12/20/2021 FINAL  Final   Organism ID, Bacteria STREPTOCOCCUS  INTERMEDIUS  Final      Susceptibility   Streptococcus intermedius - MIC*    PENICILLIN <=0.06 SENSITIVE Sensitive     CEFTRIAXONE <=0.12 SENSITIVE Sensitive     ERYTHROMYCIN 4 RESISTANT Resistant     LEVOFLOXACIN <=0.25 SENSITIVE Sensitive     VANCOMYCIN 0.25 SENSITIVE Sensitive     * ABUNDANT STREPTOCOCCUS INTERMEDIUS  Aerobic/Anaerobic Culture w Gram Stain (surgical/deep wound)     Status: None   Collection Time: 12/15/21  5:06 PM   Specimen: PATH Other; Tissue  Result Value Ref Range Status   Specimen Description   Final    TISSUE Performed at [place] [address] [place], [place] [address]    Special Requests   Final    NONE HAND ABSCESS Performed at [facility], [place] [address], [place], [place] [address]    Gram Stain   Final    ABUNDANT WBC PRESENT, PREDOMINANTLY PMN FEW GRAM POSITIVE COCCI RARE GRAM NEGATIVE RODS    Culture   Final    ABUNDANT STREPTOCOCCUS INTERMEDIUS FEW HAEMOPHILUS PARAINFLUENZAE BETA LACTAMASE NEGATIVE ABUNDANT PREVOTELLA DENTICOLA BETA LACTAMASE POSITIVE Performed at [facility], [address] [place], [place] [address]    Report Status 12/18/2021 FINAL  Final   Organism ID, Bacteria STREPTOCOCCUS INTERMEDIUS  Final      Susceptibility   Streptococcus intermedius - MIC*    PENICILLIN <=0.06 SENSITIVE Sensitive     CEFTRIAXONE <=0.12 SENSITIVE Sensitive     ERYTHROMYCIN 4 RESISTANT Resistant     LEVOFLOXACIN <=0.25 SENSITIVE Sensitive     VANCOMYCIN 0.25 SENSITIVE Sensitive     * ABUNDANT STREPTOCOCCUS INTERMEDIUS  Urine Culture     Status: None   Collection Time: 12/17/21  8:07 AM   Specimen: Urine, Clean Catch  Result Value Ref Range Status   Specimen Description   Final    URINE, CLEAN CATCH Performed at  [facility], [place] [address] [place], [place] [address]    Special Requests   Final    NONE Performed at [facility], [place] [address], [place], [place] [address]    Culture   Final    NO GROWTH Performed at [facility], [place] [address], [place], [place] [address]    Report Status 12/18/2021 FINAL  Final  Culture, blood (Routine X 2) w Reflex to ID Panel     Status: None (Preliminary result)   Collection Time: 12/20/21  3:47 PM   Specimen: BLOOD  Result Value Ref Range Status   Specimen Description   Final    BLOOD RIGHT ANTECUBITAL Performed at [place] [address] [place], [place] [address]    Special Requests   Final    IN PEDIATRIC BOTTLE Blood Culture adequate volume Performed at [place] [address], [place], [place] [address]    Culture   Final    NO GROWTH 3 DAYS Performed at [facility], [place] [address], [place],  [address]    Report Status PENDING  Incomplete  Culture, blood (Routine X 2) w Reflex to ID Panel     Status: None (Preliminary result)   Collection Time: 12/20/21  3:47 PM   Specimen: BLOOD  Result Value Ref Range Status   Specimen Description   Final    BLOOD RIGHT ANTECUBITAL LOWER Performed at [place] [address] [place],  [address]    Special Requests  Final    IN PEDIATRIC BOTTLE Blood Culture adequate volume Performed at [place] [address] [place], [place] [address]    Culture   Final    NO GROWTH 3 DAYS Performed at [facility], [place] [address] [place], [place] [address]    Report Status PENDING  Incomplete     Serology:   Imaging: If present, new imagings (plain films, ct scans, and mri) have been personally visualized and interpreted; radiology reports have been reviewed. Decision making incorporated into the Impression / Recommendations.   5/18 ct  abd/pelv with contrast 1. Mild dependent bibasilar airspace opacities with associated volume loss favored to reflect atelectasis. Small volume aspiration is a consideration in the appropriate clinical context. 2. Otherwise, no acute cardiopulmonary process. 3. No evidence of fracture, malalignment or destructive process within the thoracic spine.   5/20 mri thoracolumbar spine 1. No evidence of discitis-osteomyelitis or epidural abscess. 2. Small left subarticular disc protrusion at T6-7 without spinal canal stenosis. 3. Small left subarticular disc protrusion at L4-5 mildly narrowing the left lateral recess. Otherwise normal thoracic and lumbar spine.  5/22 tte   1. Left ventricular ejection fraction, by estimation, is 55 to 60%. The  left ventricle has normal function. The left ventricle has no regional  wall motion abnormalities. Left ventricular diastolic parameters are  consistent with Grade I diastolic  dysfunction (impaired relaxation). The average left ventricular global  longitudinal strain is -16.5 %. The global longitudinal strain is  abnormal.   2. Right ventricular systolic function is normal. The right ventricular  size is normal.   3. The mitral valve is normal in structure. Trivial mitral valve  regurgitation.   4. The aortic valve is tricuspid. Aortic valve regurgitation is not  visualized. No aortic stenosis is present.   5. The inferior vena cava is dilated in size with <50% respiratory  variability, suggesting right atrial pressure of 15 mmHg.   Comparison(s): No prior Echocardiogram.   Conclusion(s)/Recommendation(s): No evidence of valvular vegetations on this transthoracic echocardiogram. Consider a transesophageal  echocardiogram to exclude infective endocarditis if clinically indicated.     5/24 tee No vegetation    Jabier Mutton, [facility] [place] [phone] pager    12/23/2021, 12:05 PM

## 2021-12-23 NOTE — Progress Notes (Signed)
Physical Therapy Discharge Patient Details Name: Stephen Shepard MRN: [id number] DOB: Mar 17, 1991 Today's Date: 12/23/2021 Time:  -     Patient discharged from PT services secondary to goals met and no further PT needs identified.  Please see latest therapy progress note for current level of functioning and progress toward goals.  Patient observed independently ambulating in hall without any balance deficits.  Progress and discharge plan discussed with patient and/or caregiver: Patient/Caregiver agrees with plan  GP     Claretha Cooper 12/23/2021, 10:27 AM  [place] Pager [phone] Office [phone]

## 2021-12-23 NOTE — Progress Notes (Signed)
PROGRESS NOTE    Stephen Shepard  PPI:[id number] DOB: [birth year] DOA: 12/14/2021 PCP: Patient, [name] (Inactive)     Brief Narrative:   H/o  seizure not on antiepileptics and history of IV heroin drug use who presents with concerns of worsening abscess.  He's now s/p surgery with orthopedics/hand.  Culture directed abx.  Blood cultures with candida glabrata, ID consulted and workup underway.   Subjective:  No acute interval changes Ambulating , pleasant today No fever  Assessment & Plan:  Principal Problem:   Abscess of multiple sites Active Problems:   Candidemia (HCC)   Anxiety   Shortness of breath   IVDU (intravenous drug user)   Back pain   Unsteady gait when walking   AKI (acute kidney injury) (HCC)   Abdominal discomfort   Hepatitis C infection   Chest pain   Tobacco abuse   Prediabetes   Sepsis (HCC)   Chronic hepatitis C without hepatic coma (HCC)   Malnutrition of moderate degree    Assessment and Plan:   Sepsis/ Abscess of multiple sites, -POA  - S/p I/D right shoulder subcutaneous abscess on 5/18, Recommending daily packing changes to wound on shoulder and dry dressing on incision, surgical cultures from right shoulder from 5/18 +STREPTOCOCCUS INTERMEDIUS -s/p I/D deep abscess R wrist and dorsum of right hand on 5/18, For R wrist and forearm, recommending keep splint in place , does not need to change right hand dressing unless soiled per ortho (A xeroform or petroleum gauze over incision then dry gauze over that with splint and ACE),  right hand surgical cultures from 5/18 appear to to be polymicrobial but predominantly Streptococcus intermedius, detail please see original report - Ortho made aware patient will stay in hospital to receive antibiotic and antifungal treatment at least for another week or so ,need to  f/u with Dr. Melvyn Novas  -Several other abscesses  appear to have spontaneously drained.  -TEE on 5/24 negative for vegetation -Currently on  Augmentin -Antibiotic adjustment per ID, will follow ID recommendation    candida fungemia , POA -Blood cx obtained on admission 5/17 growing candida glabrata, Candida 9 panel pending -Repeat blood cultures 5/23 no growth so far -Currently on micafungin -TEE no vegetation -Denies vision problem, ID recommend outpatient follow-up with ophthalmology - Appreciate ID recs, will follow recommendation   Anxiety Paranoid, anxious at times - his mom died in the hospital within past few years ? Component of withdrawal - see below Repeat utox (opiates, benzo, thc) Still refusing medication (such as  Lovenox , stool softeners )intermittently, but compliance with antibiotic antifungal regimen This morning he is pleasant  Shortness of breath/chest pain Negative troponin TTE/TEE unremarkable CT PE protocol with mild dependent bibasilar airspace opacities with associated volume loss favored to reflect atelectasis, small volume aspiration is a consideration follow  Unsteady gait when walking gradually improving, mental status improved as well. Improving, ambulating with steady gait in hallway calls every day spam call us  Back pain C/o thoracic back pain Plain films unremarkable CT t spine without evidence of fracture, malalignment, or destructive process within thoracic spine MRI T/L spine without evidence of infection No LE weakness or saddle anesthesia Improved with lidocaine patch   IVDU (intravenous drug user) Reports last heroin use a month ago -negative HIV -follow RPR, treponemal ab -follow acute hepatitis panel -> positive hep C ab Started on suboxone 5/20.  Will need to plan for discharge, taper vs follow outpatient with prescriber.    Abdominal discomfort  resolved  AKI (acute kidney injury) (HCC) Resolved, follow  Hepatitis C infection viral load 1.2 million units Genotyping per ID Follow ID outpatient  Tobacco abuse Nicotine patch    Prediabetes A1c 6 Am  blood glucose 83   The patient's BMI is: Body mass index is 18.2 kg/m.Marland Kitchen Seen by dietician.  I agree with the assessment and plan as outlined below: Nutrition Status: Nutrition Problem: Moderate Malnutrition Etiology: chronic illness (substance abuse) Signs/Symptoms: moderate fat depletion, moderate muscle depletionInterventions: Ensure Enlive (each supplement provides 350kcal and 20 grams of protein), MVI, Juven, Education  .  I have Reviewed nursing notes, Vitals, pain scores, I/o's, Lab results and  imaging results since pt's last encounter, details please see discussion above  I ordered the following labs:  Unresulted Labs (From admission, onward)     Start     Ordered   12/24/21 0500  HCV RNA quant rflx ultra or genotyp  Tomorrow morning,   R       Question:  Specimen collection method  Answer:  IV Team=IV Team collect   12/23/21 1211   12/23/21 0500  Basic metabolic panel  Daily,   R     Question:  Specimen collection method  Answer:  IV Team=IV Team collect   12/22/21 1557             DVT prophylaxis: enoxaparin (LOVENOX) injection 40 mg Start: 12/20/21 1000 SCD's Start: 12/15/21 1819 SCDs Start: 12/14/21 2335   Code Status:   Code Status: Full Code  Family Communication: patient Disposition:   Status is: Inpatient   Dispo: The patient is from: home               Anticipated d/c is to: TBD, need ID clearance              Anticipated d/c date is: TBD, need ID clearance,  ortho need to see again as he will remain in the hospital  Antimicrobials:   Anti-infectives (From admission, onward)    Start     Dose/Rate Route Frequency Ordered Stop   12/22/21 1400  amoxicillin-clavulanate (AUGMENTIN) 875-125 MG per tablet 1 tablet        1 tablet Oral Every 12 hours 12/22/21 1033 12/29/21 2359   12/19/21 2200  Ampicillin-Sulbactam (UNASYN) 3 g in sodium chloride 0.9 % 100 mL IVPB  Status:  Discontinued        3 g 200 mL/hr over 30 Minutes Intravenous Every 6 hours  12/19/21 1256 12/22/21 1033   12/19/21 2200  metroNIDAZOLE (FLAGYL) tablet 500 mg  Status:  Discontinued        500 mg Oral Every 12 hours 12/19/21 1240 12/19/21 1256   12/18/21 1800  micafungin (MYCAMINE) 100 mg in sodium chloride 0.9 % 100 mL IVPB        100 mg 110 mL/hr over 1 Hours Intravenous Daily 12/18/21 1557 01/02/22 2359   12/18/21 1800  metroNIDAZOLE (FLAGYL) tablet 500 mg  Status:  Discontinued        500 mg Oral Every 8 hours 12/18/21 1559 12/19/21 1240   12/16/21 1000  cefTRIAXone (ROCEPHIN) 1 g in sodium chloride 0.9 % 100 mL IVPB  Status:  Discontinued        1 g 200 mL/hr over 30 Minutes Intravenous Every 24 hours 12/16/21 0817 12/19/21 1256   12/15/21 2000  vancomycin (VANCOREADY) IVPB 1750 mg/350 mL  Status:  Discontinued        1,750 mg 175 mL/hr over 120 Minutes  Intravenous Every 24 hours 12/14/21 2351 12/15/21 0953   12/15/21 1215  ceFAZolin (ANCEF) IVPB 2g/100 mL premix        2 g 200 mL/hr over 30 Minutes Intravenous On call to O.R. 12/15/21 1206 12/15/21 1635   12/15/21 1000  vancomycin (VANCOREADY) IVPB 1250 mg/250 mL  Status:  Discontinued        1,250 mg 166.7 mL/hr over 90 Minutes Intravenous Every 12 hours 12/15/21 0954 12/18/21 1221   12/14/21 2345  azithromycin (ZITHROMAX) 500 mg in sodium chloride 0.9 % 250 mL IVPB  Status:  Discontinued        500 mg 250 mL/hr over 60 Minutes Intravenous Every 24 hours 12/14/21 2337 12/16/21 1901   12/14/21 1930  vancomycin (VANCOCIN) IVPB 1000 mg/200 mL premix        1,000 mg 200 mL/hr over 60 Minutes Intravenous  Once 12/14/21 1920 12/14/21 2352           Objective: Vitals:   12/22/21 0538 12/22/21 1356 12/22/21 2200 12/23/21 0551  BP: 126/82 125/70 115/70 107/62  Pulse: (!) 50 70 61 64  Resp: 16 16 18 18   Temp: 98 F (36.7 C) 98.1 F (36.7 C) 98.1 F (36.7 C) 97.9 F (36.6 C)  TempSrc: Oral Oral Oral   SpO2: 100% 100% 100% 100%  Weight:      Height:       No intake or output data in the 24 hours  ending 12/23/21 1508  Filed Weights   12/14/21 1622 12/15/21 1207 12/21/21 1224  Weight: 74.8 kg 64.3 kg 64.3 kg    Examination:  General exam: aaox3, NAD, pleasant Respiratory system: Clear to auscultation. Respiratory effort normal. Cardiovascular system:  RRR.  Gastrointestinal system: Abdomen is nondistended, soft and nontender.  Normal bowel sounds heard. Central nervous system: Alert and oriented. No focal neurological deficits. Extremities:  multiple wounds Skin: multiple wounds Psychiatry: Pleasant, calm and cooperative this morning    Data Reviewed: I have personally reviewed  labs and visualized  imaging studies since the last encounter and formulate the plan        Scheduled Meds:  amoxicillin-clavulanate  1 tablet Oral Q12H   buprenorphine-naloxone  1 tablet Sublingual BID   enoxaparin (LOVENOX) injection  40 mg Subcutaneous Q24H   feeding supplement  237 mL Oral BID BM   lidocaine  1 patch Transdermal Q24H   mouth rinse  15 mL Mouth Rinse BID   multivitamin with minerals  1 tablet Oral Daily   nicotine  21 mg Transdermal Daily   nutrition supplement (JUVEN)  1 packet Oral BID WC   polyethylene glycol  17 g Oral Daily   thiamine  100 mg Oral Daily   Continuous Infusions:  micafungin (MYCAMINE) IV 100 mg (12/23/21 1105)     LOS: 9 days     Albertine GratesFang Cloria Ciresi, MD PhD FACP [facility]  Available via Epic secure chat 7am-7pm for nonurgent issues Please page for urgent issues To page the attending provider between 7A-7P or the covering provider during after hours 7P-7A, please log into the web site [email or web address] and access using universal [place] password for that web site. If you do not have the password, please call the hospital operator.    12/23/2021, 3:08 PM

## 2021-12-24 ENCOUNTER — Other Ambulatory Visit: Payer: Self-pay

## 2021-12-24 LAB — BASIC METABOLIC PANEL
Anion gap: 7 (ref 5–15)
BUN: 22 mg/dL — ABNORMAL HIGH (ref 6–20)
CO2: 29 mmol/L (ref 22–32)
Calcium: 9.6 mg/dL (ref 8.9–10.3)
Chloride: 102 mmol/L (ref 98–111)
Creatinine, Ser: 0.87 mg/dL (ref 0.61–1.24)
GFR, Estimated: >60 mL/min (ref >60)
Glucose, Bld: 95 mg/dL (ref 70–99)
Potassium: 4.3 mmol/L (ref 3.5–5.1)
Sodium: 138 mmol/L (ref 135–145)

## 2021-12-24 NOTE — Progress Notes (Addendum)
PROGRESS NOTE    Stephen Shepard  [id number] DOB: 06-08-1991 DOA: 12/14/2021 PCP: Patient, [name] (Inactive)     Brief Narrative:   H/o  seizure not on antiepileptics and history of IV heroin drug use who presents with concerns of worsening abscess.  He's now s/p surgery with orthopedics/hand.  Culture directed abx.  Blood cultures with candida glabrata, ID consulted and workup underway.   Subjective:  No acute interval changes No fever Denies pain, reports he is steady and walking in the hallway  Assessment & Plan:  Principal Problem:   Abscess of multiple sites Active Problems:   Candidemia ([place])   Anxiety   Shortness of breath   IVDU (intravenous drug user)   Back pain   Unsteady gait when walking   AKI (acute kidney injury) ([place])   Abdominal discomfort   Hepatitis C infection   Chest pain   Tobacco abuse   Prediabetes   Sepsis ([place])   Chronic hepatitis C without hepatic coma (HCC)   Malnutrition of moderate degree    Assessment and Plan:   Sepsis/ Abscess of multiple sites, -POA  - S/p I/D  subcutaneous abscess on right shoulder on 5/18, Recommending daily packing changes to wound on shoulder and dry dressing on incision, surgical cultures from right shoulder from 5/18 +STREPTOCOCCUS INTERMEDIUS -s/p I/D deep abscess R wrist and dorsum of right hand on 5/18, For R wrist and forearm, recommending keep splint in place , does not need to change right hand dressing unless soiled per ortho (A xeroform or petroleum gauze over incision then dry gauze over that with splint and ACE),  right hand surgical cultures from 5/18 appear to to be polymicrobial but predominantly Streptococcus intermedius, detail please see original report - Ortho made aware patient will stay in hospital to receive antibiotic and antifungal treatment at least for another week or so ,need to  f/u with Dr. Caralyn Guile  -Several other abscesses appear to have spontaneously drained.  -TEE on 5/24  negative for vegetation -Currently on Augmentin ( plan to finish 2 weeks amox-clav starting 5/18 to 6/6 for his abscesses) -Antibiotic adjustment per ID, will follow ID recommendation    candida fungemia , POA -Blood cx obtained on admission 5/17 growing candida glabrata, Candida 9 panel pending  -Repeat blood cultures 5/23 no growth so far -Currently on micafungin , plan 2 weeks antifungal from 5/23 (if that remains negative), or maybe able to  adjust micafungin to fluconazole pending candida susceptibility , ID will decide  -TEE no vegetation -Denies vision problem, ID recommend outpatient follow-up with ophthalmology - Appreciate ID recs, will follow recommendation   Anxiety Paranoid, anxious at times - his mom died in the hospital within past few years ? Component of withdrawal , Repeat utox (opiates, benzo, thc) Still refusing medication (such as  Lovenox , stool softeners )intermittently, but compliance with antibiotic antifungal regimen   Shortness of breath/chest pain Negative troponin TTE/TEE unremarkable CT PE protocol with mild dependent bibasilar airspace opacities with associated volume loss favored to reflect atelectasis, small volume aspiration is a consideration Resolved   Unsteady gait when walking gradually improving, mental status improved as well. Resolved  ambulating with steady gait in hallway  Back pain C/o thoracic back pain Plain films unremarkable CT t spine without evidence of fracture, malalignment, or destructive process within thoracic spine MRI T/L spine without evidence of infection No LE weakness or saddle anesthesia Improved with lidocaine patch   IVDU (intravenous drug user) Reports last heroin  use a month ago -negative HIV -follow RPR, treponemal ab -follow acute hepatitis panel -> positive hep C ab Started on suboxone 5/20.  Will need to plan for discharge, taper vs follow outpatient with prescriber.    Abdominal  discomfort resolved  AKI (acute kidney injury) ([place]) Resolved, follow  Hepatitis C infection viral load 1.2 million units Genotyping per ID Follow ID outpatient in July  Tobacco abuse Nicotine patch    Prediabetes A1c 6 Am blood glucose 83   The patient's BMI is: Body mass index is 18.2 kg/m.Marland Kitchen Seen by dietician.  I agree with the assessment and plan as outlined below: Nutrition Status: Nutrition Problem: Moderate Malnutrition Etiology: chronic illness (substance abuse) Signs/Symptoms: moderate fat depletion, moderate muscle depletionInterventions: Ensure Enlive (each supplement provides 350kcal and 20 grams of protein), MVI, Juven, Education  .  I have Reviewed nursing notes, Vitals, pain scores, I/o's, Lab results and  imaging results since pt's last encounter, details please see discussion above  I ordered the following labs:  Unresulted Labs (From admission, onward)     Start     Ordered   12/24/21 0500  HCV RNA quant rflx ultra or genotyp  Tomorrow morning,   R       Question:  Specimen collection method  Answer:  IV Team=IV Team collect   12/23/21 1211   12/23/21 [id number]  Basic metabolic panel  Daily,   R     Question:  Specimen collection method  Answer:  IV Team=IV Team collect   12/22/21 1557             DVT prophylaxis: enoxaparin (LOVENOX) injection 40 mg Start: 12/20/21 1000 SCD's Start: 12/15/21 1819 SCDs Start: 12/14/21 2335   Code Status:   Code Status: Full Code  Family Communication: patient Disposition:   Status is: Inpatient   Dispo: The patient is from: home               Anticipated d/c is to: TBD, need ID clearance              Anticipated d/c date is: TBD, need ID clearance,  ortho need to see again as he will remain in the hospital  Antimicrobials:   Anti-infectives (From admission, onward)    Start     Dose/Rate Route Frequency Ordered Stop   12/22/21 1400  amoxicillin-clavulanate (AUGMENTIN) 875-125 MG per tablet 1 tablet         1 tablet Oral Every 12 hours 12/22/21 1033 12/29/21 2359   12/19/21 2200  Ampicillin-Sulbactam (UNASYN) 3 g in sodium chloride 0.9 % 100 mL IVPB  Status:  Discontinued        3 g 200 mL/hr over 30 Minutes Intravenous Every 6 hours 12/19/21 1256 12/22/21 1033   12/19/21 2200  metroNIDAZOLE (FLAGYL) tablet 500 mg  Status:  Discontinued        500 mg Oral Every 12 hours 12/19/21 1240 12/19/21 1256   12/18/21 1800  micafungin (MYCAMINE) 100 mg in sodium chloride 0.9 % 100 mL IVPB        100 mg 110 mL/hr over 1 Hours Intravenous Daily 12/18/21 1557 01/03/22 2359   12/18/21 1800  metroNIDAZOLE (FLAGYL) tablet 500 mg  Status:  Discontinued        500 mg Oral Every 8 hours 12/18/21 1559 12/19/21 1240   12/16/21 1000  cefTRIAXone (ROCEPHIN) 1 g in sodium chloride 0.9 % 100 mL IVPB  Status:  Discontinued        1  g 200 mL/hr over 30 Minutes Intravenous Every 24 hours 12/16/21 0817 12/19/21 1256   12/15/21 2000  vancomycin (VANCOREADY) IVPB 1750 mg/350 mL  Status:  Discontinued        1,750 mg 175 mL/hr over 120 Minutes Intravenous Every 24 hours 12/14/21 2351 12/15/21 0953   12/15/21 1215  ceFAZolin (ANCEF) IVPB 2g/100 mL premix        2 g 200 mL/hr over 30 Minutes Intravenous On call to O.R. 12/15/21 1206 12/15/21 1635   12/15/21 1000  vancomycin (VANCOREADY) IVPB 1250 mg/250 mL  Status:  Discontinued        1,250 mg 166.7 mL/hr over 90 Minutes Intravenous Every 12 hours 12/15/21 0954 12/18/21 1221   12/14/21 2345  azithromycin (ZITHROMAX) 500 mg in sodium chloride 0.9 % 250 mL IVPB  Status:  Discontinued        500 mg 250 mL/hr over 60 Minutes Intravenous Every 24 hours 12/14/21 2337 12/16/21 1901   12/14/21 1930  vancomycin (VANCOCIN) IVPB 1000 mg/200 mL premix        1,000 mg 200 mL/hr over 60 Minutes Intravenous  Once 12/14/21 1920 12/14/21 2352           Objective: Vitals:   12/23/21 0551 12/23/21 1654 12/23/21 2200 12/24/21 0528  BP: 107/62 99/61 110/62 115/74  Pulse: 64 67  (!) 59 (!) 52  Resp: 18 20 16    Temp: 97.9 F (36.6 C) 98.5 F (36.9 C) 98.9 F (37.2 C) 98.4 F (36.9 C)  TempSrc:  Oral Oral Oral  SpO2: 100% 100% 98% 100%  Weight:      Height:        Intake/Output Summary (Last 24 hours) at 12/24/2021 1452 Last data filed at 12/24/2021 [id number] Gross per 24 hour  Intake 240 ml  Output --  Net 240 ml    Filed Weights   12/14/21 1622 12/15/21 1207 12/21/21 1224  Weight: 74.8 kg 64.3 kg 64.3 kg    Examination:  General exam: aaox3, NAD, pleasant Respiratory system: Clear to auscultation. Respiratory effort normal. Cardiovascular system:  RRR.  Gastrointestinal system: Abdomen is nondistended, soft and nontender.  Normal bowel sounds heard. Central nervous system: Alert and oriented. No focal neurological deficits. Extremities:  multiple wounds, right hand splint and Ace wrap  Skin: multiple wounds Psychiatry: Pleasant, calm and cooperative this morning    Data Reviewed: I have personally reviewed  labs and visualized  imaging studies since the last encounter and formulate the plan        Scheduled Meds:  amoxicillin-clavulanate  1 tablet Oral Q12H   buprenorphine-naloxone  1 tablet Sublingual BID   enoxaparin (LOVENOX) injection  40 mg Subcutaneous Q24H   feeding supplement  237 mL Oral BID BM   lidocaine  1 patch Transdermal Q24H   mouth rinse  15 mL Mouth Rinse BID   multivitamin with minerals  1 tablet Oral Daily   nicotine  21 mg Transdermal Daily   nutrition supplement (JUVEN)  1 packet Oral BID WC   polyethylene glycol  17 g Oral Daily   thiamine  100 mg Oral Daily   Continuous Infusions:  micafungin (MYCAMINE) IV 100 mg (12/24/21 1047)     LOS: 10 days     Florencia Reasons, MD PhD FACP [facility]  Available via Epic secure chat 7am-7pm for nonurgent issues Please page for urgent issues To page the attending provider between 7A-7P or the covering provider during after hours 7P-7A, please log into the web  site  [email or web address] and access using universal [place] password for that web site. If you do not have the password, please call the hospital operator.    12/24/2021, 2:52 PM

## 2021-12-25 LAB — BASIC METABOLIC PANEL
Anion gap: 10 (ref 5–15)
BUN: 27 mg/dL — ABNORMAL HIGH (ref 6–20)
CO2: 25 mmol/L (ref 22–32)
Calcium: 9.6 mg/dL (ref 8.9–10.3)
Chloride: 100 mmol/L (ref 98–111)
Creatinine, Ser: 0.81 mg/dL (ref 0.61–1.24)
GFR, Estimated: >60 mL/min (ref >60)
Glucose, Bld: 110 mg/dL — ABNORMAL HIGH (ref 70–99)
Potassium: 4.5 mmol/L (ref 3.5–5.1)
Sodium: 135 mmol/L (ref 135–145)

## 2021-12-25 LAB — CULTURE, BLOOD (ROUTINE X 2)
Culture: NO GROWTH
Culture: NO GROWTH
Special Requests: ADEQUATE
Special Requests: ADEQUATE

## 2021-12-25 MED ORDER — POLYETHYLENE GLYCOL 3350 17 G PO PACK
17.0000 g | PACK | Freq: Every day | ORAL | Status: DC | PRN
Start: 2021-12-25 — End: 2021-12-28

## 2021-12-25 NOTE — Progress Notes (Signed)
PROGRESS NOTE    Stephen Shepard  XBM:[id number] DOB: 06-28-1991 DOA: 12/14/2021 PCP: Patient, [name] (Inactive)     Brief Narrative:   H/o  seizure not on antiepileptics and history of IV heroin drug use who presents with concerns of worsening abscess.  He's now s/p surgery with orthopedics/hand.  Culture directed abx.  Blood cultures with candida glabrata, ID consulted and workup underway.   Subjective:  Reports right shoulder limited range of motion with pain No fever Denies pain, reports he is steady and walking in the hallway Significant other at bedside   Assessment & Plan:  Principal Problem:   Abscess of multiple sites Active Problems:   Candidemia (HCC)   Anxiety   Shortness of breath   IVDU (intravenous drug user)   Back pain   Unsteady gait when walking   AKI (acute kidney injury) (HCC)   Abdominal discomfort   Hepatitis C infection   Chest pain   Tobacco abuse   Prediabetes   Sepsis (HCC)   Chronic hepatitis C without hepatic coma (HCC)   Malnutrition of moderate degree    Assessment and Plan:   Sepsis/ Abscess of multiple sites, -POA  - S/p I/D  subcutaneous abscess on right upper arm close to right  shoulder on 5/18    Wound care: daily packing changes to wound on upper arm and dry dressing on incision    surgical cultures from right shoulder subcutaneous abscess from 5/18 +STREPTOCOCCUS INTERMEDIUS -s/p I/D deep abscess R wrist and dorsum of right hand on 5/18,     Wound care: keep splint in place , does not need to change right hand dressing unless soiled per ortho (A xeroform or petroleum   gauze over incision then dry gauze over that with splint and ACE)    right hand surgical cultures from 5/18 appear to to be polymicrobial but predominantly Streptococcus intermedius, detail please see original report - Ortho made aware patient will stay in hospital to receive antibiotic and antifungal treatment at least for another week or so ,need to  f/u  with Dr. Melvyn Novasrtmann  -two abscesses on left arm have spontaneously drained and healed  -TEE on 5/24 negative for vegetation - plan to finish 2 weeks amox-clav starting 5/18 to 6/6 for his abscesses -follow up on ID recommendation  Reports right shoulder limited range of motion with pain, will get mri right shoulder   Candida fungemia , POA -Blood cx obtained on admission 5/17 growing candida glabrata, Candida 9 panel pending  -Repeat blood cultures 5/23 no growth so far -plan 2 weeks antifungal from 5/23 (if that remains negative), or maybe able to  adjust micafungin to fluconazole pending candida susceptibility , ID will decide  -TEE no vegetation -Denies vision problem, ID recommend outpatient follow-up with ophthalmology - Appreciate ID recs, will follow recommendation  AKI (acute kidney injury) (HCC) Resolved, follow  Back pain C/o thoracic back pain Plain films unremarkable CT t spine without evidence of fracture, malalignment, or destructive process within thoracic spine MRI T/L spine without evidence of infection No LE weakness or saddle anesthesia Improved with lidocaine patch   Anxiety Paranoid, anxious at times - his mom died in the hospital within past few years ? Component of withdrawal , Repeat utox (opiates, benzo, thc) Still refusing medication (such as  Lovenox , stool softeners )intermittently, but compliance with antibiotic antifungal regimen Calm and cooperative, Appear easy to reason   Shortness of breath/chest pain Unsteady gait when walking Abdominal discomfort -presents  on admission, reports all resolved  -Negative troponin -TTE/TEE unremarkable -CT PE protocol with mild dependent bibasilar airspace opacities with associated volume loss favored to reflect atelectasis, small volume aspiration is a consideration  Prediabetes A1c 6 Am blood glucose 83  Hepatitis C infection viral load 1.2 million units Genotyping per ID Follow ID outpatient in  July   IVDU (intravenous drug user) Reports last heroin use a month ago -negative HIV -treponemal ab negative -hepc + Started on suboxone 5/20.  Will need to plan for discharge, taper vs follow outpatient with prescriber.  Monitor QTc on EKG  Tobacco abuse Nicotine patch   The patient's BMI is: Body mass index is 18.2 kg/m.Marland Kitchen Seen by dietician.  I agree with the assessment and plan as outlined below: Nutrition Status: Nutrition Problem: Moderate Malnutrition Etiology: chronic illness (substance abuse) Signs/Symptoms: moderate fat depletion, moderate muscle depletionInterventions: Ensure Enlive (each supplement provides 350kcal and 20 grams of protein), MVI, Juven, Education  .  I have Reviewed nursing notes, Vitals, pain scores, I/o's, Lab results and  imaging results since pt's last encounter, details please see discussion above  I ordered the following labs:  Unresulted Labs (From admission, onward)     Start     Ordered   12/26/21 0500  Basic metabolic panel  Tomorrow morning,   R       Question:  Specimen collection method  Answer:  Lab=Lab collect   12/25/21 0728   12/26/21 0500  Magnesium  Tomorrow morning,   R       Question:  Specimen collection method  Answer:  Lab=Lab collect   12/25/21 0728   12/26/21 0500  CBC with Differential/Platelet  Tomorrow morning,   R       Question:  Specimen collection method  Answer:  Lab=Lab collect   12/25/21 0728   12/26/21 0500  Sedimentation rate  Tomorrow morning,   R       Question:  Specimen collection method  Answer:  Lab=Lab collect   12/25/21 0728   12/26/21 0500  C-reactive protein  Tomorrow morning,   R       Question:  Specimen collection method  Answer:  Lab=Lab collect   12/25/21 0728   12/24/21 0500  HCV RNA quant rflx ultra or genotyp  Tomorrow morning,   R       Question:  Specimen collection method  Answer:  IV Team=IV Team collect   12/23/21 1211             DVT prophylaxis: enoxaparin (LOVENOX)  injection 40 mg Start: 12/20/21 1000 SCD's Start: 12/15/21 1819 SCDs Start: 12/14/21 2335   Code Status:   Code Status: Full Code  Family Communication: patient and significant other at bedside with permission on 5/28 Disposition:   Status is: Inpatient   Dispo: The patient is from: home               Anticipated d/c is to: TBD, need ID clearance              Anticipated d/c date is: f/u on mri right shoulder , f/u on candida susceptibility , need ID clearance,  ortho need to see again as he will remain in the hospital  Antimicrobials:   Anti-infectives (From admission, onward)    Start     Dose/Rate Route Frequency Ordered Stop   12/22/21 1400  amoxicillin-clavulanate (AUGMENTIN) 875-125 MG per tablet 1 tablet        1 tablet Oral Every 12 hours  12/22/21 1033 12/29/21 2359   12/19/21 2200  Ampicillin-Sulbactam (UNASYN) 3 g in sodium chloride 0.9 % 100 mL IVPB  Status:  Discontinued        3 g 200 mL/hr over 30 Minutes Intravenous Every 6 hours 12/19/21 1256 12/22/21 1033   12/19/21 2200  metroNIDAZOLE (FLAGYL) tablet 500 mg  Status:  Discontinued        500 mg Oral Every 12 hours 12/19/21 1240 12/19/21 1256   12/18/21 1800  micafungin (MYCAMINE) 100 mg in sodium chloride 0.9 % 100 mL IVPB        100 mg 110 mL/hr over 1 Hours Intravenous Daily 12/18/21 1557 01/03/22 2359   12/18/21 1800  metroNIDAZOLE (FLAGYL) tablet 500 mg  Status:  Discontinued        500 mg Oral Every 8 hours 12/18/21 1559 12/19/21 1240   12/16/21 1000  cefTRIAXone (ROCEPHIN) 1 g in sodium chloride 0.9 % 100 mL IVPB  Status:  Discontinued        1 g 200 mL/hr over 30 Minutes Intravenous Every 24 hours 12/16/21 0817 12/19/21 1256   12/15/21 2000  vancomycin (VANCOREADY) IVPB 1750 mg/350 mL  Status:  Discontinued        1,750 mg 175 mL/hr over 120 Minutes Intravenous Every 24 hours 12/14/21 2351 12/15/21 0953   12/15/21 1215  ceFAZolin (ANCEF) IVPB 2g/100 mL premix        2 g 200 mL/hr over 30 Minutes  Intravenous On call to O.R. 12/15/21 1206 12/15/21 1635   12/15/21 1000  vancomycin (VANCOREADY) IVPB 1250 mg/250 mL  Status:  Discontinued        1,250 mg 166.7 mL/hr over 90 Minutes Intravenous Every 12 hours 12/15/21 0954 12/18/21 1221   12/14/21 2345  azithromycin (ZITHROMAX) 500 mg in sodium chloride 0.9 % 250 mL IVPB  Status:  Discontinued        500 mg 250 mL/hr over 60 Minutes Intravenous Every 24 hours 12/14/21 2337 12/16/21 1901   12/14/21 1930  vancomycin (VANCOCIN) IVPB 1000 mg/200 mL premix        1,000 mg 200 mL/hr over 60 Minutes Intravenous  Once 12/14/21 1920 12/14/21 2352           Objective: Vitals:   12/23/21 2200 12/24/21 0528 12/24/21 2056 12/25/21 0540  BP: 110/62 115/74 123/69 108/65  Pulse: (!) 59 (!) 52 80 69  Resp: 16  20 16   Temp: 98.9 F (37.2 C) 98.4 F (36.9 C) 99 F (37.2 C) 99 F (37.2 C)  TempSrc: Oral Oral Oral Oral  SpO2: 98% 100% 100% 100%  Weight:      Height:        Intake/Output Summary (Last 24 hours) at 12/25/2021 1056 Last data filed at 12/25/2021 0600 Gross per 24 hour  Intake 720 ml  Output --  Net 720 ml    Filed Weights   12/14/21 1622 12/15/21 1207 12/21/21 1224  Weight: 74.8 kg 64.3 kg 64.3 kg    Examination:  General exam: aaox3, NAD, pleasant Respiratory system: Clear to auscultation. Respiratory effort normal. Cardiovascular system:  RRR.  Gastrointestinal system: Abdomen is nondistended, soft and nontender.  Normal bowel sounds heard. Central nervous system: Alert and oriented. No focal neurological deficits. Extremities:   right hand splint and Ace wrap , right shoulder limited range of motion with pain Skin: right upper extremity as above, 2 left arm abscess has drained and healed, no other new wounds Psychiatry: Pleasant, calm and cooperative this morning  Data Reviewed: I have personally reviewed  labs and visualized  imaging studies since the last encounter and formulate the plan         Scheduled Meds:  amoxicillin-clavulanate  1 tablet Oral Q12H   buprenorphine-naloxone  1 tablet Sublingual BID   enoxaparin (LOVENOX) injection  40 mg Subcutaneous Q24H   feeding supplement  237 mL Oral BID BM   lidocaine  1 patch Transdermal Q24H   mouth rinse  15 mL Mouth Rinse BID   multivitamin with minerals  1 tablet Oral Daily   nicotine  21 mg Transdermal Daily   nutrition supplement (JUVEN)  1 packet Oral BID WC   thiamine  100 mg Oral Daily   Continuous Infusions:  micafungin (MYCAMINE) IV 100 mg (12/24/21 1047)     LOS: 11 days     Albertine Grates, MD PhD FACP [facility]  Available via Epic secure chat 7am-7pm for nonurgent issues Please page for urgent issues To page the attending provider between 7A-7P or the covering provider during after hours 7P-7A, please log into the web site [email or web address] and access using universal [place] password for that web site. If you do not have the password, please call the hospital operator.    12/25/2021, 10:56 AM

## 2021-12-25 NOTE — Progress Notes (Signed)
Occupational Therapy Treatment Patient Details Name: Stephen Shepard MRN: [id number] DOB: [birth year] Today's Date: 12/25/2021   History of present illness 31 y.o. male with medical history significant of seizure not on antiepileptics and history of IV heroin drug use who presents with concerns of worsening abscess.   OT comments  Patient engaged in gentle ROM of RUE with education on importance of continuation of ROM during various times during the day. Patient verbalized and demonstrated understanding with continued reports of increased pain with all movements. Patient was educated on using RUE for functional tasks. Patient reported he attempted to use it for self feeding with increased difficulty with holding utensils. Patient was provided with red foam handles for utensils. Patient's discharge plan remains appropriate at this time. OT will continue to follow acutely.     Recommendations for follow up therapy are one component of a multi-disciplinary discharge planning process, led by the attending physician.  Recommendations may be updated based on patient status, additional functional criteria and insurance authorization.    Follow Up Recommendations  Outpatient OT    Assistance Recommended at Discharge PRN  Patient can return home with the following  Assistance with cooking/housework   Equipment Recommendations  None recommended by OT    Recommendations for Other Services      Precautions / Restrictions Precautions Precautions: None Precaution Comments: WBAT, per PA no restrictions for finger ROM Required Braces or Orthoses: Splint/Cast;Sling Splint/Cast: R forearm Restrictions Weight Bearing Restrictions: No       Mobility Bed Mobility                    Transfers                         Balance                                           ADL either performed or assessed with clinical judgement   ADL                                          General ADL Comments: patient reported it was hard to attempt to hold utensils with RUE. patient was provided with red foam handle to increase ability to engage in self feeding tasks with BUE. patient was educated on importance of keeping RUE elevated during the day and importance of reducing positioning of internal rotation. patient verbalized understanding. patient was extensively educated on importance of movement of RUE multiple times a day. patient verbalized understanding.    Extremity/Trunk Assessment              Vision       Perception     Praxis      Cognition Arousal/Alertness: Awake/alert Behavior During Therapy: WFL for tasks assessed/performed Overall Cognitive Status: Within Functional Limits for tasks assessed                                          Exercises Other Exercises Other Exercises: patient was educated on ROM of digits to maintain range. patient verbalized and demonstrated understanding during session. Other Exercises: Active Assist ROM to shoulder - patient  able to tolerate to about 80 degrees with reports of increased pain.    Shoulder Instructions       General Comments      Pertinent Vitals/ Pain       Pain Assessment Pain Assessment: Faces Faces Pain Scale: Hurts even more Pain Location: shoulder and fingers with ROM and movement Pain Descriptors / Indicators: Grimacing, Guarding Pain Intervention(s): Monitored during session, Premedicated before session  Home Living                                          Prior Functioning/Environment              Frequency  Min 2X/week        Progress Toward Goals  OT Goals(current goals can now be found in the care plan section)  Progress towards OT goals: Progressing toward goals     Plan Discharge plan remains appropriate    Co-evaluation                 AM-PAC OT "6 Clicks" Daily Activity     Outcome  Measure   Help from another person eating meals?: None Help from another person taking care of personal grooming?: None Help from another person toileting, which includes using toliet, bedpan, or urinal?: None Help from another person bathing (including washing, rinsing, drying)?: None Help from another person to put on and taking off regular upper body clothing?: None Help from another person to put on and taking off regular lower body clothing?: None 6 Click Score: 24    End of Session    OT Visit Diagnosis: Unsteadiness on feet (R26.81);Pain;Other symptoms and signs involving cognitive function Pain - Right/Left: Right Pain - part of body: Arm   Activity Tolerance Patient limited by pain   Patient Left in bed   Nurse Communication Patient requests pain meds        Time: 4970-2637 OT Time Calculation (min): 48 min  Charges: OT General Charges $OT Visit: 1 Visit OT Treatments $Therapeutic Activity: 38-52 mins  Sharyn Blitz OTR/L, MS Acute Rehabilitation Department Office# [phone] Pager# [phone]   Ardyth Harps 12/25/2021, 11:10 AM

## 2021-12-26 LAB — ANTIFUNGAL AST 9 DRUG PANEL
Amphotericin B MIC: 1
Fluconazole Islt MIC: 8
Flucytosine MIC: 0.06
Itraconazole MIC: 0.5
Posaconazole MIC: 1
Source: 183119
Voriconazole MIC: 0.25

## 2021-12-26 NOTE — Progress Notes (Signed)
PROGRESS NOTE    Stephen Shepard  EHU:[id number] DOB: 1991-04-06 DOA: 12/14/2021 PCP: Patient, [name] (Inactive)     Brief Narrative:  Stephen Shepard is a 31 year old male with past medical history significant for seizures, currently not on antiepileptic as outpatient, history of IV heroin use who presented to the hospital with concern for worsening abscess of the right upper extremity.  Orthopedic surgery was consulted.  Patient underwent I&D of right shoulder subcutaneous abscess, right wrist/dorsum of hand I&D of deep abscess, right hand second, third, fourth dorsal compartment tenosynovectomy by Dr. Orlan Leavens on 5/18.  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  Infectious disease consulted and patient underwent TEE.  New events last 24 hours / Subjective: Patient sitting by the window, states that he is feeling well without further pain of right shoulder or limited ROM.  His main complaint is repeated blood draws.  Assessment & Plan:    Principal Problem:   Abscess of multiple sites Active Problems:   Candidemia (HCC)   Anxiety   Shortness of breath   IVDU (intravenous drug user)   Back pain   Unsteady gait when walking   AKI (acute kidney injury) (HCC)   Abdominal discomfort   Hepatitis C infection   Chest pain   Tobacco abuse   Prediabetes   Sepsis (HCC)   Chronic hepatitis C without hepatic coma (HCC)   Malnutrition of moderate degree   Sepsis secondary to abscess of multiple sites of right upper extremity and Candida glabrata fungemia, sepsis present on admission -Status post I&D and tenosynovectomy with Dr. Orlan Leavens 5/18 -TEE 5/24 negative for vegetation -Infectious disease following, recommended for 2 weeks of Augmentin starting 5/18 (end date should be 6/1).  Complete micafungin for 14 days (end date should be 6/3) -Needs ophthalmology follow-up for retinal/fundoscopic exam to rule out subclinical candida involvement  AKI -Resolved  Hepatitis  C -Outpatient ID follow-up 7/11   IV drug abuse -Started on Suboxone 5/20  DVT prophylaxis:  enoxaparin (LOVENOX) injection 40 mg Start: 12/20/21 1000 SCD's Start: 12/15/21 1819 SCDs Start: 12/14/21 2335  Code Status: Full Family Communication: None at bedside  Disposition Plan:  Status is: Inpatient Remains inpatient appropriate because: IV micafungin    Antimicrobials:  Anti-infectives (From admission, onward)    Start     Dose/Rate Route Frequency Ordered Stop   12/22/21 1400  amoxicillin-clavulanate (AUGMENTIN) 875-125 MG per tablet 1 tablet        1 tablet Oral Every 12 hours 12/22/21 1033 12/29/21 2359   12/19/21 2200  Ampicillin-Sulbactam (UNASYN) 3 g in sodium chloride 0.9 % 100 mL IVPB  Status:  Discontinued        3 g 200 mL/hr over 30 Minutes Intravenous Every 6 hours 12/19/21 1256 12/22/21 1033   12/19/21 2200  metroNIDAZOLE (FLAGYL) tablet 500 mg  Status:  Discontinued        500 mg Oral Every 12 hours 12/19/21 1240 12/19/21 1256   12/18/21 1800  micafungin (MYCAMINE) 100 mg in sodium chloride 0.9 % 100 mL IVPB        100 mg 105 mL/hr over 1 Hours Intravenous Daily 12/18/21 1557 01/03/22 2359   12/18/21 1800  metroNIDAZOLE (FLAGYL) tablet 500 mg  Status:  Discontinued        500 mg Oral Every 8 hours 12/18/21 1559 12/19/21 1240   12/16/21 1000  cefTRIAXone (ROCEPHIN) 1 g in sodium chloride 0.9 % 100 mL IVPB  Status:  Discontinued  1 g 200 mL/hr over 30 Minutes Intravenous Every 24 hours 12/16/21 0817 12/19/21 1256   12/15/21 2000  vancomycin (VANCOREADY) IVPB 1750 mg/350 mL  Status:  Discontinued        1,750 mg 175 mL/hr over 120 Minutes Intravenous Every 24 hours 12/14/21 2351 12/15/21 0953   12/15/21 1215  ceFAZolin (ANCEF) IVPB 2g/100 mL premix        2 g 200 mL/hr over 30 Minutes Intravenous On call to O.R. 12/15/21 1206 12/15/21 1635   12/15/21 1000  vancomycin (VANCOREADY) IVPB 1250 mg/250 mL  Status:  Discontinued        1,250 mg 166.7 mL/hr  over 90 Minutes Intravenous Every 12 hours 12/15/21 0954 12/18/21 1221   12/14/21 2345  azithromycin (ZITHROMAX) 500 mg in sodium chloride 0.9 % 250 mL IVPB  Status:  Discontinued        500 mg 250 mL/hr over 60 Minutes Intravenous Every 24 hours 12/14/21 2337 12/16/21 1901   12/14/21 1930  vancomycin (VANCOCIN) IVPB 1000 mg/200 mL premix        1,000 mg 200 mL/hr over 60 Minutes Intravenous  Once 12/14/21 1920 12/14/21 2352        Objective: Vitals:   12/24/21 0528 12/24/21 2056 12/25/21 0540 12/25/21 2001  BP: 115/74 123/69 108/65 117/78  Pulse: (!) 52 80 69 92  Resp:  Temp: 98.4 F (36.9 C) 99 F (37.2 C) 99 F (37.2 C) 98.3 F (36.8 C)  TempSrc: Oral Oral Oral Oral  SpO2: 100% 100% 100% 100%  Weight:      Height:        Intake/Output Summary (Last 24 hours) at 12/26/2021 1223 Last data filed at 12/26/2021 1610 Gross per 24 hour  Intake 826 ml  Output --  Net 826 ml   Filed Weights   12/14/21 1622 12/15/21 1207 12/21/21 1224  Weight: 74.8 kg 64.3 kg 64.3 kg    Examination:  General exam: Appears calm and comfortable  Respiratory system: Clear to auscultation. Respiratory effort normal. No respiratory distress. No conversational dyspnea.  Cardiovascular system: S1 & S2 heard, RRR. No murmurs. No pedal edema. Gastrointestinal system: Abdomen is nondistended, soft and nontender. Normal bowel sounds heard. Central nervous system: Alert and oriented. No focal neurological deficits. Speech clear.  Extremities: Right wrist in brace  Skin: No rashes, lesions or ulcers on exposed skin  Psychiatry: Judgement and insight appear normal. Mood & affect appropriate.   Data Reviewed: I have personally reviewed following labs and imaging studies  CBC: Recent Labs  Lab 12/20/21 1009 12/21/21 1651  WBC 8.5 9.9  NEUTROABS 5.3 5.9  HGB 12.6* 11.6*  HCT 37.4* 35.1*  MCV 85.6 86.9  PLT 440* 369   Basic Metabolic Panel: Recent Labs  Lab 12/20/21 1009  12/21/21 1651 12/23/21 0959 12/24/21 0506 12/25/21 0638  NA 137 139 140 138 135  K 4.3 3.9 4.5 4.3 4.5  CL 99 103 106 102 100  CO2 GLUCOSE 115* 83 127* 95 110*  BUN 15 14 22* 22* 27*  CREATININE 1.10 0.96 1.02 0.87 0.81  CALCIUM 9.3 9.1 9.2 9.6 9.6  MG 2.2 2.2 2.4  --   --   PHOS 3.7 3.9  --   --   --    GFR: Estimated Creatinine Clearance: 119.1 mL/min (by C-G formula based on SCr of 0.81 mg/dL). Liver Function Tests: Recent Labs  Lab 12/20/21 1009 12/21/21 1651  AST 24  34  ALT 23 30  ALKPHOS 50 46  BILITOT 0.5 0.5  PROT 8.0 7.3  ALBUMIN 3.3* 3.2*   No results for input(s): LIPASE, AMYLASE in the last 168 hours. No results for input(s): AMMONIA in the last 168 hours. Coagulation Profile: No results for input(s): INR, PROTIME in the last 168 hours. Cardiac Enzymes: No results for input(s): CKTOTAL, CKMB, CKMBINDEX, TROPONINI in the last 168 hours. BNP (last 3 results) No results for input(s): PROBNP in the last 8760 hours. HbA1C: No results for input(s): HGBA1C in the last 72 hours. CBG: No results for input(s): GLUCAP in the last 168 hours. Lipid Profile: No results for input(s): CHOL, HDL, LDLCALC, TRIG, CHOLHDL, LDLDIRECT in the last 72 hours. Thyroid Function Tests: No results for input(s): TSH, T4TOTAL, FREET4, T3FREE, THYROIDAB in the last 72 hours. Anemia Panel: No results for input(s): VITAMINB12, FOLATE, FERRITIN, TIBC, IRON, RETICCTPCT in the last 72 hours. Sepsis Labs: No results for input(s): PROCALCITON, LATICACIDVEN in the last 168 hours.  Recent Results (from the past 240 hour(s))  Urine Culture     Status: None   Collection Time: 12/17/21  8:07 AM   Specimen: Urine, Clean Catch  Result Value Ref Range Status   Specimen Description   Final    URINE, CLEAN CATCH Performed at [facility], [address] [place], [place] [address]    Special Requests   Final    NONE Performed at [facility], [address] [place], [place] [address]    Culture   Final    NO GROWTH Performed at [facility], [address] [place], [place] [address]    Report Status 12/18/2021 FINAL  Final  Culture, blood (Routine X 2) w Reflex to ID Panel     Status: None   Collection Time: 12/20/21  3:47 PM   Specimen: BLOOD  Result Value Ref Range Status   Specimen Description   Final    BLOOD RIGHT ANTECUBITAL Performed at [facility], [address] [place], [place] [address]    Special Requests   Final    IN PEDIATRIC BOTTLE Blood Culture adequate volume Performed at [facility], [address] [place], [place] [address]    Culture   Final    NO GROWTH 5 DAYS Performed at [facility], [address] [place], [place] [address]    Report Status 12/25/2021 FINAL  Final  Culture, blood (Routine X 2) w Reflex to ID Panel     Status: None   Collection Time: 12/20/21  3:47 PM   Specimen: BLOOD  Result Value Ref Range Status   Specimen Description   Final    BLOOD RIGHT ANTECUBITAL LOWER Performed at [facility], [address] [place], [place] [address]    Special Requests   Final    IN PEDIATRIC BOTTLE Blood Culture adequate volume Performed at [facility], [address] [place], [place] [address]    Culture   Final    NO GROWTH 5 DAYS Performed at [facility], [address] [place], [place] [address]    Report Status 12/25/2021 FINAL  Final      Radiology Studies: No results found.    Scheduled Meds:  amoxicillin-clavulanate  1 tablet Oral Q12H   buprenorphine-naloxone  1 tablet Sublingual BID   enoxaparin (LOVENOX) injection  40 mg Subcutaneous Q24H   feeding supplement  237 mL Oral  BID BM   lidocaine  1 patch Transdermal Q24H   mouth rinse  15 mL Mouth Rinse BID   multivitamin with minerals  1 tablet Oral Daily   nicotine  21 mg Transdermal Daily   nutrition  supplement (JUVEN)  1 packet Oral BID WC   thiamine  100 mg Oral Daily   Continuous Infusions:  micafungin (MYCAMINE) IV 100 mg (12/26/21 1019)     LOS: 12 days     Noralee Stain, DO [facility] 12/26/2021, 12:23 PM   Available via Epic secure chat 7am-7pm After these hours, please refer to coverage provider listed on [email or web address]
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
## 2021-12-27 DIAGNOSIS — L02413 Cutaneous abscess of right upper limb: Secondary | ICD-10-CM

## 2021-12-27 LAB — CULTURE, BLOOD (ROUTINE X 2): Special Requests: ADEQUATE

## 2021-12-27 LAB — HCV RNA QUANT RFLX ULTRA OR GENOTYP
HCV RNA Qnt(log copy/mL): 5.813 log10 IU/mL
HepC Qn: 650000 IU/mL

## 2021-12-27 LAB — HEPATITIS C GENOTYPE

## 2021-12-27 NOTE — Progress Notes (Signed)
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 Of note recently received amox-clav for sinusitis   He is s/p I&D of right shoulder, right wrist/hand dorsum abscesses and tenosynovectomy of right hand 2nd, 3rd, and 4th dorsal compartments on 5/18   I suspect this is due to skin popping/ivdu, not embolic phenomenom but some concern arises for endocarditis given higher risk and also candida glabrata bsi and recent abx use with augmentin suppressing bcx   5/17 bcx candida glabrata; resulted 5/21 5/18 operative cx strep intermedius, haemophilus parainfluenzae (betalactamase negative), prevotella denticola (beta lactamase positive) 5/21 repeat bcx pending   5/24 tee no vegetation   Back pain but rather unconcerning mri thoracolumbar area     Other issues: Chronic hep c can be further addressed in ID clinic or gi clinic outpatient Syphilis screen negative Hiv and hep b negative  ---------- 5/30 assessment 5/24 tee negative for vegetation Ongoing no visual issue in setting fungemia Abscesses all drained and starting to scab over  C glabrata is SDD to fluconazole Repeat bcx 5/23 negative    Plan: Outpatient discussed with patient he'll need to see ophthalmology for a retinal/fundoscopic exam to r/o subclinical candida involvement Finish 2 weeks amox clav tomorrow 6/01 Finish fungemia tx micafungin --> fluconazole 800 mg po daily on 6/06 Follow up with me outpatient for chronic hep c evaluation/treatment on July 11 @ 230 pm; will follow up hcv genotype ordered here Will sign  off Discussed with primary team   I spent more than 35 minute reviewing data/chart, and coordinating care and >50% direct face to face time providing counseling/discussing diagnostics/treatment plan with patient      Principal Problem:   Abscess of multiple sites Active Problems:   IVDU (intravenous drug user)   Sepsis ([place])   AKI (acute kidney injury) ([place])   Shortness of breath   Back pain   Hepatitis C infection   Chest pain   Prediabetes   Anxiety   Abdominal discomfort   Tobacco abuse   Chronic hepatitis C without hepatic coma (HCC)   Candidemia ([place])   Unsteady gait when walking   Malnutrition of moderate degree   Allergies  Allergen Reactions   Ibuprofen Anaphylaxis   Other Anaphylaxis    Kepra   Wasp Venom Protein Swelling    hives    Scheduled Meds:  amoxicillin-clavulanate  1 tablet Oral Q12H   buprenorphine-naloxone  1 tablet Sublingual BID   enoxaparin (LOVENOX) injection  40 mg Subcutaneous Q24H   feeding supplement  237 mL Oral BID BM   lidocaine  1 patch Transdermal Q24H   mouth rinse  15 mL Mouth Rinse BID   multivitamin with minerals  1 tablet Oral Daily   nicotine  21 mg Transdermal Daily   nutrition supplement (JUVEN)  1 packet Oral BID WC   thiamine  100 mg Oral Daily   Continuous Infusions:  micafungin (MYCAMINE) IV 100 mg (12/27/21 1001)   PRN Meds:.acetaminophen, HYDROmorphone (DILAUDID) injection, hydrOXYzine, naLOXone (NARCAN)  injection, nicotine polacrilex, oxyCODONE, polyethylene  glycol   SUBJECTIVE: Feels well No complaint Awaiting suboxone clinic setup for dispo   Tolerating abx  Abscess on bilateral UE scabbed over    Review of Systems: ROS All other ROS was negative, except mentioned above     OBJECTIVE: Vitals:   12/25/21 2001 12/26/21 1435 12/26/21 2013 12/27/21 0456  BP: 117/78 118/67 (!) 114/59 117/70  Pulse: 92 90 83 64  Resp: 18 20 20 16   Temp: 98.3 F (36.8 C) 97.7 F (36.5 C) 97.9 F (36.6 C)  98.1 F (36.7 C)  TempSrc: Oral Oral Oral Oral  SpO2: 100% 100% 100% 100%  Weight:      Height:       Body mass index is 18.2 kg/m.  Physical Exam  General/constitutional: no distress, pleasant HEENT: Normocephalic, PER, Conj Clear, EOMI, Oropharynx clear Neck supple CV: rrr no mrg Lungs: clear to auscultation, normal respiratory effort Abd: Soft, Nontender  Skin/msk: right wrist in splint/dressing that is clean/dry; right shoulder/left forearm abscess no drainage/fluctuance -- wound scabbing    Lab Results Lab Results  Component Value Date   WBC 9.9 12/21/2021   HGB 11.6 (L) 12/21/2021   HCT 35.1 (L) 12/21/2021   MCV 86.9 12/21/2021   PLT 369 12/21/2021    Lab Results  Component Value Date   CREATININE 0.81 12/25/2021   BUN 27 (H) 12/25/2021   NA 135 12/25/2021   K 4.5 12/25/2021   CL 100 12/25/2021   CO2 25 12/25/2021    Lab Results  Component Value Date   ALT 30 12/21/2021   AST 34 12/21/2021   ALKPHOS 46 12/21/2021   BILITOT 0.5 12/21/2021      Microbiology: Recent Results (from the past 240 hour(s))  Culture, blood (Routine X 2) w Reflex to ID Panel     Status: None   Collection Time: 12/20/21  3:47 PM   Specimen: BLOOD  Result Value Ref Range Status   Specimen Description   Final    BLOOD RIGHT ANTECUBITAL Performed at [facility], [place] [address], [place], [place] [address]    Special Requests   Final    IN PEDIATRIC BOTTLE Blood Culture adequate volume Performed at [facility] [address] [place], [place] [address]    Culture   Final    NO GROWTH 5 DAYS Performed at [facility], [place] [address] [place], [place] [address]    Report Status 12/25/2021 FINAL  Final  Culture, blood (Routine X 2) w Reflex to ID Panel     Status: None   Collection Time: 12/20/21  3:47 PM   Specimen: BLOOD  Result Value Ref Range Status   Specimen Description   Final    BLOOD RIGHT ANTECUBITAL LOWER Performed  at [facility] [address] [place], [place] [address]    Special Requests   Final    IN PEDIATRIC BOTTLE Blood Culture adequate volume Performed at [facility] [address], [place], [place] [address]    Culture   Final    NO GROWTH 5 DAYS Performed at [facility], [place] [address] [place], [place] [address]    Report Status 12/25/2021 FINAL  Final     Serology:   Imaging: If present, new imagings (plain films, ct scans, and mri) have been personally visualized and interpreted; radiology reports have been reviewed. Decision making incorporated into the Impression / Recommendations.   5/18 ct abd/pelv with contrast 1. Mild  dependent bibasilar airspace opacities with associated volume loss favored to reflect atelectasis. Small volume aspiration is a consideration in the appropriate clinical context. 2. Otherwise, no acute cardiopulmonary process. 3. No evidence of fracture, malalignment or destructive process within the thoracic spine.   5/20 mri thoracolumbar spine 1. No evidence of discitis-osteomyelitis or epidural abscess. 2. Small left subarticular disc protrusion at T6-7 without spinal canal stenosis. 3. Small left subarticular disc protrusion at L4-5 mildly narrowing the left lateral recess. Otherwise normal thoracic and lumbar spine.  5/22 tte   1. Left ventricular ejection fraction, by estimation, is 55 to 60%. The  left ventricle has normal function. The left ventricle has no regional  wall motion abnormalities. Left ventricular diastolic parameters are  consistent with Grade I diastolic  dysfunction (impaired relaxation). The average left ventricular global  longitudinal strain is -16.5 %. The global longitudinal strain is  abnormal.   2. Right ventricular systolic function is normal. The right ventricular  size is normal.   3. The mitral valve is normal in structure. Trivial mitral valve  regurgitation.    4. The aortic valve is tricuspid. Aortic valve regurgitation is not  visualized. No aortic stenosis is present.   5. The inferior vena cava is dilated in size with <50% respiratory  variability, suggesting right atrial pressure of 15 mmHg.   Comparison(s): No prior Echocardiogram.   Conclusion(s)/Recommendation(s): No evidence of valvular vegetations on this transthoracic echocardiogram. Consider a transesophageal  echocardiogram to exclude infective endocarditis if clinically indicated.     5/24 tee No vegetation    Jabier Mutton, [facility] [place] [phone] pager    12/27/2021, 2:24 PM

## 2021-12-27 NOTE — Plan of Care (Signed)
No acute events overnight.    Problem: Education: Goal: Knowledge of General Education information will improve Description: Including pain rating scale, medication(s)/side effects and non-pharmacologic comfort measures Outcome: Progressing   Problem: Health Behavior/Discharge Planning: Goal: Ability to manage health-related needs will improve Outcome: Progressing   Problem: Clinical Measurements: Goal: Ability to maintain clinical measurements within normal limits will improve Outcome: Progressing Goal: Will remain free from infection Outcome: Progressing Goal: Diagnostic test results will improve Outcome: Progressing Goal: Respiratory complications will improve Outcome: Progressing Goal: Cardiovascular complication will be avoided Outcome: Progressing   Problem: Activity: Goal: Risk for activity intolerance will decrease Outcome: Progressing   Problem: Nutrition: Goal: Adequate nutrition will be maintained Outcome: Progressing   Problem: Coping: Goal: Level of anxiety will decrease Outcome: Progressing   Problem: Elimination: Goal: Will not experience complications related to bowel motility Outcome: Progressing Goal: Will not experience complications related to urinary retention Outcome: Progressing   Problem: Pain Managment: Goal: General experience of comfort will improve Outcome: Progressing   Problem: Safety: Goal: Ability to remain free from injury will improve Outcome: Progressing   Problem: Skin Integrity: Goal: Risk for impaired skin integrity will decrease Outcome: Progressing   Problem: Education: Goal: Knowledge of General Education information will improve Description: Including pain rating scale, medication(s)/side effects and non-pharmacologic comfort measures Outcome: Progressing   Problem: Health Behavior/Discharge Planning: Goal: Ability to manage health-related needs will improve Outcome: Progressing   Problem: Clinical  Measurements: Goal: Ability to maintain clinical measurements within normal limits will improve Outcome: Progressing Goal: Will remain free from infection Outcome: Progressing Goal: Diagnostic test results will improve Outcome: Progressing Goal: Respiratory complications will improve Outcome: Progressing Goal: Cardiovascular complication will be avoided Outcome: Progressing   Problem: Activity: Goal: Risk for activity intolerance will decrease Outcome: Progressing   Problem: Nutrition: Goal: Adequate nutrition will be maintained Outcome: Progressing   Problem: Coping: Goal: Level of anxiety will decrease Outcome: Progressing   Problem: Elimination: Goal: Will not experience complications related to bowel motility Outcome: Progressing Goal: Will not experience complications related to urinary retention Outcome: Progressing   Problem: Pain Managment: Goal: General experience of comfort will improve Outcome: Progressing   Problem: Safety: Goal: Ability to remain free from injury will improve Outcome: Progressing   Problem: Skin Integrity: Goal: Risk for impaired skin integrity will decrease Outcome: Progressing   

## 2021-12-27 NOTE — Progress Notes (Addendum)
PROGRESS NOTE    Stephen Shepard  QMV:[id number] DOB: 1991/01/13 DOA: 12/14/2021 PCP: Patient, [name] (Inactive)     Brief Narrative:  Stephen Shepard is a 31 year old male with past medical history significant for seizures, currently not on antiepileptic as outpatient, history of IV heroin use who presented to the hospital with concern for worsening abscess of the right upper extremity.  Orthopedic surgery was consulted.  Patient underwent I&D of right shoulder subcutaneous abscess, right wrist/dorsum of hand I&D of deep abscess, right hand second, third, fourth dorsal compartment tenosynovectomy by Dr. Orlan Leavens on 5/18.  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  Infectious disease consulted and patient underwent TEE.  New events last 24 hours / Subjective: Sitting in chair, finished breakfast this morning.  No complaints of right shoulder pain today.  Assessment & Plan:    Principal Problem:   Abscess of multiple sites Active Problems:   Candidemia (HCC)   Anxiety   Shortness of breath   IVDU (intravenous drug user)   Back pain   Unsteady gait when walking   AKI (acute kidney injury) (HCC)   Abdominal discomfort   Hepatitis C infection   Chest pain   Tobacco abuse   Prediabetes   Sepsis (HCC)   Chronic hepatitis C without hepatic coma (HCC)   Malnutrition of moderate degree   Sepsis secondary to abscess of multiple sites of right upper extremity and Candida glabrata fungemia, sepsis present on admission -Status post I&D and tenosynovectomy with Dr. Orlan Leavens 5/18 -TEE 5/24 negative for vegetation -Infectious disease following, recommended for 2 weeks of Augmentin starting 5/18.  Complete micafungin for 14 days (can deescalate to fluconazole 800mg  daily through 6/6 for total 2 weeks from negative blood culture on 5/23). Discussed with pharmacy and Dr. 6/23 today.  -Needs ophthalmology follow-up for retinal/fundoscopic exam to rule out subclinical candida  involvement  AKI -Resolved  Hepatitis C -Outpatient ID follow-up scheduled 7/11   IV drug abuse -Started on Suboxone 5/20. He remains on 8-2mg  BID. TOC looking into suboxone clinic outpatient follow up    DVT prophylaxis:  enoxaparin (LOVENOX) injection 40 mg Start: 12/20/21 1000 SCD's Start: 12/15/21 1819 SCDs Start: 12/14/21 2335  Code Status: Full Family Communication: At bedside  Disposition Plan:  Status is: Inpatient Remains inpatient appropriate because: suboxone follow up    Antimicrobials:  Anti-infectives (From admission, onward)    Start     Dose/Rate Route Frequency Ordered Stop   12/22/21 1400  amoxicillin-clavulanate (AUGMENTIN) 875-125 MG per tablet 1 tablet        1 tablet Oral Every 12 hours 12/22/21 1033 12/29/21 2359   12/19/21 2200  Ampicillin-Sulbactam (UNASYN) 3 g in sodium chloride 0.9 % 100 mL IVPB  Status:  Discontinued        3 g 200 mL/hr over 30 Minutes Intravenous Every 6 hours 12/19/21 1256 12/22/21 1033   12/19/21 2200  metroNIDAZOLE (FLAGYL) tablet 500 mg  Status:  Discontinued        500 mg Oral Every 12 hours 12/19/21 1240 12/19/21 1256   12/18/21 1800  micafungin (MYCAMINE) 100 mg in sodium chloride 0.9 % 100 mL IVPB        100 mg 105 mL/hr over 1 Hours Intravenous Daily 12/18/21 1557 01/03/22 2359   12/18/21 1800  metroNIDAZOLE (FLAGYL) tablet 500 mg  Status:  Discontinued        500 mg Oral Every 8 hours 12/18/21 1559 12/19/21 1240   12/16/21 1000  cefTRIAXone (ROCEPHIN) 1  g in sodium chloride 0.9 % 100 mL IVPB  Status:  Discontinued        1 g 200 mL/hr over 30 Minutes Intravenous Every 24 hours 12/16/21 0817 12/19/21 1256   12/15/21 2000  vancomycin (VANCOREADY) IVPB 1750 mg/350 mL  Status:  Discontinued        1,750 mg 175 mL/hr over 120 Minutes Intravenous Every 24 hours 12/14/21 2351 12/15/21 0953   12/15/21 1215  ceFAZolin (ANCEF) IVPB 2g/100 mL premix        2 g 200 mL/hr over 30 Minutes Intravenous On call to O.R. 12/15/21  1206 12/15/21 1635   12/15/21 1000  vancomycin (VANCOREADY) IVPB 1250 mg/250 mL  Status:  Discontinued        1,250 mg 166.7 mL/hr over 90 Minutes Intravenous Every 12 hours 12/15/21 0954 12/18/21 1221   12/14/21 2345  azithromycin (ZITHROMAX) 500 mg in sodium chloride 0.9 % 250 mL IVPB  Status:  Discontinued        500 mg 250 mL/hr over 60 Minutes Intravenous Every 24 hours 12/14/21 2337 12/16/21 1901   12/14/21 1930  vancomycin (VANCOCIN) IVPB 1000 mg/200 mL premix        1,000 mg 200 mL/hr over 60 Minutes Intravenous  Once 12/14/21 1920 12/14/21 2352        Objective: Vitals:   12/25/21 2001 12/26/21 1435 12/26/21 2013 12/27/21 0456  BP: 117/78 118/67 (!) 114/59 117/70  Pulse: 92 90 83 64  Resp: 18 20 20 16   Temp: 98.3 F (36.8 C) 97.7 F (36.5 C) 97.9 F (36.6 C) 98.1 F (36.7 C)  TempSrc: Oral Oral Oral Oral  SpO2: 100% 100% 100% 100%  Weight:      Height:        Intake/Output Summary (Last 24 hours) at 12/27/2021 0959 Last data filed at 12/27/2021 0856 Gross per 24 hour  Intake 1652 ml  Output --  Net 1652 ml    Filed Weights   12/14/21 1622 12/15/21 1207 12/21/21 1224  Weight: 74.8 kg 64.3 kg 64.3 kg    Examination:  General exam: Appears calm and comfortable  Psychiatry: Judgement and insight appear normal. Mood & affect appropriate.   Data Reviewed: I have personally reviewed following labs and imaging studies  CBC: Recent Labs  Lab 12/20/21 1009 12/21/21 1651  WBC 8.5 9.9  NEUTROABS 5.3 5.9  HGB 12.6* 11.6*  HCT 37.4* 35.1*  MCV 85.6 86.9  PLT 440* 369    Basic Metabolic Panel: Recent Labs  Lab 12/20/21 1009 12/21/21 1651 12/23/21 0959 12/24/21 0506 12/25/21 0638  NA 137 139 140 138 135  K 4.3 3.9 4.5 4.3 4.5  CL 99 103 106 102 100  CO2 30 28 28 29 25   GLUCOSE 115* 83 127* 95 110*  BUN 15 14 22* 22* 27*  CREATININE 1.10 0.96 1.02 0.87 0.81  CALCIUM 9.3 9.1 9.2 9.6 9.6  MG 2.2 2.2 2.4  --   --   PHOS 3.7 3.9  --   --   --      GFR: Estimated Creatinine Clearance: 119.1 mL/min (by C-G formula based on SCr of 0.81 mg/dL). Liver Function Tests: Recent Labs  Lab 12/20/21 1009 12/21/21 1651  AST 24 34  ALT 23 30  ALKPHOS 50 46  BILITOT 0.5 0.5  PROT 8.0 7.3  ALBUMIN 3.3* 3.2*    No results for input(s): LIPASE, AMYLASE in the last 168 hours. No results for input(s): AMMONIA in the last 168 hours.  Coagulation Profile: No results for input(s): INR, PROTIME in the last 168 hours. Cardiac Enzymes: No results for input(s): CKTOTAL, CKMB, CKMBINDEX, TROPONINI in the last 168 hours. BNP (last 3 results) No results for input(s): PROBNP in the last 8760 hours. HbA1C: No results for input(s): HGBA1C in the last 72 hours. CBG: No results for input(s): GLUCAP in the last 168 hours. Lipid Profile: No results for input(s): CHOL, HDL, LDLCALC, TRIG, CHOLHDL, LDLDIRECT in the last 72 hours. Thyroid Function Tests: No results for input(s): TSH, T4TOTAL, FREET4, T3FREE, THYROIDAB in the last 72 hours. Anemia Panel: No results for input(s): VITAMINB12, FOLATE, FERRITIN, TIBC, IRON, RETICCTPCT in the last 72 hours. Sepsis Labs: No results for input(s): PROCALCITON, LATICACIDVEN in the last 168 hours.  Recent Results (from the past 240 hour(s))  Culture, blood (Routine X 2) w Reflex to ID Panel     Status: None   Collection Time: 12/20/21  3:47 PM   Specimen: BLOOD  Result Value Ref Range Status   Specimen Description   Final    BLOOD RIGHT ANTECUBITAL Performed at [facility], [address], [place], [place] [address]    Special Requests   Final    IN PEDIATRIC BOTTLE Blood Culture adequate volume Performed at [facility], [address], [place], [place] [address]    Culture   Final    NO GROWTH 5 DAYS Performed at [facility], [address] [place], [place] [address]    Report Status 12/25/2021 FINAL  Final  Culture, blood (Routine X 2) w Reflex to ID  Panel     Status: None   Collection Time: 12/20/21  3:47 PM   Specimen: BLOOD  Result Value Ref Range Status   Specimen Description   Final    BLOOD RIGHT ANTECUBITAL LOWER Performed at [facility], [address], [place], [place] [address]    Special Requests   Final    IN PEDIATRIC BOTTLE Blood Culture adequate volume Performed at [facility], [address] [place], [place] [address]    Culture   Final    NO GROWTH 5 DAYS Performed at [facility], [address] [place], [place] [address]    Report Status 12/25/2021 FINAL  Final       Radiology Studies: No results found.    Scheduled Meds:  amoxicillin-clavulanate  1 tablet Oral Q12H   buprenorphine-naloxone  1 tablet Sublingual BID   enoxaparin (LOVENOX) injection  40 mg Subcutaneous Q24H   feeding supplement  237 mL Oral BID BM   lidocaine  1 patch Transdermal Q24H   mouth rinse  15 mL Mouth Rinse BID   multivitamin with minerals  1 tablet Oral Daily   nicotine  21 mg Transdermal Daily   nutrition supplement (JUVEN)  1 packet Oral BID WC   thiamine  100 mg Oral Daily   Continuous Infusions:  micafungin (MYCAMINE) IV 100 mg (12/26/21 1019)     LOS: 13 days     Noralee Stain, DO [facility] 12/27/2021, 9:59 AM   Available via Epic secure chat 7am-7pm After these hours, please refer to coverage provider listed on [email or web address]

## 2021-12-27 NOTE — TOC Progression Note (Signed)
Transition of Care [facility]) - Progression Note    Patient Details  Name: Stephen Shepard MRN: [id number] Date of Birth: [birth year]  [facility] of [facility]) CM/SW Contact  Darleene Cleaver, [place] Phone Number: 12/27/2021, 3:05 PM  Clinical Narrative:     Patient will need suboxene appointment upon discharge.  Patient does not have insurance and will have to go to [facility] which is a walk in clinic.  Information added to patient's AVS.   Expected Discharge Plan: Home/Self Care Barriers to Discharge: Continued Medical Work up  Expected Discharge Plan and Services Expected Discharge Plan: Home/Self Care                                               Social Determinants of Health (SDOH) Interventions    Readmission Risk Interventions     View : No data to display.

## 2021-12-28 ENCOUNTER — Other Ambulatory Visit (HOSPITAL_COMMUNITY): Payer: Self-pay

## 2021-12-28 LAB — BASIC METABOLIC PANEL
Anion gap: 10 (ref 5–15)
BUN: 29 mg/dL — ABNORMAL HIGH (ref 6–20)
CO2: 25 mmol/L (ref 22–32)
Calcium: 10.3 mg/dL (ref 8.9–10.3)
Chloride: 105 mmol/L (ref 98–111)
Creatinine, Ser: 0.93 mg/dL (ref 0.61–1.24)
GFR, Estimated: >60 mL/min (ref >60)
Glucose, Bld: 100 mg/dL — ABNORMAL HIGH (ref 70–99)
Potassium: 4.8 mmol/L (ref 3.5–5.1)
Sodium: 140 mmol/L (ref 135–145)

## 2021-12-28 LAB — CBC
HCT: 44.4 % (ref 39.0–52.0)
Hemoglobin: 14.4 g/dL (ref 13.0–17.0)
MCH: 28.2 pg (ref 26.0–34.0)
MCHC: 32.4 g/dL (ref 30.0–36.0)
MCV: 86.9 fL (ref 80.0–100.0)
Platelets: UNDETERMINED 10*3/uL (ref 150–400)
RBC: 5.11 MIL/uL (ref 4.22–5.81)
RDW: 15.4 % (ref 11.5–15.5)
WBC: 6.8 10*3/uL (ref 4.0–10.5)
nRBC: 0 % (ref 0.0–0.2)

## 2021-12-28 MED ORDER — FLUCONAZOLE 200 MG PO TABS
800.0000 mg | ORAL_TABLET | Freq: Every day | ORAL | Status: DC
  Administered 2021-12-28: 800 mg via ORAL
  Filled 2021-12-28: qty 4

## 2021-12-28 MED ORDER — ADULT MULTIVITAMIN W/MINERALS CH: 1.0000 | ORAL_TABLET | Freq: Every day | ORAL | 0 refills | Status: DC

## 2021-12-28 MED ORDER — ADULT MULTIVITAMIN W/MINERALS CH
1.0000 | ORAL_TABLET | Freq: Every day | ORAL | 0 refills | Status: DC
Start: 2021-12-29 — End: 2021-12-28

## 2021-12-28 MED ORDER — NICOTINE 21 MG/24HR TD PT24
21.0000 mg | MEDICATED_PATCH | Freq: Every day | TRANSDERMAL | 0 refills | Status: DC
  Filled 2021-12-28: qty 28, 28d supply, fill #0

## 2021-12-28 MED ORDER — FLUCONAZOLE 200 MG PO TABS: 800.0000 mg | ORAL_TABLET | Freq: Every day | ORAL | 0 refills | Status: DC

## 2021-12-28 MED ORDER — THIAMINE HCL 100 MG PO TABS: 100.0000 mg | ORAL_TABLET | Freq: Every day | ORAL | 0 refills | Status: DC

## 2021-12-28 MED ORDER — THIAMINE HCL 100 MG PO TABS
100.0000 mg | ORAL_TABLET | Freq: Every day | ORAL | 0 refills | Status: DC
  Filled 2021-12-28: qty 30, 30d supply, fill #0

## 2021-12-28 MED ORDER — AMOXICILLIN-POT CLAVULANATE 875-125 MG PO TABS: 1.0000 | ORAL_TABLET | Freq: Two times a day (BID) | ORAL | 0 refills | Status: DC

## 2021-12-28 MED ORDER — NICOTINE 21 MG/24HR TD PT24: 21.0000 mg | MEDICATED_PATCH | Freq: Every day | TRANSDERMAL | 0 refills | Status: DC

## 2021-12-28 MED ORDER — HYDROXYZINE HCL 25 MG PO TABS
25.0000 mg | ORAL_TABLET | Freq: Four times a day (QID) | ORAL | 0 refills | Status: DC | PRN
  Filled 2021-12-28 – 2021-12-29 (×2): qty 30, 8d supply, fill #0

## 2021-12-28 MED ORDER — AMOXICILLIN-POT CLAVULANATE 875-125 MG PO TABS
1.0000 | ORAL_TABLET | Freq: Two times a day (BID) | ORAL | 0 refills | Status: AC
  Filled 2021-12-28: qty 3, 2d supply, fill #0

## 2021-12-28 MED ORDER — FLUCONAZOLE 200 MG PO TABS
800.0000 mg | ORAL_TABLET | Freq: Every day | ORAL | 0 refills | Status: AC
  Filled 2021-12-28: qty 24, 6d supply, fill #0

## 2021-12-28 NOTE — TOC Transition Note (Addendum)
Transition of Care [facility]) - CM/SW Discharge Note   Patient Details  Name: Stephen Shepard MRN: [id number] Date of Birth: 07/28/1991  Transition of Care [facility]) CM/SW Contact:  Darleene Cleaver, LCSW Phone Number: 12/28/2021, 5:23 PM   Clinical Narrative:     CSW attempted to contact [facility] to find out if they needed a prescription for patient.  Left a message on voice mail waiting for a call back.  Patient to discharge back home.  Patient was provided contact information for [facility] to get set up with suboxone clinic.  TOC signing off, please reconsult with other social work needs.  Final next level of care: Home/Self Care Barriers to Discharge: Barriers Resolved   Patient Goals and CMS Choice Patient states their goals for this hospitalization and ongoing recovery are:: To return back home.      Discharge Placement                       Discharge Plan and Services                                     Social Determinants of Health (SDOH) Interventions     Readmission Risk Interventions     View : No data to display.

## 2021-12-28 NOTE — Plan of Care (Signed)
No acute events overnight.    Problem: Education: Goal: Knowledge of General Education information will improve Description: Including pain rating scale, medication(s)/side effects and non-pharmacologic comfort measures Outcome: Progressing   Problem: Health Behavior/Discharge Planning: Goal: Ability to manage health-related needs will improve Outcome: Progressing   Problem: Clinical Measurements: Goal: Ability to maintain clinical measurements within normal limits will improve Outcome: Progressing Goal: Will remain free from infection Outcome: Progressing Goal: Diagnostic test results will improve Outcome: Progressing Goal: Respiratory complications will improve Outcome: Progressing Goal: Cardiovascular complication will be avoided Outcome: Progressing   Problem: Activity: Goal: Risk for activity intolerance will decrease Outcome: Progressing   Problem: Nutrition: Goal: Adequate nutrition will be maintained Outcome: Progressing   Problem: Coping: Goal: Level of anxiety will decrease Outcome: Progressing   Problem: Elimination: Goal: Will not experience complications related to bowel motility Outcome: Progressing Goal: Will not experience complications related to urinary retention Outcome: Progressing   Problem: Pain Managment: Goal: General experience of comfort will improve Outcome: Progressing   Problem: Safety: Goal: Ability to remain free from injury will improve Outcome: Progressing   Problem: Skin Integrity: Goal: Risk for impaired skin integrity will decrease Outcome: Progressing   Problem: Education: Goal: Knowledge of General Education information will improve Description: Including pain rating scale, medication(s)/side effects and non-pharmacologic comfort measures Outcome: Progressing   Problem: Health Behavior/Discharge Planning: Goal: Ability to manage health-related needs will improve Outcome: Progressing   Problem: Clinical  Measurements: Goal: Ability to maintain clinical measurements within normal limits will improve Outcome: Progressing Goal: Will remain free from infection Outcome: Progressing Goal: Diagnostic test results will improve Outcome: Progressing Goal: Respiratory complications will improve Outcome: Progressing Goal: Cardiovascular complication will be avoided Outcome: Progressing   Problem: Activity: Goal: Risk for activity intolerance will decrease Outcome: Progressing   Problem: Nutrition: Goal: Adequate nutrition will be maintained Outcome: Progressing   Problem: Coping: Goal: Level of anxiety will decrease Outcome: Progressing   Problem: Elimination: Goal: Will not experience complications related to bowel motility Outcome: Progressing Goal: Will not experience complications related to urinary retention Outcome: Progressing   Problem: Pain Managment: Goal: General experience of comfort will improve Outcome: Progressing   Problem: Safety: Goal: Ability to remain free from injury will improve Outcome: Progressing   Problem: Skin Integrity: Goal: Risk for impaired skin integrity will decrease Outcome: Progressing   

## 2021-12-28 NOTE — Progress Notes (Signed)
Patient will be discharging at noon. Belongings and meds were returned to patient.

## 2021-12-28 NOTE — Progress Notes (Signed)
Occupational Therapy Treatment Patient Details Name: Stephen Shepard MRN: [id number] DOB: 09-28-1990 Today's Date: 12/28/2021   History of present illness 31 y.o. male with medical history significant of seizure not on antiepileptics and history of IV heroin drug use who presents with concerns of worsening abscess.   OT comments  Pt. Was seen for skilled OT for ADL and transfer training. Pt. Was able to demo ability with adls. Pt. Only needs assist with ue bathing and partner is able to assist with this. Pt. Is I with mobility and was able to demo ability with adl transfers. Pt. Is dc home today per pnt.    Recommendations for follow up therapy are one component of a multi-disciplinary discharge planning process, led by the attending physician.  Recommendations may be updated based on patient status, additional functional criteria and insurance authorization.    Follow Up Recommendations  Outpatient OT    Assistance Recommended at Discharge PRN  Patient can return home with the following  A little help with bathing/dressing/bathroom;Assistance with cooking/housework   Equipment Recommendations  None recommended by OT    Recommendations for Other Services      Precautions / Restrictions Precautions Precautions: None Precaution Comments: WBAT, per PA no restrictions for finger ROM Required Braces or Orthoses: Splint/Cast;Sling Splint/Cast: R forearm Restrictions Weight Bearing Restrictions: No       Mobility Bed Mobility Overal bed mobility: Independent Bed Mobility: Supine to Sit     Supine to sit: Independent          Transfers Overall transfer level: Needs assistance Equipment used: None   Sit to Stand: Independent                 Balance     Sitting balance-Leahy Scale: Normal       Standing balance-Leahy Scale: Good                             ADL either performed or assessed with clinical judgement   ADL Overall ADL's : Needs  assistance/impaired Eating/Feeding: Independent   Grooming: Independent;Standing   Upper Body Bathing: Minimal assistance;Sitting   Lower Body Bathing: Independent   Upper Body Dressing : Independent;Sitting   Lower Body Dressing: Modified independent;Sit to/from stand   Toilet Transfer: Independent   Toileting- Water quality scientist and Hygiene: Independent       Functional mobility during ADLs: Independent General ADL Comments: needs assist ue bathing to wash l ue.    Extremity/Trunk Assessment Upper Extremity Assessment RUE Deficits / Details: Shoulder and elbow grossly functional - long splint on forearm. Able to wiggle fingers and thumb LUE Deficits / Details: WFL ROM and strength LUE Sensation: WNL LUE Coordination: WNL   Lower Extremity Assessment Lower Extremity Assessment: Defer to PT evaluation        Vision   Vision Assessment?: No apparent visual deficits   Perception     Praxis      Cognition Arousal/Alertness: Awake/alert Behavior During Therapy: WFL for tasks assessed/performed Overall Cognitive Status: Within Functional Limits for tasks assessed Area of Impairment: Attention, Problem solving                             Problem Solving: Requires verbal cues General Comments: VC for attention to task as pt easily distracted increasing risk for falls, very motivated to the point of restless and slightly impulsive, follows commands appropriately  Exercises Exercises:  (Pt. ed and was able to perform exercises for r ue shld, elbow, and digits. Pt. ed on elevation for edema control.)    Shoulder Instructions       General Comments      Pertinent Vitals/ Pain       Pain Assessment Pain Assessment: No/denies pain  Home Living                                          Prior Functioning/Environment              Frequency           Progress Toward Goals  OT Goals(current goals can now be found  in the care plan section)  Progress towards OT goals: Goals met/education completed, patient discharged from OT  Acute Rehab OT Goals Patient Stated Goal: going home today OT Goal Formulation: With patient/family Time For Goal Achievement: 01/01/22 Potential to Achieve Goals: Good ADL Goals Pt Will Perform Upper Body Dressing: with modified independence Pt Will Perform Lower Body Dressing: with modified independence;sit to/from stand Pt Will Transfer to Toilet: with modified independence;ambulating Pt Will Perform Toileting - Clothing Manipulation and hygiene: with modified independence;sit to/from stand Pt/caregiver will Perform Home Exercise Program: Right Upper extremity;Increased ROM Additional ADL Goal #1: Patient will stand at sink to perform grooming task as evidence of improving balance  Plan Discharge plan remains appropriate    Co-evaluation                 AM-PAC OT "6 Clicks" Daily Activity     Outcome Measure   Help from another person eating meals?: None Help from another person taking care of personal grooming?: None Help from another person toileting, which includes using toliet, bedpan, or urinal?: None Help from another person bathing (including washing, rinsing, drying)?: A Little Help from another person to put on and taking off regular upper body clothing?: None Help from another person to put on and taking off regular lower body clothing?: None 6 Click Score: 23    End of Session        Activity Tolerance Patient tolerated treatment well   Patient Left in chair   Nurse Communication  (ok therapy)        Time: 1114-1140 OT Time Calculation (min): 26 min  Charges: OT General Charges $OT Visit: 1 Visit OT Treatments $Self Care/Home Management : 23-37 mins  Reece Packer OT/L   Nimrat Woolworth 12/28/2021, 11:44 AM

## 2021-12-28 NOTE — Discharge Summary (Signed)
Physician Discharge Summary   Patient: Stephen Shepard MRN: [id number] DOB: [birth year]  Admit date:     12/14/2021  Discharge date: 12/28/21  Discharge Physician: Elmarie Shiley   PCP: Patient, [name] (Inactive)   Recommendations at discharge:   Needs ophthalmology evaluation out patient.  Needs follow up with Dr [facility].  Needs to follow up with ID.   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 *  Hospital Course: Stephen Shepard is a 31 year old male with past medical history significant for seizures, currently not on antiepileptic as outpatient, history of IV heroin use who presented to the hospital with concern for worsening abscess of the right upper extremity.  Orthopedic surgery was consulted.  Patient underwent I&D of right shoulder subcutaneous abscess, right wrist/dorsum of hand I&D of deep abscess, right hand second, third, fourth dorsal compartment tenosynovectomy by Dr. Apolonio Schneiders on 5/18.  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  Infectious disease consulted and patient underwent TEE.  ID recommend oral fluconazole at discharge, until 6/6. Complete Augmentin 6/01.    Assessment and Plan: 1-Sepsis secondary to abscess of multiple sites of right upper extremity and Candida glabrata fungemia, sepsis present on admission -Status post I&D and tenosynovectomy with Dr. Apolonio Schneiders 5/18 -TEE 5/24 negative for vegetation -Infectious disease following, recommended for 2 weeks of Augmentin starting 5/18.   Complete micafungin for 14 days (can deescalate to fluconazole 81m daily through 6/6 for total 2 weeks from negative blood culture on 5/23).  -Needs ophthalmology follow-up for retinal/fundoscopic exam to rule out subclinical candida involvement Last dose of fluconazole 6/06. Lst dose Augmentin 6/01.  AKI -Resolved   Hepatitis C -Outpatient ID follow-up scheduled 7/11   IV drug abuse -Started on Suboxone 5/20. He remains on 8-245mBID. TOC looking into suboxone clinic outpatient follow up. [facility]         Consultants: ID, Ortho Procedures performed: I and D Disposition: Home Diet recommendation:  Discharge Diet Orders (From admission, onward)     Start     Ordered   12/28/21 0000  Diet - low sodium heart healthy        12/28/21 1054           Regular diet DISCHARGE MEDICATION: Allergies as of 12/28/2021       Reactions   Ibuprofen Anaphylaxis   Other Anaphylaxis   Kepra   Wasp Venom Protein Swelling   hives        Medication List     STOP taking these medications    methocarbamol 500 MG tablet Commonly known as: ROBAXIN       TAKE these medications    acetaminophen 500 MG tablet Commonly known as: TYLENOL Take 500 mg by mouth every 6 (six) hours as needed for moderate pain.   amoxicillin-clavulanate 875-125 MG tablet Commonly known as: AUGMENTIN Take 1 tablet by mouth every 12 (twelve) hours for 3 doses. What changed: when to take this  fluconazole 200 MG tablet Commonly known as: DIFLUCAN Take 4 tablets (800 mg total) by mouth daily for 6 days.   lidocaine 5 % Commonly known as: Lidoderm Place 1 patch onto the skin daily. Remove & Discard patch within 12 hours or as directed by MD   multivitamin with minerals Tabs tablet Take 1 tablet by mouth daily. Start taking on: December 29, 2021   nicotine 21 mg/24hr patch Commonly known as: NICODERM CQ - dosed in mg/24 hours Place 1 patch (21 mg total) onto  the skin daily. Start taking on: December 29, 2021   thiamine 100 MG tablet Take 1 tablet (100 mg total) by mouth daily. Start taking on: December 29, 2021               Discharge Care Instructions  (From admission, onward)           Start     Ordered   12/28/21 0000  Discharge wound care:       Comments: See above   12/28/21 1054            Follow-up Information     [facility] Follow up.   Why: [facility] is walk in only. Contact information: [place], [place] [address] [phone]               Discharge Exam: Filed Weights   12/14/21 1622 12/15/21 1207 12/21/21 1224  Weight: 74.8 kg 64.3 kg 64.3 kg   General; NAD Lung; CTA  Condition at discharge: stable  The results of significant diagnostics from this hospitalization (including imaging, microbiology, ancillary and laboratory) are listed below for reference.   Imaging Studies: DG Thoracic Spine 2 View  Result Date: 12/15/2021 CLINICAL DATA:  Provided history: Upper back pain since March 2023. EXAM: THORACIC SPINE 2 VIEWS COMPARISON:  Chest CT 06/14/2019. FINDINGS: The patient was unable to tolerate lateral view radiographs, and only AP view radiographs could be acquired. This significantly limits evaluation. Levocurvature of the upper thoracic and lower cervical spine. Within described limitations, there is no appreciable thoracic vertebral compression fracture. IMPRESSION: The patient was unable to tolerate lateral view radiographs, significantly limiting evaluation. No appreciable thoracic vertebral compression fracture on the acquired AP view radiographs. Levocurvature of the upper thoracic and lower cervical spine. Electronically Signed   By: Kellie Simmering D.O.   On: 12/15/2021 09:37   DG Wrist Complete Right  Result Date: 12/14/2021 CLINICAL DATA:  Right wrist pain and swelling. EXAM: RIGHT WRIST - COMPLETE 3+ VIEW COMPARISON:  None Available. FINDINGS: The joint spaces are  maintained. No acute bony findings or destructive bony changes. IMPRESSION: No acute bony findings. Electronically Signed   By: Marijo Sanes M.D.   On: 12/14/2021 17:47   CT Angio Chest Pulmonary Embolism (PE) W or WO Contrast  Result Date: 12/15/2021 CLINICAL DATA:  Chest pain, shortness of breath with pleuritic chest pain EXAM: CT ANGIOGRAPHY CHEST WITH CONTRAST TECHNIQUE: Multidetector CT imaging of the chest was performed using the standard protocol during bolus administration of intravenous contrast. Multiplanar CT image reconstructions and MIPs were obtained to evaluate the vascular anatomy. Additional CT imaging and reformats of the thoracic spine were performed. RADIATION DOSE REDUCTION: This exam was performed according to the departmental dose-optimization program which includes automated exposure control, adjustment of the mA and/or kV according to patient size and/or use of iterative reconstruction technique. CONTRAST:  1110m OMNIPAQUE IOHEXOL 350 MG/ML SOLN COMPARISON:  CT scan of the thoracic spine  FINDINGS: Cardiovascular: Satisfactory opacification of the pulmonary arteries to the segmental level. No evidence of pulmonary embolism. Normal heart size. No pericardial effusion. Mediastinum/Nodes: No enlarged mediastinal, hilar, or axillary lymph nodes. Thyroid gland, trachea, and esophagus demonstrate no significant findings. Lungs/Pleura: Mild volume loss with patchy bibasilar dependent airspace opacities. Findings are most suggestive of atelectasis. No focal airspace infiltrate to suggest pneumonia. No pleural effusion, pneumothorax or cavitary pulmonary nodules. Upper Abdomen: No acute abnormality. Incidental note made of replaced lateral segmental branch of the left hepatic artery. Musculoskeletal: No chest wall abnormality. No acute or significant osseous findings. Additional separately reconstructed dedicated CT images of the thoracic spine were also evaluated. No evidence of fracture,  malalignment or significant degenerative change. No level specific degenerative changes. No destructive changes, endplate sclerosis or soft tissue abnormality to suggest osteomyelitis/discitis. Review of the MIP images confirms the above findings. IMPRESSION: 1. Mild dependent bibasilar airspace opacities with associated volume loss favored to reflect atelectasis. Small volume aspiration is a consideration in the appropriate clinical context. 2. Otherwise, no acute cardiopulmonary process. 3. No evidence of fracture, malalignment or destructive process within the thoracic spine. Electronically Signed   By: Jacqulynn Cadet M.D.   On: 12/15/2021 10:17   MR THORACIC SPINE W WO CONTRAST  Result Date: 12/17/2021 CLINICAL DATA:  Low back pain with suspected infection. IV drug abuse. EXAM: MRI THORACIC AND LUMBAR SPINE WITHOUT AND WITH CONTRAST TECHNIQUE: Multiplanar and multiecho pulse sequences of the thoracic and lumbar spine were obtained without and with intravenous contrast. CONTRAST:  79m GADAVIST GADOBUTROL 1 MMOL/ML IV SOLN COMPARISON:  None Available. FINDINGS: MRI THORACIC SPINE FINDINGS Alignment:  Physiologic. Vertebrae: No fracture, evidence of discitis, or bone lesion. Cord:  Normal signal and morphology. Paraspinal and other soft tissues: Negative. Disc levels: Small left subarticular disc protrusion at T6-7 without spinal canal stenosis. The other thoracic disc levels are normal. Postcontrast images of the thoracic spine are motion degraded but within that limitation there is no enhancing abnormality. MRI LUMBAR SPINE FINDINGS Segmentation:  Standard. Alignment:  Physiologic. Vertebrae:  No fracture, evidence of discitis, or bone lesion. Conus medullaris: Extends to the L1 level and appears normal. Paraspinal and other soft tissues: Normal Disc levels: At L4-5, there is a small left subarticular disc protrusion mildly narrowing the left lateral recess. No central spinal canal or neural foraminal  stenosis. The other lumbar disc levels are normal. IMPRESSION: 1. No evidence of discitis-osteomyelitis or epidural abscess. 2. Small left subarticular disc protrusion at T6-7 without spinal canal stenosis. 3. Small left subarticular disc protrusion at L4-5 mildly narrowing the left lateral recess. Otherwise normal thoracic and lumbar spine. Electronically Signed   By: KUlyses JarredM.D.   On: 12/17/2021 00:39   MR Lumbar Spine W Wo Contrast  Result Date: 12/17/2021 CLINICAL DATA:  Low back pain with suspected infection. IV drug abuse. EXAM: MRI THORACIC AND LUMBAR SPINE WITHOUT AND WITH CONTRAST TECHNIQUE: Multiplanar and multiecho pulse sequences of the thoracic and lumbar spine were obtained without and with intravenous contrast. CONTRAST:  661mGADAVIST GADOBUTROL 1 MMOL/ML IV SOLN COMPARISON:  None Available. FINDINGS: MRI THORACIC SPINE FINDINGS Alignment:  Physiologic. Vertebrae: No fracture, evidence of discitis, or bone lesion. Cord:  Normal signal and morphology. Paraspinal and other soft tissues: Negative. Disc levels: Small left subarticular disc protrusion at T6-7 without spinal canal stenosis. The other thoracic disc levels are normal. Postcontrast images of the thoracic spine are motion degraded but within that limitation there is no enhancing abnormality.  MRI LUMBAR SPINE FINDINGS Segmentation:  Standard. Alignment:  Physiologic. Vertebrae:  No fracture, evidence of discitis, or bone lesion. Conus medullaris: Extends to the L1 level and appears normal. Paraspinal and other soft tissues: Normal Disc levels: At L4-5, there is a small left subarticular disc protrusion mildly narrowing the left lateral recess. No central spinal canal or neural foraminal stenosis. The other lumbar disc levels are normal. IMPRESSION: 1. No evidence of discitis-osteomyelitis or epidural abscess. 2. Small left subarticular disc protrusion at T6-7 without spinal canal stenosis. 3. Small left subarticular disc protrusion at  L4-5 mildly narrowing the left lateral recess. Otherwise normal thoracic and lumbar spine. Electronically Signed   By: Ulyses Jarred M.D.   On: 12/17/2021 00:39   CT T-SPINE NO CHARGE  Result Date: 12/15/2021 CLINICAL DATA:  Chest pain, shortness of breath with pleuritic chest pain EXAM: CT ANGIOGRAPHY CHEST WITH CONTRAST TECHNIQUE: Multidetector CT imaging of the chest was performed using the standard protocol during bolus administration of intravenous contrast. Multiplanar CT image reconstructions and MIPs were obtained to evaluate the vascular anatomy. Additional CT imaging and reformats of the thoracic spine were performed. RADIATION DOSE REDUCTION: This exam was performed according to the departmental dose-optimization program which includes automated exposure control, adjustment of the mA and/or kV according to patient size and/or use of iterative reconstruction technique. CONTRAST:  163m OMNIPAQUE IOHEXOL 350 MG/ML SOLN COMPARISON:  CT scan of the thoracic spine FINDINGS: Cardiovascular: Satisfactory opacification of the pulmonary arteries to the segmental level. No evidence of pulmonary embolism. Normal heart size. No pericardial effusion. Mediastinum/Nodes: No enlarged mediastinal, hilar, or axillary lymph nodes. Thyroid gland, trachea, and esophagus demonstrate no significant findings. Lungs/Pleura: Mild volume loss with patchy bibasilar dependent airspace opacities. Findings are most suggestive of atelectasis. No focal airspace infiltrate to suggest pneumonia. No pleural effusion, pneumothorax or cavitary pulmonary nodules. Upper Abdomen: No acute abnormality. Incidental note made of replaced lateral segmental branch of the left hepatic artery. Musculoskeletal: No chest wall abnormality. No acute or significant osseous findings. Additional separately reconstructed dedicated CT images of the thoracic spine were also evaluated. No evidence of fracture, malalignment or significant degenerative change.  No level specific degenerative changes. No destructive changes, endplate sclerosis or soft tissue abnormality to suggest osteomyelitis/discitis. Review of the MIP images confirms the above findings. IMPRESSION: 1. Mild dependent bibasilar airspace opacities with associated volume loss favored to reflect atelectasis. Small volume aspiration is a consideration in the appropriate clinical context. 2. Otherwise, no acute cardiopulmonary process. 3. No evidence of fracture, malalignment or destructive process within the thoracic spine. Electronically Signed   By: HJacqulynn CadetM.D.   On: 12/15/2021 10:17   DG CHEST PORT 1 VIEW  Result Date: 12/15/2021 CLINICAL DATA:  Dyspnea EXAM: PORTABLE CHEST 1 VIEW COMPARISON:  09/29/2021 FINDINGS: Lungs are clear.  No pleural effusion or pneumothorax. The heart is normal in size. IMPRESSION: No evidence of acute cardiopulmonary disease. Electronically Signed   By: SJulian HyM.D.   On: 12/15/2021 00:16   DG Hand Complete Right  Result Date: 12/14/2021 CLINICAL DATA:  Pain and swelling. EXAM: RIGHT HAND - COMPLETE 3+ VIEW COMPARISON:  None Available. FINDINGS: The joint spaces are maintained. No acute bony findings or destructive bony changes. There is massive soft tissue swelling noted on the dorsum of the hand. No gas is seen in the soft tissues. No radiopaque foreign body. IMPRESSION: Massive soft tissue swelling but no acute bony findings. Electronically Signed   By: PRicky StabsD.  On: 12/14/2021 17:45   ECHOCARDIOGRAM COMPLETE  Result Date: 12/19/2021    ECHOCARDIOGRAM REPORT   Patient Name:   BOBBI KOZAKIEWICZ Date of Exam: 12/19/2021 Medical Rec #:  [id number]    Height:       74.0 in Accession #:    [id number]   Weight:       141.8 lb Date of Birth:  11-Aug-1990    BSA:          1.878 m Patient Age:    30 years     BP:           131/86 mmHg Patient Gender: M            HR:           54 bpm. Exam Location:  Inpatient Procedure: 2D Echo, Cardiac Doppler,  Color Doppler and Strain Analysis Indications:    Sepsis  History:        Patient has no prior history of Echocardiogram examinations.                 Signs/Symptoms:Shortness of Breath and Chest Pain; Risk                 Factors:Current Smoker.  Sonographer:    Luisa Hart RDCS Referring Phys: [id number] A CALDWELL POWELL [place]  1. Left ventricular ejection fraction, by estimation, is 55 to 60%. The left ventricle has normal function. The left ventricle has no regional wall motion abnormalities. Left ventricular diastolic parameters are consistent with Grade I diastolic dysfunction (impaired relaxation). The average left ventricular global longitudinal strain is -16.5 %. The global longitudinal strain is abnormal.  2. Right ventricular systolic function is normal. The right ventricular size is normal.  3. The mitral valve is normal in structure. Trivial mitral valve regurgitation.  4. The aortic valve is tricuspid. Aortic valve regurgitation is not visualized. No aortic stenosis is present.  5. The inferior vena cava is dilated in size with <50% respiratory variability, suggesting right atrial pressure of 15 mmHg. Comparison(s): No prior Echocardiogram. Conclusion(s)/Recommendation(s): No evidence of valvular vegetations on this transthoracic echocardiogram. Consider a transesophageal echocardiogram to exclude infective endocarditis if clinically indicated. FINDINGS  Left Ventricle: Left ventricular ejection fraction, by estimation, is 55 to 60%. The left ventricle has normal function. The left ventricle has no regional wall motion abnormalities. The average left ventricular global longitudinal strain is -16.5 %. The global longitudinal strain is abnormal. The left ventricular internal cavity size was normal in size. There is no left ventricular hypertrophy. Left ventricular diastolic parameters are consistent with Grade I diastolic dysfunction (impaired relaxation). Right Ventricle: The right ventricular  size is normal. No increase in right ventricular wall thickness. Right ventricular systolic function is normal. Left Atrium: Left atrial size was normal in size. Right Atrium: Right atrial size was normal in size. Pericardium: There is no evidence of pericardial effusion. Mitral Valve: The mitral valve is normal in structure. Trivial mitral valve regurgitation. Tricuspid Valve: The tricuspid valve is normal in structure. Tricuspid valve regurgitation is trivial. Aortic Valve: The aortic valve is tricuspid. Aortic valve regurgitation is not visualized. No aortic stenosis is present. Aortic valve mean gradient measures 4.0 mmHg. Aortic valve peak gradient measures 9.0 mmHg. Aortic valve area, by VTI measures 2.76 cm. Pulmonic Valve: The pulmonic valve was not well visualized. Pulmonic valve regurgitation is not visualized. Aorta: The aortic root is normal in size and structure. Venous: The inferior vena cava is dilated in size with less than  50% respiratory variability, suggesting right atrial pressure of 15 mmHg. IAS/Shunts: The atrial septum is grossly normal.  LEFT VENTRICLE PLAX 2D LVIDd:         4.90 cm     Diastology LVIDs:         3.10 cm     LV e' medial:    6.42 cm/s LV PW:         1.00 cm     LV E/e' medial:  6.7 LV IVS:        0.90 cm     LV e' lateral:   5.33 cm/s LVOT diam:     2.20 cm     LV E/e' lateral: 8.0 LV SV:         85 LV SV Index:   45          2D Longitudinal Strain LVOT Area:     3.80 cm    2D Strain GLS Avg:     -16.5 %  LV Volumes (MOD) LV vol d, MOD A2C: 73.4 ml LV vol d, MOD A4C: 91.7 ml LV vol s, MOD A2C: 30.1 ml LV vol s, MOD A4C: 37.5 ml LV SV MOD A2C:     43.3 ml LV SV MOD A4C:     91.7 ml LV SV MOD BP:      52.5 ml RIGHT VENTRICLE RV Basal diam:  4.20 cm RV Mid diam:    3.30 cm RV S prime:     17.60 cm/s TAPSE (M-mode): 3.0 cm LEFT ATRIUM             Index        RIGHT ATRIUM           Index LA diam:        2.70 cm 1.44 cm/m   RA Area:     17.70 cm LA Vol (A2C):   26.4 ml 14.06  ml/m  RA Volume:   50.90 ml  27.11 ml/m LA Vol (A4C):   24.3 ml 12.94 ml/m LA Biplane Vol: 25.3 ml 13.47 ml/m  AORTIC VALVE                    PULMONIC VALVE AV Area (Vmax):    3.12 cm     PV Vmax:       0.76 m/s AV Area (Vmean):   3.09 cm     PV Vmean:      50.400 cm/s AV Area (VTI):     2.76 cm     PV VTI:        0.159 m AV Vmax:           150.00 cm/s  PV Peak grad:  2.3 mmHg AV Vmean:          94.700 cm/s  PV Mean grad:  1.0 mmHg AV VTI:            0.309 m AV Peak Grad:      9.0 mmHg AV Mean Grad:      4.0 mmHg LVOT Vmax:         123.00 cm/s LVOT Vmean:        77.100 cm/s LVOT VTI:          0.224 m LVOT/AV VTI ratio: 0.72  AORTA Ao Asc diam: 2.50 cm MITRAL VALVE               TRICUSPID VALVE MV Area (PHT): 2.76 cm    TR Peak grad:  23.8 mmHg MV Decel Time: 275 msec    TR Vmax:        244.00 cm/s MV E velocity: 42.70 cm/s MV A velocity: 42.70 cm/s  SHUNTS MV E/A ratio:  1.00        Systemic VTI:  0.22 m                            Systemic Diam: 2.20 cm Gwyndolyn Kaufman MD Electronically signed by Gwyndolyn Kaufman MD Signature Date/Time: 12/19/2021/3:49:36 PM    Final    [place] RT UPPER EXTREM LTD SOFT TISSUE NON VASCULAR  Result Date: 12/15/2021 CLINICAL DATA:  31 year old with right upper extremity abscesses. EXAM: ULTRASOUND RIGHT UPPER EXTREMITY LIMITED TECHNIQUE: Ultrasound examination of the upper extremity soft tissues was performed in the area of clinical concern. COMPARISON:  None Available. FINDINGS: Complex hypoechoic heterogeneous structure in the dorsum of the right hand. This is most compatible with a complex fluid collection and abscess. The right hand structure measures 4.4 x 2.0 x 3.6 cm. Structure has peripheral blood flow but no significant internal vascularity. There is a smaller heterogeneous complex fluid collection in the anterior right shoulder region. Right shoulder structure measures 2.5 x 1.3 x 2.3 cm. This shoulder collection is superficial with peripheral vascularity.  IMPRESSION: Heterogeneous collection along the dorsum of the right hand and smaller collection in the anterior right shoulder. Findings are most compatible with superficial abscess collections. Electronically Signed   By: Markus Daft M.D.   On: 12/15/2021 10:51   ECHO TEE  Result Date: 12/21/2021    TRANSESOPHOGEAL ECHO REPORT   Patient Name:   TRYSTON GILLIAM Date of Exam: 12/21/2021 Medical Rec #:  [id number]    Height:       74.0 in Accession #:    [id number]   Weight:       141.8 lb Date of Birth:  [birth year]    BSA:          1.878 m Patient Age:    37 years     BP:           129/82 mmHg Patient Gender: M            HR:           61 bpm. Exam Location:  Inpatient Procedure: Transesophageal Echo, Cardiac Doppler and Color Doppler Indications:     Bacteremia  History:         Patient has prior history of Echocardiogram examinations, most                  recent 12/19/2021. Signs/Symptoms:Dyspnea; Risk Factors:Current                  Smoker. IVDU.  Sonographer:     Roseanna Rainbow RDCS Referring Phys:  [place] Diagnosing Phys: Oswaldo Milian MD PROCEDURE: After discussion of the risks and benefits of a TEE, an informed consent was obtained from the patient. The transesophogeal probe was passed without difficulty through the esophogus of the patient. Imaged were obtained with the patient in a left lateral decubitus position. Sedation performed by different physician. The patient was monitored while under deep sedation. Anesthestetic sedation was provided intravenously by Anesthesiology: 463m of Propofol, 1067mof Lidocaine. The patient's vital signs; including heart rate, blood pressure, and oxygen saturation; remained stable throughout the procedure. The patient developed no complications during the procedure. IMPRESSIONS  1. Left ventricular ejection fraction, by estimation,  is 55 to 60%. The left ventricle has normal function. The left ventricle has no regional wall motion abnormalities.  2. Right  ventricular systolic function is normal. The right ventricular size is mildly enlarged.  3. No left atrial/left atrial appendage thrombus was detected.  4. The mitral valve is normal in structure. Trivial mitral valve regurgitation.  5. The aortic valve is tricuspid. Aortic valve regurgitation is trivial. Conclusion(s)/Recommendation(s): No evidence of vegetation/infective endocarditis on this transesophageael echocardiogram. FINDINGS  Left Ventricle: Left ventricular ejection fraction, by estimation, is 55 to 60%. The left ventricle has normal function. The left ventricle has no regional wall motion abnormalities. The left ventricular internal cavity size was normal in size. Right Ventricle: The right ventricular size is mildly enlarged. No increase in right ventricular wall thickness. Right ventricular systolic function is normal. Left Atrium: Left atrial size was normal in size. No left atrial/left atrial appendage thrombus was detected. Right Atrium: Right atrial size was normal in size. Pericardium: There is no evidence of pericardial effusion. Mitral Valve: The mitral valve is normal in structure. Trivial mitral valve regurgitation. Tricuspid Valve: The tricuspid valve is normal in structure. Tricuspid valve regurgitation is trivial. Aortic Valve: The aortic valve is tricuspid. Aortic valve regurgitation is trivial. Pulmonic Valve: The pulmonic valve was grossly normal. Pulmonic valve regurgitation is trivial. Aorta: The aortic root is normal in size and structure. IAS/Shunts: No atrial level shunt detected by color flow Doppler. Oswaldo Milian MD Electronically signed by Oswaldo Milian MD Signature Date/Time: 12/21/2021/3:31:10 PM    Final     Microbiology: Results for orders placed or performed during the hospital encounter of 12/14/21  Blood culture (routine x 2)     Status: Abnormal   Collection Time: 12/14/21  5:25 PM   Specimen: BLOOD  Result Value Ref Range Status   Specimen  Description   Final    BLOOD LEFT ANTECUBITAL Performed at [facility], [place] [address], [place], [place] [address]    Special Requests   Final    BOTTLES DRAWN AEROBIC AND ANAEROBIC Blood Culture adequate volume Performed at [place] [address], [place], [place] [address]    Culture  Setup Time   Final    YEAST AEROBIC BOTTLE ONLY CRITICAL RESULT CALLED TO, READ BACK BY AND VERIFIED WITH: PHARMD J.LEGGE AT 4782 ON 12/18/2021 BY T.SAAD.    Culture (A)  Final    CANDIDA GLABRATA SEE SEPARATE REPORT Performed at [facility], [place] [address], [place], [place] [address]    Report Status 12/27/2021 FINAL  Final  Blood Culture ID Panel (Reflexed)     Status: Abnormal   Collection Time: 12/14/21  5:25 PM  Result Value Ref Range Status   Enterococcus faecalis NOT DETECTED NOT DETECTED Final   Enterococcus Faecium NOT DETECTED NOT DETECTED Final   Listeria monocytogenes NOT DETECTED NOT DETECTED Final   Staphylococcus species NOT DETECTED NOT DETECTED Final   Staphylococcus aureus (BCID) NOT DETECTED NOT DETECTED Final   Staphylococcus epidermidis NOT DETECTED NOT DETECTED Final   Staphylococcus lugdunensis NOT DETECTED NOT DETECTED Final   Streptococcus species NOT DETECTED NOT DETECTED Final   Streptococcus agalactiae NOT DETECTED NOT DETECTED Final   Streptococcus pneumoniae NOT DETECTED NOT DETECTED Final   Streptococcus pyogenes NOT DETECTED NOT DETECTED Final   A.calcoaceticus-baumannii NOT DETECTED NOT DETECTED Final   Bacteroides fragilis NOT DETECTED NOT DETECTED Final   Enterobacterales NOT DETECTED NOT DETECTED Final   Enterobacter cloacae complex NOT  DETECTED NOT DETECTED Final   Escherichia coli NOT DETECTED NOT DETECTED Final   Klebsiella aerogenes NOT DETECTED NOT DETECTED Final   Klebsiella oxytoca NOT DETECTED NOT DETECTED Final   Klebsiella pneumoniae NOT DETECTED NOT DETECTED Final   Proteus species NOT DETECTED  NOT DETECTED Final   Salmonella species NOT DETECTED NOT DETECTED Final   Serratia marcescens NOT DETECTED NOT DETECTED Final   Haemophilus influenzae NOT DETECTED NOT DETECTED Final   Neisseria meningitidis NOT DETECTED NOT DETECTED Final   Pseudomonas aeruginosa NOT DETECTED NOT DETECTED Final   Stenotrophomonas maltophilia NOT DETECTED NOT DETECTED Final   Candida albicans NOT DETECTED NOT DETECTED Final   Candida auris NOT DETECTED NOT DETECTED Final   Candida glabrata DETECTED (A) NOT DETECTED Final    Comment: CRITICAL RESULT CALLED TO, READ BACK BY AND VERIFIED WITH: PHARMD J.LEGGE AT 1721 ON 12/18/2021 BY T.SAAD.    Candida krusei NOT DETECTED NOT DETECTED Final   Candida parapsilosis NOT DETECTED NOT DETECTED Final   Candida tropicalis NOT DETECTED NOT DETECTED Final   Cryptococcus neoformans/gattii NOT DETECTED NOT DETECTED Final    Comment: Performed at [facility], [place] [address] [place], [place] [address]  Antifungal AST 9 Drug Panel     Status: None   Collection Time: 12/14/21  5:25 PM  Result Value Ref Range Status   Organism ID, Yeast Candida glabrata  Corrected    Comment: (NOTE) Identification performed by account, not confirmed by this laboratory. CORRECTED ON 05/29 AT 2325: PREVIOUSLY REPORTED AS Preliminary report    Amphotericin B MIC 1.0 ug/mL  Final    Comment: (NOTE) Breakpoints have been established for only some organism-drug combinations as indicated. This test was developed and its performance characteristics determined by [facility]. It has not been cleared or approved by the Food and Drug Administration.    Anidulafungin MIC Comment  Final    Comment: (NOTE) 0.12 ug/mL Susceptible Breakpoints have been established for only some organism-drug combinations as indicated. This test was developed and its performance characteristics determined by [facility]. It has not been cleared or approved by the Food and Drug Administration.    Caspofungin  MIC Comment  Final    Comment: (NOTE) 0.25 ug/mL Intermediate Breakpoints have been established for only some organism-drug combinations as indicated. This test was developed and its performance characteristics determined by [facility]. It has not been cleared or approved by the Food and Drug Administration.    Micafungin MIC Comment  Final    Comment: (NOTE) 0.03 ug/mL Susceptible Breakpoints have been established for only some organism-drug combinations as indicated. This test was developed and its performance characteristics determined by [facility]. It has not been cleared or approved by the Food and Drug Administration.    Posaconazole MIC 1.0 ug/mL  Final    Comment: (NOTE) Breakpoints have been established for only some organism-drug combinations as indicated. This test was developed and its performance characteristics determined by [facility]. It has not been cleared or approved by the Food and Drug Administration.    Fluconazole Islt MIC 8.0 ug/mL  Final    Comment: (NOTE) Susceptible Dose Dependent Breakpoints have been established for only some organism-drug combinations as indicated. This test was developed and its performance characteristics determined by [facility]. It has not been cleared or approved by the Food and Drug Administration.    Flucytosine MIC 0.06 ug/mL or less  Final    Comment: (NOTE) Breakpoints have been established for only some organism-drug combinations as indicated. This  test was developed and its performance characteristics determined by [facility]. It has not been cleared or approved by the Food and Drug Administration.    Itraconazole MIC 0.5 ug/mL  Final    Comment: (NOTE) Breakpoints have been established for only some organism-drug combinations as indicated. This test was developed and its performance characteristics determined by [facility]. It has not been cleared or approved by the Food and Drug Administration.    Voriconazole MIC 0.25  ug/mL  Final    Comment: (NOTE) Breakpoints have been established for only some organism-drug combinations as indicated. This test was developed and its performance characteristics determined by [facility]. It has not been cleared or approved by the Food and Drug Administration. Performed At: [facility] [place], [place] [id number] Rush Farmer MD PP:[id number]    Source [phone] BLD Meta Hatchet  Final    Comment: Performed at [facility], [place] [address], [place], [place] [address]  Blood culture (routine x 2)     Status: None   Collection Time: 12/14/21 10:30 PM   Specimen: BLOOD  Result Value Ref Range Status   Specimen Description   Final    BLOOD LEFT ARM Performed at [facility] [address], [place], [place] [address]    Special Requests   Final    BOTTLES DRAWN AEROBIC AND ANAEROBIC Blood Culture adequate volume Performed at [place] [address], [place], [place] [address]    Culture   Final    NO GROWTH 5 DAYS Performed at [facility], [place] [address] [place],  [address]    Report Status 12/20/2021 FINAL  Final  Resp Panel by RT-PCR (Flu A&B, Covid)     Status: None   Collection Time: 12/14/21 10:30 PM   Specimen: Nasopharyngeal(NP) swabs in vial transport medium  Result Value Ref Range Status   SARS Coronavirus 2 by RT PCR NEGATIVE NEGATIVE Final    Comment: (NOTE) SARS-CoV-2 target nucleic acids are NOT DETECTED.  The SARS-CoV-2 RNA is generally detectable in upper respiratory specimens during the acute phase of infection. The lowest concentration of SARS-CoV-2 viral copies this assay can detect is 138 copies/mL. A negative result does not preclude SARS-Cov-2 infection and should not be used as the sole basis for treatment or other patient management decisions. A negative result may occur with  improper specimen collection/handling, submission of specimen other than  nasopharyngeal swab, presence of viral mutation(s) within the areas targeted by this assay, and inadequate number of viral copies(<138 copies/mL). A negative result must be combined with clinical observations, patient history, and epidemiological information. The expected result is Negative.  Fact Sheet for Patients:  EntrepreneurPulse.com.au  Fact Sheet for Healthcare Providers:  IncredibleEmployment.be  This test is no t yet approved or cleared by the [place] FDA and  has been authorized for detection and/or diagnosis of SARS-CoV-2 by FDA under an Emergency Use Authorization (EUA). This EUA will remain  in effect (meaning this test can be used) for the duration of the COVID-19 declaration under Section 564(b)(1) of the Act, 21 U.S.C.section 360bbb-3(b)(1), unless the authorization is terminated  or revoked sooner.       Influenza A by PCR NEGATIVE NEGATIVE Final   Influenza B by PCR NEGATIVE NEGATIVE Final    Comment: (NOTE) The Xpert Xpress SARS-CoV-2/FLU/RSV plus assay is intended as an aid in the diagnosis of influenza from Nasopharyngeal swab specimens and should not be used as a sole basis  for treatment. Nasal washings and aspirates are unacceptable for Xpert Xpress SARS-CoV-2/FLU/RSV testing.  Fact Sheet for Patients: EntrepreneurPulse.com.au  Fact Sheet for Healthcare Providers: IncredibleEmployment.be  This test is not yet approved or cleared by the [place] FDA and has been authorized for detection and/or diagnosis of SARS-CoV-2 by FDA under an Emergency Use Authorization (EUA). This EUA will remain in effect (meaning this test can be used) for the duration of the COVID-19 declaration under Section 564(b)(1) of the Act, 21 U.S.C. section 360bbb-3(b)(1), unless the authorization is terminated or revoked.  Performed at [facility], [place] [address] [place], [place] [address]   Surgical PCR screen     Status: None   Collection Time: 12/15/21 12:58 PM   Specimen: Nasal Mucosa; Nasal Swab  Result Value Ref Range Status   MRSA, PCR NEGATIVE NEGATIVE Final   Staphylococcus aureus NEGATIVE NEGATIVE Final    Comment: (NOTE) The Xpert SA Assay (FDA approved for NASAL specimens in patients 59 years of age and older), is one component of a comprehensive surveillance program. It is not intended to diagnose infection nor to guide or monitor treatment. Performed at [facility], [place] [address], [place], [place] [address]   Aerobic/Anaerobic Culture w Gram Stain (surgical/deep wound)     Status: None   Collection Time: 12/15/21  5:06 PM   Specimen: PATH Other; Tissue  Result Value Ref Range Status   Specimen Description   Final    TISSUE Performed at [place] [address], [place], [place] [address]    Special Requests   Final    NONE SHOULDER ABSCESS Performed at [facility], [place] [address] [place], [place] [address]    Gram Stain   Final    ABUNDANT WBC PRESENT, PREDOMINANTLY PMN FEW GRAM POSITIVE COCCI    Culture   Final    ABUNDANT STREPTOCOCCUS INTERMEDIUS NO ANAEROBES ISOLATED Performed at [facility], [place] [address], [place], [place] [address]    Report Status 12/20/2021 FINAL  Final   Organism ID, Bacteria STREPTOCOCCUS INTERMEDIUS  Final      Susceptibility   Streptococcus intermedius - MIC*    PENICILLIN <=0.06 SENSITIVE Sensitive     CEFTRIAXONE <=0.12 SENSITIVE Sensitive     ERYTHROMYCIN 4 RESISTANT Resistant     LEVOFLOXACIN <=0.25 SENSITIVE Sensitive     VANCOMYCIN 0.25 SENSITIVE Sensitive     * ABUNDANT STREPTOCOCCUS INTERMEDIUS  Aerobic/Anaerobic Culture w Gram Stain (surgical/deep wound)     Status: None   Collection Time: 12/15/21  5:06 PM   Specimen: PATH Other; Tissue  Result Value Ref Range Status   Specimen Description   Final     TISSUE Performed at [place] [address] [place], [place] [address]    Special Requests   Final    NONE HAND ABSCESS Performed at [facility], [place] [address] [place], [place] [address]    Gram Stain   Final    ABUNDANT WBC PRESENT, PREDOMINANTLY PMN FEW GRAM POSITIVE COCCI RARE GRAM NEGATIVE RODS    Culture   Final    ABUNDANT STREPTOCOCCUS INTERMEDIUS FEW HAEMOPHILUS PARAINFLUENZAE BETA LACTAMASE NEGATIVE ABUNDANT PREVOTELLA DENTICOLA BETA LACTAMASE POSITIVE Performed at [facility], [address] [place],  [address]    Report Status 12/18/2021 FINAL  Final   Organism ID, Bacteria STREPTOCOCCUS INTERMEDIUS  Final      Susceptibility   Streptococcus intermedius - MIC*  PENICILLIN <=0.06 SENSITIVE Sensitive     CEFTRIAXONE <=0.12 SENSITIVE Sensitive     ERYTHROMYCIN 4 RESISTANT Resistant     LEVOFLOXACIN <=0.25 SENSITIVE Sensitive     VANCOMYCIN 0.25 SENSITIVE Sensitive     * ABUNDANT STREPTOCOCCUS INTERMEDIUS  Urine Culture     Status: None   Collection Time: 12/17/21  8:07 AM   Specimen: Urine, Clean Catch  Result Value Ref Range Status   Specimen Description   Final    URINE, CLEAN CATCH Performed at [facility], [place] [address] [place], [place] [address]    Special Requests   Final    NONE Performed at [facility], [place] [address] [place], [place] [address]    Culture   Final    NO GROWTH Performed at [facility], [place] [address], [place], [place] [address]    Report Status 12/18/2021 FINAL  Final  Culture, blood (Routine X 2) w Reflex to ID Panel     Status: None   Collection Time: 12/20/21  3:47 PM   Specimen: BLOOD  Result Value Ref Range Status   Specimen Description   Final    BLOOD RIGHT ANTECUBITAL Performed at [facility] [address], [place], [place] [address]    Special Requests   Final    IN PEDIATRIC BOTTLE  Blood Culture adequate volume Performed at [facility] [address], [place], [place] [address]    Culture   Final    NO GROWTH 5 DAYS Performed at [facility], [place] [address], [place], [place] [address]    Report Status 12/25/2021 FINAL  Final  Culture, blood (Routine X 2) w Reflex to ID Panel     Status: None   Collection Time: 12/20/21  3:47 PM   Specimen: BLOOD  Result Value Ref Range Status   Specimen Description   Final    BLOOD RIGHT ANTECUBITAL LOWER Performed at [place] [address] [place], [place] [address]    Special Requests   Final    IN PEDIATRIC BOTTLE Blood Culture adequate volume Performed at [place] [address], [place], [place] [address]    Culture   Final    NO GROWTH 5 DAYS Performed at [facility], [place] [address], [place], [place] [address]    Report Status 12/25/2021 FINAL  Final    Labs: CBC: Recent Labs  Lab 12/21/21 1651 12/28/21 0827  WBC 9.9 6.8  NEUTROABS 5.9  --   HGB 11.6* 14.4  HCT 35.1* 44.4  MCV 86.9 86.9  PLT 369 PLATELET CLUMPS NOTED ON SMEAR, UNABLE TO ESTIMATE   Basic Metabolic Panel: Recent Labs  Lab 12/21/21 1651 12/23/21 0959 12/24/21 0506 12/25/21 0638 12/28/21 0827  NA 139 140 138 135 140  K 3.9 4.5 4.3 4.5 4.8  CL 103 106 102 100 105  CO2 _0 GLUCOSE 83 127* 95 110* 100*  BUN 14 22* 22* 27* 29*  CREATININE 0.96 1.02 0.87 0.81 0.93  CALCIUM 9.1 9.2 9.6 9.6 10.3  MG 2.2 2.4  --   --   --   PHOS 3.9  --   --   --   --    Liver Function Tests: Recent Labs  Lab 12/21/21 1651  AST 34  ALT 30  ALKPHOS 46  BILITOT 0.5  PROT 7.3  ALBUMIN 3.2*   CBG: No results for  input(s): GLUCAP in the last 168 hours.  Discharge time spent: greater than 30 minutes.  Signed: Elmarie Shiley, MD [facility] 12/28/2021

## 2021-12-29 ENCOUNTER — Other Ambulatory Visit (HOSPITAL_COMMUNITY): Payer: Self-pay

## 2021-12-30 ENCOUNTER — Other Ambulatory Visit (HOSPITAL_COMMUNITY): Payer: Self-pay

## 2022-01-30 ENCOUNTER — Observation Stay (HOSPITAL_COMMUNITY)
Admit: 2022-01-30 | Discharge: 2022-01-30 | Disposition: A | Discharge status: Home / Self Care | Payer: 59 | Attending: Emergency Medicine | Admitting: Emergency Medicine

## 2022-01-30 ENCOUNTER — Emergency Department (HOSPITAL_COMMUNITY): Payer: 59

## 2022-01-30 ENCOUNTER — Inpatient Hospital Stay (HOSPITAL_COMMUNITY)
Admission: EM | Admit: 2022-01-30 | Discharge: 2022-02-06 | DRG: 100 | Disposition: A | Discharge status: Home / Self Care | Payer: 59 | Attending: Internal Medicine | Admitting: Internal Medicine

## 2022-01-30 ENCOUNTER — Observation Stay (HOSPITAL_COMMUNITY): Payer: 59

## 2022-01-30 ENCOUNTER — Encounter (HOSPITAL_COMMUNITY): Payer: Self-pay

## 2022-01-30 ENCOUNTER — Other Ambulatory Visit: Payer: Self-pay

## 2022-01-30 DIAGNOSIS — Z91038 Other insect allergy status: Secondary | ICD-10-CM

## 2022-01-30 DIAGNOSIS — F1990 Other psychoactive substance use, unspecified, uncomplicated: Secondary | ICD-10-CM | POA: Diagnosis present

## 2022-01-30 DIAGNOSIS — K5900 Constipation, unspecified: Secondary | ICD-10-CM | POA: Diagnosis present

## 2022-01-30 DIAGNOSIS — Z5320 Procedure and treatment not carried out because of patient's decision for unspecified reasons: Secondary | ICD-10-CM | POA: Diagnosis not present

## 2022-01-30 DIAGNOSIS — Z720 Tobacco use: Secondary | ICD-10-CM | POA: Diagnosis present

## 2022-01-30 DIAGNOSIS — E872 Acidosis, unspecified: Secondary | ICD-10-CM | POA: Diagnosis present

## 2022-01-30 DIAGNOSIS — Z6821 Body mass index (BMI) 21.0-21.9, adult: Secondary | ICD-10-CM

## 2022-01-30 DIAGNOSIS — R569 Unspecified convulsions: Principal | ICD-10-CM

## 2022-01-30 DIAGNOSIS — G9341 Metabolic encephalopathy: Secondary | ICD-10-CM | POA: Diagnosis present

## 2022-01-30 DIAGNOSIS — Z79899 Other long term (current) drug therapy: Secondary | ICD-10-CM

## 2022-01-30 DIAGNOSIS — R7303 Prediabetes: Secondary | ICD-10-CM | POA: Diagnosis present

## 2022-01-30 DIAGNOSIS — Z886 Allergy status to analgesic agent status: Secondary | ICD-10-CM

## 2022-01-30 DIAGNOSIS — F061 Catatonic disorder due to known physiological condition: Secondary | ICD-10-CM | POA: Insufficient documentation

## 2022-01-30 DIAGNOSIS — R509 Fever, unspecified: Secondary | ICD-10-CM | POA: Diagnosis not present

## 2022-01-30 DIAGNOSIS — F1193 Opioid use, unspecified with withdrawal: Secondary | ICD-10-CM

## 2022-01-30 DIAGNOSIS — R7881 Bacteremia: Secondary | ICD-10-CM

## 2022-01-30 DIAGNOSIS — B182 Chronic viral hepatitis C: Secondary | ICD-10-CM | POA: Diagnosis present

## 2022-01-30 DIAGNOSIS — M79641 Pain in right hand: Secondary | ICD-10-CM | POA: Diagnosis present

## 2022-01-30 DIAGNOSIS — F17210 Nicotine dependence, cigarettes, uncomplicated: Secondary | ICD-10-CM | POA: Diagnosis present

## 2022-01-30 DIAGNOSIS — F1120 Opioid dependence, uncomplicated: Secondary | ICD-10-CM

## 2022-01-30 DIAGNOSIS — F419 Anxiety disorder, unspecified: Secondary | ICD-10-CM | POA: Diagnosis present

## 2022-01-30 DIAGNOSIS — Z888 Allergy status to other drugs, medicaments and biological substances status: Secondary | ICD-10-CM

## 2022-01-30 DIAGNOSIS — J45909 Unspecified asthma, uncomplicated: Secondary | ICD-10-CM | POA: Diagnosis present

## 2022-01-30 DIAGNOSIS — Z20822 Contact with and (suspected) exposure to covid-19: Secondary | ICD-10-CM | POA: Diagnosis present

## 2022-01-30 DIAGNOSIS — F1123 Opioid dependence with withdrawal: Secondary | ICD-10-CM | POA: Diagnosis not present

## 2022-01-30 DIAGNOSIS — E440 Moderate protein-calorie malnutrition: Secondary | ICD-10-CM | POA: Diagnosis present

## 2022-01-30 HISTORY — DX: Unspecified convulsions: R56.9

## 2022-01-30 LAB — CBC WITH DIFFERENTIAL/PLATELET
Abs Immature Granulocytes: 0.07 10*3/uL (ref 0.00–0.07)
Basophils Absolute: 0 10*3/uL (ref 0.0–0.1)
Basophils Relative: 0 %
Eosinophils Absolute: 0 10*3/uL (ref 0.0–0.5)
Eosinophils Relative: 0 %
HCT: 39.9 % (ref 39.0–52.0)
Hemoglobin: 13.4 g/dL (ref 13.0–17.0)
Immature Granulocytes: 1 %
Lymphocytes Relative: 13 %
Lymphs Abs: 1.8 10*3/uL (ref 0.7–4.0)
MCH: 28.2 pg (ref 26.0–34.0)
MCHC: 33.6 g/dL (ref 30.0–36.0)
MCV: 83.8 fL (ref 80.0–100.0)
Monocytes Absolute: 0.6 10*3/uL (ref 0.1–1.0)
Monocytes Relative: 4 %
Neutro Abs: 11.3 10*3/uL — ABNORMAL HIGH (ref 1.7–7.7)
Neutrophils Relative %: 82 %
Platelets: 293 10*3/uL (ref 150–400)
RBC: 4.76 MIL/uL (ref 4.22–5.81)
RDW: 14.2 % (ref 11.5–15.5)
WBC: 13.8 10*3/uL — ABNORMAL HIGH (ref 4.0–10.5)
nRBC: 0 % (ref 0.0–0.2)

## 2022-01-30 LAB — URINALYSIS, ROUTINE W REFLEX MICROSCOPIC
Bilirubin Urine: NEGATIVE
Glucose, UA: NEGATIVE mg/dL
Hgb urine dipstick: NEGATIVE
Ketones, ur: NEGATIVE mg/dL
Leukocytes,Ua: NEGATIVE
Nitrite: NEGATIVE
Protein, ur: NEGATIVE mg/dL
Specific Gravity, Urine: 1.015 (ref 1.005–1.030)
pH: 5 (ref 5.0–8.0)

## 2022-01-30 LAB — PHOSPHORUS: Phosphorus: 2.7 mg/dL (ref 2.5–4.6)

## 2022-01-30 LAB — COMPREHENSIVE METABOLIC PANEL
ALT: 42 U/L (ref 0–44)
AST: 36 U/L (ref 15–41)
Albumin: 4.4 g/dL (ref 3.5–5.0)
Alkaline Phosphatase: 78 U/L (ref 38–126)
Anion gap: 9 (ref 5–15)
BUN: 20 mg/dL (ref 6–20)
CO2: 26 mmol/L (ref 22–32)
Calcium: 9.8 mg/dL (ref 8.9–10.3)
Chloride: 103 mmol/L (ref 98–111)
Creatinine, Ser: 1.23 mg/dL (ref 0.61–1.24)
GFR, Estimated: >60 mL/min (ref >60)
Glucose, Bld: 107 mg/dL — ABNORMAL HIGH (ref 70–99)
Potassium: 4.2 mmol/L (ref 3.5–5.1)
Sodium: 138 mmol/L (ref 135–145)
Total Bilirubin: 0.7 mg/dL (ref 0.3–1.2)
Total Protein: 8.5 g/dL — ABNORMAL HIGH (ref 6.5–8.1)

## 2022-01-30 LAB — SARS CORONAVIRUS 2 BY RT PCR: SARS Coronavirus 2 by RT PCR: NEGATIVE

## 2022-01-30 LAB — RAPID URINE DRUG SCREEN, HOSP PERFORMED
Amphetamines: NOT DETECTED
Barbiturates: NOT DETECTED
Benzodiazepines: POSITIVE — AB
Cocaine: NOT DETECTED
Opiates: POSITIVE — AB
Tetrahydrocannabinol: POSITIVE — AB

## 2022-01-30 LAB — LACTIC ACID, PLASMA
Lactic Acid, Venous: 2.4 mmol/L (ref 0.5–1.9)
Lactic Acid, Venous: 2.3 mmol/L (ref 0.5–1.9)

## 2022-01-30 LAB — HEMOGLOBIN A1C
Hgb A1c MFr Bld: 5.6 % (ref 4.8–5.6)
Mean Plasma Glucose: 114.02 mg/dL

## 2022-01-30 LAB — SEDIMENTATION RATE: Sed Rate: 30 mm/hr — ABNORMAL HIGH (ref 0–16)

## 2022-01-30 LAB — MAGNESIUM: Magnesium: 2.2 mg/dL (ref 1.7–2.4)

## 2022-01-30 LAB — PROTIME-INR
INR: 1 (ref 0.8–1.2)
INR: 1.1 (ref 0.8–1.2)
Prothrombin Time: 13.3 seconds (ref 11.4–15.2)
Prothrombin Time: 14.2 seconds (ref 11.4–15.2)

## 2022-01-30 LAB — ETHANOL: Alcohol, Ethyl (B): <10 mg/dL (ref <10)

## 2022-01-30 LAB — APTT: aPTT: 28 seconds (ref 24–36)

## 2022-01-30 LAB — C-REACTIVE PROTEIN: CRP: 0.5 mg/dL (ref <1.0)

## 2022-01-30 LAB — HIV ANTIBODY (ROUTINE TESTING W REFLEX): HIV Screen 4th Generation wRfx: NONREACTIVE

## 2022-01-30 MED ORDER — ACETAMINOPHEN 500 MG PO TABS
500.0000 mg | ORAL_TABLET | Freq: Once | ORAL | Status: AC
  Administered 2022-01-30: 500 mg via ORAL
  Filled 2022-01-30: qty 1

## 2022-01-30 MED ORDER — ONDANSETRON HCL 4 MG PO TABS
4.0000 mg | ORAL_TABLET | Freq: Four times a day (QID) | ORAL | Status: DC | PRN
  Administered 2022-01-31 – 2022-02-04 (×2): 4 mg via ORAL
  Filled 2022-01-30 (×2): qty 1

## 2022-01-30 MED ORDER — ONDANSETRON HCL 4 MG/2ML IJ SOLN
4.0000 mg | Freq: Four times a day (QID) | INTRAMUSCULAR | Status: DC | PRN
  Administered 2022-01-30 – 2022-01-31 (×3): 4 mg via INTRAVENOUS
  Filled 2022-01-30 (×3): qty 2

## 2022-01-30 MED ORDER — VANCOMYCIN HCL 1750 MG/350ML IV SOLN
1750.0000 mg | Freq: Once | INTRAVENOUS | Status: AC
  Administered 2022-01-30: 1750 mg via INTRAVENOUS
  Filled 2022-01-30 (×2): qty 350

## 2022-01-30 MED ORDER — VANCOMYCIN HCL IN DEXTROSE 1-5 GM/200ML-% IV SOLN
1000.0000 mg | Freq: Two times a day (BID) | INTRAVENOUS | Status: DC
  Administered 2022-01-31 – 2022-02-01 (×3): 1000 mg via INTRAVENOUS
  Filled 2022-01-30 (×3): qty 200

## 2022-01-30 MED ORDER — PIPERACILLIN-TAZOBACTAM 3.375 G IVPB
3.3750 g | Freq: Three times a day (TID) | INTRAVENOUS | Status: DC
  Administered 2022-01-30 – 2022-01-31 (×2): 3.375 g via INTRAVENOUS
  Filled 2022-01-30 (×2): qty 50

## 2022-01-30 MED ORDER — LORAZEPAM 2 MG/ML IJ SOLN
1.0000 mg | Freq: Once | INTRAMUSCULAR | Status: AC
  Administered 2022-01-30: 1 mg via INTRAVENOUS
  Filled 2022-01-30: qty 1

## 2022-01-30 MED ORDER — IOHEXOL 300 MG/ML  SOLN
100.0000 mL | Freq: Once | INTRAMUSCULAR | Status: AC | PRN
  Administered 2022-01-30: 100 mL via INTRAVENOUS

## 2022-01-30 MED ORDER — THIAMINE HCL 100 MG PO TABS
100.0000 mg | ORAL_TABLET | Freq: Every day | ORAL | Status: DC
  Administered 2022-01-30 – 2022-02-03 (×3): 100 mg via ORAL
  Filled 2022-01-30 (×6): qty 1

## 2022-01-30 MED ORDER — BUPRENORPHINE HCL-NALOXONE HCL 2-0.5 MG SL SUBL
1.0000 | SUBLINGUAL_TABLET | SUBLINGUAL | Status: AC | PRN
  Administered 2022-01-30: 1 via SUBLINGUAL
  Filled 2022-01-30: qty 1

## 2022-01-30 MED ORDER — ACETAMINOPHEN 500 MG PO TABS
1000.0000 mg | ORAL_TABLET | Freq: Three times a day (TID) | ORAL | Status: DC | PRN
  Administered 2022-01-31: 1000 mg via ORAL
  Filled 2022-01-30: qty 2

## 2022-01-30 MED ORDER — BUPRENORPHINE HCL-NALOXONE HCL 8-2 MG SL SUBL
1.0000 | SUBLINGUAL_TABLET | Freq: Two times a day (BID) | SUBLINGUAL | Status: DC
  Administered 2022-01-31 – 2022-02-01 (×3): 1 via SUBLINGUAL
  Filled 2022-01-30 (×4): qty 1

## 2022-01-30 MED ORDER — HYDROXYZINE HCL 25 MG PO TABS
25.0000 mg | ORAL_TABLET | Freq: Four times a day (QID) | ORAL | Status: DC | PRN
  Administered 2022-01-30 – 2022-01-31 (×2): 25 mg via ORAL
  Filled 2022-01-30 (×3): qty 1

## 2022-01-30 MED ORDER — ONDANSETRON HCL 4 MG/2ML IJ SOLN
4.0000 mg | Freq: Once | INTRAMUSCULAR | Status: AC
  Administered 2022-01-30: 4 mg via INTRAVENOUS
  Filled 2022-01-30: qty 2

## 2022-01-30 MED ORDER — LACTATED RINGERS IV BOLUS
1000.0000 mL | Freq: Once | INTRAVENOUS | Status: AC
  Administered 2022-01-30: 1000 mL via INTRAVENOUS

## 2022-01-30 MED ORDER — BISACODYL 5 MG PO TBEC
5.0000 mg | DELAYED_RELEASE_TABLET | Freq: Two times a day (BID) | ORAL | Status: DC | PRN
  Administered 2022-01-31: 5 mg via ORAL
  Filled 2022-01-30: qty 1

## 2022-01-30 MED ORDER — BUPRENORPHINE HCL-NALOXONE HCL 8-2 MG SL SUBL
1.0000 | SUBLINGUAL_TABLET | Freq: Two times a day (BID) | SUBLINGUAL | Status: DC
  Administered 2022-01-30: 1 via SUBLINGUAL
  Filled 2022-01-30: qty 1

## 2022-01-30 MED ORDER — ACETAMINOPHEN 500 MG PO TABS
500.0000 mg | ORAL_TABLET | Freq: Four times a day (QID) | ORAL | Status: DC | PRN
Start: 2022-01-30 — End: 2022-01-30
  Administered 2022-01-30: 500 mg via ORAL
  Filled 2022-01-30: qty 1

## 2022-01-30 MED ORDER — NICOTINE 21 MG/24HR TD PT24
21.0000 mg | MEDICATED_PATCH | Freq: Every day | TRANSDERMAL | Status: DC
Start: 2022-01-30 — End: 2022-02-06
  Administered 2022-01-30 – 2022-02-06 (×8): 21 mg via TRANSDERMAL
  Filled 2022-01-30 (×8): qty 1

## 2022-01-30 MED ORDER — ACETAMINOPHEN 500 MG PO TABS
1000.0000 mg | ORAL_TABLET | Freq: Once | ORAL | Status: AC
  Administered 2022-01-30: 1000 mg via ORAL
  Filled 2022-01-30: qty 2

## 2022-01-30 MED ORDER — ADULT MULTIVITAMIN W/MINERALS CH
1.0000 | ORAL_TABLET | Freq: Every day | ORAL | Status: DC
  Administered 2022-01-30 – 2022-02-03 (×3): 1 via ORAL
  Filled 2022-01-30 (×6): qty 1

## 2022-01-30 MED ORDER — POLYETHYLENE GLYCOL 3350 17 G PO PACK
17.0000 g | PACK | Freq: Every day | ORAL | Status: DC | PRN
Start: 2022-01-30 — End: 2022-02-06
  Administered 2022-01-30: 17 g via ORAL
  Filled 2022-01-30: qty 1

## 2022-01-30 MED ORDER — ENOXAPARIN SODIUM 40 MG/0.4ML IJ SOSY
40.0000 mg | PREFILLED_SYRINGE | Freq: Every day | INTRAMUSCULAR | Status: DC
  Administered 2022-01-31 – 2022-02-03 (×4): 40 mg via SUBCUTANEOUS
  Filled 2022-01-30 (×6): qty 0.4

## 2022-01-30 MED ORDER — SODIUM CHLORIDE 0.9 % IV SOLN
2.0000 g | Freq: Three times a day (TID) | INTRAVENOUS | Status: DC
  Administered 2022-01-30: 2 g via INTRAVENOUS
  Filled 2022-01-30: qty 12.5

## 2022-01-30 NOTE — Progress Notes (Signed)
TOC CSW has attached substance abuse resources to pt's AVS.  Jaxsun Ciampi M.Aleina Burgio, MSW, LCSWA [facility]  Transitions of Care Clinical Social Worker I Direct Dial: [phone]  Fax: [phone] [email or web address]  

## 2022-01-30 NOTE — ED Notes (Signed)
Pt was taken off of the bedpan and he was unable to use the restroom.

## 2022-01-30 NOTE — Progress Notes (Signed)
Pharmacy Antibiotic Note  Stephen Shepard is a 31 y.o. male admitted on 01/30/2022 with sepsis.  Pharmacy has been consulted for vanc/zosyn dosing.  Plan: Vanc 1750mg  IV x 1 then 1g IV q12 - goal AUC 400-550 Zosyn 3.375g IV q8 (extended interval infusion) Daily SCr  Height: 6\' 2"  (188 cm) Weight: 74.8 kg (165 lb) IBW/kg (Calculated) : 82.2  Temp (24hrs), Avg:97.7 F (36.5 C), Min:97.7 F (36.5 C), Max:97.7 F (36.5 C)  Recent Labs  Lab 01/30/22 0627 01/30/22 0827  WBC 13.8*  --   CREATININE 1.23  --   LATICACIDVEN 2.4* 2.3*     Estimated Creatinine Clearance: 91.2 mL/min (by C-G formula based on SCr of 1.23 mg/dL).    Allergies  Allergen Reactions   Ibuprofen Anaphylaxis   Keppra [Levetiracetam] Anaphylaxis   Wasp Venom Protein Swelling    hives   Nitroglycerin Other (See Comments)    Pt reports "real bad chest pain"     Thank you for allowing pharmacy to be a part of this patient's care.  04/02/22 01/30/2022 3:23 PM

## 2022-01-30 NOTE — Progress Notes (Signed)
Off site [facility] EEG complete - results pending.

## 2022-01-30 NOTE — ED Provider Notes (Signed)
Patient here after possible seizure event.  This is a shared visit.  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  Patient states that he woke up this morning with some several episodes of shaking.  Did not think he lost consciousness but he is not sure.  He is having pain all over.  Last used IV drugs 2 days ago.  He was recently admitted for hand infection.  Had surgery and completed antibiotics.  During his hospital stay he was on Suboxone.  Per chart review he was supposed to maybe get arranged to follow-up at a Suboxone clinic but this was not done.  He overall appears neurologically intact.  He is feeling aches all over, nausea.  Vital signs are unremarkable.  Overall suspect that if he did have a seizure event likely secondary to polysubstance abuse.  He likely is withdrawing from opioids.  Have low concern for infectious process.  Have no concern for meningitis.  Blood work per my review and interpretation shows no significant leukocytosis or anemia or electrolyte abnormality.  Head CT is unremarkable.  Surgical site in the right hand appears well.  EKG shows sinus rhythm.  No ischemic changes.  Overall talked with neurology, Dr. Ace Gins and we will admit for observation, EEG, MRI brain.  We will hold off on any antiepileptics unless we notice/see seizure activity.  Suspect that if he did have seizures today was secondary to drugs.  He denies any alcohol use.  Ethanol levels normal.  Otherwise was restarted back on Suboxone.  Would benefit from social work/case management to help arrange for Suboxone clinic/rehab.  This chart was dictated using voice recognition software.  Despite best efforts to proofread,  errors can occur which can change the documentation meaning.    Virgina Norfolk, DO 01/30/22 1038

## 2022-01-30 NOTE — ED Notes (Signed)
Patient transported to CT 

## 2022-01-30 NOTE — ED Notes (Signed)
Pt's right hand rewrapped with non-adherent dressing and guaze.
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
## 2022-01-30 NOTE — ED Notes (Signed)
Pt to CT
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
## 2022-01-30 NOTE — ED Provider Notes (Addendum)
[facility]-EMERGENCY DEPT Provider Note   CSN: [id number] Arrival date & time: 01/30/22  1594     History  Chief Complaint  Patient presents with   Seizures    Stephen Shepard is a 31 y.o. male.  HPI 30 year old male with history of seizures as an adolescent, not on antiepileptics, anxiety, asthma, substance abuse, recent admission for multiple right upper extremity abscess, s/p I&D of the right shoulder and right wrist, r multiple right hand compartment tenosynovectomy on 5/18 with Dr. Orlan Leavens and Candida glabrata fungemia presents to the ER via EMS with "15 minutes of shaking".  Patient reports reportedly sleeping when he awoke from a nightmare, and proceeded to have shaking, kicking, sweating for approximate 15 minutes.  Reportedly bit his girlfriend's finger during this episode.  Patient does not remember this event.  He reports using IV fentanyl approximately 2 days ago because he was not sent home with pain medication (per chart review, was sent home with Suboxone which the patient has not taken) and states "what you expect me to do for pain control".  He states that he finished the course of fluconazole and has follow-up with orthopedics on 7/7.  EMS did not note any seizure activity.  He states he had 2 seizures as a 31 year old but has not had any seizures since and is not on any antiepileptics.  He currently alert and oriented, but states he feels like he got "hit by a truck" endorsing body aches.  He has not unwrapped his right hand for several weeks and states that he "cannot look at it".  He continues to complain of pain in the right hand.    Home Medications Prior to Admission medications   Medication Sig Start Date End Date Taking? Authorizing Provider  acetaminophen (TYLENOL) 500 MG tablet Take 500 mg by mouth every 6 (six) hours as needed for moderate pain.    [provider]  hydrOXYzine (ATARAX) 25 MG tablet Take 1 tablet (25 mg total) by mouth  every 6 (six) hours as needed for anxiety. 12/28/21   Regalado, Belkys A, MD  lidocaine (LIDODERM) 5 % Place 1 patch onto the skin daily. Remove & Discard patch within 12 hours or as directed by MD Patient not taking: Reported on 12/14/2021 09/29/21   Lorre Nick, MD  Multiple Vitamin (MULTIVITAMIN WITH MINERALS) TABS tablet Take 1 tablet by mouth daily. 12/29/21   Regalado, Belkys A, MD  nicotine (NICODERM CQ - DOSED IN MG/24 HOURS) 21 mg/24hr patch Place 1 patch (21 mg total) onto the skin daily. 12/29/21   Regalado, Belkys A, MD  thiamine 100 MG tablet Take 1 tablet (100 mg total) by mouth daily. 12/29/21   Regalado, Belkys A, MD      Allergies    Ibuprofen, Other, Wasp venom protein, and Nitroglycerin    Review of Systems   Review of Systems Ten systems reviewed and are negative for acute change, except as noted in the HPI.   Physical Exam Updated Vital Signs BP (!) 150/71   Pulse 94   Temp 97.7 F (36.5 C) (Oral)   Resp (!) 26   Ht 6\' 2"  (1.88 m)   Wt 74.8 kg   SpO2 100%   BMI 21.18 kg/m  Physical Exam Vitals and nursing note reviewed.  Constitutional:      General: He is not in acute distress.    Appearance: He is well-developed.  HENT:     Head: Normocephalic and atraumatic.  Eyes:  Conjunctiva/sclera: Conjunctivae normal.  Cardiovascular:     Rate and Rhythm: Normal rate and regular rhythm.     Heart sounds: No murmur heard. Pulmonary:     Effort: Pulmonary effort is normal. No respiratory distress.     Breath sounds: Normal breath sounds.  Abdominal:     Palpations: Abdomen is soft.     Tenderness: There is no abdominal tenderness.  Musculoskeletal:        General: No swelling.     Cervical back: Neck supple.     Comments: Right hand with visible surgical scar, no overlying erythema, warmth, fluctuance, swelling.  Able to move all 5 digits without difficulty.  Overlying scab over surgical scar with no active drainage  Skin:    General: Skin is warm and dry.      Capillary Refill: Capillary refill takes less than 2 seconds.  Neurological:     General: No focal deficit present.     Mental Status: He is alert and oriented to person, place, and time.     Sensory: No sensory deficit.     Motor: No weakness.     Comments: Alert and oriented, answering questions appropriately, resting calmly in the ER bed, moving all 4 extremities without difficulty  Psychiatric:        Mood and Affect: Mood normal.      ED Results / Procedures / Treatments   Labs (all labs ordered are listed, but only abnormal results are displayed) Labs Reviewed  LACTIC ACID, PLASMA - Abnormal; Notable for the following components:      Result Value   Lactic Acid, Venous 2.4 (*)    All other components within normal limits  COMPREHENSIVE METABOLIC PANEL - Abnormal; Notable for the following components:   Glucose, Bld 107 (*)    Total Protein 8.5 (*)    All other components within normal limits  CBC WITH DIFFERENTIAL/PLATELET - Abnormal; Notable for the following components:   WBC 13.8 (*)    Neutro Abs 11.3 (*)    All other components within normal limits  CULTURE, BLOOD (ROUTINE X 2)  CULTURE, BLOOD (ROUTINE X 2)  URINE CULTURE  FUNGUS CULTURE, BLOOD  PROTIME-INR  APTT  ETHANOL  LACTIC ACID, PLASMA  URINALYSIS, ROUTINE W REFLEX MICROSCOPIC  RAPID URINE DRUG SCREEN, HOSP PERFORMED  PROLACTIN    EKG EKG Interpretation  Date/Time:  Monday January 30 2022 06:27:58 EDT Ventricular Rate:  72 PR Interval:  158 QRS Duration: 83 QT Interval:  407 QTC Calculation: 446 R Axis:   -88 Text Interpretation: Sinus rhythm LAD, consider left anterior fascicular block Confirmed by Kommor, Madison ([id number]) on 01/30/2022 6:40:35 AM  Radiology CT Head Wo Contrast  Result Date: 01/30/2022 CLINICAL DATA:  seizure like activity, h/o fungemia EXAM: CT HEAD WITHOUT CONTRAST TECHNIQUE: Contiguous axial images were obtained from the base of the skull through the vertex without intravenous  contrast. RADIATION DOSE REDUCTION: This exam was performed according to the departmental dose-optimization program which includes automated exposure control, adjustment of the mA and/or kV according to patient size and/or use of iterative reconstruction technique. COMPARISON:  2015 FINDINGS: Brain: There is no acute intracranial hemorrhage, mass effect, or edema. Gray-white differentiation is preserved. There is no extra-axial fluid collection. Ventricles and sulci are within normal limits in size and configuration. Vascular: No hyperdense vessel or unexpected calcification. Skull: Calvarium is unremarkable. Sinuses/Orbits: No acute finding. Other: None. IMPRESSION: No acute intracranial abnormality. Electronically Signed   By: Addison Lank.D.  On: 01/30/2022 08:10   DG Hand Complete Right  Result Date: 01/30/2022 CLINICAL DATA:  31 year old male with pain. Skin wound with scab across metacarpals. EXAM: RIGHT HAND - COMPLETE 3+ VIEW COMPARISON:  Right hand and wrist series 12/14/2021. FINDINGS: Bone mineralization is within normal limits. There is no evidence of fracture or dislocation. There is no evidence of arthropathy or other focal bone abnormality. Soft tissue swelling has regressed since May. There is residual dorsal soft tissue irregularity. No radiopaque foreign body identified. No soft tissue gas identified. IMPRESSION: 1. Regressed soft tissue swelling since May with residual dorsal soft tissue irregularity. 2. No acute osseous abnormality identified in the right hand. Electronically Signed   By: Genevie Ann M.D.   On: 01/30/2022 07:56   DG Chest Port 1 View  Result Date: 01/30/2022 CLINICAL DATA:  Questionable sepsis. History of IV heroin drug abuse. EXAM: PORTABLE CHEST 1 VIEW COMPARISON:  10/15/2021 FINDINGS: The heart size and mediastinal contours are within normal limits. Both lungs are clear. The visualized skeletal structures are unremarkable. IMPRESSION: No active disease. Electronically  Signed   By: Kerby Moors M.D.   On: 01/30/2022 07:26    Procedures Procedures    Medications Ordered in ED Medications  buprenorphine-naloxone (SUBOXONE) 8-2 mg per SL tablet 1 tablet (1 tablet Sublingual Given 01/30/22 0931)  lactated ringers bolus 1,000 mL (0 mLs Intravenous Stopped 01/30/22 1022)  LORazepam (ATIVAN) injection 1 mg (1 mg Intravenous Given 01/30/22 0844)  acetaminophen (TYLENOL) tablet 1,000 mg (1,000 mg Oral Given 01/30/22 0925)  ondansetron (ZOFRAN) injection 4 mg (4 mg Intravenous Given 01/30/22 0931)  LORazepam (ATIVAN) injection 1 mg (1 mg Intravenous Given 01/30/22 1019)    ED Course/ Medical Decision Making/ A&P                           Medical Decision Making Amount and/or Complexity of Data Reviewed Labs: ordered. Radiology: ordered. ECG/medicine tests: ordered.  Risk OTC drugs. Prescription drug management. Decision regarding hospitalization.   This patient presents to the ED for concern of shaking/seizure like activity this involves a number of treatment options, and is a complaint that carries with it a high risk of complications and morbidity.  The differential diagnosis includes seizure,  meningitis, withdrawal, sepsis, brain abscess, brain mass, stroke, ICH   Co morbidities: Discussed in HPI   Brief History:  31 year old male presenting with 15 minutes of seizure like activity after waking up in the middle of the night.  Reports using IV fentanyl 2 days ago.  Unclear if he can remember the event, to me he states he cannot remember the event but to EMS he states that he could.  He has a recent history of multiple right arm abscesses and fungemia infection.  He states he finished the course of fluconazole he was prescribed at discharge.  Currently endorses body aches but no other symptoms.  EMR reviewed including pt PMHx, past surgical history and past visits to ER.   See HPI for more details   Lab Tests:  I ordered and independently interpreted  labs.  The pertinent results include:    Labs notable for CBC with a leukocytosis of 13.8 and neutrophil count of 11.3.  CMP without any significant abnormalities.  Lactic acid of 2.4, question infectious versus reactive to drug use.  Ethanol negative.  Blood culture and fungal culture pending  Imaging Studies: I personally reviewed all imaging studies and agree with radiology review.  CT of the head without any acute abnormalities, right hand x-ray with improved swelling.  Chest x-ray without any acute abnormalities.   Cardiac Monitoring:  The patient was maintained on a cardiac monitor.  I personally viewed and interpreted the cardiac monitored which showed an underlying rhythm of: EKG non-ischemic,NS rhythm   Medicines ordered:  I ordered medication including Tylenol, Suboxone, Ativan, LR bolus for lactic acidosis, pain ,nausea Reevaluation of the patient after these medicines showed that the patient improved I have reviewed the patients home medicines and have made adjustments as needed   Critical Interventions:    Consults:  I requested consultation with Dr. Mamie Nick and discussed lab and imaging findings as well as pertinent plan - they recommend: admission for MRI of the brain, CT of the cervical spine, EEG, substance abuse counseling. If workup negative, can f/u w/ neurology OP     Reevaluation:  After the interventions noted above I re-evaluated patient and found that they have :improved   Social Determinants of Health:  Recurrent IV drug use    Problem List / ED Course:  31 year old male here with seizure-like activity in the setting of recent fentanyl use.  Recent admission for fungemia as well.  On arrival, he is alert and oriented, able to answer questions appropriately.  He did have difficulty getting out of bed and had an ataxic gait which per chart review appears to be present on prior admission but did improve on its own.  Patient reports a history of  seizures as a child but none recently.  Lab work significant for a leukocytosis of 13.8 and a lactic acidosis of 2.4, question infectious versus reactivity to overall symptoms are likely due to withdrawal however it is hard to determine if the patient truly had a seizure. Low suspicion for meningitis or other infectious process at this time. Dr. Lockie Mola discussed the patient's care with Dr. Derry Lory with neurology, who recommends admission for MRI of the brain, EEG, and a CT of the cervical spine. Restarted on home Suboxone. No indication for anti-epileptics at this time. Will send prolactin level.    Dispostion:  After consideration of the diagnostic results and the patients response to treatment, I feel that the patent would benefit from admission for MRI, EEG, and substance abuse counseling/rehab. Consulted hospitalist for admission   Final Clinical Impression(s) / ED Diagnoses Final diagnoses:  Seizure-like activity [facility])    Rx / DC Orders ED Discharge Orders     None           Mare Ferrari, PA-C 01/30/22 1047    Virgina Norfolk, DO 01/30/22 1059

## 2022-01-30 NOTE — Progress Notes (Deleted)
Pharmacy Antibiotic Note  Orlando Devereux is a 31 y.o. male admitted on 01/30/2022 with sepsis.  Pharmacy has been consulted for vanc/zosyn dosing.  Plan: Vanc 1750mg  IV x 1 then 1g IV q12 - goal AUC 400-550 Cefepime 2g IV q8 per current renal function  Height: 6\' 2"  (188 cm) Weight: 74.8 kg (165 lb) IBW/kg (Calculated) : 82.2  Temp (24hrs), Avg:97.7 F (36.5 C), Min:97.7 F (36.5 C), Max:97.7 F (36.5 C)  Recent Labs  Lab 01/30/22 0627 01/30/22 0827  WBC 13.8*  --   CREATININE 1.23  --   LATICACIDVEN 2.4* 2.3*    Estimated Creatinine Clearance: 91.2 mL/min (by C-G formula based on SCr of 1.23 mg/dL).    Allergies  Allergen Reactions   Ibuprofen Anaphylaxis   Other Anaphylaxis    Kepra   Wasp Venom Protein Swelling    hives   Nitroglycerin Other (See Comments)    Pt reports "real bad chest pain"     Thank you for allowing pharmacy to be a part of this patient's care.  04/02/22 01/30/2022 12:45 PM

## 2022-01-30 NOTE — ED Notes (Signed)
Pt stated he had to use the restroom and staff attempted to assist pt in ambulating. He was unable to ambulate. He was seated back on the bed and placed on CCM. He was also placed on the bedpan. He was given privacy and was told staff would check on him shortly. He also agreed to an USGIV.

## 2022-01-30 NOTE — Procedures (Signed)
Patient Name: Kadien Lineman  MRN: [id number]  Epilepsy Attending: Charlsie Quest  Referring Physician/Provider: Virgina Norfolk, DO Date: 01/30/2022 Duration: 21.59 mins  Patient history: 31 year old male with seizure-like activity.  EEG to evaluate for seizure.  Level of alertness: Awake  AEDs during EEG study: None  Technical aspects: This EEG study was done with scalp electrodes positioned according to the 10-20 International system of electrode placement. Electrical activity was acquired at a sampling rate of 500Hz  and reviewed with a high frequency filter of 70Hz  and a low frequency filter of 1Hz . EEG data were recorded continuously and digitally stored.   Description: The posterior dominant rhythm consists of 7 Hz activity of moderate voltage (25-35 uV) seen predominantly in posterior head regions, symmetric and reactive to eye opening and eye closing.  Hyperventilation and photic stimulation were not performed.     ABNORMALITY - Background slow  IMPRESSION: This study is suggestive of mild diffuse encephalopathy, nonspecific etiology. No seizures or epileptiform discharges were seen throughout the recording.  Ayza Ripoll 

## 2022-01-31 DIAGNOSIS — G9341 Metabolic encephalopathy: Secondary | ICD-10-CM | POA: Diagnosis present

## 2022-01-31 DIAGNOSIS — F061 Catatonic disorder due to known physiological condition: Secondary | ICD-10-CM | POA: Diagnosis not present

## 2022-01-31 DIAGNOSIS — B182 Chronic viral hepatitis C: Secondary | ICD-10-CM | POA: Diagnosis present

## 2022-01-31 DIAGNOSIS — R7303 Prediabetes: Secondary | ICD-10-CM | POA: Diagnosis present

## 2022-01-31 DIAGNOSIS — Z5320 Procedure and treatment not carried out because of patient's decision for unspecified reasons: Secondary | ICD-10-CM | POA: Diagnosis not present

## 2022-01-31 DIAGNOSIS — M79641 Pain in right hand: Secondary | ICD-10-CM | POA: Diagnosis present

## 2022-01-31 DIAGNOSIS — F202 Catatonic schizophrenia: Secondary | ICD-10-CM | POA: Diagnosis not present

## 2022-01-31 DIAGNOSIS — I339 Acute and subacute endocarditis, unspecified: Secondary | ICD-10-CM | POA: Diagnosis not present

## 2022-01-31 DIAGNOSIS — E440 Moderate protein-calorie malnutrition: Secondary | ICD-10-CM | POA: Diagnosis present

## 2022-01-31 DIAGNOSIS — E872 Acidosis, unspecified: Secondary | ICD-10-CM | POA: Diagnosis present

## 2022-01-31 DIAGNOSIS — J45909 Unspecified asthma, uncomplicated: Secondary | ICD-10-CM | POA: Diagnosis present

## 2022-01-31 DIAGNOSIS — F17210 Nicotine dependence, cigarettes, uncomplicated: Secondary | ICD-10-CM | POA: Diagnosis present

## 2022-01-31 DIAGNOSIS — R509 Fever, unspecified: Secondary | ICD-10-CM | POA: Insufficient documentation

## 2022-01-31 DIAGNOSIS — Z20822 Contact with and (suspected) exposure to covid-19: Secondary | ICD-10-CM | POA: Diagnosis present

## 2022-01-31 DIAGNOSIS — F419 Anxiety disorder, unspecified: Secondary | ICD-10-CM | POA: Diagnosis present

## 2022-01-31 DIAGNOSIS — I809 Phlebitis and thrombophlebitis of unspecified site: Secondary | ICD-10-CM | POA: Diagnosis not present

## 2022-01-31 DIAGNOSIS — F1120 Opioid dependence, uncomplicated: Secondary | ICD-10-CM

## 2022-01-31 DIAGNOSIS — F1193 Opioid use, unspecified with withdrawal: Secondary | ICD-10-CM

## 2022-01-31 DIAGNOSIS — Z79899 Other long term (current) drug therapy: Secondary | ICD-10-CM | POA: Diagnosis not present

## 2022-01-31 DIAGNOSIS — R569 Unspecified convulsions: Secondary | ICD-10-CM | POA: Diagnosis present

## 2022-01-31 DIAGNOSIS — Z91038 Other insect allergy status: Secondary | ICD-10-CM | POA: Diagnosis not present

## 2022-01-31 DIAGNOSIS — Z886 Allergy status to analgesic agent status: Secondary | ICD-10-CM | POA: Diagnosis not present

## 2022-01-31 DIAGNOSIS — F1123 Opioid dependence with withdrawal: Secondary | ICD-10-CM | POA: Diagnosis not present

## 2022-01-31 DIAGNOSIS — K5900 Constipation, unspecified: Secondary | ICD-10-CM | POA: Diagnosis present

## 2022-01-31 DIAGNOSIS — Z6821 Body mass index (BMI) 21.0-21.9, adult: Secondary | ICD-10-CM | POA: Diagnosis not present

## 2022-01-31 DIAGNOSIS — Z888 Allergy status to other drugs, medicaments and biological substances status: Secondary | ICD-10-CM | POA: Diagnosis not present

## 2022-01-31 DIAGNOSIS — R7881 Bacteremia: Secondary | ICD-10-CM

## 2022-01-31 DIAGNOSIS — F1990 Other psychoactive substance use, unspecified, uncomplicated: Secondary | ICD-10-CM | POA: Diagnosis not present

## 2022-01-31 HISTORY — DX: Bacteremia: R78.81

## 2022-01-31 LAB — BLOOD CULTURE ID PANEL (REFLEXED) - BCID2

## 2022-01-31 LAB — CBC
HCT: 41.8 % (ref 39.0–52.0)
Hemoglobin: 14.2 g/dL (ref 13.0–17.0)
MCH: 28.3 pg (ref 26.0–34.0)
MCHC: 34 g/dL (ref 30.0–36.0)
MCV: 83.3 fL (ref 80.0–100.0)
Platelets: 248 10*3/uL (ref 150–400)
RBC: 5.02 MIL/uL (ref 4.22–5.81)
RDW: 14.4 % (ref 11.5–15.5)
WBC: 11.1 10*3/uL — ABNORMAL HIGH (ref 4.0–10.5)
nRBC: 0 % (ref 0.0–0.2)

## 2022-01-31 LAB — COMPREHENSIVE METABOLIC PANEL
ALT: 40 U/L (ref 0–44)
AST: 39 U/L (ref 15–41)
Albumin: 4.2 g/dL (ref 3.5–5.0)
Alkaline Phosphatase: 75 U/L (ref 38–126)
Anion gap: 11 (ref 5–15)
BUN: 20 mg/dL (ref 6–20)
CO2: 25 mmol/L (ref 22–32)
Calcium: 9.4 mg/dL (ref 8.9–10.3)
Chloride: 105 mmol/L (ref 98–111)
Creatinine, Ser: 1.31 mg/dL — ABNORMAL HIGH (ref 0.61–1.24)
GFR, Estimated: >60 mL/min (ref >60)
Glucose, Bld: 112 mg/dL — ABNORMAL HIGH (ref 70–99)
Potassium: 3.9 mmol/L (ref 3.5–5.1)
Sodium: 141 mmol/L (ref 135–145)
Total Bilirubin: 1 mg/dL (ref 0.3–1.2)
Total Protein: 8.5 g/dL — ABNORMAL HIGH (ref 6.5–8.1)

## 2022-01-31 LAB — PROLACTIN: Prolactin: 14.6 ng/mL (ref 4.0–15.2)

## 2022-01-31 MED ORDER — ACETAMINOPHEN 325 MG PO TABS
650.0000 mg | ORAL_TABLET | Freq: Four times a day (QID) | ORAL | Status: DC
  Administered 2022-01-31 – 2022-02-05 (×15): 650 mg via ORAL
  Filled 2022-01-31 (×19): qty 2

## 2022-01-31 NOTE — Assessment & Plan Note (Addendum)
Patient initially was experiencing symptoms of opiate withdrawal and was started on Suboxone.  When he subsequently became catatonic this was stoppped, and as of today he has no active symptoms of withdrawal.    Suboxone would be a reasonable risk reduction treatment for opiate dependence at the time of discharge.  Patient has been counseled to find a substance use treatment center that can prescribe Suboxone after discharge

## 2022-01-31 NOTE — Hospital Course (Addendum)
[facility] is a 31 y.o. M with hx IVDU, recent admission for hand tenosynovitis and candida fungemia who presented with seizure like activity.  The patient provides inconsistent versions of the history to different providers, but conservatively, it appears he may have had a period of loss of consciousness and uncontrolled shaking while waking up.  In the ER, MRI brain normal.  He was in withdrawal and was started on Suboxone and admitted for seizure eval.     7/3: Admitted and started on Suboxone, MRI brain normal 7/4: Fevered again, blood cultures with 1/3 positive for S epi, EEG normal 7/5: Mental status change, CTH normal, refused LP 7/6: ID and Neuro consulted; appeared to have positive response to Ativan trial 7/7: With staff, patient disorganized and paranoid, In the afternoon, patient lucid and oriented with significant other, [name] 7/8: Mental status improving but still residual paranoia is marked --> Ativan stopped, Gabapentin started 7/9: Mental status improving, still some paranoia, better overall; Linezolid completed

## 2022-01-31 NOTE — Assessment & Plan Note (Addendum)
Patient has [facility] appt on 7/11

## 2022-01-31 NOTE — Assessment & Plan Note (Addendum)
Nicotine patch and gum offered

## 2022-01-31 NOTE — Assessment & Plan Note (Addendum)
The patient was discharged from the hospital 2 months ago with a probucol hand infection and with Candida fungemia.  He completed all the 3 days of both treatments in the hospital, and I think even if he did not adhere to the treatment regimens for both last time, it is unlikely he has residual fungemia.  Presented here with fever, cultures this admission with 1 of 3 cultures growing Staph epidermidis and Staph capitis and urine with 5K cfus S epi as well.  The hand was evaluated and is definitely not infected.  He has no other joint pains or skin lesions that could be a focus of infection.  CXR clear and CT abdomen and pelvis without focal findings on admission.  Attempted LP but patient refused. Meningitis doubted.  He was evaluated by ID who felt likely his fever was not infectious, but recmomended completing 7 days antibiotics with Linezolid, to complete tonight Sunday.    - If he had repeat fever, would need repeat cultures and TTE

## 2022-01-31 NOTE — Progress Notes (Addendum)
PHARMACY - PHYSICIAN COMMUNICATION CRITICAL VALUE ALERT - BLOOD CULTURE IDENTIFICATION (BCID)  [facility] is an 31 y.o. male who presented to [facility] on 01/30/2022 with a chief complaint of episodes of "shaking". Recent admission in May 2023 with multiple skin abscesses and underwent I&D + tenosynovectomy.  Completed Augmentin on 6/1  for polymicrobial hand infection + micafungin >> fluconazole (at discharge) on 6/6 for C. Glabrata bacteremia.    Hand culture 12/15/21: abundant strep intermedius, few H.parainfluenzae, abundant prevotella denticola  Assessment:  1/7 bottles growing Gram positive cocci. BCID detects Staph epi with MecA resistance.  Possible contaminant, however patient has been febrile (Tm 101.8) - question if from infection vs withdrawal symptoms. WBC slightly elevated at 11.1.    Name of physician (or Provider) Contacted: Dr. Maryfrances Bunnell  Current antibiotics: Vancomycin 1000mg  IV q12 hours + Zosyn 3/375g IV q8 hours (extended-infusion)  Changes to prescribed antibiotics recommended:  Zosyn discontinued this AM Can keep vancomycin for now and monitor clinical improvement. Consider ID input to determine if residual hand infection.   Results for orders placed or performed during the hospital encounter of 12/14/21  Blood Culture ID Panel (Reflexed) (Collected: 12/14/2021  5:25 PM)  Result Value Ref Range   Enterococcus faecalis NOT DETECTED NOT DETECTED   Enterococcus Faecium NOT DETECTED NOT DETECTED   Listeria monocytogenes NOT DETECTED NOT DETECTED   Staphylococcus species NOT DETECTED NOT DETECTED   Staphylococcus aureus (BCID) NOT DETECTED NOT DETECTED   Staphylococcus epidermidis NOT DETECTED NOT DETECTED   Staphylococcus lugdunensis NOT DETECTED NOT DETECTED   Streptococcus species NOT DETECTED NOT DETECTED   Streptococcus agalactiae NOT DETECTED NOT DETECTED   Streptococcus pneumoniae NOT DETECTED NOT DETECTED   Streptococcus pyogenes NOT DETECTED NOT DETECTED    A.calcoaceticus-baumannii NOT DETECTED NOT DETECTED   Bacteroides fragilis NOT DETECTED NOT DETECTED   Enterobacterales NOT DETECTED NOT DETECTED   Enterobacter cloacae complex NOT DETECTED NOT DETECTED   Escherichia coli NOT DETECTED NOT DETECTED   Klebsiella aerogenes NOT DETECTED NOT DETECTED   Klebsiella oxytoca NOT DETECTED NOT DETECTED   Klebsiella pneumoniae NOT DETECTED NOT DETECTED   Proteus species NOT DETECTED NOT DETECTED   Salmonella species NOT DETECTED NOT DETECTED   Serratia marcescens NOT DETECTED NOT DETECTED   Haemophilus influenzae NOT DETECTED NOT DETECTED   Neisseria meningitidis NOT DETECTED NOT DETECTED   Pseudomonas aeruginosa NOT DETECTED NOT DETECTED   Stenotrophomonas maltophilia NOT DETECTED NOT DETECTED   Candida albicans NOT DETECTED NOT DETECTED   Candida auris NOT DETECTED NOT DETECTED   Candida glabrata DETECTED (A) NOT DETECTED   Candida krusei NOT DETECTED NOT DETECTED   Candida parapsilosis NOT DETECTED NOT DETECTED   Candida tropicalis NOT DETECTED NOT DETECTED   Cryptococcus neoformans/gattii NOT DETECTED NOT DETECTED    12/16/2021, PharmD 01/31/2022 8:07 AM

## 2022-01-31 NOTE — Assessment & Plan Note (Addendum)
This was the initial presenting complaint.    However, MRI normal. EEG normal. No GTC seizures in the hospital.  Repeat EEG again normal after change in MS. I agree with neuro, there is no role for AEDs at this time.    No further siezures

## 2022-01-31 NOTE — Progress Notes (Signed)
Progress Note   Patient: Stephen Shepard [id number]:[id number] DOB: 1990/12/31 DOA: 01/30/2022     0 DOS: the patient was seen and examined on 01/31/2022 at 11:30AM      Brief hospital course: Stephen Shepard is a 30 y.o. M with hx IVDU, recent admission for hand tenosynovitis and candida fungemia who presented with seizure like activity.  The patient provides inconsistent versions of the history to different providers, but conservatively, it appears he may have had a period of loss of consciousness and uncontrolled shaking while waking up.  In the ER, MRI brain normal.  He was in withdrawal and was started on Suboxone and admitted for seizure eval.       Assessment and Plan: * Seizure-like activity (HCC) MRI normal.  Event occurred in the setting of opiate withdrawal.  If EEG normal, agree with neurology as.  ED provider discussion, will defer antiepileptics. - Follow EEG report   Positive blood culture The patient was discharged from the hospital 2 months ago with a probucol hand infection and with Candida fungemia.  He completed all the 3 days of both treatments in the hospital, and I think even if he did not adhere to the treatment regimens for both last time, it is unlikely he has residual fungemia.  Cultures now with 1 of 3 cultures growing Staph epidermidis and urine with <50K cfus S epi as well. The hand was evaluated and is definitely not infected.  He has no other joint pains or skin lesions that could be a focus of infection.  CXR clear and CT abdomen and pelvis without focal findings. - Continue vancomycin - Follow cultures  Opioid withdrawal (HCC) - Continue Suboxone 8 BID - Continue hydroxyzine, ondansetron as needed  Fever See above  Malnutrition of moderate degree    Chronic hepatitis C without hepatic coma (HCC) Patient has [facility] appt on 7/11   Tobacco abuse    Prediabetes    IVDU (intravenous drug user) - Consult TOC for assistance with Suboxone references so  patient can make an appointment after discharge          Subjective: Patient had a fever yesterday afternoon, he has had no confusion, no vomiting, no further seizures, no respiratory distress, no cough.  He has no focal skin lesions, his right hand has some contractures, but no redness or swelling.     Physical Exam: Vitals:   01/30/22 2009 01/31/22 0020 01/31/22 0420 01/31/22 1124  BP: 132/83 121/66 135/89 (!) 122/104  Pulse: 60 61 (!) 57 (!) 56  Resp: 20 (!) 22 (!) 25 20  Temp: (!) 101.1 F (38.4 C) (!) 100.9 F (38.3 C) 99.8 F (37.7 C) 98.3 F (36.8 C)  TempSrc: Oral Oral Oral Oral  SpO2: 99% 99% 99% 99%  Weight:      Height:       Thin adult male, lying in bed, appears uncomfortable RRR, no murmurs, no peripheral edema Respiratory rate normal, lungs clear without rales or wheezes The right hand is unwrapped, this is diffuse hyper algesia, and decreased range of motion, but no redness, no induration, no fluctuance, no evidence of infection Abdomen soft no tenderness palpation or guarding, no ascites or distention Attention normal, affect anxious and uncomfortable and stilted, judgment insight appear moderately impaired, short-term memory seems slightly impaired, face symmetric, speech fluent, moves upper extremities with generalized weakness but symmetric strength  Data Reviewed: MRI brain report reviewed, unremarkable CRP normal CBC shows white blood cell count down to 13 Lactic acid  over 2, now resolved Basic metabolic panel normal Urine drug screen shows opiates, benzodiazepines, THC  Family Communication: Partner Stephen Shepard at the bedside    Disposition: Status is: Inpatient         Author: Alberteen Sam, MD 01/31/2022 2:18 PM  For on call review [email or web address].

## 2022-01-31 NOTE — Assessment & Plan Note (Addendum)
-   Consult TOC for assistance with Suboxone references so patient can make an appointment after discharge - Continue to encourage patient to avoid use of illicit substances

## 2022-02-01 ENCOUNTER — Inpatient Hospital Stay (HOSPITAL_COMMUNITY): Payer: 59

## 2022-02-01 DIAGNOSIS — B182 Chronic viral hepatitis C: Secondary | ICD-10-CM | POA: Diagnosis not present

## 2022-02-01 DIAGNOSIS — F061 Catatonic disorder due to known physiological condition: Secondary | ICD-10-CM | POA: Insufficient documentation

## 2022-02-01 DIAGNOSIS — F419 Anxiety disorder, unspecified: Secondary | ICD-10-CM | POA: Diagnosis not present

## 2022-02-01 DIAGNOSIS — R569 Unspecified convulsions: Secondary | ICD-10-CM | POA: Diagnosis not present

## 2022-02-01 DIAGNOSIS — G9341 Metabolic encephalopathy: Secondary | ICD-10-CM | POA: Diagnosis not present

## 2022-02-01 LAB — COMPREHENSIVE METABOLIC PANEL
ALT: 40 U/L (ref 0–44)
AST: 50 U/L — ABNORMAL HIGH (ref 15–41)
Albumin: 4.4 g/dL (ref 3.5–5.0)
Alkaline Phosphatase: 70 U/L (ref 38–126)
Anion gap: 9 (ref 5–15)
BUN: 23 mg/dL — ABNORMAL HIGH (ref 6–20)
CO2: 26 mmol/L (ref 22–32)
Calcium: 9.6 mg/dL (ref 8.9–10.3)
Chloride: 107 mmol/L (ref 98–111)
Creatinine, Ser: 1.13 mg/dL (ref 0.61–1.24)
GFR, Estimated: >60 mL/min (ref >60)
Glucose, Bld: 109 mg/dL — ABNORMAL HIGH (ref 70–99)
Potassium: 3.8 mmol/L (ref 3.5–5.1)
Sodium: 142 mmol/L (ref 135–145)
Total Bilirubin: 1.1 mg/dL (ref 0.3–1.2)
Total Protein: 8.9 g/dL — ABNORMAL HIGH (ref 6.5–8.1)

## 2022-02-01 LAB — CBC
HCT: 44.2 % (ref 39.0–52.0)
Hemoglobin: 15.1 g/dL (ref 13.0–17.0)
MCH: 28.4 pg (ref 26.0–34.0)
MCHC: 34.2 g/dL (ref 30.0–36.0)
MCV: 83.1 fL (ref 80.0–100.0)
Platelets: 239 10*3/uL (ref 150–400)
RBC: 5.32 MIL/uL (ref 4.22–5.81)
RDW: 14.5 % (ref 11.5–15.5)
WBC: 11 10*3/uL — ABNORMAL HIGH (ref 4.0–10.5)
nRBC: 0 % (ref 0.0–0.2)

## 2022-02-01 LAB — URINE CULTURE: Culture: 3000 — AB

## 2022-02-01 MED ORDER — SODIUM CHLORIDE 0.9 % IV BOLUS
1000.0000 mL | Freq: Once | INTRAVENOUS | Status: AC
  Administered 2022-02-01: 1000 mL via INTRAVENOUS

## 2022-02-01 MED ORDER — VANCOMYCIN HCL 1250 MG/250ML IV SOLN
1250.0000 mg | Freq: Two times a day (BID) | INTRAVENOUS | Status: DC
  Administered 2022-02-01 – 2022-02-04 (×7): 1250 mg via INTRAVENOUS
  Filled 2022-02-01 (×7): qty 250

## 2022-02-01 MED ORDER — BUPRENORPHINE HCL-NALOXONE HCL 8-2 MG SL SUBL
1.0000 | SUBLINGUAL_TABLET | Freq: Every day | SUBLINGUAL | Status: DC
  Administered 2022-02-02 – 2022-02-03 (×2): 1 via SUBLINGUAL
  Filled 2022-02-01 (×2): qty 1

## 2022-02-01 MED ORDER — HYDROXYZINE HCL 10 MG PO TABS
10.0000 mg | ORAL_TABLET | Freq: Once | ORAL | Status: AC
Start: 2022-02-02 — End: 2022-02-02
  Administered 2022-02-02: 10 mg via ORAL
  Filled 2022-02-01: qty 1

## 2022-02-01 MED ORDER — POTASSIUM CHLORIDE 2 MEQ/ML IV SOLN
INTRAVENOUS | Status: DC
  Filled 2022-02-01 (×9): qty 1000

## 2022-02-01 NOTE — TOC Initial Note (Signed)
Transition of Care [facility]) - Initial/Assessment Note    Patient Details  Name: Stephen Shepard MRN: [id number] Date of Birth: 1991-01-09  Transition of Care [facility]) CM/SW Contact:    Armanda Heritage, RN Phone Number: 02/01/2022, 10:00 AM  Clinical Narrative:                 CM reviewed chart, noted TOC consult for SA resources for saboxone.  Resources added to AVS for patient use post discharge.  Expected Discharge Plan: Home/Self Care Barriers to Discharge: Continued Medical Work up   Patient Goals and CMS Choice Patient states their goals for this hospitalization and ongoing recovery are:: to go home      Expected Discharge Plan and Services Expected Discharge Plan: Home/Self Care   Discharge Planning Services: CM Consult                                          Prior Living Arrangements/Services   Lives with:: Self Patient language and need for interpreter reviewed:: Yes Do you feel safe going back to the place where you live?: Yes      Need for Family Participation in Patient Care: No (Comment) Care giver support system in place?: Yes (comment)   Criminal Activity/Legal Involvement Pertinent to Current Situation/Hospitalization: No - Comment as needed  Activities of Daily Living Home Assistive Devices/Equipment: None ADL Screening (condition at time of admission) Patient's cognitive ability adequate to safely complete daily activities?: Yes Is the patient deaf or have difficulty hearing?: No Does the patient have difficulty seeing, even when wearing glasses/contacts?: No Does the patient have difficulty concentrating, remembering, or making decisions?: No Patient able to express need for assistance with ADLs?: Yes Does the patient have difficulty dressing or bathing?: No Independently performs ADLs?: Yes (appropriate for developmental age) Does the patient have difficulty walking or climbing stairs?: No Weakness of Legs: Both Weakness of Arms/Hands:  Both  Permission Sought/Granted                  Emotional Assessment Appearance:: Appears stated age     Orientation: : Oriented to Self, Oriented to Place, Oriented to  Time, Oriented to Situation Alcohol / Substance Use: Alcohol Use Psych Involvement: No (comment)  Admission diagnosis:  Seizure-like activity (HCC) [R56.9] Fever [R50.9] Patient Active Problem List   Diagnosis Date Noted   Acute metabolic encephalopathy 02/01/2022   Positive blood culture 01/31/2022   Fever 01/31/2022   Opioid use disorder, severe, dependence (HCC) 01/31/2022   Opioid withdrawal (HCC) 01/31/2022   Seizure-like activity (HCC) 01/30/2022   Malnutrition of moderate degree 12/22/2021   Candidemia (HCC) 12/19/2021   Unsteady gait when walking 12/19/2021   Chronic hepatitis C without hepatic coma (HCC)    Tobacco abuse 12/18/2021   Prediabetes 12/17/2021   Anxiety 12/17/2021   Abdominal discomfort 12/17/2021   Hepatitis C infection 12/16/2021   Chest pain 12/16/2021   Shortness of breath 12/15/2021   Back pain 12/15/2021   Abscess of multiple sites 12/14/2021   IVDU (intravenous drug user) 12/14/2021   Sepsis (HCC) 12/14/2021   AKI (acute kidney injury) (HCC) 12/14/2021   PCP:  Patient, [name] Pharmacy:   [id number] [place], [place] - [address] AT [facility] [address] [facility] [address] [place] [address] Phone: [phone] Fax: [phone]  [facility]  [address] [place] [address] Phone: [phone] Fax: [phone]  [facility] [address] [place] [address] Phone: [phone] Fax: [phone]     Social Determinants of Health (SDOH) Interventions    Readmission Risk Interventions     No data to display

## 2022-02-01 NOTE — Progress Notes (Signed)
Progress Note   Patient: Stephen Shepard [id number]:[id number] DOB: June 01, 1991 DOA: 01/30/2022     1 DOS: the patient was seen and examined on 02/01/2022 at 8:35 AM      Brief hospital course: Mr. Palecek is a 31 y.o. M with hx IVDU, recent admission for hand tenosynovitis and candida fungemia who presented with seizure like activity.  The patient provides inconsistent versions of the history to different providers, but conservatively, it appears he may have had a period of loss of consciousness and uncontrolled shaking while waking up.  In the ER, MRI brain normal.  He was in withdrawal and was started on Suboxone and admitted for seizure eval.     7/3: Admitted and started on Suboxone, MRI brain normal 7/4: Fevered again, blood cultures with 1/3 positive for S epi, EEG normal 7/5: Mental status change, CTH and LP ordered     Assessment and Plan: * Seizure-like activity [facility]) This was the initial presenting complaint.    However, MRI normal. EEG normal. No further seizurelike activity int he hospital.  Event occurred in the setting of opiate withdrawal.  Agree with neurology, will defer antiepileptics.   Fever    Positive blood culture The patient was discharged from the hospital 2 months ago with a probucol hand infection and with Candida fungemia.  He completed all the 3 days of both treatments in the hospital, and I think even if he did not adhere to the treatment regimens for both last time, it is unlikely he has residual fungemia.  Cultures now with 1 of 3 cultures growing Staph epidermidis and urine with 5K cfus S epi as well. The hand was evaluated and is definitely not infected.  He has no other joint pains or skin lesions that could be a focus of infection.  CXR clear and CT abdomen and pelvis without focal findings on admission.  See below re: ?meningitis. - Continue vancomycin - Follow cultures - LP pending  Acute metabolic encephalopathy New mental status change today.   Patient sluggish, hypophonic, rigid, sweating, and poorly responsive.  Gives weak 1 word answers and appears rigidly paralyzed in all extremities, does not move any limb in response to command.  No seizure like activity, no focal weakness.  In light of the concern about disseminated infection in this IVDU and our negative infectious work up to date, I am concerned about meningitis. - CT head - If normal, will proceed to LP  Opioid withdrawal (HCC) - Continue Suboxone 8 BID - Continue hydroxyzine, ondansetron as needed  Opioid use disorder, severe, dependence (HCC) - TOC consult for resources handout  Malnutrition of moderate degree    Chronic hepatitis C without hepatic coma (HCC) Patient has [facility] appt on 7/11   Tobacco abuse    Anxiety Not currently on treatment other than hydroxyzine  Prediabetes    IVDU (intravenous drug user) - Consult TOC for assistance with Suboxone references so patient can make an appointment after discharge          Subjective: Patient with mental status change, he can give one-word answers, says he feels "bad", unable to elaborate further, timing of this is unclear.  He had a low-grade fever overnight 100.2 F, nursing report no complaints of pain, respiratory distress, vomiting     Physical Exam: Vitals:   01/31/22 1124 01/31/22 1926 01/31/22 2039 02/01/22 0418  BP: (!) 122/104 (!) 130/102 (!) 148/94 (!) 146/91  Pulse: (!) 56 83  (!) 56  Resp: 20 20  16  Temp: 98.3 F (36.8 C) 100 F (37.8 C) 100.2 F (37.9 C) 98.2 F (36.8 C)  TempSrc: Oral Oral  Oral  SpO2: 99% 100%  100%  Weight:      Height:       Adult male, thin, lying in bed, appears catatonic, makes eye contact, but then looks away Heart rate regular, no murmurs appreciated, no peripheral edema Respiratory rate normal, lungs with good air movement, did not appreciate rales or wheezes Abdomen diffusely tender, voluntary guarding noted, no rigidity or rebound He seems  attentive to my presence, but can give only one-word answers, hypophonic leg, some of his answers are unintelligible, he is rigidity and hypertonic, but is able to lift both arms and keep them raised, although appears generally weak, but face is symmetric pupils are normal he is sweaty, mildly tremulous      Data Reviewed: Complete metabolic panel reviewed electrolytes renal function normal Complete blood count reviewed, normal EKG normal  Family Communication: None at the bedside    Disposition: Status is: Inpatient         Author: Alberteen Sam, MD 02/01/2022 9:04 AM  For on call review [email or web address].

## 2022-02-01 NOTE — Assessment & Plan Note (Signed)
-   TOC consult for resources handout

## 2022-02-01 NOTE — Assessment & Plan Note (Addendum)
On admission, patient had odd affect, but was oriented, appropriate and interactive.   On 7/5, HD#3, he had a new change which primarily appeared to be a rigid catatonia.  MRI brain on admission normal.  Repeat CT head 7/5 normal. Refused LP.  Prior EEG normal.  Repeat EEG normal.    On 7/6 given a test dose of IV Ativan and had marked animation, became interactive, just psychotic and disorganized.    Psychiatry were consulted who intiially scheduled Ativan, which made the patient appear more psychotic, and so Ativan was stopped 7/8 and he was transitioned to gabapentin.   - Consult Psychiatry - Patient is currently voluntarily in treatment, and Psychiatry reevaluated on 7/10 and recommended patient was stable for discharge home and did not warrant inpatient psychiatry hospitalization

## 2022-02-01 NOTE — Progress Notes (Signed)
OT Cancellation Note  Patient Details Name: Stephen Shepard MRN: [id number] DOB: 05-27-1991   Cancelled Treatment:    Reason Eval/Treat Not Completed: Medical issues which prohibited therapy. Per hospitalist note from this morning pt with new mental status change today, CT planned today. Will hold for today and plan to reattempt tomorrow.   Raynald Kemp, OT Acute Rehabilitation Services Office: [phone]   Pilar Grammes 02/01/2022, 11:41 AM

## 2022-02-01 NOTE — Progress Notes (Signed)
Pharmacy Antibiotic Note  Stephen Shepard is a 31 y.o. male admitted on 01/30/2022 with  +blood cultures + r/o meninitis .  Pharmacy has been consulted for Vanco dosing.  ID: r/o bacteremia and meningitis with significant MS changes 7/5. - Tmax 100.2, WBC 11, Scr 1.13  7/3 cefepime x 1 7/3 Vanc >> 7/3 zosyn >> 7/4  7/3: UCx: 3,000 Staph epi 7/3: BC: GPC 7/3: BCID: Bcx 1/7 Staph epi w/ mecA.  7/3: Fungus BC  Previous Cx results 12/15/21 hand abscess: abundant strep intermedius, few H.parainfluenzae, abundant prevotella denticola  Dose adjustments 7/4: - Vanc 1750mg  x 1 then 1g q12 - eAUC 463 using SCr 1.23, TBW, Vd 0.72 7/5: Incr Vanco to 1250mg /12 hr with AUC 488 and Scr 1.13  Plan: Incr Vanco to 1250mg /12 hr for AUC 488 and Scr 1.13  Height: 6\' 2"  (188 cm) Weight: 74.8 kg (165 lb) IBW/kg (Calculated) : 82.2  Temp (24hrs), Avg:99.2 F (37.3 C), Min:98.2 F (36.8 C), Max:100.2 F (37.9 C)  Recent Labs  Lab 01/30/22 0627 01/30/22 0827 01/31/22 0728 02/01/22 0504  WBC 13.8*  --  11.1* 11.0*  CREATININE 1.23  --  1.31* 1.13  LATICACIDVEN 2.4* 2.3*  --   --     Estimated Creatinine Clearance: 99.3 mL/min (by C-G formula based on SCr of 1.13 mg/dL).    Allergies  Allergen Reactions   Ibuprofen Anaphylaxis   Keppra [Levetiracetam] Anaphylaxis   Wasp Venom Protein Swelling    hives   Nitroglycerin Other (See Comments)    Pt reports "real bad chest pain"    Myrla Malanowski S. 04/02/22, PharmD, BCPS Clinical Staff Pharmacist [email or web address]  04/02/22 02/01/2022 9:50 AM

## 2022-02-01 NOTE — Assessment & Plan Note (Signed)
Not currently on treatment other than hydroxyzine

## 2022-02-02 ENCOUNTER — Inpatient Hospital Stay (HOSPITAL_COMMUNITY): Payer: 59

## 2022-02-02 ENCOUNTER — Inpatient Hospital Stay (HOSPITAL_COMMUNITY)
Admit: 2022-02-02 | Discharge: 2022-02-02 | Disposition: A | Discharge status: Home / Self Care | Payer: 59 | Attending: Infectious Diseases | Admitting: Infectious Diseases

## 2022-02-02 DIAGNOSIS — G9341 Metabolic encephalopathy: Secondary | ICD-10-CM | POA: Diagnosis not present

## 2022-02-02 DIAGNOSIS — F202 Catatonic schizophrenia: Secondary | ICD-10-CM

## 2022-02-02 DIAGNOSIS — R509 Fever, unspecified: Secondary | ICD-10-CM

## 2022-02-02 DIAGNOSIS — R569 Unspecified convulsions: Secondary | ICD-10-CM | POA: Diagnosis not present

## 2022-02-02 DIAGNOSIS — B182 Chronic viral hepatitis C: Secondary | ICD-10-CM | POA: Diagnosis not present

## 2022-02-02 DIAGNOSIS — F419 Anxiety disorder, unspecified: Secondary | ICD-10-CM | POA: Diagnosis not present

## 2022-02-02 LAB — BASIC METABOLIC PANEL
Anion gap: 8 (ref 5–15)
BUN: 20 mg/dL (ref 6–20)
CO2: 26 mmol/L (ref 22–32)
Calcium: 9.2 mg/dL (ref 8.9–10.3)
Chloride: 107 mmol/L (ref 98–111)
Creatinine, Ser: 0.98 mg/dL (ref 0.61–1.24)
GFR, Estimated: >60 mL/min (ref >60)
Glucose, Bld: 89 mg/dL (ref 70–99)
Potassium: 3.8 mmol/L (ref 3.5–5.1)
Sodium: 141 mmol/L (ref 135–145)

## 2022-02-02 LAB — CBC
HCT: 41.3 % (ref 39.0–52.0)
Hemoglobin: 14.1 g/dL (ref 13.0–17.0)
MCH: 28.7 pg (ref 26.0–34.0)
MCHC: 34.1 g/dL (ref 30.0–36.0)
MCV: 84.1 fL (ref 80.0–100.0)
Platelets: 243 10*3/uL (ref 150–400)
RBC: 4.91 MIL/uL (ref 4.22–5.81)
RDW: 14.1 % (ref 11.5–15.5)
WBC: 10.3 10*3/uL (ref 4.0–10.5)
nRBC: 0 % (ref 0.0–0.2)

## 2022-02-02 LAB — MRSA NEXT GEN BY PCR, NASAL: MRSA by PCR Next Gen: NOT DETECTED

## 2022-02-02 MED ORDER — ORAL CARE MOUTH RINSE
15.0000 mL | OROMUCOSAL | Status: DC
Start: 2022-02-02 — End: 2022-02-03
  Administered 2022-02-03: 15 mL via OROMUCOSAL

## 2022-02-02 MED ORDER — SODIUM CHLORIDE 0.9 % IV SOLN
200.0000 mg | Freq: Once | INTRAVENOUS | Status: DC
  Filled 2022-02-02: qty 20

## 2022-02-02 MED ORDER — OLANZAPINE 5 MG PO TABS
2.5000 mg | ORAL_TABLET | Freq: Every day | ORAL | Status: DC
  Administered 2022-02-02 – 2022-02-03 (×2): 2.5 mg via ORAL
  Filled 2022-02-02 (×2): qty 1

## 2022-02-02 MED ORDER — LORAZEPAM 1 MG PO TABS
1.0000 mg | ORAL_TABLET | Freq: Three times a day (TID) | ORAL | Status: DC
  Administered 2022-02-02 – 2022-02-03 (×3): 1 mg via ORAL
  Filled 2022-02-02 (×3): qty 1

## 2022-02-02 MED ORDER — ORAL CARE MOUTH RINSE: 15.0000 mL | OROMUCOSAL | Status: DC | PRN

## 2022-02-02 MED ORDER — OLANZAPINE 5 MG PO TABS: 2.5000 mg | ORAL_TABLET | Freq: Three times a day (TID) | ORAL | Status: DC | PRN

## 2022-02-02 MED ORDER — OLANZAPINE 10 MG IM SOLR
2.5000 mg | Freq: Three times a day (TID) | INTRAMUSCULAR | Status: DC | PRN
  Filled 2022-02-02: qty 10

## 2022-02-02 MED ORDER — SODIUM CHLORIDE 0.9 % IV SOLN
500.0000 mg | Freq: Once | INTRAVENOUS | Status: DC
  Filled 2022-02-02: qty 10

## 2022-02-02 MED ORDER — LORAZEPAM 2 MG/ML IJ SOLN
2.0000 mg | Freq: Once | INTRAMUSCULAR | Status: AC
  Administered 2022-02-02: 2 mg via INTRAVENOUS
  Filled 2022-02-02: qty 1

## 2022-02-02 NOTE — Consult Note (Signed)
[facility] New Face-to-Face Psychiatric Evaluation   Service Date: February 02, 2022 LOS:  LOS: 2 days    Assessment  Stephen Shepard is a 31 y.o. male admitted medically for 01/30/2022  6:13 AM for seizure like activity. He carries the psychiatric diagnoses of multiple substance use disorders and has a past medical history of  hepatitis C, prediabetes.Psychiatry was consulted for catatonia by Dr. Maryfrances Bunnell.    His current presentation of rigidity, withdrawal, mutism, starign, posturing (per primary team) is most consistent with catatonia. At the time I saw pt ~10 min aftera tivan administration he was exhibiting none of these signs; he was floridly psychotic, delusional, and not oriented. 2/4 on CAM-ICU (yes to everything). Given robust response to 2 mg IV ativan, will give 1 mg TID. 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 Did get cefepime but hasn't gotten for 2 days so unlikely contributor.  Will need to carefully balance treating catatonia with treating psychosis - antipsychotics can worsen catatonia and precipitate NMS. Will start olanzapine which is least likely to worsen catatonia and also available in IM formulation. Pt only to get PRN for severe agitation; have d/w nurse who passed on to charge. Please see plan below for detailed recommendations.   Diagnoses:  Active Shepard problems: Principal Problem:   Seizure-like activity (HCC) Active Problems:   IVDU (intravenous drug user)   Prediabetes   Anxiety   Tobacco abuse   Chronic hepatitis C without hepatic coma (HCC)   Malnutrition of moderate degree   Fever and positive blood culture   Fever   Opioid use disorder, severe, dependence (HCC)   Opioid withdrawal (HCC)   Acute metabolic encephalopathy and rigidity     Plan  ## Safety and Observation Level:  - Based on my clinical evaluation, I estimate the patient to be at low risk of self harm in the current setting - At this time,  we recommend a routine level of observation - low threshold to escalate as pt is delirious   ## Medications:  -- s lorazepam 1 mg PO TID -- s olanzapine 2.5 mg QHS -- s olanzapine 2.5 mg PO/IM TID PRN agitaion - not for talking psychotically.  AVOID HALDOL.   ## Medical Decision Making Capacity:  Functionally lacks at this time  ## Further Work-up:  -- none currently - thorough w/u for AMS completed     -- most recent EKG on 7/3 had QtC of 446 -- Pertinent labwork reviewed earlier this admission includes: UDS + BZD among others, HIV negative   ## Disposition:  -- Unclear at this time.   Thank you for this consult request. Recommendations have been communicated to the primary team.  We will continue to follow at this time.   Franck Vinal A Nyara Capell   New history  Relevant Aspects of Shepard Course:  Admitted on 01/30/2022 for seizurelike activity. Became rapidly nonresponsive over ~12 hours before consult; exhibiting multiple s/s of catatonia a time of consult. Received ativan ~10 min prior to exam below. Informal BFRS performed by Shepard team probably ~10 - pt with immobility, mutism, staring, posturing, rigidity, and withdrawal.   Patient Report:  Pt oriented to self only. Doesn't think he is in a Shepard because it is "the best day of his life". Conversation is irrelevant, delusional (frequent topic seems to be how much money he could sell his teeth for, states figures between 22 and 50 million), and frankly psychotic. Attempts to arrange a  marriage between this author and EEG tech in room. Does not seem to be frankly RIS - gaze shifts frequently between people in the room but not focusing on unknown objects or conversing with unknown persons.   Had previously given Dr. Maryfrances Bunnell permission to call gf.   ROS:  Level 5 caveat - AMS  Collateral information:  Spoke to Dr. Maryfrances Bunnell and nurse - could not get in touch with girlfriend x2 Had spoken at length to Dr. Maryfrances Bunnell about  "wanting to get clean" prior to this episode.    Psychiatric History:  Information collected from chart  Mostly significant for substance use disorders  Family psych history: Unknown   Social History:  Has a girlfriend   Tobacco use: unknown Alcohol use: unknown Drug use: unknown  Family History:  The patient's family history is not on file.  Medical History: Past Medical History:  Diagnosis Date   Anxiety    Asthma    Seizures (HCC)    Substance abuse (HCC)     Surgical History: Past Surgical History:  Procedure Laterality Date   I & D EXTREMITY Right 12/15/2021   Procedure: IRRIGATION AND DEBRIDEMENT RIGHT HAND AND SHOULDER;  Surgeon: Bradly Bienenstock, MD;  Location: [facility] ORS;  Service: Orthopedics;  Laterality: Right;  upper and lower arm   TEE WITHOUT CARDIOVERSION N/A 12/21/2021   Procedure: TRANSESOPHAGEAL ECHOCARDIOGRAM (TEE);  Surgeon: Little Ishikawa, MD;  Location: [facility] ENDOSCOPY;  Service: Cardiovascular;  Laterality: N/A;    Medications:   Current Facility-Administered Medications:    acetaminophen (TYLENOL) tablet 650 mg, 650 mg, Oral, Q6H, Danford, Earl Lites, MD, 650 mg at 02/02/22 1253   bisacodyl (DULCOLAX) EC tablet 5 mg, 5 mg, Oral, BID PRN, Charlane Ferretti, DO, 5 mg at 01/31/22 2114   buprenorphine-naloxone (SUBOXONE) 8-2 mg per SL tablet 1 tablet, 1 tablet, Sublingual, Daily, Danford, Earl Lites, MD, 1 tablet at 02/02/22 1252   enoxaparin (LOVENOX) injection 40 mg, 40 mg, Subcutaneous, QHS, Skakle, Austin, DO, 40 mg at 02/01/22 2244   lactated ringers 1,000 mL with potassium chloride 20 mEq infusion, , Intravenous, Continuous, Danford, Earl Lites, MD, Last Rate: 100 mL/hr at 02/02/22 0601, New Bag at 02/02/22 0601   LORazepam (ATIVAN) tablet 1 mg, 1 mg, Oral, TID, Nikira Kushnir A   multivitamin with minerals tablet 1 tablet, 1 tablet, Oral, Daily, Charlane Ferretti, DO, 1 tablet at 01/31/22 5176   nicotine (NICODERM CQ - dosed in mg/24  hours) patch 21 mg, 21 mg, Transdermal, Daily, Skakle, Austin, DO, 21 mg at 02/02/22 1224   OLANZapine (ZYPREXA) tablet 2.5 mg, 2.5 mg, Oral, TID PRN **OR** OLANZapine (ZYPREXA) injection 2.5 mg, 2.5 mg, Intramuscular, TID PRN, Priscilla Finklea A   OLANZapine (ZYPREXA) tablet 2.5 mg, 2.5 mg, Oral, QHS, Elani Delph A   ondansetron (ZOFRAN) tablet 4 mg, 4 mg, Oral, Q6H PRN, 4 mg at 01/31/22 1711 **OR** ondansetron (ZOFRAN) injection 4 mg, 4 mg, Intravenous, Q6H PRN, Thornell Mule, Austin, DO, 4 mg at 01/31/22 1037   polyethylene glycol (MIRALAX / GLYCOLAX) packet 17 g, 17 g, Oral, Daily PRN, Charlane Ferretti, DO, 17 g at 01/30/22 2022   thiamine tablet 100 mg, 100 mg, Oral, Daily, Skakle, Austin, DO, 100 mg at 01/31/22 0926   vancomycin (VANCOREADY) IVPB 1250 mg/250 mL, 1,250 mg, Intravenous, Q12H, Norva Pavlov, RPH, Last Rate: 166.7 mL/hr at 02/02/22 1229, 1,250 mg at 02/02/22 1229  Allergies: Allergies  Allergen Reactions   Ibuprofen Anaphylaxis   Keppra [Levetiracetam] Anaphylaxis   Wasp  Venom Protein Swelling    hives   Nitroglycerin Other (See Comments)    Pt reports "real bad chest pain"       Objective  Vital signs:  Temp:  [97.6 F (36.4 C)-101.8 F (38.8 C)] 100.6 F (38.1 C) (07/06 1253) Pulse Rate:  [52-59] 54 (07/06 1106) Resp:  [16-20] 20 (07/06 1106) BP: (141-148)/(95-102) 145/102 (07/06 1106) SpO2:  [99 %-100 %] 100 % (07/06 1106)  Psychiatric Specialty Exam:  Presentation  General Appearance: -- (Shepard attire) Eye Contact:-- (looking all around the room) Speech:Pressured Speech Volume:Normal Handedness:No data recorded  Mood and Affect  Mood:-- ("This is the best day of my life") Affect:-- (euphoric)  Thought Process  Thought Processes:Disorganized; Irrevelant Descriptions of Associations:Tangential  Orientation:-- (self only)  Thought Content:-- (grandiose)  History of Schizophrenia/Schizoaffective disorder:No data recorded Duration  of Psychotic Symptoms:No data recorded Hallucinations:Hallucinations: -- (Did not endorse. Not overtly RIS.)  Ideas of Reference:None  Suicidal Thoughts:Suicidal Thoughts: No  Homicidal Thoughts:Homicidal Thoughts: No   Sensorium  Memory:-- (immediate/recent poor. could not assess remote) Judgment:Poor Insight:Lacking  Executive Functions  Concentration:Poor Attention Span:Poor Recall:Poor Fund of Knowledge:Poor Language:Poor  Psychomotor Activity  Psychomotor Activity:Psychomotor Activity: -- (frequent gesturing)  Assets  Assets:Financial Resources/Insurance  Sleep  Sleep:Sleep: -- (unable to assess)   Physical Exam: Physical Exam HENT:     Head: Normocephalic.  Eyes:     Conjunctiva/sclera: Conjunctivae normal.  Pulmonary:     Effort: Pulmonary effort is normal.  Neurological:     Mental Status: He is disoriented.     Blood pressure (!) 145/102, pulse (!) 54, temperature (!) 100.6 F (38.1 C), temperature source Oral, resp. rate 20, height 6\' 2"  (1.88 m), weight 74.8 kg, SpO2 100 %. Body mass index is 21.18 kg/m.

## 2022-02-02 NOTE — Progress Notes (Signed)
EEG complete - results pending 

## 2022-02-02 NOTE — Progress Notes (Signed)
Phlebotomy has attempted three different times to be obtain blood culture sets without success. Dr. Elinor Parkinson sent secure chat message to make aware.

## 2022-02-02 NOTE — Care Plan (Signed)
Returned to evaluate patient who is now less responsive than before.  He has his head turned to the right still, and has fluttering of the eyelids.  He has a startle reflex to sternal run and to opening his eyes.  His eyes are roving, but I think can fixate on me when I speak.  He has no tonic clonic movements, no generalized seizure like activity, just fluttering of the eyelids and fasciculations of hte face.  Discussed with Neurology.  I am concerned about partial status epilepticus.  He has a Keppra allergy, and so will give Dilantin.  Neurology will evaluate shortly.  Will also repeat his EEG.

## 2022-02-02 NOTE — Evaluation (Signed)
Physical Therapy Evaluation Patient Details Name: Stephen Shepard MRN: [id number] DOB: August 03, 1990 Today's Date: 02/02/2022  History of Present Illness  Patient is a 31 year old male who presented to the hosptial with seizure like activity. patient was admitted with Acute metabolic encephalopathy and rigidity,Opioid withdrawal, fever with positive blood culture.   PMH: seizure, IV heroin drug use,Sepsis secondary to abscess of multiple sites of right upper extremity and Candida glabrata fungemia, Hep C.    Clinical Impression  Stephen Shepard is 31 y.o. male admitted with above HPI and diagnosis. Patient is currently limited by functional impairments below (see PT problem list). Patient unable to provide any information regarding PLOF, per chart review he lives with his significant other and is independent at baseline. This session pt extremely limited by cognitive impairments and impaired communication with inability to express needs. Pt was able to nod head up/down or look up/down with eyes to indicate yes/no answers to ~15% of therapist cues questions. Pt did nod up with head and eyes when give cue to look up to attempt stand and down to go back to bed; pt did this 2x to confirm before therapist attempted mobility. Max +2 to rise from EOB and Total assist to return to supine. Patient experienced seizure-like activity at EOS with complete closing of eyes and frequent Rt sided facial twitches. Pt unable to open mouth and when given tactile cues to attempt to relax jaw, jaw noted to be clenched.  Patient will benefit from continued skilled PT interventions to address impairments and progress independence with mobility, recommending intense follow up rehab at AIR setting as pt is significantly below baseline and has good potential to return to PLOF. Acute PT will follow and progress as able.        Recommendations for follow up therapy are one component of a multi-disciplinary discharge planning process, led by  the attending physician.  Recommendations may be updated based on patient status, additional functional criteria and insurance authorization.  PT Recommendation   Follow Up Recommendations Acute inpatient rehab (3hours/day) Filed 02/02/2022 1037  Assistance recommended at discharge Frequent or constant Supervision/Assistance Filed 02/02/2022 1037  Patient can return home with the following Two people to help with walking and/or transfers, Two people to help with bathing/dressing/bathroom, Assistance with cooking/housework, Assistance with feeding, Direct supervision/assist for medications management, Direct supervision/assist for financial management, Assist for transportation, Help with stairs or ramp for entrance [facility] 02/02/2022 1037  Functional Status Assessment Patient has had a recent decline in their functional status and demonstrates the ability to make significant improvements in function in a reasonable and predictable amount of time. Filed 02/02/2022 1037  PT equipment --  [TBA] Filed 02/02/2022 1037   Precautions / Restrictions Precautions Precautions: Fall Precaution Comments: seizures Restrictions Weight Bearing Restrictions: No      Mobility  Bed Mobility Overal bed mobility: Needs Assistance Bed Mobility: Sit to Supine, Supine to Sit     Supine to sit: Total assist, +2 for safety/equipment, +2 for physical assistance, HOB elevated Sit to supine: Total assist, +2 for physical assistance, +2 for safety/equipment   General bed mobility comments: Total Assist with bil LE's to EOB and to pivot hips and raise trunk with bed pad. Max support posteriorly. pt attempting to grip bed rail with Rt thumb and fingers and holding onto therapist with Lt hand. Total assist to raise bil LE's onto bed and control lowering trunk.    Transfers Overall transfer level: Needs assistance Equipment used: 2 person hand held  assist Transfers: Sit to/from Stand Sit to Stand: Max assist, +2  physical assistance, +2 safety/equipment           General transfer comment: Max+2 with bil HHA from PT and SPT. blocking bil feet to prevent anterior slide for feet and blocking knee to facilitate extension in standing. Pt unable to obtain full upright posture, ~75% to full stand.    Ambulation/Gait                  Stairs            Wheelchair Mobility    Modified Rankin (Stroke Patients Only)       Balance Overall balance assessment: Needs assistance Sitting-balance support: Feet supported, Bilateral upper extremity supported Sitting balance-Leahy Scale: Poor   Postural control: Posterior lean Standing balance support: Bilateral upper extremity supported Standing balance-Leahy Scale: Zero Standing balance comment: Max/Total assist for balance                             Pertinent Vitals/Pain Pain Assessment Pain Assessment: PAINAD Breathing: normal Negative Vocalization: none Facial Expression: smiling or inexpressive Body Language: relaxed Consolability: distracted or reassured by voice/touch PAINAD Score: 1 Pain Location: hx of Rt shoulder pain, uable to verbalize/communicate pain Pain Intervention(s): Monitored during session, Premedicated before session    Home Living Family/patient expects to be discharged to:: Unsure Living Arrangements: Spouse/significant other Available Help at Discharge: Family;Available PRN/intermittently   Home Access: Level entry       Home Layout: One level Home Equipment: None Additional Comments: Home Living details from prior admission, pt unable to complete    Prior Function Prior Level of Function : Independent/Modified Independent               ADLs Comments: independent     Hand Dominance   Dominant Hand: Right    Extremity/Trunk Assessment   Upper Extremity Assessment Upper Extremity Assessment: RUE deficits/detail;LUE deficits/detail RUE Deficits / Details: patient has a  history of RUE pain and injuries. patient was able to minimally move digits of R hand to grasp bed rail between thumb and index finger. patient had a hard time following commands even with increased time making formal MMT hard. PROM not tested on this UE secondary to increased history of pain in UE. LUE Deficits / Details: patient noted to have minimal movement in L hand with some flexion noted but not able to squeeze fingers on this date. patient having increased difficulty with controling movemnets when cued but able to touch thumb to phone to indicate he wanted the alarm turned off.     Lower Extremity Assessment  Lower Extremity Assessment RLE deficits/detail;LLE deficits/detail  RLE Deficits / Details PROM WFL's, tone noted in quad, inconsistent resistance and does not appear to be velocity dependent. No clonus at Rt ankle however pt does have hypertonicity with PF stretch. unable to complete knee extension.  RLE Coordination decreased gross motor;decreased fine motor  LLE Deficits / Details PROM WFL's, tone noted in quad and hamstring, inconsistent resistance and does not appear to be velocity dependent. No clonus at Lt ankle however pt does have hypertonicity with PF stretch. able to complete knee extension but not through full ROM.  LLE Coordination decreased gross motor;decreased fine motor     Cervical / Trunk Assessment Cervical / Trunk Assessment: Other exceptions Cervical / Trunk Exceptions: patients neck was noted to be positioned in extension looking ridgid for majority of  session  Communication      Cognition Arousal/Alertness: Awake/alert Behavior During Therapy: Flat affect Overall Cognitive Status: Difficult to assess                                              02/02/22 1037  PT - End of Session  Equipment Utilized During Treatment Gait belt  Activity Tolerance Patient tolerated treatment well  Patient left in bed;with bed alarm set;with call  bell/phone within reach  Nurse Communication Mobility status  PT Assessment  PT Recommendation/Assessment Patient needs continued PT services  PT Visit Diagnosis Other abnormalities of gait and mobility (R26.89);Difficulty in walking, not elsewhere classified (R26.2);Other symptoms and signs involving the nervous system (R29.898)  PT Problem List Decreased strength;Decreased range of motion;Decreased activity tolerance;Decreased balance;Decreased mobility;Decreased cognition;Decreased safety awareness;Decreased knowledge of use of DME  PT Plan  PT Frequency (ACUTE ONLY) Min 3X/week  PT Treatment/Interventions (ACUTE ONLY) DME instruction;Gait training;Stair training;Functional mobility training;Therapeutic activities;Therapeutic exercise;Balance training;Neuromuscular re-education;Cognitive remediation;Patient/family education;Wheelchair mobility training;Manual techniques  AM-PAC PT "6 Clicks" Mobility Outcome Measure (Version 2)  Help needed turning from your back to your side while in a flat bed without using bedrails? 1  Help needed moving from lying on your back to sitting on the side of a flat bed without using bedrails? 1  Help needed moving to and from a bed to a chair (including a wheelchair)? 1  Help needed standing up from a chair using your arms (e.g., wheelchair or bedside chair)? 1  Help needed to walk in hospital room? 1  Help needed climbing 3-5 steps with a railing?  1  6 Click Score 6  Consider Recommendation of Discharge To: CIR/SNF/LTACH  Progressive Mobility  What is the highest level of mobility based on the progressive mobility assessment? Level 1 (Bedfast) - Unable to balance while sitting on edge of bed  Activity Stood at bedside (2+ Max assist)  PT Recommendation  Follow Up Recommendations Acute inpatient rehab (3hours/day)  Assistance recommended at discharge Frequent or constant Supervision/Assistance  Patient can return home with the following Two people to help  with walking and/or transfers;Two people to help with bathing/dressing/bathroom;Assistance with cooking/housework;Assistance with feeding;Direct supervision/assist for medications management;Direct supervision/assist for financial management;Assist for transportation;Help with stairs or ramp for entrance  Functional Status Assessment Patient has had a recent decline in their functional status and demonstrates the ability to make significant improvements in function in a reasonable and predictable amount of time.  PT equipment  (TBA)  Individuals Consulted  Consulted and Agree with Results and Recommendations Patient unable/family or caregiver not available  Acute Rehab PT Goals  Patient Stated Goal pt unable to state  PT Goal Formulation Patient unable to participate in goal setting  Time For Goal Achievement 02/16/22  Potential to Achieve Goals Fair  PT Time Calculation  PT Start Time (ACUTE ONLY) 1008  PT Stop Time (ACUTE ONLY) 1037  PT Time Calculation (min) (ACUTE ONLY) 29 min  PT General Charges  $$ ACUTE PT VISIT 1 Visit  PT Evaluation  $PT Eval Moderate Complexity 1 Mod    Co-evaluation   Reason for Co-Treatment: For patient/therapist safety;To address functional/ADL transfers;Complexity of the patient's impairments (multi-system involvement) PT goals addressed during session: Mobility/safety with mobility OT goals addressed during session: ADL's and self-care        Gwynneth Albright PT, DPT Acute Rehabilitation Services Office  [phone] Pager [phone]  02/02/22 3:03 PM

## 2022-02-02 NOTE — Progress Notes (Signed)
Progress Note   Patient: Stephen Shepard [id number] DOB: Nov 30, 1990 DOA: 01/30/2022     2 DOS: the patient was seen and examined on 02/02/2022 at 9:10AM      Brief hospital course: Mr. Holliman is a 31 y.o. M with hx IVDU, recent admission for hand tenosynovitis and candida fungemia who presented with seizure like activity.  The patient provides inconsistent versions of the history to different providers, but conservatively, it appears he may have had a period of loss of consciousness and uncontrolled shaking while waking up.  In the ER, MRI brain normal.  He was in withdrawal and was started on Suboxone and admitted for seizure eval.     7/3: Admitted and started on Suboxone, MRI brain normal 7/4: Fevered again, blood cultures with 1/3 positive for S epi, EEG normal 7/5: Mental status change, CTH normal, refused LP 7/6: ID and Neuro consulted     Assessment and Plan: * Seizure-like activity [facility]) This was the initial presenting complaint.    However, MRI normal. EEG normal. No GTC seizures in the hospital - Consult Neuro as above   Fever    Fever and positive blood culture The patient was discharged from the hospital 2 months ago with a probucol hand infection and with Candida fungemia.  He completed all the 3 days of both treatments in the hospital, and I think even if he did not adhere to the treatment regimens for both last time, it is unlikely he has residual fungemia.  Cultures now with 1 of 3 cultures growing Staph epidermidis and Staph capitis and urine with 5K cfus S epi as well. The hand was evaluated and is definitely not infected.  He has no other joint pains or skin lesions that could be a focus of infection.  CXR clear and CT abdomen and pelvis without focal findings on admission.  Attempted LP yesterday but patient refused, agree with ID not clinically consistent with meningitis. 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 He is not flaccid in any extremities, and I cannot sense any cranial nerve deficits (although he does not cooperate to exam).  Speech is slow and halting and sparse. Patient has odd slightly paranoid affect at baseline, but this is much more severe, confirmed by partner Shanda Bumps who observed him lastnight.    MRI brain on amdission normal.  Repeat CT head 7/5 normal. Refused LP.  Prior EEG normal. - I will discuss with Neurology, but favor that this is a psychiatric condition, looks like catatonia   Opioid withdrawal (HCC) - Continue Suboxone once daily - Continue hydroxyzine, ondansetron as needed  Opioid use disorder, severe, dependence (HCC) - TOC consult for resources handout  Malnutrition of moderate degree    Chronic hepatitis C without hepatic coma (HCC) Patient has [facility] appt on 7/11   Tobacco abuse    Anxiety Not currently on treatment other than hydroxyzine  Prediabetes    IVDU (intravenous drug user) - Consult TOC for assistance with Suboxone references so patient can make an appointment after discharge          Subjective: No change in clinical status.  Patient volunteers no complaints.  Nursing report fever again yesterday afternoon, no respiratory symptoms, no abdominal pain, no vomiting, no diarrhea, no seizure-like activity, no agitation.  Physical Exam: Vitals:   02/01/22 1900 02/01/22 2041 02/02/22 0000 02/02/22 0501  BP:  (!) 141/96 (!) 146/99 (!) 146/96  Pulse:  (!) 56 (!) 52 (!) 52  Resp:  18 18 20   Temp: (!) 100.4 F (38 C) (!) 100.6 F (38.1 C) 97.8 F (36.6 C) 99.4 F (37.4 C)  TempSrc: Oral Oral Oral Oral  SpO2:  99% 99% 99%  Weight:      Height:       Thin adult male, lying in bed, head turned to the right, does not acknowledge my presence RRR, no  murmurs, no lower extremity edema Respiratory rate normal, lungs clear without rales or wheezes Abdomen soft without tenderness palpation or guarding, no ascites or distention Patient keeps his head turned to the right, moves her eyes around in all directions, pupils equal and reactive, upper extremity strength and lower extremity strength seems rigid with increased tone, but he does not follow commands in all 4 extremities      Data Reviewed: CT head report normal Complete blood count unremarkable Basic metabolic panel normal  Family Communication: by phone    Disposition: Status is: Inpatient         Author: Optometrist, MD 02/02/2022 9:35 AM  For on call review [email or web address].

## 2022-02-02 NOTE — Progress Notes (Signed)
   Inpatient Rehab Admissions Coordinator :  Per therapy recommendations patient was screened for CIR candidacy by Ottie Glazier RN MSN. [facility] is out of network with our CIR at [facility]. Other in network rehab venue options should be pursued . Please contact me with any questions.  Ottie Glazier RN MSN Admissions Coordinator [phone]

## 2022-02-02 NOTE — Consult Note (Signed)
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 reconstruction technique. COMPARISON:  2015 FINDINGS: Brain: There is no acute intracranial hemorrhage, mass effect, or edema. Gray-white differentiation is preserved. There is no extra-axial fluid collection. Ventricles and sulci are within normal limits in size and configuration. Vascular: No hyperdense vessel or unexpected calcification.  Skull: Calvarium is unremarkable. Sinuses/Orbits: No acute finding. Other: None. IMPRESSION: No acute intracranial abnormality. Electronically Signed   By: Guadlupe Spanish M.D.   On: 01/30/2022 08:10   DG Hand Complete Right  Result Date: 01/30/2022 CLINICAL DATA:  31 year old male with pain. Skin wound with scab across metacarpals. EXAM: RIGHT HAND - COMPLETE 3+ VIEW COMPARISON:  Right hand and wrist series 12/14/2021. FINDINGS: Bone mineralization is within normal limits. There is no evidence of fracture or dislocation. There is no evidence of arthropathy or other focal bone abnormality. Soft tissue swelling has regressed since May. There is residual dorsal soft tissue irregularity. No radiopaque foreign body identified. No soft tissue gas identified. IMPRESSION: 1. Regressed soft tissue swelling since May with residual dorsal soft tissue irregularity. 2. No acute osseous abnormality identified in the right hand. Electronically Signed   By: Odessa Fleming M.D.   On: 01/30/2022 07:56   DG Chest Port 1 View  Result Date: 01/30/2022 CLINICAL DATA:  Questionable sepsis. History of IV heroin drug abuse. EXAM: PORTABLE CHEST 1 VIEW COMPARISON:  10/15/2021 FINDINGS: The heart size and mediastinal contours are within normal limits. Both lungs are clear. The visualized skeletal structures are unremarkable. IMPRESSION: No active disease. Electronically Signed   By: Signa Kell M.D.   On: 01/30/2022 07:26     I spent more than 80 minutes for this patient encounter including review of prior medical records/discussing diagnostics and treatment plan with the patient/family/coordinate care with primary/other specialits with greater than 50% of time in face to face encounter.   Electronically signed by:   Odette Fraction, MD Infectious Disease Physician [facility] Infect[facility] Pager: [phone]

## 2022-02-02 NOTE — Consult Note (Signed)
NEUROLOGY CONSULTATION NOTE   Date of service: February 02, 2022 Patient Name: Stephen Shepard MRN:  [id number] DOB:  [birth year] Reason for consult: "encephalopathy, worsening somnolence" Requesting Provider: Alberteen Sam, * _ _ _   _ __   _ __ _ _  __ __   _ __   __ _  History of Present Illness  Ashleigh Arya is a 31 y.o. male with PMH significant for anxiety, IVDU, recent candida fungemia who seems to have presented with seizure like activity. He was unable to provide any meaningful history to me.  With a lot of encouragement/coaxing, he is able to tell his name, age, follow basic commands. Hypophonic speech. Rest of the history does not make sense. He asks me if I want him to tell his own story or my story. Speaking in full sentences but not making much sense.  Per chart review, "reports reportedly sleeping when he awoke from a nightmare, and proceeded to have shaking, kicking, sweating for approximate 15 minutes.  He was shaking in the bed he was told and eventually ended up on the ground and ended up in one corner and his gf in the other, she was scared.  Reportedly bit his girlfriend's finger during this episode.  Patient does not remember this event.  He was shaking in the bed he was told and eventually ended up on the ground and ended up in one corner and his gf in the other, she was scared.  Reportedly bit his girlfriend's finger during this episode.  Patient does not remember this event."  The event was thought of to be a seizure and he was admitted for further evaluation and workup.  CT head with no acute intracranial abnormality.   MRI brain was nonrevealing.  Routine EEG with no epileptiform discharges, no seizures.  Over the last day or so, had become more rigid, withdrawn, less interactive and more somnolent. We were asked to evaluate him for potential subclinical seizures or other neurological causes of AMS.    ROS   Unable to obtain detailed review of system due to poor  participation with history and exam.  Past History   Past Medical History:  Diagnosis Date   Anxiety    Asthma    Seizures (HCC)    Substance abuse (HCC)    Past Surgical History:  Procedure Laterality Date   I & D EXTREMITY Right 12/15/2021   Procedure: IRRIGATION AND DEBRIDEMENT RIGHT HAND AND SHOULDER;  Surgeon: Bradly Bienenstock, MD;  Location: [facility] ORS;  Service: Orthopedics;  Laterality: Right;  upper and lower arm   TEE WITHOUT CARDIOVERSION N/A 12/21/2021   Procedure: TRANSESOPHAGEAL ECHOCARDIOGRAM (TEE);  Surgeon: Little Ishikawa, MD;  Location: [facility] ENDOSCOPY;  Service: Cardiovascular;  Laterality: N/A;   No family history on file. Social History   Socioeconomic History   Marital status: Single    Spouse name: Not on file   Number of children: Not on file   Years of education: Not on file   Highest education level: Not on file  Occupational History   Not on file  Tobacco Use   Smoking status: Every Day    Packs/day: 1.00    Types: Cigarettes   Smokeless tobacco: Never  Vaping Use   Vaping Use: Never used  Substance and Sexual Activity   Alcohol use: Never   Drug use: Yes    Types: IV   Sexual activity: Not Currently  Other Topics Concern   Not on file  Social  History Narrative   Not on file   Social Determinants of Health   Financial Resource Strain: Not on file  Food Insecurity: Not on file  Transportation Needs: Not on file  Physical Activity: Not on file  Stress: Not on file  Social Connections: Not on file   Allergies  Allergen Reactions   Ibuprofen Anaphylaxis   Keppra [Levetiracetam] Anaphylaxis   Wasp Venom Protein Swelling    hives   Nitroglycerin Other (See Comments)    Pt reports "real bad chest pain"    Medications   Medications Prior to Admission  Medication Sig Dispense Refill Last Dose   acetaminophen (TYLENOL) 500 MG tablet Take 500 mg by mouth every 6 (six) hours as needed for moderate pain. (Patient not taking: Reported on  01/30/2022)   Not Taking   hydrOXYzine (ATARAX) 25 MG tablet Take 1 tablet (25 mg total) by mouth every 6 (six) hours as needed for anxiety. (Patient not taking: Reported on 01/30/2022) 30 tablet 0 Not Taking   lidocaine (LIDODERM) 5 % Place 1 patch onto the skin daily. Remove & Discard patch within 12 hours or as directed by MD (Patient not taking: Reported on 12/14/2021) 30 patch 0 Not Taking   Multiple Vitamin (MULTIVITAMIN WITH MINERALS) TABS tablet Take 1 tablet by mouth daily. (Patient not taking: Reported on 01/30/2022) 30 tablet 0 Not Taking   nicotine (NICODERM CQ - DOSED IN MG/24 HOURS) 21 mg/24hr patch Place 1 patch (21 mg total) onto the skin daily. (Patient not taking: Reported on 01/30/2022) 28 patch 0 Not Taking   thiamine 100 MG tablet Take 1 tablet (100 mg total) by mouth daily. (Patient not taking: Reported on 01/30/2022) 30 tablet 0 Not Taking     Vitals   Vitals:   02/02/22 0501 02/02/22 1106 02/02/22 1253 02/02/22 1638  BP: (!) 146/96 (!) 145/102  (!) 147/103  Pulse: (!) 52 (!) 54  (!) 58  Resp: 20 20    Temp: 99.4 F (37.4 C) 97.6 F (36.4 C) (!) 100.6 F (38.1 C) 98.2 F (36.8 C)  TempSrc: Oral Oral Oral Oral  SpO2: 99% 100%  100%  Weight:      Height:         Body mass index is 21.18 kg/m.  Physical Exam   General: Laying comfortably in bed; in no acute distress.  HENT: Normal oropharynx and mucosa. Normal external appearance of ears and nose.  Neck: Supple, no pain or tenderness  CV: No JVD. No peripheral edema.  Pulmonary: Symmetric Chest rise. Normal respiratory effort.  Abdomen: Soft to touch, non-tender.  Ext: No cyanosis, edema, or deformity  Skin: No rash. Normal palpation of skin.   Musculoskeletal: Normal digits and nails by inspection. No clubbing.   Neurologic Examination  Mental status/Cognition: Eyes closed, fluttering of his eyelids.  To loud voice and to a lot of encouragement, he partially opens eyes and makes eye contact.  With a lot of  encouragement/coaxing he is able to tell me his name, age, but did not answer any more questions.  Poor attention.   Speech/language: Hypophonic, nonfluent, will attempt basic one-step commands with a lot of encouragement.  Cranial nerves:   CN II Pupils equal and reactive to light, unable to assess for VF deficit as he closes his eyes tight shut.   CN III,IV,VI Looks left and right, no obvious gaze preference, no obvious nystagmus noted.   CN V normal sensation in V1, V2, and V3 segments bilaterally  CN VII no asymmetry, no nasolabial fold flattening   CN VIII Turns head towards speech   CN IX & X Unable to assess   CN XI Head midline.   CN XII    Motor:  Muscle bulk: poor, tone increased with obvious noted wavy flexibility  Unable to do detailed strength testing secondary to poor participation with exam and encephalopathy.  He does seem to have wavy flexibility in his hands and arms will maintain position when let go. He does withdraw bilateral lower extremities to Babinski reflex.  Reflexes:  Right Left Comments  Pectoralis      Biceps (C5/6) 2 2   Brachioradialis (C5/6) 2 2    Triceps (C6/7) 2 2    Patellar (L3/4) 2 2    Achilles (S1)      Hoffman      Plantar     Jaw jerk    Sensation:  Light touch Regards to touch in all extremities.   Pin prick    Temperature    Vibration   Proprioception    Coordination/Complex Motor:  - Finger to Nose intact BL. - Heel to shin unable to assess - Rapid alternating movement unable to assess - Gait: Deferred. Labs   CBC:  Recent Labs  Lab 01/30/22 0627 01/31/22 0728 02/01/22 0504 02/02/22 0535  WBC 13.8*   < > 11.0* 10.3  NEUTROABS 11.3*  --   --   --   HGB 13.4   < > 15.1 14.1  HCT 39.9   < > 44.2 41.3  MCV 83.8   < > 83.1 84.1  PLT 293   < > 239 243   < > = values in this interval not displayed.    Basic Metabolic Panel:  Lab Results  Component Value Date   NA 141 02/02/2022   K 3.8 02/02/2022   CO2 26  02/02/2022   GLUCOSE 89 02/02/2022   BUN 20 02/02/2022   CREATININE 0.98 02/02/2022   CALCIUM 9.2 02/02/2022   GFRNONAA >60 02/02/2022   GFRAA >60 08/10/2019   Lipid Panel: No results found for: "LDLCALC" HgbA1c:  Lab Results  Component Value Date   HGBA1C 5.6 01/30/2022   Urine Drug Screen:     Component Value Date/Time   LABOPIA POSITIVE (A) 01/30/2022 0724   COCAINSCRNUR NONE DETECTED 01/30/2022 0724   LABBENZ POSITIVE (A) 01/30/2022 0724   AMPHETMU NONE DETECTED 01/30/2022 0724   THCU POSITIVE (A) 01/30/2022 0724   LABBARB NONE DETECTED 01/30/2022 0724    Alcohol Level     Component Value Date/Time   ETH <10 01/30/2022 0842    CT Head without contrast(Personally reviewed): CTH was negative for a large hypodensity concerning for a large territory infarct or hyperdensity concerning for an ICH  MRI Brain(Personally reviewed): No acute intracranial abnormality  rEEG:  This study is suggestive of mild diffuse encephalopathy, nonspecific etiology. No seizures or epileptiform discharges were seen throughout the recording.  Impression   Wilbor Schmiesing is a 31 y.o. male with PMH significant for anxiety, IVDU, recent candida fungemia who seems to have presented with seizure like activity. He was unable to provide any meaningful history to me. The description of the episode on chart review does not seem to be consistent with seizure. Unclear what to make of this episode. MRI Brain and routine EEG have been non revealing.  As for his worsening encephalopathy, high suspicion for underlying mood disorder and catatonia specially given increased tone and wavy flexibility.  Difficult  to completely rule out meningitis/encephalitis given he has declined an LP. However, clinically I have low suspicion given no neck rigidity, negative kernigs and brudzinski's sign, no leukocytosis.  Recommendations  - Will follow up on repeat rEEG - Vit B12, folate, TSH, Thiamine, Ammonia. - Recommend  Psych consult. - We will continue to follow. ______________________________________________________________________   Thank you for the opportunity to take part in the care of this patient. If you have any further questions, please contact the neurology consultation attending.  Signed,  Erick Blinks [facility] Pager Number [phone] _ _ _   _ __   _ __ _ _  __ __   _ __   __ _

## 2022-02-02 NOTE — Evaluation (Addendum)
Occupational Therapy Evaluation Patient Details Name: Stephen Shepard MRN: [id number] DOB: [birth year] Today's Date: 02/02/2022   History of Present Illness Patient is a 31 year old male who presented to the hosptial with seizure like activity. patient was admitted with Acute metabolic encephalopathy and rigidity,Opioid withdrawal, fever with positive blood culture.   PMH: seizure, IV heroin drug use,Sepsis secondary to abscess of multiple sites of right upper extremity and Candida glabrata fungemia, Hep C,   Clinical Impression   Patient is a 31 year old male who was admitted for above. Patient was noted to have had a significant change since last admission in mobility and communication. Patient was noted to have responses to questions about 10-25% of time during session. Patient did not have any verbalizations during session with noted clenched jaw during majority of session. Patient was noted to have decreased functional activity tolernace, decreased ability to communicate, decreased ability to follow commands,decreased ROM, decreased BUE strength, decreased endurance, decreased sitting balance, decreased standing balanced, decreased safety awareness, and decreased knowledge of AE/AD impacting participation in ADLs. Patients girlfriend was not in room at this time. Patient is unable to provide PLOF. Patient would continue to benefit from skilled OT services at this time while admitted and after d/c to address noted deficits in order to improve overall safety and independence in ADLs.   After patient was returned to bed, patient was noted to have a few seconds of eyes closed eye twitching. Nurse was called into room but had stopped already prior to arrival.  Nurse made MD aware.      Recommendations for follow up therapy are one component of a multi-disciplinary discharge planning process, led by the attending physician.  Recommendations may be updated based on patient status, additional functional  criteria and insurance authorization.   Follow Up Recommendations  Acute inpatient rehab (3hours/day)    Assistance Recommended at Discharge Frequent or constant Supervision/Assistance  Patient can return home with the following Two people to help with walking and/or transfers;Two people to help with bathing/dressing/bathroom;Direct supervision/assist for medications management;Help with stairs or ramp for entrance;Assist for transportation;Assistance with feeding;Direct supervision/assist for financial management;Assistance with cooking/housework    Functional Status Assessment  Patient has had a recent decline in their functional status and demonstrates the ability to make significant improvements in function in a reasonable and predictable amount of time.  Equipment Recommendations  Other (comment) (TBD)    Recommendations for Other Services PT consult;Rehab consult     Precautions / Restrictions Precautions Precautions: Fall Precaution Comments: seizures Restrictions Weight Bearing Restrictions: No      Mobility Bed Mobility Overal bed mobility: Needs Assistance Bed Mobility: Sit to Supine, Sit to Sidelying       Sit to supine: Max assist, +2 for safety/equipment, +2 for physical assistance Sit to sidelying: Total assist, +2 for safety/equipment, +2 for physical assistance General bed mobility comments: patient was noted to adduct LLE to indicate he wanted to get up on R side of bed when L side was set up. patient was TD for supine to sit on edge of bed with increased time and cues to participate in task.attempted standing with max A +3 support with patient noted to have flexion in knees and trunk        Balance Overall balance assessment: Needs assistance Sitting-balance support: Feet supported, Bilateral upper extremity supported Sitting balance-Leahy Scale: Poor                   ADL either performed or  assessed with clinical judgement   ADL Overall ADL's :  Needs assistance/impaired Eating/Feeding: Total assistance;Bed level   Grooming: Bed level;Total assistance   Upper Body Bathing: Bed level;Total assistance   Lower Body Bathing: Total assistance;Bed level   Upper Body Dressing : Total assistance;Bed level   Lower Body Dressing: Bed level;Total assistance   Toilet Transfer: +2 for physical assistance;+2 for safety/equipment Toilet Transfer Details (indicate cue type and reason): patient attempted standing with +3 today with inability to transition into upright standing with trunk flexion and knee flexion noted. Toileting- Clothing Manipulation and Hygiene: Total assistance;Bed level               Vision   Additional Comments: unable to formally assess scanning on this date with limited responses from patient. patient was noted to be able to move eyes side to side without moving head during session.     Perception     Praxis      Pertinent Vitals/Pain Pain Assessment Pain Assessment: Faces Faces Pain Scale: Hurts little more Pain Location: patient is unable to communicate where pain is specifically. patient has a h/o pain in R shoulder and wrist from past admissions Pain Intervention(s): Monitored during session, Premedicated before session     Hand Dominance Right   Extremity/Trunk Assessment Upper Extremity Assessment Upper Extremity Assessment: RUE deficits/detail;LUE deficits/detail RUE Deficits / Details: patient has a history of RUE pain and injuries. patient was able to minimally move digits of R hand to grasp bed rail between thumb and index finger. patient had a hard time following commands even with increased time making formal MMT hard. PROM not tested on this UE secondary to increased history of pain in UE. LUE Deficits / Details: patient noted to have minimal movement in L hand with some flexion noted but not able to squeeze fingers on this date. patient having increased difficulty with controling movemnets  when cued but able to touch thumb to phone to indicate he wanted the alarm turned off.   Lower Extremity Assessment Lower Extremity Assessment: Defer to PT evaluation RLE Coordination: decreased gross motor;decreased fine motor LLE Coordination: decreased gross motor;decreased fine motor   Cervical / Trunk Assessment Cervical / Trunk Assessment: Other exceptions Cervical / Trunk Exceptions: patients neck was noted to be positioned in extension looking ridgid for majority of session   Communication Communication Communication: No difficulties   Cognition Arousal/Alertness: Awake/alert Behavior During Therapy: Flat affect Overall Cognitive Status: Difficult to assess             General Comments: Patient noted to have difficulty with communication on this date with inconsistent responses. patient was able to answer questions about 25% of time with head movements or eye movements. this is a significant change from previous admission.     General Comments       Exercises     Shoulder Instructions      Home Living Family/patient expects to be discharged to:: Unsure Living Arrangements: Spouse/significant other Available Help at Discharge: Family;Available PRN/intermittently   Home Access: Level entry     Home Layout: One level     Bathroom Shower/Tub: Chief Strategy Officer: Standard     Home Equipment: None   Additional Comments: Home Living details from prior admission, pt unable to complete      Prior Functioning/Environment Prior Level of Function : Independent/Modified Independent             Mobility Comments: no device - worked as a Production designer, theatre/television/film  at [employer] ADLs Comments: independent        OT Problem List: Decreased activity tolerance;Impaired balance (sitting and/or standing);Decreased safety awareness;Cardiopulmonary status limiting activity;Decreased knowledge of precautions;Decreased knowledge of use of DME or AE      OT  Treatment/Interventions: Self-care/ADL training;Therapeutic exercise;Neuromuscular education;Energy conservation;DME and/or AE instruction;Therapeutic activities;Balance training;Patient/family education    OT Goals(Current goals can be found in the care plan section) Acute Rehab OT Goals Patient Stated Goal: none stated OT Goal Formulation: Patient unable to participate in goal setting Time For Goal Achievement: 02/16/22 Potential to Achieve Goals: Fair ADL Goals Pt Will Perform Eating: with supervision;sitting;with adaptive utensils Pt Will Perform Grooming: with set-up;sitting Pt Will Transfer to Toilet: with mod assist;stand pivot transfer;bedside commode  OT Frequency: Min 2X/week    Co-evaluation PT/OT/SLP Co-Evaluation/Treatment: Yes Reason for Co-Treatment: For patient/therapist safety;To address functional/ADL transfers;Complexity of the patient's impairments (multi-system involvement) PT goals addressed during session: Mobility/safety with mobility OT goals addressed during session: ADL's and self-care      AM-PAC OT "6 Clicks" Daily Activity     Outcome Measure Help from another person eating meals?: Total Help from another person taking care of personal grooming?: Total Help from another person toileting, which includes using toliet, bedpan, or urinal?: Total Help from another person bathing (including washing, rinsing, drying)?: Total Help from another person to put on and taking off regular upper body clothing?: Total Help from another person to put on and taking off regular lower body clothing?: Total 6 Click Score: 6   End of Session Equipment Utilized During Treatment: Gait belt Nurse Communication: Other (comment) (participation during session)  Activity Tolerance: Patient limited by fatigue Patient left: in bed;with call bell/phone within reach;with bed alarm set;with nursing/sitter in room  OT Visit Diagnosis: Unsteadiness on feet (R26.81);Other abnormalities  of gait and mobility (R26.89);Muscle weakness (generalized) (M62.81)                Time: 8295-6213 OT Time Calculation (min): 35 min Charges:  OT General Charges $OT Visit: 1 Visit OT Evaluation $OT Eval Moderate Complexity: 1 Mod  Sharyn Blitz OTR/L, MS Acute Rehabilitation Department Office# [phone] Pager# [phone]   Ardyth Harps 02/02/2022, 1:21 PM

## 2022-02-02 NOTE — Progress Notes (Signed)
Called to patient's room by PT/OT. While working with patient, PT/OT noted approximately 15 seconds of pt's eyes closed with eye lids twitching. Pt responding to tactile stimuli at this time and follows commands at times. Dr. Maryfrances Bunnell and charge nurse, Baird Lyons, made aware.

## 2022-02-03 DIAGNOSIS — R569 Unspecified convulsions: Secondary | ICD-10-CM | POA: Diagnosis not present

## 2022-02-03 DIAGNOSIS — R7303 Prediabetes: Secondary | ICD-10-CM

## 2022-02-03 DIAGNOSIS — R509 Fever, unspecified: Secondary | ICD-10-CM | POA: Diagnosis not present

## 2022-02-03 DIAGNOSIS — F419 Anxiety disorder, unspecified: Secondary | ICD-10-CM | POA: Diagnosis not present

## 2022-02-03 DIAGNOSIS — R7881 Bacteremia: Secondary | ICD-10-CM

## 2022-02-03 DIAGNOSIS — B182 Chronic viral hepatitis C: Secondary | ICD-10-CM | POA: Diagnosis not present

## 2022-02-03 DIAGNOSIS — E440 Moderate protein-calorie malnutrition: Secondary | ICD-10-CM

## 2022-02-03 DIAGNOSIS — F1990 Other psychoactive substance use, unspecified, uncomplicated: Secondary | ICD-10-CM | POA: Diagnosis not present

## 2022-02-03 DIAGNOSIS — F061 Catatonic disorder due to known physiological condition: Secondary | ICD-10-CM

## 2022-02-03 LAB — BASIC METABOLIC PANEL
Anion gap: 10 (ref 5–15)
BUN: 15 mg/dL (ref 6–20)
CO2: 24 mmol/L (ref 22–32)
Calcium: 9.4 mg/dL (ref 8.9–10.3)
Chloride: 106 mmol/L (ref 98–111)
Creatinine, Ser: 1.08 mg/dL (ref 0.61–1.24)
GFR, Estimated: >60 mL/min (ref >60)
Glucose, Bld: 88 mg/dL (ref 70–99)
Potassium: 4.3 mmol/L (ref 3.5–5.1)
Sodium: 140 mmol/L (ref 135–145)

## 2022-02-03 LAB — TSH: TSH: 4.7 u[IU]/mL — ABNORMAL HIGH (ref 0.350–4.500)

## 2022-02-03 LAB — CBC
HCT: 38.8 % — ABNORMAL LOW (ref 39.0–52.0)
Hemoglobin: 13.4 g/dL (ref 13.0–17.0)
MCH: 28.9 pg (ref 26.0–34.0)
MCHC: 34.5 g/dL (ref 30.0–36.0)
MCV: 83.6 fL (ref 80.0–100.0)
Platelets: 233 10*3/uL (ref 150–400)
RBC: 4.64 MIL/uL (ref 4.22–5.81)
RDW: 14.1 % (ref 11.5–15.5)
WBC: 9.5 10*3/uL (ref 4.0–10.5)
nRBC: 0 % (ref 0.0–0.2)

## 2022-02-03 LAB — CULTURE, BLOOD (ROUTINE X 2): Special Requests: ADEQUATE

## 2022-02-03 LAB — FOLATE: Folate: 30.5 ng/mL (ref >5.9)

## 2022-02-03 LAB — AMMONIA: Ammonia: 52 umol/L — ABNORMAL HIGH (ref 9–35)

## 2022-02-03 LAB — VITAMIN B12: Vitamin B-12: 611 pg/mL (ref 180–914)

## 2022-02-03 MED ORDER — LORAZEPAM 2 MG/ML IJ SOLN
2.0000 mg | Freq: Four times a day (QID) | INTRAMUSCULAR | Status: DC
Start: 2022-02-03 — End: 2022-02-04
  Administered 2022-02-03 – 2022-02-04 (×3): 2 mg via INTRAVENOUS
  Filled 2022-02-03 (×3): qty 1

## 2022-02-03 MED ORDER — LORAZEPAM 2 MG/ML IJ SOLN
2.0000 mg | Freq: Once | INTRAMUSCULAR | Status: AC
  Administered 2022-02-03: 2 mg via INTRAVENOUS
  Filled 2022-02-03: qty 1

## 2022-02-03 NOTE — Procedures (Signed)
Patient Name: Stephen Shepard  MRN: [id number]  Epilepsy Attending: Charlsie Quest  Referring Physician/Provider: Alberteen Sam, MD  Date: 02/02/2022 Duration: 26.20 mins  Patient history: 31 year old presenting with episode of decreased responsiveness, eye flutter.  EEG to evaluate for seizure.  Level of alertness: Awake  AEDs during EEG study: Ativan  Technical aspects: This EEG study was done with scalp electrodes positioned according to the 10-20 International system of electrode placement. Electrical activity was acquired at a sampling rate of 500Hz  and reviewed with a high frequency filter of 70Hz  and a low frequency filter of 1Hz . EEG data were recorded continuously and digitally stored.   Description: The posterior dominant rhythm consists of 8 Hz activity of moderate voltage (25-35 uV) seen predominantly in posterior head regions, symmetric and reactive to eye opening and eye closing.  There is also frontocentral 15 to 18 Hz beta activity. Hyperventilation and photic stimulation were not performed.     IMPRESSION: This study is within normal limits. No seizures or epileptiform discharges were seen throughout the recording.  Alzora Ha 

## 2022-02-03 NOTE — Progress Notes (Signed)
Physical Therapy Treatment Patient Details Name: Stephen Shepard MRN: [id number] DOB: [birth year] Today's Date: 02/03/2022   History of Present Illness Patient is a 31 year old male who presented to the hosptial with seizure like activity. patient was admitted with Acute metabolic encephalopathy and rigidity,Opioid withdrawal, fever with positive blood culture.   PMH: seizure, IV heroin drug use,Sepsis secondary to abscess of multiple sites of right upper extremity and Candida glabrata fungemia, Hep C,    PT Comments    Pt awake however making statements out of context.  Attempted to perform LE exercises however pt not participating.  AIR declined pt admission.  Pt may need higher level of care pending cognitive state.   Recommendations for follow up therapy are one component of a multi-disciplinary discharge planning process, led by the attending physician.  Recommendations may be updated based on patient status, additional functional criteria and insurance authorization.  Follow Up Recommendations  Skilled nursing-short term rehab (<3 hours/day)     Assistance Recommended at Discharge Frequent or constant Supervision/Assistance  Patient can return home with the following Two people to help with walking and/or transfers;Two people to help with bathing/dressing/bathroom;Assistance with cooking/housework;Assistance with feeding;Direct supervision/assist for medications management;Direct supervision/assist for financial management;Assist for transportation;Help with stairs or ramp for entrance   Equipment Recommendations  Wheelchair (measurements PT);Wheelchair cushion (measurements PT)    Recommendations for Other Services       Precautions / Restrictions Precautions Precautions: Fall Precaution Comments: seizures Restrictions Weight Bearing Restrictions: No     Mobility  Bed Mobility               General bed mobility comments: not attempted as pt not responding or  participating in exercises    Transfers                        Ambulation/Gait                   Stairs             Wheelchair Mobility    Modified Rankin (Stroke Patients Only)       Balance                                            Cognition Arousal/Alertness: Awake/alert Behavior During Therapy: Flat affect Overall Cognitive Status: Difficult to assess                                 General Comments: pt making out of context statements when asked questions, repeated stated, "take the cookies away"        Exercises General Exercises - Lower Extremity Ankle Circles/Pumps: 10 reps, PROM, Both Quad Sets: AAROM, Both, 10 reps Heel Slides: PROM, 10 reps, Both Hip ABduction/ADduction: PROM, Both, 10 reps Other Exercises Other Exercises: Attempted to have pt wave or reach for target however he was not able to either process or perform    General Comments        Pertinent Vitals/Pain Pain Assessment Pain Assessment: No/denies pain    Home Living                          Prior Function            PT Goals (  current goals can now be found in the care plan section) Progress towards PT goals: Progressing toward goals    Frequency    Min 3X/week      PT Plan Discharge plan needs to be updated    Co-evaluation              AM-PAC PT "6 Clicks" Mobility   Outcome Measure  Help needed turning from your back to your side while in a flat bed without using bedrails?: Total Help needed moving from lying on your back to sitting on the side of a flat bed without using bedrails?: Total Help needed moving to and from a bed to a chair (including a wheelchair)?: Total Help needed standing up from a chair using your arms (e.g., wheelchair or bedside chair)?: Total Help needed to walk in hospital room?: Total Help needed climbing 3-5 steps with a railing? : Total 6 Click Score: 6    End  of Session Equipment Utilized During Treatment: Gait belt Activity Tolerance: Patient tolerated treatment well Patient left: in bed;with call bell/phone within reach;with bed alarm set   PT Visit Diagnosis: Other abnormalities of gait and mobility (R26.89);Other symptoms and signs involving the nervous system (R29.898)     Time: 9604-5409 PT Time Calculation (min) (ACUTE ONLY): 10 min  Charges:  $Therapeutic Exercise: 8-22 mins                    Thomasene Mohair PT, DPT Physical Therapist Acute Rehabilitation Services Preferred contact method: Secure Chat Weekend Pager Only: [phone] Office: [phone]    Janan Halter Payson 02/03/2022, 5:15 PM

## 2022-02-03 NOTE — Consult Note (Signed)
[facility] Face-to-Face Psychiatry Consult   Reason for Consult:  Catatonia Referring Physician:  Dr. Maryfrances Bunnell Patient Identification: Stephen Shepard MRN:  [id number] Principal Diagnosis: Seizure-like activity (HCC) Diagnosis:  Principal Problem:   Seizure-like activity (HCC) Active Problems:   IVDU (intravenous drug user)   Prediabetes   Anxiety   Tobacco abuse   Chronic hepatitis C without hepatic coma (HCC)   Malnutrition of moderate degree   Fever and positive blood culture   Opioid use disorder, severe, dependence (HCC)   Opioid withdrawal (HCC)   Catatonia   Total Time spent with patient: 1.5 hours  Subjective:   Stephen Shepard is a 31 y.o. male patient admitted with seizure like activity. Psychiatry consulted for catatonia.  On today's initial reassessment, patient continued to present with increased amount of rigidity from baseline, withdrawal, mutism, fixed gaze/staring.  His catatonia screening score was initially 15, with positive scoring and ongoing signs of catatonia despite administration of oral Ativan.  Clinical decision was made to increase Ativan and administer via IV.  Patient had an immediate response, within 10 minutes he was displaying nonsensical speech, making eye contact, less rigid, able to follow commands.  His repeat catatonia screening score was 4, which indicates positive therapeutic response to Ativan challenge.  Following 2-hour post Ativan patient is able to complete full sentences, displayed linear thought processes, and engage well with psychiatric providers.  Patient continues to display some paranoia, secondary to fear of [place] entering his room and eating his food.  At times patient did become tearful, citing his loss of reality and inability to recall what prompted him to come into the hospital.  Did discuss current findings and clinical presentation, to help ease the patient and provide comfort.  Patient does admit to ongoing substance use with preference for  benzodiazepines.  At times he does become visibly frustrated when explaining his plea for seeking help for benzodiazepine rehabilitation, prescription refill request, and use of street drugs.  He states he is able to produce a 2 mg Xanax (bar) for $5; and becomes frustrated when remembering his girlfriend flushed them down the toilet.  Patient continues to remain limited with insight, while negative effects of substance abuse have been explored, patient is tearful, psychotic, and unable to process the above findings.  Psychiatry will continue to follow.  See treatment plan below.  HPI:  Stephen Shepard is a 31 y.o. male with a pertinent history of seizures as an adolescent, not on antiepileptics, anxiety, asthma, substance abuse history of IV heroin, Hep C, recent admission for multiple right upper extremity abscess, s/p I&D of the right shoulder and right wrist, r multiple right hand compartment tenosynovectomy on 5/18 with Dr. Orlan Leavens and Candida glabrata fungemia presents to the ER via EMS with "15 minutes of shaking". He states he had 2 seizures as a 31 year old but has not had any seizures since and is not on any antiepileptics. He was placed on klonopins he states back then. He currently alert and oriented, but states he feels like he got "hit by a truck" endorsing body aches.  He has not unwrapped his right hand for several weeks and states that he "cannot look at it"  Past Psychiatric History:  Polysubstance abuse. Denies any inpatient psychiatric admission. Denies any suicie attempts.   Risk to Self:   Denies, Limited due to psychosis Risk to Others:  Denies Prior Inpatient Therapy:   Denies Prior Outpatient Therapy:   Denies  Past Medical History:  Past Medical History:  Diagnosis  Date   Anxiety    Asthma    Seizures (HCC)    Substance abuse [facility])     Past Surgical History:  Procedure Laterality Date   I & D EXTREMITY Right 12/15/2021   Procedure: IRRIGATION AND DEBRIDEMENT RIGHT HAND AND  SHOULDER;  Surgeon: Bradly Bienenstock, MD;  Location: [facility] ORS;  Service: Orthopedics;  Laterality: Right;  upper and lower arm   TEE WITHOUT CARDIOVERSION N/A 12/21/2021   Procedure: TRANSESOPHAGEAL ECHOCARDIOGRAM (TEE);  Surgeon: Little Ishikawa, MD;  Location: [facility] ENDOSCOPY;  Service: Cardiovascular;  Laterality: N/A;   Family History: No family history on file. Family Psychiatric  History: Denies Social History:  Social History   Substance and Sexual Activity  Alcohol Use Never     Social History   Substance and Sexual Activity  Drug Use Yes   Types: IV    Social History   Socioeconomic History   Marital status: Single    Spouse name: Not on file   Number of children: Not on file   Years of education: Not on file   Highest education level: Not on file  Occupational History   Not on file  Tobacco Use   Smoking status: Every Day    Packs/day: 1.00    Types: Cigarettes   Smokeless tobacco: Never  Vaping Use   Vaping Use: Never used  Substance and Sexual Activity   Alcohol use: Never   Drug use: Yes    Types: IV   Sexual activity: Not Currently  Other Topics Concern   Not on file  Social History Narrative   Not on file   Social Determinants of Health   Financial Resource Strain: Not on file  Food Insecurity: Not on file  Transportation Needs: Not on file  Physical Activity: Not on file  Stress: Not on file  Social Connections: Not on file   Additional Social History:    Allergies:   Allergies  Allergen Reactions   Ibuprofen Anaphylaxis   Keppra [Levetiracetam] Anaphylaxis   Wasp Venom Protein Swelling    hives   Nitroglycerin Other (See Comments)    Pt reports "real bad chest pain"    Labs:  Results for orders placed or performed during the hospital encounter of 01/30/22 (from the past 48 hour(s))  CBC     Status: None   Collection Time: 02/02/22  5:35 AM  Result Value Ref Range   WBC 10.3 4.0 - 10.5 K/uL   RBC 4.91 4.22 - 5.81 MIL/uL    Hemoglobin 14.1 13.0 - 17.0 g/dL   HCT 54.0 98.1 - 19.1 %   MCV 84.1 80.0 - 100.0 fL   MCH 28.7 26.0 - 34.0 pg   MCHC 34.1 30.0 - 36.0 g/dL   RDW 47.8 29.5 - 62.1 %   Platelets 243 150 - 400 K/uL   nRBC 0.0 0.0 - 0.2 %    Comment: Performed at [facility], [address] [place], [place] [address]  Basic metabolic panel     Status: None   Collection Time: 02/02/22  5:35 AM  Result Value Ref Range   Sodium 141 135 - 145 mmol/L   Potassium 3.8 3.5 - 5.1 mmol/L   Chloride 107 98 - 111 mmol/L   CO2 26 22 - 32 mmol/L   Glucose, Bld 89 70 - 99 mg/dL    Comment: Glucose reference range applies only to samples taken after fasting for at least 8 hours.   BUN 20 6 -  20 mg/dL   Creatinine, Ser 2.77 0.61 - 1.24 mg/dL   Calcium 9.2 8.9 - 82.4 mg/dL   GFR, Estimated >23 >53 mL/min    Comment: (NOTE) Calculated using the CKD-EPI Creatinine Equation (2021)    Anion gap 8 5 - 15    Comment: Performed at [facility], [address], [place], [place] [address]  MRSA Next Gen by PCR, Nasal     Status: None   Collection Time: 02/02/22 12:54 PM   Specimen: Nasal Mucosa; Nasal Swab  Result Value Ref Range   MRSA by PCR Next Gen NOT DETECTED NOT DETECTED    Comment: (NOTE) The GeneXpert MRSA Assay (FDA approved for NASAL specimens only), is one component of a comprehensive MRSA colonization surveillance program. It is not intended to diagnose MRSA infection nor to guide or monitor treatment for MRSA infections. Test performance is not FDA approved in patients less than 25 years old. Performed at [facility], [address], [place], [place] [address]     Current Facility-Administered Medications  Medication Dose Route Frequency Provider Last Rate Last Admin   acetaminophen (TYLENOL) tablet 650 mg  650 mg Oral Q6H Danford, Earl Lites, MD   650 mg at 02/03/22 1150   bisacodyl (DULCOLAX) EC tablet 5 mg  5 mg Oral BID PRN Charlane Ferretti, DO   5 mg at 01/31/22 2114   enoxaparin (LOVENOX) injection 40 mg  40 mg Subcutaneous QHS Charlane Ferretti, DO   40 mg at 02/02/22 2134   lactated ringers 1,000 mL with potassium chloride 20 mEq infusion   Intravenous Continuous Alberteen Sam, MD 100 mL/hr at 02/03/22 0333 New Bag at 02/03/22 0333   LORazepam (ATIVAN) tablet 1 mg  1 mg Oral TID Cinderella, Margaret A   1 mg at 02/03/22 8676   multivitamin with minerals tablet 1 tablet  1 tablet Oral Daily Charlane Ferretti, DO   1 tablet at 02/03/22 0949   nicotine (NICODERM CQ - dosed in mg/24 hours) patch 21 mg  21 mg Transdermal Daily Charlane Ferretti, DO   21 mg at 02/03/22 0953   OLANZapine (ZYPREXA) tablet 2.5 mg  2.5 mg Oral TID PRN Cinderella, Margaret A       Or   OLANZapine (ZYPREXA) injection 2.5 mg  2.5 mg Intramuscular TID PRN Cinderella, Margaret A       OLANZapine (ZYPREXA) tablet 2.5 mg  2.5 mg Oral QHS Cinderella, Margaret A   2.5 mg at 02/02/22 2131   ondansetron (ZOFRAN) tablet 4 mg  4 mg Oral Q6H PRN Charlane Ferretti, DO   4 mg at 01/31/22 1711   Or   ondansetron (ZOFRAN) injection 4 mg  4 mg Intravenous Q6H PRN Charlane Ferretti, DO   4 mg at 01/31/22 1037   Oral care mouth rinse  15 mL Mouth Rinse 4 times per day Alberteen Sam, MD   15 mL at 02/03/22 0815   Oral care mouth rinse  15 mL Mouth Rinse PRN Danford, Earl Lites, MD       polyethylene glycol (MIRALAX / GLYCOLAX) packet 17 g  17 g Oral Daily PRN Charlane Ferretti, DO   17 g at 01/30/22 2022   thiamine tablet 100 mg  100 mg Oral Daily Charlane Ferretti, DO   100 mg at 02/03/22 0949   vancomycin (VANCOREADY) IVPB 1250 mg/250 mL  1,250 mg Intravenous Q12H Norva Pavlov, RPH 166.7 mL/hr at 02/03/22 1147 1,250 mg at 02/03/22 1147  Musculoskeletal: Strength & Muscle Tone: within normal limits Gait & Station: normal Patient leans: N/A   Psychiatric Specialty Exam:  Presentation  General Appearance: Bizarre; Disheveled (improved after ativan,  appropriate for environment)  Eye Contact:None; Other (comment) (improved after ativan, eyes open. fixed stare resolved)  Speech:Slow  Speech Volume:Decreased  Handedness:Right   Mood and Affect  Mood:Anxious; Dysphoric  Affect:Depressed   Thought Process  Thought Processes:Disorganized; Irrevelant  Descriptions of Associations:Loose  Orientation:Full (Time, Place and Person)  Thought Content:Logical  History of Schizophrenia/Schizoaffective disorder:No data recorded Duration of Psychotic Symptoms:No data recorded Hallucinations:Hallucinations: None  Ideas of Reference:Paranoia; Percusatory; Delusions  Suicidal Thoughts:Suicidal Thoughts: No  Homicidal Thoughts:Homicidal Thoughts: No   Sensorium  Memory:Immediate Fair; Recent Fair; Remote Fair  Judgment:Poor  Insight:Lacking   Executive Functions  Concentration:Poor  Attention Span:Poor  Recall:Poor  Fund of Knowledge:Poor  Language:Poor   Psychomotor Activity  Psychomotor Activity:Psychomotor Activity: Normal   Assets  Assets:Desire for Improvement; Manufacturing systems engineer; Leisure Time; Physical Health; Financial Resources/Insurance; Social Support   Sleep  Sleep:Sleep: Poor   Physical Exam: Physical Exam Vitals and nursing note reviewed.  Constitutional:      Appearance: He is normal weight.  Psychiatric:        Attention and Perception: He is inattentive.        Mood and Affect: Mood is anxious.        Behavior: Behavior is uncooperative, slowed and withdrawn.        Thought Content: Thought content is paranoid and delusional. Thought content does not include suicidal ideation.        Cognition and Memory: Memory is impaired.        Judgment: Judgment is impulsive and inappropriate.    Review of Systems  Psychiatric/Behavioral:  Positive for substance abuse. Negative for depression and suicidal ideas. The patient is nervous/anxious. The patient does not have insomnia.   All other  systems reviewed and are negative.  Blood pressure 139/86, pulse 70, temperature 99.6 F (37.6 C), temperature source Oral, resp. rate 15, height 6\' 2"  (1.88 m), weight 74.8 kg, SpO2 100 %. Body mass index is 21.18 kg/m.  Treatment Plan Summary: Daily contact with patient to assess and evaluate symptoms and progress in treatment, Medication management, and Plan Psychiatry will continue to follow.  -Positive therapeutic effect to Ativan challenge. Will start Ativan 2mg  IV Q6hr. -Continue zyprexa 2.5mg  po qhs, will increase as tolerated.  -Patient remains voluntary at this time, in the event patient attempts to leave he does meet criteria for involuntary commitment.  -Will start safety sitter, for continuous observation.  Suspect patient may become floridly psychotic once catatonia resolves.  Psychosis may be worse at nighttime.  Disposition: Recommend psychiatric Inpatient admission when medically cleared.  , FNP 02/03/2022 3:50 PM

## 2022-02-03 NOTE — Progress Notes (Signed)
[facility] Infectious Diseases Follow Up Note  Patient Identification: Patient Name: Stephen Shepard MRN: [id number] [place] Date: 01/30/2022  6:13 AM Age: 31 y.o.Today's Date: 02/03/2022   Reason for Visit: fevers   Principal Problem:   Seizure-like activity ([place]) Active Problems:   IVDU (intravenous drug user)   Prediabetes   Anxiety   Tobacco abuse   Chronic hepatitis C without hepatic coma (HCC)   Malnutrition of moderate degree   Fever and positive blood culture   Fever   Opioid use disorder, severe, dependence (HCC)   Opioid withdrawal (HCC)   Acute metabolic encephalopathy and rigidity  Antibiotics:  Vancomycin 7/3-c Zosyn 7/3 Cefepime 7/3   Lines/Hardware:  Interval Events: Fever curve is trending down, leukocytosis has resolved.  Neurology and Psychiatry consulted   Assessment #  Fevers, unclear cause - Venous duplex 7/6 age indeterminate superficial vein thrombosis  involving  the right cephalic vein and right basilic vein//age indeterminate superficial vein thrombosis  involving  the left basilic vein.  # [place] - Psychiatry consulted, on ativan PO  #  Recent candida glabrata fungemia   # Staph epidermidis/staph hominis in 1 sets from multiple sets : likely contamination   # Urine cx with MRSE with unremarkable UA and no reported GU symptoms  # Hepatitis C - fu OP for tx # IVDU/Opioid use d/o   Recommendations Continue vancomycin, pharmacy to dose given above concerns for superficial thrombophlebitis   Follow-up sensitivities Staph epidermidis from blood and urine cultures to see if concordant  Repeat blood cultures ordered yesterday was unable to be drawn per RN.  Follow-up blood cultures and fungal cultures to see yeast grows due to question about compliance with fluconazole outpatient  I will hold off on adding antifungals given fevers continue to downtrend and possibly thrombophlebitis causing  fevers  Follow-up neurology and psychiatry recs  Monitor fever curve, CBC  Dr. Gale Journey covering this weekend.  I am back Monday  Rest of the management as per the primary team. Thank you for the consult. Please page with pertinent questions or concerns.  ______________________________________________________________________ Subjective patient seen and examined at the bedside.  Did not respond much but nodded yes when asked did he recognize me from yesterday He was being fed by a nurse technician  Vitals BP 138/85 (BP Location: Left Arm)   Pulse 68   Temp 98.9 F (37.2 C) (Oral)   Resp 15   Ht 6\' 2"  (1.88 m)   Wt 74.8 kg   SpO2 100%   BMI 21.18 kg/m     Physical Exam Constitutional: sitting up in the bed and being fed by the nurse technician    Comments:   Cardiovascular:     Rate and Rhythm: Normal rate and regular rhythm.     Heart sounds:  Pulmonary:     Effort: Pulmonary effort is normal.     Comments:   Abdominal:     Palpations: Abdomen is distended    Tenderness:   Musculoskeletal:        General: No swelling or tenderness.   Skin:    Comments:   Neurological:     General: Does not respond much.  Poor eye contact.  Nodded yes when asked if he recognized me from yesterday  Pertinent Microbiology Results for orders placed or performed during the hospital encounter of 01/30/22  Blood Culture (routine x 2)     Status: None (Preliminary result)   Collection Time: 01/30/22  6:27 AM   Specimen: BLOOD  Result Value Ref Range Status   Specimen Description   Final    BLOOD BLOOD LEFT FOREARM Performed at [facility] [address] [place], [place] [address]    Special Requests   Final    BOTTLES DRAWN AEROBIC AND ANAEROBIC Blood Culture adequate volume Performed at [facility] [address] [place], [place] [address]    Culture   Final    NO GROWTH 4 DAYS Performed at [facility], [place] [address],  [place], [place] [address]    Report Status PENDING  Incomplete  Urine Culture     Status: Abnormal   Collection Time: 01/30/22  6:27 AM   Specimen: In/Out Cath Urine  Result Value Ref Range Status   Specimen Description   Final    IN/OUT CATH URINE Performed at [facility] [address] [place], [place] [address]    Special Requests   Final    NONE Performed at [facility], [place] [address] [place], [place] [address]    Culture 3,000 COLONIES/mL STAPHYLOCOCCUS EPIDERMIDIS (A)  Final   Report Status 02/01/2022 FINAL  Final   Organism ID, Bacteria STAPHYLOCOCCUS EPIDERMIDIS (A)  Final      Susceptibility   Staphylococcus epidermidis - MIC*    CIPROFLOXACIN 1 SENSITIVE Sensitive     GENTAMICIN 2 SENSITIVE Sensitive     NITROFURANTOIN <=16 SENSITIVE Sensitive     OXACILLIN >=4 RESISTANT Resistant     TETRACYCLINE >=16 RESISTANT Resistant     VANCOMYCIN 2 SENSITIVE Sensitive     TRIMETH/SULFA >=320 RESISTANT Resistant     CLINDAMYCIN >=8 RESISTANT Resistant     RIFAMPIN <=0.5 SENSITIVE Sensitive     Inducible Clindamycin NEGATIVE Sensitive     * 3,000 COLONIES/mL STAPHYLOCOCCUS EPIDERMIDIS  Culture, blood (Routine X 2) w Reflex to ID Panel     Status: Abnormal   Collection Time: 01/30/22 11:56 AM   Specimen: BLOOD RIGHT FOREARM  Result Value Ref Range Status   Specimen Description   Final    BLOOD RIGHT FOREARM Performed at [facility], [address], [place], [place] [address]    Special Requests   Final    BOTTLES DRAWN AEROBIC AND ANAEROBIC Blood Culture adequate volume Performed at [facility] [address] [place], [place] [address]    Culture  Setup Time   Final    GRAM POSITIVE COCCI IN BOTH AEROBIC AND ANAEROBIC BOTTLES CRITICAL RESULT CALLED TO, READ BACK BY AND VERIFIED WITH: PHARMD A.TECKER AT [id number] ON 01/31/2022 BY T.SAAD.    Culture (A)  Final    STAPHYLOCOCCUS EPIDERMIDIS STAPHYLOCOCCUS  CAPITIS THE SIGNIFICANCE OF ISOLATING THIS ORGANISM FROM A SINGLE VENIPUNCTURE CANNOT BE PREDICTED WITHOUT FURTHER CLINICAL AND CULTURE CORRELATION. SUSCEPTIBILITIES AVAILABLE ONLY ON REQUEST. Performed at [facility], [place] [address] [place], [place] [address]    Report Status 02/03/2022 FINAL  Final  Blood Culture ID Panel (Reflexed)     Status: Abnormal   Collection Time: 01/30/22 11:56 AM  Result Value Ref Range Status   Enterococcus faecalis NOT DETECTED NOT DETECTED Final   Enterococcus Faecium NOT DETECTED NOT DETECTED Final   Listeria monocytogenes NOT DETECTED NOT DETECTED Final   Staphylococcus species DETECTED (A) NOT DETECTED Final    Comment: CRITICAL RESULT CALLED TO, READ BACK BY AND VERIFIED WITH: PHARMD A.TECKER AT [id number] ON 01/31/2022 BY T.SAAD.    Staphylococcus aureus (BCID) NOT DETECTED NOT DETECTED Final  Staphylococcus epidermidis DETECTED (A) NOT DETECTED Final    Comment: Methicillin (oxacillin) resistant coagulase negative staphylococcus. Possible blood culture contaminant (unless isolated from more than one blood culture draw or clinical case suggests pathogenicity). No antibiotic treatment is indicated for blood  culture contaminants. CRITICAL RESULT CALLED TO, READ BACK BY AND VERIFIED WITH: PHARMD A.TECKER AT [id number] ON 01/31/2022 BY T.SAAD.    Staphylococcus lugdunensis NOT DETECTED NOT DETECTED Final   Streptococcus species NOT DETECTED NOT DETECTED Final   Streptococcus agalactiae NOT DETECTED NOT DETECTED Final   Streptococcus pneumoniae NOT DETECTED NOT DETECTED Final   Streptococcus pyogenes NOT DETECTED NOT DETECTED Final   A.calcoaceticus-baumannii NOT DETECTED NOT DETECTED Final   Bacteroides fragilis NOT DETECTED NOT DETECTED Final   Enterobacterales NOT DETECTED NOT DETECTED Final   Enterobacter cloacae complex NOT DETECTED NOT DETECTED Final   Escherichia coli NOT DETECTED NOT DETECTED Final   Klebsiella aerogenes NOT DETECTED NOT DETECTED  Final   Klebsiella oxytoca NOT DETECTED NOT DETECTED Final   Klebsiella pneumoniae NOT DETECTED NOT DETECTED Final   Proteus species NOT DETECTED NOT DETECTED Final   Salmonella species NOT DETECTED NOT DETECTED Final   Serratia marcescens NOT DETECTED NOT DETECTED Final   Haemophilus influenzae NOT DETECTED NOT DETECTED Final   Neisseria meningitidis NOT DETECTED NOT DETECTED Final   Pseudomonas aeruginosa NOT DETECTED NOT DETECTED Final   Stenotrophomonas maltophilia NOT DETECTED NOT DETECTED Final   Candida albicans NOT DETECTED NOT DETECTED Final   Candida auris NOT DETECTED NOT DETECTED Final   Candida glabrata NOT DETECTED NOT DETECTED Final   Candida krusei NOT DETECTED NOT DETECTED Final   Candida parapsilosis NOT DETECTED NOT DETECTED Final   Candida tropicalis NOT DETECTED NOT DETECTED Final   Cryptococcus neoformans/gattii NOT DETECTED NOT DETECTED Final   Methicillin resistance mecA/C DETECTED (A) NOT DETECTED Final    Comment: CRITICAL RESULT CALLED TO, READ BACK BY AND VERIFIED WITH: PHARMD A.TECKER AT [id number] ON 01/31/2022 BY T.SAAD. Performed at [facility], [place] [address] [place], [place] [address]   SARS Coronavirus 2 by RT PCR (hospital order, performed in [facility] hospital lab) *cepheid single result test* Anterior Nasal Swab     Status: None   Collection Time: 01/30/22 12:10 PM   Specimen: Anterior Nasal Swab  Result Value Ref Range Status   SARS Coronavirus 2 by RT PCR NEGATIVE NEGATIVE Final    Comment: (NOTE) SARS-CoV-2 target nucleic acids are NOT DETECTED.  The SARS-CoV-2 RNA is generally detectable in upper and lower respiratory specimens during the acute phase of infection. The lowest concentration of SARS-CoV-2 viral copies this assay can detect is 250 copies / mL. A negative result does not preclude SARS-CoV-2 infection and should not be used as the sole basis for treatment or other patient management decisions.  A negative result may occur  with improper specimen collection / handling, submission of specimen other than nasopharyngeal swab, presence of viral mutation(s) within the areas targeted by this assay, and inadequate number of viral copies (<250 copies / mL). A negative result must be combined with clinical observations, patient history, and epidemiological information.  Fact Sheet for Patients:   https://www.patel.info/  Fact Sheet for Healthcare Providers: https://hall.com/  This test is not yet approved or  cleared by the [place] FDA and has been authorized for detection and/or diagnosis of SARS-CoV-2 by FDA under an Emergency Use Authorization (EUA).  This EUA will remain in effect (meaning this test can be used) for the  duration of the COVID-19 declaration under Section 564(b)(1) of the Act, 21 U.S.C. section 360bbb-3(b)(1), unless the authorization is terminated or revoked sooner.  Performed at [facility], [place] [address], [place], [place] [address]   Fungus culture, blood     Status: None (Preliminary result)   Collection Time: 01/30/22  2:00 PM   Specimen: BLOOD  Result Value Ref Range Status   Specimen Description   Final    BLOOD FUN Performed at [facility] [address] [place], [place] [address]    Special Requests   Final    BOTTLES DRAWN AEROBIC AND ANAEROBIC Blood Culture results may not be optimal due to an excessive volume of blood received in culture bottles Performed at [place] [address] [place], [place] [address]    Culture   Final    NO GROWTH 4 DAYS Performed at [facility], [place] [address], [place], [place] [address]    Report Status PENDING  Incomplete  Culture, blood (Routine X 2) w Reflex to ID Panel     Status: None (Preliminary result)   Collection Time: 01/30/22  5:53 PM   Specimen: BLOOD  Result Value Ref Range Status   Specimen Description    Final    BLOOD RIGHT ANTECUBITAL Performed at [facility] [address] [place], [place] [address]    Special Requests   Final    BOTTLES DRAWN AEROBIC ONLY Blood Culture adequate volume Performed at [facility] [place] [address], [place], [place] [address]    Culture   Final    NO GROWTH 4 DAYS Performed at [facility], [place] [address] [place], [place] [address]    Report Status PENDING  Incomplete  MRSA Next Gen by PCR, Nasal     Status: None   Collection Time: 02/02/22 12:54 PM   Specimen: Nasal Mucosa; Nasal Swab  Result Value Ref Range Status   MRSA by PCR Next Gen NOT DETECTED NOT DETECTED Final    Comment: (NOTE) The GeneXpert MRSA Assay (FDA approved for NASAL specimens only), is one component of a comprehensive MRSA colonization surveillance program. It is not intended to diagnose MRSA infection nor to guide or monitor treatment for MRSA infections. Test performance is not FDA approved in patients less than 76 years old. Performed at [facility], [place] [address], [place], [place] [address]     Pertinent Lab.    Latest Ref Rng & Units 02/02/2022    5:35 AM 02/01/2022    5:04 AM 01/31/2022    7:28 AM  CBC  WBC 4.0 - 10.5 K/uL 10.3  11.0  11.1   Hemoglobin 13.0 - 17.0 g/dL 14.1  15.1  14.2   Hematocrit 39.0 - 52.0 % 41.3  44.2  41.8   Platelets 150 - 400 K/uL 243  239  248       Latest Ref Rng & Units 02/02/2022    5:35 AM 02/01/2022    5:04 AM 01/31/2022    7:28 AM  CMP  Glucose 70 - 99 mg/dL 89  109  112   BUN 6 - 20 mg/dL 20  23  20    Creatinine 0.61 - 1.24 mg/dL 0.98  1.13  1.31   Sodium 135 - 145 mmol/L 141  142  141   Potassium 3.5 - 5.1 mmol/L 3.8  3.8  3.9   Chloride 98 - 111 mmol/L 107  107  105   CO2 22 - 32 mmol/L 26  26  25    Calcium 8.9 - 10.3 mg/dL 9.2  9.6  9.4   Total Protein 6.5 - 8.1 g/dL  8.9  8.5   Total Bilirubin 0.3 - 1.2 mg/dL  1.1  1.0   Alkaline Phos 38 - 126 U/L  70  75    AST 15 - 41 U/L  50  39   ALT 0 - 44 U/L  40  40      Pertinent Imaging today Plain films and CT images have been personally visualized and interpreted; radiology reports have been reviewed. Decision making incorporated into the Impression / Recommendations.  EEG adult  Result Date: 02/03/2022 04/06/2022, MD     02/03/2022  8:34 AM Patient Name: Stephen Shepard MRN: Stephen Shepard Epilepsy Attending: [id number] Referring Physician/Provider: Charlsie Quest, MD Date: 02/02/2022 Duration: 26.20 mins Patient history: 31 year old presenting with episode of decreased responsiveness, eye flutter.  EEG to evaluate for seizure. Level of alertness: Awake AEDs during EEG study: Ativan Technical aspects: This EEG study was done with scalp electrodes positioned according to the 10-20 International system of electrode placement. Electrical activity was acquired at a sampling rate of 500Hz  and reviewed with a high frequency filter of 70Hz  and a low frequency filter of 1Hz . EEG data were recorded continuously and digitally stored. Description: The posterior dominant rhythm consists of 8 Hz activity of moderate voltage (25-35 uV) seen predominantly in posterior head regions, symmetric and reactive to eye opening and eye closing.  There is also frontocentral 15 to 18 Hz beta activity. Hyperventilation and photic stimulation were not performed.   IMPRESSION: This study is within normal limits. No seizures or epileptiform discharges were seen throughout the recording. Priyanka O Yadav   VAS 26 UPPER EXTREMITY VENOUS DUPLEX  Result Date: 02/02/2022 UPPER VENOUS STUDY  Patient Name:  Stephen Shepard  Date of Exam:   02/02/2022 Medical Rec #: [place]     Accession #:    04/05/2022 Date of Birth: 1990/12/03     Patient Gender: M Patient Age:   72 years Exam Location:  [facility] Procedure:      VAS [id number] UPPER EXTREMITY VENOUS DUPLEX Referring Phys: 04/16/1989  --------------------------------------------------------------------------------  Indications: "fevers of unclear cause" Limitations: Patient rigidity (won't move arms away from body, very stiff. Comparison Study: No previous studies Performing Technologist: Jody Hill RVT, RDMS  Examination Guidelines: A complete evaluation includes B-mode imaging, spectral Doppler, color Doppler, and power Doppler as needed of all accessible portions of each vessel. Bilateral testing is considered an integral part of a complete examination. Limited examinations for reoccurring indications may be performed as noted.  Right Findings: +----------+------------+---------+-----------+----------+-----------------+ RIGHT     CompressiblePhasicitySpontaneousProperties     Summary      +----------+------------+---------+-----------+----------+-----------------+ IJV           Full       Yes       Yes                                +----------+------------+---------+-----------+----------+-----------------+ Subclavian    Full       Yes       Yes                                +----------+------------+---------+-----------+----------+-----------------+ Axillary  Not visualized   +----------+------------+---------+-----------+----------+-----------------+ Brachial      Full       Yes       Yes                                +----------+------------+---------+-----------+----------+-----------------+ Radial        Full                                                    +----------+------------+---------+-----------+----------+-----------------+ Ulnar         Full                                                    +----------+------------+---------+-----------+----------+-----------------+ Cephalic      None       No        No               Age Indeterminate +----------+------------+---------+-----------+----------+-----------------+ Basilic      Partial      No        No               Age Indeterminate +----------+------------+---------+-----------+----------+-----------------+  Left Findings: +----------+------------+---------+-----------+----------+-------------------+ LEFT      CompressiblePhasicitySpontaneousProperties      Summary       +----------+------------+---------+-----------+----------+-------------------+ IJV           Full       Yes       Yes                                  +----------+------------+---------+-----------+----------+-------------------+ Subclavian    Full       Yes       Yes                                  +----------+------------+---------+-----------+----------+-------------------+ Axillary      Full                                  not well visualized +----------+------------+---------+-----------+----------+-------------------+ Brachial      Full       Yes       Yes                                  +----------+------------+---------+-----------+----------+-------------------+ Radial        Full                                                      +----------+------------+---------+-----------+----------+-------------------+ Ulnar         Full                                                      +----------+------------+---------+-----------+----------+-------------------+  Cephalic      None       No        No                Age Indeterminate  +----------+------------+---------+-----------+----------+-------------------+ Basilic                                               Not visualized    +----------+------------+---------+-----------+----------+-------------------+  Summary:  Right: 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 However, unable to visualize the axillary.  Left: Findings consistent with age indeterminate superficial vein thrombosis involving the left  basilic vein. However, unable to visualize the basilic.  *See table(s) above for measurements and observations.  Diagnosing physician: Jamelle Haring Electronically signed by Jamelle Haring on 02/02/2022 at 5:50:29 PM.    Final    VAS [place] LOWER EXTREMITY VENOUS (DVT)  Result Date: 02/02/2022  Lower Venous DVT Study Patient Name:  Stephen Shepard  Date of Exam:   02/02/2022 Medical Rec #: [id number]     Accession #:    [id number] Date of Birth: [birth year]     Patient Gender: M Patient Age:   89 years Exam Location[facility]  [facility] Procedure:      VAS [place] LOWER EXTREMITY VENOUS (DVT) Referring Phys: Rosiland Oz --------------------------------------------------------------------------------  Indications: "fever of unclear cause".  Comparison Study: No previous exams Performing Technologist: Jody Hill RVT, RDMS  Examination Guidelines: A complete evaluation includes B-mode imaging, spectral Doppler, color Doppler, and power Doppler as needed of all accessible portions of each vessel. Bilateral testing is considered an integral part of a complete examination. Limited examinations for reoccurring indications may be performed as noted. The reflux portion of the exam is performed with the patient in reverse Trendelenburg.  +---------+---------------+---------+-----------+----------+--------------+ RIGHT    CompressibilityPhasicitySpontaneityPropertiesThrombus Aging +---------+---------------+---------+-----------+----------+--------------+ CFV      Full           Yes      Yes                                 +---------+---------------+---------+-----------+----------+--------------+ SFJ      Full                                                        +---------+---------------+---------+-----------+----------+--------------+ FV Prox  Full           Yes      Yes                                 +---------+---------------+---------+-----------+----------+--------------+ FV Mid   Full            Yes      Yes                                 +---------+---------------+---------+-----------+----------+--------------+ FV DistalFull           Yes      Yes                                 +---------+---------------+---------+-----------+----------+--------------+  PFV      Full                                                        +---------+---------------+---------+-----------+----------+--------------+ POP      Full           Yes      Yes                                 +---------+---------------+---------+-----------+----------+--------------+ PTV      Full                                                        +---------+---------------+---------+-----------+----------+--------------+ PERO     Full                                                        +---------+---------------+---------+-----------+----------+--------------+   +---------+---------------+---------+-----------+----------+--------------+ LEFT     CompressibilityPhasicitySpontaneityPropertiesThrombus Aging +---------+---------------+---------+-----------+----------+--------------+ CFV      Full           Yes      Yes                                 +---------+---------------+---------+-----------+----------+--------------+ SFJ      Full                                                        +---------+---------------+---------+-----------+----------+--------------+ FV Prox  Full           Yes      Yes                                 +---------+---------------+---------+-----------+----------+--------------+ FV Mid   Full           Yes      Yes                                 +---------+---------------+---------+-----------+----------+--------------+ FV DistalFull           Yes      Yes                                 +---------+---------------+---------+-----------+----------+--------------+ PFV      Full                                                         +---------+---------------+---------+-----------+----------+--------------+  POP      Full           Yes      Yes                                 +---------+---------------+---------+-----------+----------+--------------+ PTV      Full                                                        +---------+---------------+---------+-----------+----------+--------------+ PERO     Full                                                        +---------+---------------+---------+-----------+----------+--------------+     Summary: BILATERAL: - No evidence of deep vein thrombosis seen in the lower extremities, bilaterally. -No evidence of popliteal cyst, bilaterally.   *See table(s) above for measurements and observations. Electronically signed by Heath Lark on 02/02/2022 at 5:48:25 PM.    Final     I spent 40 minutes for this patient encounter including review of prior medical records, coordination of care with primary/other specialist with greater than 50% of time being face to face/counseling and discussing diagnostics/treatment plan with the patient/family.  Electronically signed by:   Odette Fraction, MD Infectious Disease Physician [facility] Pager: [phone]

## 2022-02-03 NOTE — Progress Notes (Signed)
Phlebotomist still unable to get any blood today, not getting blood returns. Dr. Maryfrances Bunnell notified.

## 2022-02-03 NOTE — Progress Notes (Signed)
Progress Note   Patient: Stephen Shepard [id number]:[id number] DOB: Jul 09, 1991 DOA: 01/30/2022     3 DOS: the patient was seen and examined on 02/03/2022        Brief hospital course: Mr. Figgs is a 31 y.o. M with hx IVDU, recent admission for hand tenosynovitis and candida fungemia who presented with seizure like activity.  The patient provides inconsistent versions of the history to different providers, but conservatively, it appears he may have had a period of loss of consciousness and uncontrolled shaking while waking up.  In the ER, MRI brain normal.  He was in withdrawal and was started on Suboxone and admitted for seizure eval.     7/3: Admitted and started on Suboxone, MRI brain normal 7/4: Fevered again, blood cultures with 1/3 positive for S epi, EEG normal 7/5: Mental status change, CTH normal, refused LP 7/6: ID and Neuro consulted     Assessment and Plan: * Seizure-like activity [facility]) This was the initial presenting complaint.    However, MRI normal. EEG normal. No GTC seizures in the hospital.  Repeat EEG again normal after change in MS. I agree with neuro, there is no role for AEDs at this time.     Fever    Fever and positive blood culture The patient was discharged from the hospital 2 months ago with a probucol hand infection and with Candida fungemia.  He completed all the 3 days of both treatments in the hospital, and I think even if he did not adhere to the treatment regimens for both last time, it is unlikely he has residual fungemia.  Cultures now with 1 of 3 cultures growing Staph epidermidis and Staph capitis and urine with 5K cfus S epi as well. The hand was evaluated and is definitely not infected.  He has no other joint pains or skin lesions that could be a focus of infection.  CXR clear and CT abdomen and pelvis without focal findings on admission.  Attempted LP yesterday but patient refused, agree with ID not clinically consistent with meningitis. - Continue  vancomycin - Follow cultures - Consult ID, appreciate expertise, they plan to compare staph epi susc profiles   Catatonia On admission, patient had odd affect, but was oriented, appropriate and interactive.  Had a new change 7/5 which primarily appeared to be a rigid catatonia.  MRI brain on admission normal.  Repeat CT head 7/5 normal. Refused LP.  Prior EEG normal.  Repeat EEG normal.    On 7/6 given a test dose of IV Ativan and had marked animation, became interactive, just psychotic and disorganized.  CNS infection doubted. - Continue TID Ativan PO - Nightly Zyprexa - Consult Psychiatry - Follow ammonia, thiamine, folate, B12, TSH if we are able to get the  Opioid withdrawal (HCC) In the setting of catatonia, and without any complaint of withdrawal symptoms, I am not sure there is a role for Suboxone. - Hold Suboxone - Continue hydroxyzine (which he takes at home) and ondansetron as needed  Opioid use disorder, severe, dependence (HCC) - TOC consult for resources handout  Malnutrition of moderate degree    Chronic hepatitis C without hepatic coma [facility]) Patient has [facility] appt on 7/11   Tobacco abuse    Anxiety Not currently on treatment other than hydroxyzine  Prediabetes    IVDU (intravenous drug user) - Consult TOC for assistance with Suboxone references so patient can make an appointment after discharge          Subjective: Through  the day today and nursing report patient will follow some commands, not others, he has eaten some food, but only when fed, he was mostly not oriented and most of the time.  Does not respond to questions appropriately.  To me he is unable to provide meaningful history.      Physical Exam: Vitals:   02/02/22 2022 02/03/22 0542 02/03/22 0953 02/03/22 1205  BP: (!) 157/102 (!) 143/100 138/85 139/86  Pulse: (!) 58 62 68 70  Resp: 20 18 15    Temp: (!) 97.4 F (36.3 C) 98.3 F (36.8 C) 98.9 F (37.2 C) 99.6 F (37.6 C)   TempSrc: Axillary Oral Oral Oral  SpO2: 100% 100% 100% 100%  Weight:      Height:       Thin adult male, lying in bed, no diaphoresis, agitation. He is sluggish, eyes are closed, he opens eyes occasionally, does not hold eye contact He follows some commands, not others RRR, no murmurs, no peripheral edema, no JVD Respiratory rate normal, lungs clear without rales or wheezes He grimaces to palpation of the abdomen and all quadrants, nothing localizing, has voluntary guarding Attention diminished, with little things he says, do not make sense, he has increased tone in both upper extremities, and lower extremities, follows commands, appears to have equal strength     Data Reviewed: No new labs, discussed with psychiatry and neurology  Family Communication:     Disposition: Status is: Inpatient         Author: , MD 02/03/2022 1:49 PM  For on call review [email or web address].

## 2022-02-03 NOTE — Progress Notes (Signed)
   02/03/22 1815  Charting Type  Focused Reassessment No Changes Neurological  Neurological  Neuro (WDL) X  Orientation Level Oriented to person;Oriented to place;Oriented to time;Oriented to situation  Cognition Poor attention/concentration;Follows commands;Memory impairment  Speech Clear  R Pupil Size (mm) 3  R Pupil Shape Round  R Pupil Reaction Brisk  L Pupil Size (mm) 3  L Pupil Shape Round  L Pupil Reaction Brisk  Motor Function/Sensation Assessment Grip;Dorsiflexion;Plantar flexion  Facial Symmetry Symmetrical  R Hand Grip Moderate  L Hand Grip Moderate  R Foot Dorsiflexion Moderate  L Foot Dorsiflexion Moderate  R Foot Plantar Flexion Moderate  L Foot Plantar Flexion Moderate  Neurological  Level of Consciousness Alert   Sinclair Ship, RN

## 2022-02-04 DIAGNOSIS — I809 Phlebitis and thrombophlebitis of unspecified site: Secondary | ICD-10-CM

## 2022-02-04 DIAGNOSIS — F061 Catatonic disorder due to known physiological condition: Secondary | ICD-10-CM | POA: Diagnosis not present

## 2022-02-04 DIAGNOSIS — I339 Acute and subacute endocarditis, unspecified: Secondary | ICD-10-CM | POA: Diagnosis not present

## 2022-02-04 DIAGNOSIS — B182 Chronic viral hepatitis C: Secondary | ICD-10-CM | POA: Diagnosis not present

## 2022-02-04 DIAGNOSIS — F1990 Other psychoactive substance use, unspecified, uncomplicated: Secondary | ICD-10-CM | POA: Diagnosis not present

## 2022-02-04 DIAGNOSIS — R569 Unspecified convulsions: Secondary | ICD-10-CM | POA: Diagnosis not present

## 2022-02-04 DIAGNOSIS — F419 Anxiety disorder, unspecified: Secondary | ICD-10-CM | POA: Diagnosis not present

## 2022-02-04 LAB — CULTURE, BLOOD (ROUTINE X 2)
Culture: NO GROWTH
Culture: NO GROWTH
Special Requests: ADEQUATE
Special Requests: ADEQUATE

## 2022-02-04 LAB — CREATININE, SERUM
Creatinine, Ser: 1.01 mg/dL (ref 0.61–1.24)
GFR, Estimated: >60 mL/min (ref >60)

## 2022-02-04 MED ORDER — GABAPENTIN 300 MG PO CAPS
300.0000 mg | ORAL_CAPSULE | Freq: Two times a day (BID) | ORAL | Status: DC
  Administered 2022-02-04 – 2022-02-06 (×5): 300 mg via ORAL
  Filled 2022-02-04 (×5): qty 1

## 2022-02-04 MED ORDER — LORAZEPAM 2 MG/ML IJ SOLN
2.0000 mg | Freq: Three times a day (TID) | INTRAMUSCULAR | Status: DC | PRN
  Administered 2022-02-04 – 2022-02-05 (×2): 2 mg via INTRAVENOUS
  Filled 2022-02-04 (×2): qty 1

## 2022-02-04 MED ORDER — LINEZOLID 600 MG PO TABS
600.0000 mg | ORAL_TABLET | Freq: Two times a day (BID) | ORAL | Status: AC
  Administered 2022-02-04 – 2022-02-05 (×4): 600 mg via ORAL
  Filled 2022-02-04 (×4): qty 1

## 2022-02-04 NOTE — Progress Notes (Signed)
Progress Note   Patient: Stephen Shepard [id number]:[id number] DOB: [birth year] DOA: 01/30/2022     4 DOS: the patient was seen and examined on 02/04/2022 at 9:45AM      Brief hospital course: Mr. Enberg is a 31 y.o. M with hx IVDU, recent admission for hand tenosynovitis and candida fungemia who presented with seizure like activity.  The patient provides inconsistent versions of the history to different providers, but conservatively, it appears he may have had a period of loss of consciousness and uncontrolled shaking while waking up.  In the ER, MRI brain normal.  He was in withdrawal and was started on Suboxone and admitted for seizure eval.     7/3: Admitted and started on Suboxone, MRI brain normal 7/4: Fevered again, blood cultures with 1/3 positive for S epi, EEG normal 7/5: Mental status change, CTH normal, refused LP 7/6: ID and Neuro consulted; appeared to have positive response to Ativan trial 7/7: In the afternoon, patient lucid and oriented with significant other, [name]     Assessment and Plan: * Seizure-like activity (HCC) Resolved.  See prior summary.    Fever and positive blood culture See prior summary.  Unclear cause of fever.  ?superficial thrombophlebitis, low suspicion for CNS infection, low suspicion this is Staph epi bacteremia.  - Continue vancomycin for now - Consult ID, appreciate expertise   Catatonia See prior summary. Started on PO Ativan yesterday.  Seemed to have no improvement in the morning, but dose increased in the afternoon and significant other reports that he was able to call her and was rational and coherent and oriented around noon (of note: this is 2 hours *after* he was evalauted by me and Psychiatry and did not give any appropriate or oriented answers).  She also reports he was oriented and appropriate overnight.  He became agitated after getting Zyprexa because he looked up the indication, saw it was for Bipolar disorder and became upset.     Ammonia somewhat elevated. B12 normal.  TSH slightly above normal.  Folate normal.  I do not think any of these are driving his symptoms. - Continue TID Ativan PO - Defer Zyprexa to Psychiatry - Consult Psychiatry    Opioid withdrawal (HCC) See prior note - Hold Suboxone          Subjective: Overnight, significant other reports that the patient was lucid, rational, demonstrating goal oriented behavior (able to search for side effects of a medicine on his phone).  This morning he will not respond to touch or voice.  No respiratory distress, no fever yesterday, no vomiting     Physical Exam: Vitals:   02/03/22 1700 02/03/22 2052 02/03/22 2326 02/04/22 0536  BP: 98/63 99/65 (!) 92/57 118/78  Pulse: 93 84 61 61  Resp: 20 20  18   Temp: 99.4 F (37.4 C) 98.2 F (36.8 C)  98 F (36.7 C)  TempSrc: Oral Oral  Oral  SpO2: 100% 100%  100%  Weight:      Height:       Thin adult male, lying in bed, appears asleep, does not arouse to touch or voice, even with noxious stimuli. Tachycardic, no murmurs, no lower extremity edema Respiratory rate normal, lungs clear without rales or wheezes Grimace to palpation in all quadrants, but no voluntary guarding, no rigidity, no distention, no masses He pretends to be sleeping, does not respond to questions, does not follow commands  Data Reviewed: Basic metabolic panel is normal Complete blood count is normal Ammonia is 52  Folate normal B12 normal TSH slightly elevated  Family Communication: Significant other at the bedside    Disposition: Status is: Inpatient The patient was admitted for seizure-like activity, probably a seizure in the setting of Xanax abuse.  In the hospital he subsequently developed catatonia.  He had fever of unknown origin for several days, possibly related to withdrawal which is now resolved.  Infectious disease were consulted to determine whether he should continue on antibiotic therapy or not.  In the  meantime his catatonia is being treated by psychiatry and disposition pends resolution of his catatonia        Author: Alberteen Sam, MD 02/04/2022 10:02 AM  For on call review [email or web address].

## 2022-02-04 NOTE — Consult Note (Addendum)
[facility] Face-to-Face Psychiatry Consult   Reason for Consult:  Catatonia Referring Physician:  Dr. Loleta Books Patient Identification: Stephen Shepard MRN:  [id number] Principal Diagnosis: Seizure-like activity ([place]) Diagnosis:  Principal Problem:   Seizure-like activity ([place]) Active Problems:   IVDU (intravenous drug user)   Prediabetes   Anxiety   Tobacco abuse   Chronic hepatitis C without hepatic coma (HCC)   Malnutrition of moderate degree   Fever and positive blood culture   Opioid use disorder, severe, dependence ([place])   Opioid withdrawal ([place])   Catatonia   Total Time spent with patient: 30 minutes  Subjective: '' I refused to continue taking Olanzapine, it made me agitated and besides I don't have Bipolar or Schizophrenia.''  Objectives: Patient seen face to face today in the presence of his girlfriend with his permission. He is  31 y.o. male who admitted with seizure like activity. Psychiatry consulted for catatonia. Today, he is alert, oriented x 4 and able to verbalize his feelings.But he gets easily agitated, frustrated, apprehensive and irritable. He states that he is not interested in taking Olanzapine anymore due to blurred vision and increased agitation after taking it. He states that the Ativan has been helpful but wants a medication he can take for agitation, mood swings, pain, anxiety and would not get into trouble with the law. Patient does admit to ongoing substance use with preference for benzodiazepines but wants to be clean before he is discharged home. Today, he denies psychosis, delusions,and self harming thoughts.  Past Psychiatric History:  Polysubstance abuse. Denies any inpatient psychiatric admission. Denies any suicie attempts.   Risk to Self:   Denies Risk to Others:  Denies Prior Inpatient Therapy:   Denies Prior Outpatient Therapy:   Denies  Past Medical History:  Past Medical History:  Diagnosis Date   Anxiety    Asthma    Seizures ([place])    Substance  abuse ([place])     Past Surgical History:  Procedure Laterality Date   I & D EXTREMITY Right 12/15/2021   Procedure: IRRIGATION AND DEBRIDEMENT RIGHT HAND AND SHOULDER;  Surgeon: Iran Planas, MD;  Location: [facility] ORS;  Service: Orthopedics;  Laterality: Right;  upper and lower arm   TEE WITHOUT CARDIOVERSION N/A 12/21/2021   Procedure: TRANSESOPHAGEAL ECHOCARDIOGRAM (TEE);  Surgeon: Donato Heinz, MD;  Location: [facility] ENDOSCOPY;  Service: Cardiovascular;  Laterality: N/A;   Family History: No family history on file. Family Psychiatric  History: Denies Social History:  Social History   Substance and Sexual Activity  Alcohol Use Never     Social History   Substance and Sexual Activity  Drug Use Yes   Types: IV    Social History   Socioeconomic History   Marital status: Single    Spouse name: Not on file   Number of children: Not on file   Years of education: Not on file   Highest education level: Not on file  Occupational History   Not on file  Tobacco Use   Smoking status: Every Day    Packs/day: 1.00    Types: Cigarettes   Smokeless tobacco: Never  Vaping Use   Vaping Use: Never used  Substance and Sexual Activity   Alcohol use: Never   Drug use: Yes    Types: IV   Sexual activity: Not Currently  Other Topics Concern   Not on file  Social History Narrative   Not on file   Social Determinants of Health   Financial Resource Strain: Not on file  Food Insecurity: Not on file  Transportation Needs: Not on file  Physical Activity: Not on file  Stress: Not on file  Social Connections: Not on file   Additional Social History:    Allergies:   Allergies  Allergen Reactions   Ibuprofen Anaphylaxis   Keppra [Levetiracetam] Anaphylaxis   Wasp Venom Protein Swelling    hives   Nitroglycerin Other (See Comments)    Pt reports "real bad chest pain"    Labs:  Results for orders placed or performed during the hospital encounter of 01/30/22 (from the past 48  hour(s))  CBC     Status: Abnormal   Collection Time: 02/03/22  7:19 PM  Result Value Ref Range   WBC 9.5 4.0 - 10.5 K/uL   RBC 4.64 4.22 - 5.81 MIL/uL   Hemoglobin 13.4 13.0 - 17.0 g/dL   HCT 02.7 (L) 25.3 - 66.4 %   MCV 83.6 80.0 - 100.0 fL   MCH 28.9 26.0 - 34.0 pg   MCHC 34.5 30.0 - 36.0 g/dL   RDW 40.3 47.4 - 25.9 %   Platelets 233 150 - 400 K/uL   nRBC 0.0 0.0 - 0.2 %    Comment: Performed at [facility], [address] [place], [place] [address]  Basic metabolic panel     Status: None   Collection Time: 02/03/22  7:19 PM  Result Value Ref Range   Sodium 140 135 - 145 mmol/L   Potassium 4.3 3.5 - 5.1 mmol/L   Chloride 106 98 - 111 mmol/L   CO2 24 22 - 32 mmol/L   Glucose, Bld 88 70 - 99 mg/dL    Comment: Glucose reference range applies only to samples taken after fasting for at least 8 hours.   BUN 15 6 - 20 mg/dL   Creatinine, Ser 5.64 0.61 - 1.24 mg/dL   Calcium 9.4 8.9 - 33.2 mg/dL   GFR, Estimated >95 >18 mL/min    Comment: (NOTE) Calculated using the CKD-EPI Creatinine Equation (2021)    Anion gap 10 5 - 15    Comment: Performed at [facility], [address] [place], [place] [address]  Ammonia     Status: Abnormal   Collection Time: 02/03/22  7:19 PM  Result Value Ref Range   Ammonia 52 (H) 9 - 35 umol/L    Comment: Performed at [facility], [address] [place], [place] [address]  Vitamin B12     Status: None   Collection Time: 02/03/22  7:19 PM  Result Value Ref Range   Vitamin B-12 611 180 - 914 pg/mL    Comment: (NOTE) This assay is not validated for testing neonatal or myeloproliferative syndrome specimens for Vitamin B12 levels. Performed at [facility], [address] [place], [place] [address]   Folate     Status: None   Collection Time: 02/03/22  7:19 PM  Result Value Ref Range   Folate 30.5 >5.9 ng/mL    Comment: RESULTS CONFIRMED BY MANUAL  DILUTION Performed at [facility], [address], [place], [place] [address]   TSH     Status: Abnormal   Collection Time: 02/03/22  7:19 PM  Result Value Ref Range   TSH 4.700 (H) 0.350 - 4.500 uIU/mL    Comment: Performed by a 3rd Generation assay with a functional sensitivity of <=0.01 uIU/mL. Performed at [facility], [address], [place], [place] [address]   Creatinine, serum  Status: None   Collection Time: 02/04/22  5:54 AM  Result Value Ref Range   Creatinine, Ser 1.01 0.61 - 1.24 mg/dL   GFR, Estimated >87 >68 mL/min    Comment: (NOTE) Calculated using the CKD-EPI Creatinine Equation (2021) Performed at [facility], [address] [place], [place] [address]     Current Facility-Administered Medications  Medication Dose Route Frequency Provider Last Rate Last Admin   acetaminophen (TYLENOL) tablet 650 mg  650 mg Oral Q6H Danford, Earl Lites, MD   650 mg at 02/04/22 1018   bisacodyl (DULCOLAX) EC tablet 5 mg  5 mg Oral BID PRN Charlane Ferretti, DO   5 mg at 01/31/22 2114   enoxaparin (LOVENOX) injection 40 mg  40 mg Subcutaneous QHS Charlane Ferretti, DO   40 mg at 02/03/22 2134   gabapentin (NEURONTIN) capsule 300 mg  300 mg Oral BID Rashaan Wyles, MD       lactated ringers 1,000 mL with potassium chloride 20 mEq infusion   Intravenous Continuous Alberteen Sam, MD 100 mL/hr at 02/04/22 0459 Infusion Verify at 02/04/22 0459   linezolid (ZYVOX) tablet 600 mg  600 mg Oral Q12H Vu, Trung T, MD       LORazepam (ATIVAN) injection 2 mg  2 mg Intravenous Q6H Maryagnes Amos, FNP   2 mg at 02/04/22 1149   multivitamin with minerals tablet 1 tablet  1 tablet Oral Daily Charlane Ferretti, DO   1 tablet at 02/03/22 0949   nicotine (NICODERM CQ - dosed in mg/24 hours) patch 21 mg  21 mg Transdermal Daily Charlane Ferretti, DO   21 mg at 02/04/22 1021   ondansetron (ZOFRAN) tablet 4 mg  4 mg Oral Q6H PRN Charlane Ferretti, DO   4 mg at 01/31/22 1711   Or   ondansetron (ZOFRAN) injection 4 mg  4 mg Intravenous Q6H PRN Charlane Ferretti, DO   4 mg at 01/31/22 1037   Oral care mouth rinse  15 mL Mouth Rinse PRN Danford, Earl Lites, MD       polyethylene glycol (MIRALAX / GLYCOLAX) packet 17 g  17 g Oral Daily PRN Charlane Ferretti, DO   17 g at 01/30/22 2022   thiamine tablet 100 mg  100 mg Oral Daily Charlane Ferretti, DO   100 mg at 02/03/22 6203    Musculoskeletal: Strength & Muscle Tone: within normal limits Gait & Station: normal Patient leans: N/A   Psychiatric Specialty Exam:  Presentation  General Appearance: Bizarre; Disheveled (improved after ativan, appropriate for environment)  Eye Contact:None; Other (comment) (improved after ativan, eyes open. fixed stare resolved)  Speech:loud  Speech Volume:Decreased  Handedness:Right   Mood and Affect  Mood:Anxious; agitated  Affect:Depressed   Thought Process  Thought Processes:Disorganized; Irrevelant  Descriptions of Associations:Loose  Orientation:Full (Time, Place and Person)  Thought Content:Logical  History of Schizophrenia/Schizoaffective disorder:No data recorded Duration of Psychotic Symptoms:No data recorded Hallucinations:Hallucinations: None  Ideas of Reference:denies  Suicidal Thoughts:Suicidal Thoughts: No  Homicidal Thoughts:Homicidal Thoughts: No   Sensorium  Memory:Immediate Fair; Recent Fair; Remote Fair  Judgment:Poor  Insight:Lacking   Executive Functions  Concentration:Poor  Attention Span:Poor  Recall:Poor  Fund of Knowledge:Poor  Language:Poor   Psychomotor Activity  Psychomotor Activity:Psychomotor Activity: Normal   Assets  Assets:Desire for Improvement; Manufacturing systems engineer; Leisure Time; Physical Health; Financial Resources/Insurance; Social Support   Sleep  Sleep:Sleep: Poor   Physical Exam: Physical Exam Vitals and nursing note reviewed.  Constitutional:  Appearance: He is normal weight.  Psychiatric:        Attention and Perception: He is inattentive.        Mood and Affect: Mood is anxious.        Behavior: Behavior is uncooperative, slowed and withdrawn.        Thought Content: Thought content is paranoid and delusional. Thought content does not include suicidal ideation.        Cognition and Memory: Memory is impaired.        Judgment: Judgment is impulsive and inappropriate.    Review of Systems  Psychiatric/Behavioral:  Positive for substance abuse. Negative for depression and suicidal ideas. The patient is nervous/anxious. The patient does not have insomnia.   All other systems reviewed and are negative.  Blood pressure 98/65, pulse 66, temperature 99.5 F (37.5 C), temperature source Oral, resp. rate 19, height 6\' 2"  (1.88 m), weight 74.8 kg, SpO2 100 %. Body mass index is 21.18 kg/m.  Treatment Plan Summary: Daily contact with patient to assess and evaluate symptoms and progress in treatment, Medication management, and Plan Psychiatry will continue to follow.  -Positive therapeutic effect to Ativan challenge.  -Change  Ativan 2mg  IV Q6hr  to Q8hr x 24 hrs prn for agitation/anxiety/seizure -Discontinue zyprexa 2.5mg  po qhs, will increase as tolerated.  -Consider Gabapentin 300 mg BID for agitation, mood swings, pain and anxiety -Patient remains voluntary at this time, in the event patient attempts to leave he does meet criteria for involuntary commitment.  -Will start safety sitter, for continuous observation.  Suspect patient may become floridly psychotic once catatonia resolves.  Psychosis may be worse at nighttime.  Disposition: Recommend psychiatric Inpatient admission when medically cleared.  Corena Pilgrim, MD 02/04/2022 2:28 PM

## 2022-02-04 NOTE — Progress Notes (Signed)
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

## 2022-02-04 NOTE — Progress Notes (Signed)
Pharmacy Antibiotic Note  Stephen Shepard is a 31 y.o. male admitted on 01/30/2022 with  +blood cultures + r/o meninitis .  Pharmacy was consulted for Vancomycin dosing.  Today, vancomycin continues per ID recommendations given concerns for superficial thrombophlebitis.  ID: r/o bacteremia and meningitis with significant MS changes 7/5. Fever curve is trending down, leukocytosis has resolved.  Neurology and Psychiatry consulted  7/3 cefepime x 1 7/3 Vanc >> 7/3 zosyn >> 7/4  7/3: UCx: 3,000 Staph epi with unremarkable UA and no reported GU symptoms 7/3: BCID: Bcx 1/7 Staph epi w/ mecA. - likely contamination 7/3: Fungus BC: NGTD 7/3 repeat Bcx: NGTD 7/6 MRSA PCR: not detected  Previous Cx results 12/15/21 hand abscess: abundant strep intermedius, few H.parainfluenzae, abundant prevotella denticola  Dose adjustments 7/4: - Vanc 1750mg  x 1 then 1g q12 - eAUC 463 using SCr 1.23, TBW, Vd 0.72 7/5: Incr Vanco to 1250mg /12 hr with AUC 488 and Scr 1.13  -serum creatinine WNL/stable and no other nephrotoxic agents on board  Plan: Continue vancomycin 1250 mg IV every 12 hours Monitor clinical progress, renal function, vancomycin levels as indicated F/U C&S / LOT   Height: 6\' 2"  (188 cm) Weight: 74.8 kg (165 lb) IBW/kg (Calculated) : 82.2  Temp (24hrs), Avg:99 F (37.2 C), Min:98 F (36.7 C), Max:99.6 F (37.6 C)  Recent Labs  Lab 01/30/22 0627 01/30/22 0827 01/31/22 0728 02/01/22 0504 02/02/22 0535 02/03/22 1919 02/04/22 0554  WBC 13.8*  --  11.1* 11.0* 10.3 9.5  --   CREATININE 1.23  --  1.31* 1.13 0.98 1.08 1.01  LATICACIDVEN 2.4* 2.3*  --   --   --   --   --      Estimated Creatinine Clearance: 111.1 mL/min (by C-G formula based on SCr of 1.01 mg/dL).    Allergies  Allergen Reactions   Ibuprofen Anaphylaxis   Keppra [Levetiracetam] Anaphylaxis   Wasp Venom Protein Swelling    hives   Nitroglycerin Other (See Comments)    Pt reports "real bad chest pain"    Thank  you for allowing pharmacy to be a part of this patient's care.  04/05/22, PharmD, BCPS Clinical Pharmacist [place] Please utilize Amion for appropriate phone number to reach the unit pharmacist [facility]) 02/04/2022 11:35 AM

## 2022-02-05 DIAGNOSIS — F419 Anxiety disorder, unspecified: Secondary | ICD-10-CM | POA: Diagnosis not present

## 2022-02-05 DIAGNOSIS — B182 Chronic viral hepatitis C: Secondary | ICD-10-CM | POA: Diagnosis not present

## 2022-02-05 DIAGNOSIS — F1990 Other psychoactive substance use, unspecified, uncomplicated: Secondary | ICD-10-CM | POA: Diagnosis not present

## 2022-02-05 DIAGNOSIS — F061 Catatonic disorder due to known physiological condition: Secondary | ICD-10-CM | POA: Diagnosis not present

## 2022-02-05 LAB — CREATININE, SERUM
Creatinine, Ser: 1.07 mg/dL (ref 0.61–1.24)
GFR, Estimated: >60 mL/min (ref >60)

## 2022-02-05 MED ORDER — NICOTINE POLACRILEX 2 MG MT GUM
2.0000 mg | CHEWING_GUM | OROMUCOSAL | Status: DC | PRN
  Administered 2022-02-05: 2 mg via ORAL
  Filled 2022-02-05 (×3): qty 1

## 2022-02-05 MED ORDER — LORAZEPAM 1 MG PO TABS: 2.0000 mg | ORAL_TABLET | Freq: Two times a day (BID) | ORAL | Status: DC | PRN

## 2022-02-05 NOTE — Progress Notes (Signed)
   02/05/22 0805  Charting Type  Focused Reassessment No Changes Neurological  Neurological  Neuro (WDL) X  Orientation Level Oriented to person;Oriented to place;Oriented to time;Oriented to situation  Cognition Follows commands;Appropriate judgement  Speech Clear  R Pupil Size (mm) 3  R Pupil Shape Round  R Pupil Reaction Brisk  L Pupil Size (mm) 3  L Pupil Shape Round  L Pupil Reaction Brisk  Motor Function/Sensation Assessment Grip;Dorsiflexion;Plantar flexion  Facial Symmetry Symmetrical  R Hand Grip Moderate  L Hand Grip Moderate  R Foot Dorsiflexion Moderate  L Foot Dorsiflexion Moderate  R Foot Plantar Flexion Moderate  L Foot Plantar Flexion Moderate  Neurological  Level of Consciousness Alert   Sinclair Ship, RN

## 2022-02-05 NOTE — TOC Progression Note (Deleted)
Transition of Care [facility]) - Progression Note    Patient Details  Name: Stephen Shepard MRN: [id number] Date of Birth: [birth year]  [facility]) CM/SW Contact  Darleene Cleaver, [place] Phone Number: 02/05/2022, 5:09 PM  Clinical Narrative:     CSW continuing to follow patient's progress throughout discharge planning.  CSW following in case patient needs to be admitted under IVC due to behavioral health problems.  At this time patient claims to be voluntarily looking to admit to behavioral health inpatient   Expected Discharge Plan: Home/Self Care Barriers to Discharge: Continued Medical Work up  Expected Discharge Plan and Services Expected Discharge Plan: Home/Self Care   Discharge Planning Services: CM Consult                     Social Determinants of Health (SDOH)  Hettie Holstein  Readmission Risk Interventions     No data to display

## 2022-02-05 NOTE — Progress Notes (Signed)
Progress Note   Patient: Stephen Shepard TFT:[id number] DOB: Dec 11, 1990 DOA: 01/30/2022     5 DOS: the patient was seen and examined on 02/05/2022 at 11:25 AM      Brief hospital course: Mr. Humbarger is a 31 y.o. M with hx IVDU, recent admission for hand tenosynovitis and candida fungemia who presented with seizure like activity.  The patient provides inconsistent versions of the history to different providers, but conservatively, it appears he may have had a period of loss of consciousness and uncontrolled shaking while waking up.  In the ER, MRI brain normal.  He was in withdrawal and was started on Suboxone and admitted for seizure eval.     7/3: Admitted and started on Suboxone, MRI brain normal 7/4: Fevered again, blood cultures with 1/3 positive for S epi, EEG normal 7/5: Mental status change, CTH normal, refused LP 7/6: ID and Neuro consulted; appeared to have positive response to Ativan trial 7/7: With staff, patient disorganized and paranoid, In the afternoon, patient lucid and oriented with significant other, [name] 7/8: Mental status improving but still residual paranoia is marked --> Ativan stopped, Gabapentin started 7/9: Mental status improving, still some paranoia, better overall; Linezolid completed     Assessment and Plan: * Seizure-like activity (HCC) This was the initial presenting complaint.    However, MRI normal. EEG normal. No GTC seizures in the hospital.  Repeat EEG again normal after change in MS. I agree with neuro, there is no role for AEDs at this time.    No further siezures     Fever and positive blood culture The patient was discharged from the hospital 2 months ago with a probucol hand infection and with Candida fungemia.  He completed all the 3 days of both treatments in the hospital, and I think even if he did not adhere to the treatment regimens for both last time, it is unlikely he has residual fungemia.  Presented here with fever, cultures this  admission with 1 of 3 cultures growing Staph epidermidis and Staph capitis and urine with 5K cfus S epi as well.  The hand was evaluated and is definitely not infected.  He has no other joint pains or skin lesions that could be a focus of infection.  CXR clear and CT abdomen and pelvis without focal findings on admission.  Attempted LP but patient refused. Meningitis doubted.  He was evaluated by ID who felt likely his fever was not infectious, but recmomended completing 7 days antibiotics with Linezolid, to complete tonight Sunday.    - If he had repeat fever, would need repeat cultures and TTE       Catatonia and psychosis On admission, patient had odd affect, but was oriented, appropriate and interactive.   On 7/5, HD#3, he had a new change which primarily appeared to be a rigid catatonia.  MRI brain on admission normal.  Repeat CT head 7/5 normal. Refused LP.  Prior EEG normal.  Repeat EEG normal.    On 7/6 given a test dose of IV Ativan and had marked animation, became interactive, just psychotic and disorganized.    Psychiatry were consulted who intiially scheduled Ativan, which made the patient appear more psychotic, and so Ativan was stopped 7/8 and he was transitioned to gabapentin.   - Consult Psychiatry - Patient is currently voluntarily in treatment, and Psychiatry recommend at this time inpatient treatment after discharge; likely to be medically stable for discharge tomorrow, and Psychiatry will determine tomorrow if he can be discharged  or should undergo IVC for inpatient psychiatric treatment      Opioid withdrawal [facility] Patient initially was experiencing symptoms of opiate withdrawal and was started on Suboxone.  When he subsequently became catatonic this was stoppped, and as of today he has no active symptoms of withdrawal.    Suboxone would be a reasonable risk reduction treatment for opiate dependence at the time of discharge.  Patient has been counseled to find a  substance use treatment center that can prescribe Suboxone after discharge    Opioid use disorder, severe, dependence (HCC) - TOC consult for resources handout  Malnutrition of moderate degree    Chronic hepatitis C without hepatic coma [facility]) Patient has [facility] appt on 7/11   Tobacco abuse    Anxiety Not currently on treatment other than hydroxyzine  Prediabetes    IVDU (intravenous drug user) - Consult TOC for assistance with Suboxone references so patient can make an appointment after discharge          Subjective: Patient has no pain, no fever, no respiratory symptoms, no abdominal pain.  Appetite is good.  He continues to express some paranoid ideations.  He is very reluctant to stay 1 more day     Physical Exam: Vitals:   02/04/22 1602 02/04/22 2047 02/05/22 0531 02/05/22 1426  BP: (!) 89/57 106/69 127/74 113/66  Pulse: 82 63 65 74  Resp: 18 18 16 18   Temp: 98.5 F (36.9 C) (!) 97.5 F (36.4 C) 98.7 F (37.1 C) 98.9 F (37.2 C)  TempSrc: Oral Oral Oral Oral  SpO2: 100% 100% 100% 100%  Weight:      Height:       Thin adult male, sitting on the edge of the bed, makes eye contact, interactive Tachycardic, regular, no murmurs Respiratory rate normal, lungs clear without rales or wheezes Skin is warm and dry without suspicious rashes on the face, neck, back, upper extremities, torso, or lower legs. The right arm is wrapped, but there is no redness or discomfort. Attention is distracted, affect is irritable and somewhat paranoid, he is oriented to person, place, and time, but seems somewhat hyperactive, and expresses occasional paranoid ideation Face symmetric, speech fluent, moves all extremities with generalized weakness but symmetric strength  Data Reviewed: Normal creatinine  Family Communication: Partner [name] at the bedside    Disposition: Status is: Inpatient The patient was admitted for seizure-like activity.  This is resolved  He was  evaluated by infectious disease for ongoing fever, they recommended completing 7 days of antibiotic which will finish today.  Tomorrow the patient should be medically cleared, provided he is ambulatory with physical therapy.  At that point psychiatry will need to decide whether he needs inpatient psychiatric treatment or can be discharged tomorrow            Author: , MD 02/05/2022 4:34 PM  For on call review [email or web address].

## 2022-02-05 NOTE — TOC Progression Note (Signed)
Transition of Care [facility]) - Progression Note    Patient Details  Name: Stephen Shepard MRN: [id number] Date of Birth: 11-21-1990  [facility] CM/SW Contact  Darleene Cleaver, [place] Phone Number: 02/05/2022, 5:18 PM  Clinical Narrative:     CSW continuing to follow patient's progress throughout discharge planning.  CSW following in case patient needs to be admitted under IVC due to behavioral health problems.  At this time patient claims to be voluntarily looking to admit to behavioral health inpatient    Expected Discharge Plan: Home/Self Care Barriers to Discharge: Continued Medical Work up  Expected Discharge Plan and Services Expected Discharge Plan: Home/Self Care   Discharge Planning Services: CM Consult                                           Social Determinants of Health (SDOH) Interventions    Readmission Risk Interventions     No data to display

## 2022-02-06 ENCOUNTER — Encounter (HOSPITAL_COMMUNITY): Payer: Self-pay | Admitting: Family Medicine

## 2022-02-06 DIAGNOSIS — B182 Chronic viral hepatitis C: Secondary | ICD-10-CM | POA: Diagnosis not present

## 2022-02-06 DIAGNOSIS — F419 Anxiety disorder, unspecified: Secondary | ICD-10-CM | POA: Diagnosis not present

## 2022-02-06 DIAGNOSIS — F061 Catatonic disorder due to known physiological condition: Secondary | ICD-10-CM | POA: Diagnosis not present

## 2022-02-06 DIAGNOSIS — R569 Unspecified convulsions: Secondary | ICD-10-CM | POA: Diagnosis not present

## 2022-02-06 DIAGNOSIS — Z720 Tobacco use: Secondary | ICD-10-CM

## 2022-02-06 LAB — FUNGUS CULTURE, BLOOD: Culture: NO GROWTH

## 2022-02-06 LAB — CREATININE, SERUM
Creatinine, Ser: 1.16 mg/dL (ref 0.61–1.24)
GFR, Estimated: >60 mL/min (ref >60)

## 2022-02-06 MED ORDER — NICOTINE POLACRILEX 2 MG MT GUM: 2.0000 mg | CHEWING_GUM | OROMUCOSAL | 0 refills | Status: DC | PRN

## 2022-02-06 MED ORDER — GABAPENTIN 300 MG PO CAPS: 300.0000 mg | ORAL_CAPSULE | Freq: Two times a day (BID) | ORAL | 1 refills | Status: DC

## 2022-02-06 MED ORDER — NICOTINE 21 MG/24HR TD PT24: 21.0000 mg | MEDICATED_PATCH | Freq: Every day | TRANSDERMAL | 0 refills | Status: DC

## 2022-02-06 MED ORDER — HYDROXYZINE HCL 25 MG PO TABS: 25.0000 mg | ORAL_TABLET | Freq: Four times a day (QID) | ORAL | 1 refills | Status: DC | PRN

## 2022-02-06 NOTE — Progress Notes (Signed)
Patient has been read and explained discharge instructions. Patient has no further questions at this time. IV of the right arm has been removed. Site is clean, dry and intact.  Ajia Chadderdon, RN 

## 2022-02-06 NOTE — Progress Notes (Signed)
Physical Therapy Treatment Patient Details Name: Stephen Shepard MRN: [id number] DOB: 09-09-1990 Today's Date: 02/06/2022   History of Present Illness Patient is a 31 year old male who presented to the hosptial with seizure like activity. patient was admitted with Acute metabolic encephalopathy and rigidity,Opioid withdrawal, fever with positive blood culture.   PMH: seizure, IV heroin drug use,Sepsis secondary to abscess of multiple sites of right upper extremity and Candida glabrata fungemia, Hep C,    PT Comments    Pt doing very well today. Goals met. Pt is overall IND. Ready for d/c with assist prn from PT standpoint.   Recommendations for follow up therapy are one component of a multi-disciplinary discharge planning process, led by the attending physician.  Recommendations may be updated based on patient status, additional functional criteria and insurance authorization.  Follow Up Recommendations  No PT follow up     Assistance Recommended at Discharge PRN  Patient can return home with the following     Equipment Recommendations  None recommended by PT    Recommendations for Other Services       Precautions / Restrictions Precautions Precaution Comments: seizures Restrictions Weight Bearing Restrictions: No     Mobility  Bed Mobility               General bed mobility comments: sitting on window seat    Transfers Overall transfer level: Independent                      Ambulation/Gait Ambulation/Gait assistance: Independent Gait Distance (Feet): 160 Feet Assistive device: None Gait Pattern/deviations: Drifts right/left       General Gait Details: slight drifting, pt without overt LOB   Stairs             Wheelchair Mobility    Modified Rankin (Stroke Patients Only)       Balance                             High level balance activites: Direction changes, Turns, Head turns High Level Balance Comments: no LOB with  above            Cognition Arousal/Alertness: Awake/alert Behavior During Therapy: WFL for tasks assessed/performed Overall Cognitive Status: Within Functional Limits for tasks assessed                                          Exercises      General Comments        Pertinent Vitals/Pain Pain Assessment Pain Assessment: No/denies pain Faces Pain Scale: No hurt    Home Living                          Prior Function            PT Goals (current goals can now be found in the care plan section) Acute Rehab PT Goals Patient Stated Goal: pt unable to state PT Goal Formulation: Patient unable to participate in goal setting Time For Goal Achievement: 02/16/22 Potential to Achieve Goals: Good Progress towards PT goals: Goals met/education completed, patient discharged from PT    Frequency    Min 2X/week      PT Plan Discharge plan needs to be updated    Co-evaluation  AM-PAC PT "6 Clicks" Mobility   Outcome Measure  Help needed turning from your back to your side while in a flat bed without using bedrails?: None Help needed moving from lying on your back to sitting on the side of a flat bed without using bedrails?: None Help needed moving to and from a bed to a chair (including a wheelchair)?: None Help needed standing up from a chair using your arms (e.g., wheelchair or bedside chair)?: None Help needed to walk in hospital room?: None Help needed climbing 3-5 steps with a railing? : None 6 Click Score: 24    End of Session   Activity Tolerance: Patient tolerated treatment well Patient left: with call bell/phone within reach;with family/visitor present;Other (comment) (window seat) Nurse Communication: Mobility status PT Visit Diagnosis: Other abnormalities of gait and mobility (R26.89);Other symptoms and signs involving the nervous system (R29.898)     Time: 2951-8841 PT Time Calculation (min) (ACUTE ONLY):  10 min  Charges:  $Gait Training: 8-22 mins                     Baxter Flattery, PT  Acute Rehab Dept [facility]) [phone]  [facility] Weekend Pager [facility] only)  [phone]  02/06/2022    [facility] 02/06/2022, 2:41 PM

## 2022-02-06 NOTE — Progress Notes (Addendum)
[facility] Infectious Diseases Follow Up Note  Patient Identification: Patient Name: Stephen Shepard MRN: [id number] Admit Date: 01/30/2022  6:13 AM Age: 31 y.o.Today's Date: 02/06/2022   Reason for Visit: fevers   Principal Problem:   Seizure-like activity (HCC) Active Problems:   IVDU (intravenous drug user)   Prediabetes   Anxiety   Tobacco abuse   Chronic hepatitis C without hepatic coma (HCC)   Malnutrition of moderate degree   Fever and positive blood culture   Opioid use disorder, severe, dependence (HCC)   Opioid withdrawal (HCC)   Catatonia and psychosis  Antibiotics:  Vancomycin 7/3-7/8, Linezolid 7/8-c Zosyn 7/3 Cefepime 7/3   Lines/Hardware:  Interval Events: remains afebrile, no leukocytosis  Assessment #  Fevers, unclear cause, resolved  - Venous duplex 7/6 age indeterminate superficial vein thrombosis  involving  the right cephalic vein and right basilic vein//age indeterminate superficial vein thrombosis  involving  the left basilic vein. - 7/3 blood cx NG ( final) - 7/3 fungal blood cx NG ( final)  # Catatonia - Psychiatry consulted, on gabapentin, seems to be improving  #  Recent candida glabrata fungemia   # Staph epidermidis/staph hominis in 1 sets from multiple sets : likely contamination   # Urine cx with MRSE with unremarkable UA and no reported GU symptoms  # Hepatitis C - fu OP for tx # IVDU/Opioid use d/o   Recommendations Complete 7 days of IV Vancomycin and PO linezolid and stop thereafter Fu with [facility] for HCV tx has been rescheduled.  ID will sign off. Please call with questions   Rest of the management as per the primary team. Thank you for the consult. Please page with pertinent questions or concerns.  ______________________________________________________________________ Subjective patient seen and examined at the bedside.  No complaints   Vitals BP 119/71 (BP Location: Left  Arm)   Pulse 74   Temp 98.6 F (37 C) (Oral)   Resp 20   Ht 6\' 2"  (1.88 m)   Wt 74.8 kg   SpO2 100%   BMI 21.18 kg/m     Physical Exam Constitutional: sitting up in the chair and comfortable     Comments:   Cardiovascular:     Rate and Rhythm: Normal rate and regular rhythm.     Heart sounds:  Pulmonary:     Effort: Pulmonary effort is normal.     Comments:   Abdominal:     Palpations: Abdomen is distended    Tenderness:   Musculoskeletal:        General: No swelling or tenderness.   Skin:    Comments:   Neurological:     General: awake, alert and oriented and following commands appropriately   Pertinent Microbiology Results for orders placed or performed during the hospital encounter of 01/30/22  Blood Culture (routine x 2)     Status: None   Collection Time: 01/30/22  6:27 AM   Specimen: BLOOD  Result Value Ref Range Status   Specimen Description   Final    BLOOD BLOOD LEFT FOREARM Performed at [facility], [address] [place], [place]    Special Requests   Final    BOTTLES DRAWN AEROBIC AND ANAEROBIC Blood Culture adequate volume Performed at [facility], [address], [place], [place]    Culture   Final    NO GROWTH 5 DAYS Performed at [facility], [address] [place], [place]    Report  Status 02/04/2022 FINAL  Final  Urine Culture     Status: Abnormal   Collection Time: 01/30/22  6:27 AM   Specimen: In/Out Cath Urine  Result Value Ref Range Status   Specimen Description   Final    IN/OUT CATH URINE Performed at [facility], [address] [place], [place] [address]    Special Requests   Final    NONE Performed at [facility], [address] [place], [place] [address]    Culture 3,000 COLONIES/mL STAPHYLOCOCCUS EPIDERMIDIS (A)  Final   Report Status 02/01/2022 FINAL  Final   Organism ID, Bacteria STAPHYLOCOCCUS  EPIDERMIDIS (A)  Final      Susceptibility   Staphylococcus epidermidis - MIC*    CIPROFLOXACIN 1 SENSITIVE Sensitive     GENTAMICIN 2 SENSITIVE Sensitive     NITROFURANTOIN <=16 SENSITIVE Sensitive     OXACILLIN >=4 RESISTANT Resistant     TETRACYCLINE >=16 RESISTANT Resistant     VANCOMYCIN 2 SENSITIVE Sensitive     TRIMETH/SULFA >=320 RESISTANT Resistant     CLINDAMYCIN >=8 RESISTANT Resistant     RIFAMPIN <=0.5 SENSITIVE Sensitive     Inducible Clindamycin NEGATIVE Sensitive     * 3,000 COLONIES/mL STAPHYLOCOCCUS EPIDERMIDIS  Culture, blood (Routine X 2) w Reflex to ID Panel     Status: Abnormal   Collection Time: 01/30/22 11:56 AM   Specimen: BLOOD RIGHT FOREARM  Result Value Ref Range Status   Specimen Description   Final    BLOOD RIGHT FOREARM Performed at [facility], [address], [place], [place] [address]    Special Requests   Final    BOTTLES DRAWN AEROBIC AND ANAEROBIC Blood Culture adequate volume Performed at [facility], [address], [place], [place] [address]    Culture  Setup Time   Final    GRAM POSITIVE COCCI IN BOTH AEROBIC AND ANAEROBIC BOTTLES CRITICAL RESULT CALLED TO, READ BACK BY AND VERIFIED WITH: PHARMD A.TECKER AT 5035 ON 01/31/2022 BY T.SAAD.    Culture (A)  Final    STAPHYLOCOCCUS EPIDERMIDIS STAPHYLOCOCCUS CAPITIS THE SIGNIFICANCE OF ISOLATING THIS ORGANISM FROM A SINGLE VENIPUNCTURE CANNOT BE PREDICTED WITHOUT FURTHER CLINICAL AND CULTURE CORRELATION. SUSCEPTIBILITIES AVAILABLE ONLY ON REQUEST. Performed at [facility], [address], [place], [place] [address]    Report Status 02/03/2022 FINAL  Final  Blood Culture ID Panel (Reflexed)     Status: Abnormal   Collection Time: 01/30/22 11:56 AM  Result Value Ref Range Status   Enterococcus faecalis NOT DETECTED NOT DETECTED Final   Enterococcus Faecium NOT DETECTED NOT DETECTED Final   Listeria monocytogenes NOT DETECTED NOT DETECTED Final    Staphylococcus species DETECTED (A) NOT DETECTED Final    Comment: CRITICAL RESULT CALLED TO, READ BACK BY AND VERIFIED WITH: PHARMD A.TECKER AT 1275 ON 01/31/2022 BY T.SAAD.    Staphylococcus aureus (BCID) NOT DETECTED NOT DETECTED Final   Staphylococcus epidermidis DETECTED (A) NOT DETECTED Final    Comment: Methicillin (oxacillin) resistant coagulase negative staphylococcus. Possible blood culture contaminant (unless isolated from more than one blood culture draw or clinical case suggests pathogenicity). No antibiotic treatment is indicated for blood  culture contaminants. CRITICAL RESULT CALLED TO, READ BACK BY AND VERIFIED WITH: PHARMD A.TECKER AT 1700 ON 01/31/2022 BY T.SAAD.    Staphylococcus lugdunensis NOT DETECTED NOT DETECTED Final   Streptococcus species NOT DETECTED NOT DETECTED Final   Streptococcus agalactiae NOT DETECTED NOT DETECTED Final  Streptococcus pneumoniae NOT DETECTED NOT DETECTED Final   Streptococcus pyogenes NOT DETECTED NOT DETECTED Final   A.calcoaceticus-baumannii NOT DETECTED NOT DETECTED Final   Bacteroides fragilis NOT DETECTED NOT DETECTED Final   Enterobacterales NOT DETECTED NOT DETECTED Final   Enterobacter cloacae complex NOT DETECTED NOT DETECTED Final   Escherichia coli NOT DETECTED NOT DETECTED Final   Klebsiella aerogenes NOT DETECTED NOT DETECTED Final   Klebsiella oxytoca NOT DETECTED NOT DETECTED Final   Klebsiella pneumoniae NOT DETECTED NOT DETECTED Final   Proteus species NOT DETECTED NOT DETECTED Final   Salmonella species NOT DETECTED NOT DETECTED Final   Serratia marcescens NOT DETECTED NOT DETECTED Final   Haemophilus influenzae NOT DETECTED NOT DETECTED Final   Neisseria meningitidis NOT DETECTED NOT DETECTED Final   Pseudomonas aeruginosa NOT DETECTED NOT DETECTED Final   Stenotrophomonas maltophilia NOT DETECTED NOT DETECTED Final   Candida albicans NOT DETECTED NOT DETECTED Final   Candida auris NOT DETECTED NOT DETECTED Final    Candida glabrata NOT DETECTED NOT DETECTED Final   Candida krusei NOT DETECTED NOT DETECTED Final   Candida parapsilosis NOT DETECTED NOT DETECTED Final   Candida tropicalis NOT DETECTED NOT DETECTED Final   Cryptococcus neoformans/gattii NOT DETECTED NOT DETECTED Final   Methicillin resistance mecA/C DETECTED (A) NOT DETECTED Final    Comment: CRITICAL RESULT CALLED TO, READ BACK BY AND VERIFIED WITH: PHARMD A.TECKER AT 5993 ON 01/31/2022 BY T.SAAD. Performed at [facility], [address], [place], [place] [address]   SARS Coronavirus 2 by RT PCR (hospital order, performed in [facility] hospital lab) *cepheid single result test* Anterior Nasal Swab     Status: None   Collection Time: 01/30/22 12:10 PM   Specimen: Anterior Nasal Swab  Result Value Ref Range Status   SARS Coronavirus 2 by RT PCR NEGATIVE NEGATIVE Final    Comment: (NOTE) SARS-CoV-2 target nucleic acids are NOT DETECTED.  The SARS-CoV-2 RNA is generally detectable in upper and lower respiratory specimens during the acute phase of infection. The lowest concentration of SARS-CoV-2 viral copies this assay can detect is 250 copies / mL. A negative result does not preclude SARS-CoV-2 infection and should not be used as the sole basis for treatment or other patient management decisions.  A negative result may occur with improper specimen collection / handling, submission of specimen other than nasopharyngeal swab, presence of viral mutation(s) within the areas targeted by this assay, and inadequate number of viral copies (<250 copies / mL). A negative result must be combined with clinical observations, patient history, and epidemiological information.  Fact Sheet for Patients:   RoadLapTop.co.za  Fact Sheet for Healthcare Providers: http://kim-miller.com/  This test is not yet approved or  cleared by the [place] FDA and has been authorized for detection and/or  diagnosis of SARS-CoV-2 by FDA under an Emergency Use Authorization (EUA).  This EUA will remain in effect (meaning this test can be used) for the duration of the COVID-19 declaration under Section 564(b)(1) of the Act, 21 U.S.C. section 360bbb-3(b)(1), unless the authorization is terminated or revoked sooner.  Performed at [facility], [address], [place], [place] [address]   Fungus culture, blood     Status: None   Collection Time: 01/30/22  2:00 PM   Specimen: BLOOD  Result Value Ref Range Status   Specimen Description   Final    BLOOD FUN Performed at [facility], [address] [place], [place] [address]  Special Requests   Final    BOTTLES DRAWN AEROBIC AND ANAEROBIC Blood Culture results may not be optimal due to an excessive volume of blood received in culture bottles Performed at [facility], [address], [place], [place] [address]    Culture   Final    NO GROWTH 7 DAYS NO FUNGUS ISOLATED Performed at [facility], [address], [place], [place] [address]    Report Status 02/06/2022 FINAL  Final  Culture, blood (Routine X 2) w Reflex to ID Panel     Status: None   Collection Time: 01/30/22  5:53 PM   Specimen: BLOOD  Result Value Ref Range Status   Specimen Description   Final    BLOOD RIGHT ANTECUBITAL Performed at [facility], [address], [place], [place] [address]    Special Requests   Final    BOTTLES DRAWN AEROBIC ONLY Blood Culture adequate volume Performed at [facility], [address], [place], [place] [address]    Culture   Final    NO GROWTH 5 DAYS Performed at [facility], [address], [place], [place] [address]    Report Status 02/04/2022 FINAL  Final  MRSA Next Gen by PCR, Nasal     Status: None   Collection Time: 02/02/22 12:54 PM   Specimen: Nasal Mucosa; Nasal Swab  Result Value Ref Range Status   MRSA by PCR Next  Gen NOT DETECTED NOT DETECTED Final    Comment: (NOTE) The GeneXpert MRSA Assay (FDA approved for NASAL specimens only), is one component of a comprehensive MRSA colonization surveillance program. It is not intended to diagnose MRSA infection nor to guide or monitor treatment for MRSA infections. Test performance is not FDA approved in patients less than 69 years old. Performed at [facility], [address], [place], [place] [address]     Pertinent Lab.    Latest Ref Rng & Units 02/03/2022    7:19 PM 02/02/2022    5:35 AM 02/01/2022    5:04 AM  CBC  WBC 4.0 - 10.5 K/uL 9.5  10.3  11.0   Hemoglobin 13.0 - 17.0 g/dL 55.7  32.2  02.5   Hematocrit 39.0 - 52.0 % 38.8  41.3  44.2   Platelets 150 - 400 K/uL 233  243  239       Latest Ref Rng & Units 02/06/2022    5:14 AM 02/05/2022    5:28 AM 02/04/2022    5:54 AM  CMP  Creatinine 0.61 - 1.24 mg/dL 4.27  0.62  3.76      Pertinent Imaging today Plain films and CT images have been personally visualized and interpreted; radiology reports have been reviewed. Decision making incorporated into the Impression / Recommendations.  No results found.  I spent 40 minutes for this patient encounter including review of prior medical records, coordination of care with primary/other specialist with greater than 50% of time being face to face/counseling and discussing diagnostics/treatment plan with the patient/family.  Electronically signed by:   Odette Fraction, MD Infectious Disease Physician [facility] for Infectious Disease Pager: [phone]

## 2022-02-06 NOTE — Discharge Summary (Addendum)
Physician Discharge Summary   Patient: Steven Basso MRN: [id number] DOB: [birth year]  Admit date:     01/30/2022  Discharge date: 02/06/22  Discharge Physician: Clydie Braun   PCP: Patient, [name]   Recommendations at discharge:   Patient was advised to establish care with a primary care provider with his insurance.  Discharge Diagnoses: Active Problems:   Catatonia and psychosis   IVDU (intravenous drug user)   Prediabetes   Anxiety   Tobacco abuse   Chronic hepatitis C without hepatic coma (HCC)   Malnutrition of moderate degree   Opioid use disorder, severe, dependence (HCC)   Opioid withdrawal (HCC)  Principal Problem (Resolved):   Seizure-like activity (HCC) Resolved Problems:   Fever and positive blood culture  Hospital Course: Mr. Pine is a 31 y.o. M with hx IVDU, recent admission for hand tenosynovitis and candida fungemia who presented with seizure like activity.  The patient provides inconsistent versions of the history to different providers, but conservatively, it appears he may have had a period of loss of consciousness and uncontrolled shaking while waking up.  In the ER, MRI brain normal.  He was in withdrawal and was started on Suboxone and admitted for seizure eval.     7/3: Admitted and started on Suboxone, MRI brain normal 7/4: Fevered again, blood cultures with 1/3 positive for S epi, EEG normal 7/5: Mental status change, CTH normal, refused LP 7/6: ID and Neuro consulted; appeared to have positive response to Ativan trial 7/7: With staff, patient disorganized and paranoid, In the afternoon, patient lucid and oriented with significant other, [name] 7/8: Mental status improving but still residual paranoia is marked --> Ativan stopped, Gabapentin started 7/9: Mental status improving, still some paranoia, better overall; Linezolid completed  Assessment and Plan: * Seizure-like activity (HCC) This was the initial presenting complaint.     However, MRI normal. EEG normal. No GTC seizures in the hospital.  Repeat EEG again normal after change in MS. I agree with neuro, there is no role for AEDs at this time as symptoms were thought to be drug-induced.    No further siezures reported  Fever    Fever and positive blood culture The patient was discharged from the hospital 2 months ago with a probucol hand infection and with Candida fungemia.  He completed all the 3 days of both treatments in the hospital, and I think even if he did not adhere to the treatment regimens for both last time, it is unlikely he has residual fungemia.  Presented here with fever, cultures this admission with 1 of 3 cultures growing Staph epidermidis and Staph capitis and urine with 5K cfus S epi as well.  The hand was evaluated and is definitely not infected.  He has no other joint pains or skin lesions that could be a focus of infection.  CXR clear and CT abdomen and pelvis without focal findings on admission.  Attempted LP but patient refused. Meningitis doubted.  He was evaluated by ID who felt likely his fever was not infectious, but recmomended completing 7 days antibiotics with Linezolid completed 7/11 - If he had repeat fever, would need repeat cultures and TTE.  No fevers documented.  Catatonia and psychosis On admission, patient had odd affect, but was oriented, appropriate and interactive.   On 7/5, HD#3, he had a new change which primarily appeared to be a rigid catatonia.  MRI brain on admission normal.  Repeat CT head 7/5 normal. Refused LP.  Prior EEG  normal.  Repeat EEG normal.    On 7/6 given a test dose of IV Ativan and had marked animation, became interactive, just psychotic and disorganized.    Psychiatry were consulted who intiially scheduled Ativan, which made the patient appear more psychotic, and so Ativan was stopped 7/8 and he was transitioned to gabapentin.   - Consult Psychiatry - Patient is currently voluntarily in  treatment, and Psychiatry reevaluated on 7/10 and recommended patient was stable for discharge home and did not warrant inpatient psychiatry hospitalization  Opioid withdrawal (HCC) Patient initially was experiencing symptoms of opiate withdrawal and was started on Suboxone.  When he subsequently became catatonic this was stoppped, and as of today he has no active symptoms of withdrawal.    Suboxone would be a reasonable risk reduction treatment for opiate dependence at the time of discharge.  Patient has been counseled to find a substance use treatment center that can prescribe Suboxone after discharge    Opioid use disorder, severe, dependence (HCC) - TOC consult for resources handout  Malnutrition of moderate degree    Chronic hepatitis C without hepatic coma [facility]) Patient has [facility] appt on 7/11   Tobacco abuse  Nicotine patch and gum offered  Anxiety Not currently on treatment other than hydroxyzine  Prediabetes    IVDU (intravenous drug user) - Consult TOC for assistance with Suboxone references so patient can make an appointment after discharge - Continue to encourage patient to avoid use of illicit substances        Pain control - [facility] Controlled Substance Reporting System database was reviewed. and patient was instructed, not to drive, operate heavy machinery, perform activities at heights, swimming or participation in water activities or provide baby-sitting services while on Pain, Sleep and Anxiety Medications; until their outpatient Physician has advised to do so again. Also recommended to not to take more than prescribed Pain, Sleep and Anxiety Medications.  Consultants: Neurology, infectious disease, and psychiatry Procedures performed: EEG Disposition: Home Diet recommendation:  Discharge Diet Orders (From admission, onward)     Start     Ordered   02/06/22 0000  Diet general        02/06/22 1423            DISCHARGE MEDICATION: Allergies as  of 02/06/2022       Reactions   Ibuprofen Anaphylaxis   Keppra [levetiracetam] Anaphylaxis   Wasp Venom Protein Swelling   hives   Nitroglycerin Other (See Comments)   Pt reports "real bad chest pain"        Medication List     STOP taking these medications    lidocaine 5 % Commonly known as: Lidoderm   multivitamin with minerals Tabs tablet   thiamine 100 MG tablet       TAKE these medications    acetaminophen 500 MG tablet Commonly known as: TYLENOL Take 500 mg by mouth every 6 (six) hours as needed for moderate pain.   gabapentin 300 MG capsule Commonly known as: NEURONTIN Take 1 capsule (300 mg total) by mouth 2 (two) times daily.   hydrOXYzine 25 MG tablet Commonly known as: ATARAX Take 1 tablet (25 mg total) by mouth every 6 (six) hours as needed for anxiety.   nicotine 21 mg/24hr patch Commonly known as: NICODERM CQ - dosed in mg/24 hours Place 1 patch (21 mg total) onto the skin daily.   nicotine polacrilex 2 MG gum Commonly known as: NICORETTE Take 1 each (2 mg total) by mouth as  needed for smoking cessation.               Discharge Care Instructions  (From admission, onward)           Start     Ordered   02/06/22 0000  Discharge wound care:       Comments: Continue routine wound care   02/06/22 1423            Follow-up Information     [facility] Follow up.   Why: Call to schedule appointment for suboxone clinic. Contact information: [address] [place] [address] [phone]         [facility] Follow up.   Why: Call to schedule appointment for suboxone maintenance Contact information: [address] [place] [address] [phone]         [facility] Follow up.   Why: Call to schedule appointment for Suboxone maintenance Contact information: [address]  [place], [place] [address]      Telephone: [phone];         [phone]        [facility]. Schedule an appointment as soon as possible for a visit.   Why: Call to set up a hospital follow up appointment. Contact information: [address] [place] [address] [phone]         [facility]. Schedule an appointment as soon as possible for a visit.   Why: Call to set up a hospital follow up appointment if you prefer this location. Contact information: [address] [place] [address] [phone]               Subjective: Patient states he is doing well this morning and has no complaints.  He was looking at videos on check CPK and states that its been hallucinating and so it cannot be trusted.  Discharge Exam: Filed Weights   01/30/22 0620  Weight: 74.8 kg   Thin adult male sitting at the bench Good eye contact and interactive Normal respiratory effort with out significant wheezes or rhonchi Regular rate and rhythm no significant tachycardia appreciated Right wrist wrapped Skin warm and dry lesion. Alert and oriented to person, place, time.  Patient does seem paranoid and states that chat GPT hallucinating  and can not be trusted Condition at discharge: stable  The results of significant diagnostics from this hospitalization (including imaging, microbiology, ancillary and laboratory) are listed below for reference.   Imaging Studies: EEG adult  Result Date: 2022/02/21 Charlsie Quest, MD     02-21-22  8:34 AM Patient Name: Halvor Behrend MRN: [id number] Epilepsy Attending: Charlsie Quest Referring Physician/Provider: Alberteen Sam, MD Date: 02/02/2022 Duration: 26.20 mins Patient history: 31 year old presenting with episode of decreased responsiveness, eye flutter.  EEG to evaluate for seizure. Level of alertness: Awake AEDs during EEG study: Ativan Technical aspects: This EEG study was done with scalp electrodes  positioned according to the 10-20 International system of electrode placement. Electrical activity was acquired at a sampling rate of  and reviewed with a high frequency filter of  and a low frequency filter of . EEG data were recorded continuously and digitally stored. Description: The posterior dominant rhythm consists of 8 Hz activity of moderate voltage (25-35 uV) seen predominantly in posterior head regions, symmetric and reactive to eye opening and eye closing.  There is also frontocentral 15 to 18 Hz beta  activity. Hyperventilation and photic stimulation were not performed.   IMPRESSION: This study is within normal limits. No seizures or epileptiform discharges were seen throughout the recording. Priyanka O Yadav   VAS [place] UPPER EXTREMITY VENOUS DUPLEX  Result Date: 02/02/2022 UPPER VENOUS STUDY  Patient Name:  DAVELLE ANSELMI  Date of Exam:   02/02/2022 Medical Rec #: [id number]     Accession #:    [id number] Date of Birth: [birth year]     Patient Gender: M Patient Age:   59 years Exam Location:  [facility] Procedure:      VAS [place] UPPER EXTREMITY VENOUS DUPLEX Referring Phys: Odette Fraction --------------------------------------------------------------------------------  Indications: "fevers of unclear cause" Limitations: Patient rigidity (won't move arms away from body, very stiff. Comparison Study: No previous studies Performing Technologist: Jody Hill RVT, RDMS  Examination Guidelines: A complete evaluation includes B-mode imaging, spectral Doppler, color Doppler, and power Doppler as needed of all accessible portions of each vessel. Bilateral testing is considered an integral part of a complete examination. Limited examinations for reoccurring indications may be performed as noted.  Right Findings: +----------+------------+---------+-----------+----------+-----------------+ RIGHT     CompressiblePhasicitySpontaneousProperties     Summary       +----------+------------+---------+-----------+----------+-----------------+ IJV           Full       Yes       Yes                                +----------+------------+---------+-----------+----------+-----------------+ Subclavian    Full       Yes       Yes                                +----------+------------+---------+-----------+----------+-----------------+ Axillary                                             Not visualized   +----------+------------+---------+-----------+----------+-----------------+ Brachial      Full       Yes       Yes                                +----------+------------+---------+-----------+----------+-----------------+ Radial        Full                                                    +----------+------------+---------+-----------+----------+-----------------+ Ulnar         Full                                                    +----------+------------+---------+-----------+----------+-----------------+ Cephalic      None       No        No               Age Indeterminate +----------+------------+---------+-----------+----------+-----------------+ Basilic     Partial      No  No               Age Indeterminate +----------+------------+---------+-----------+----------+-----------------+  Left Findings: +----------+------------+---------+-----------+----------+-------------------+ LEFT      CompressiblePhasicitySpontaneousProperties      Summary       +----------+------------+---------+-----------+----------+-------------------+ IJV           Full       Yes       Yes                                  +----------+------------+---------+-----------+----------+-------------------+ Subclavian    Full       Yes       Yes                                  +----------+------------+---------+-----------+----------+-------------------+ Axillary      Full                                  not well  visualized +----------+------------+---------+-----------+----------+-------------------+ Brachial      Full       Yes       Yes                                  +----------+------------+---------+-----------+----------+-------------------+ Radial        Full                                                      +----------+------------+---------+-----------+----------+-------------------+ Ulnar         Full                                                      +----------+------------+---------+-----------+----------+-------------------+ Cephalic      None       No        No                Age Indeterminate  +----------+------------+---------+-----------+----------+-------------------+ Basilic                                               Not visualized    +----------+------------+---------+-----------+----------+-------------------+  Summary:  Right: 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 However, unable to visualize the axillary.  Left: Findings consistent with age indeterminate superficial vein thrombosis involving the left basilic vein. However, unable to visualize the basilic.  *See table(s) above for measurements and observations.  Diagnosing physician: Heath Lark Electronically signed by Heath Lark on 02/02/2022 at 5:50:29 PM.    Final    VAS [place] LOWER EXTREMITY VENOUS (DVT)  Result Date: 02/02/2022  Lower Venous DVT Study Patient Name:  CAUY MELODY  Date of Exam:   02/02/2022 Medical Rec #: [id number]     Accession #:    [id number] Date of Birth: 1991/03/31  Patient Gender: M Patient Age:   [age] years [facility] Procedure:      VAS US LOWER EXTREMITY VENOUS (DVT) Referring Phys: Odette FractionSABINA MANANDHAR --------------------------------------------------------------------------------  Indications: "fever of unclear cause".  Comparison Study: No previous exams  Performing Technologist: Jody Hill RVT, RDMS  Examination Guidelines: A complete evaluation includes B-mode imaging, spectral Doppler, color Doppler, and power Doppler as needed of all accessible portions of each vessel. Bilateral testing is considered an integral part of a complete examination. Limited examinations for reoccurring indications may be performed as noted. The reflux portion of the exam is performed with the patient in reverse Trendelenburg.  +---------+---------------+---------+-----------+----------+--------------+ RIGHT    CompressibilityPhasicitySpontaneityPropertiesThrombus Aging +---------+---------------+---------+-----------+----------+--------------+ CFV      Full           Yes      Yes                                 +---------+---------------+---------+-----------+----------+--------------+ SFJ      Full                                                        +---------+---------------+---------+-----------+----------+--------------+ FV Prox  Full           Yes      Yes                                 +---------+---------------+---------+-----------+----------+--------------+ FV Mid   Full           Yes      Yes                                 +---------+---------------+---------+-----------+----------+--------------+ FV DistalFull           Yes      Yes                                 +---------+---------------+---------+-----------+----------+--------------+ PFV      Full                                                        +---------+---------------+---------+-----------+----------+--------------+ POP      Full           Yes      Yes                                 +---------+---------------+---------+-----------+----------+--------------+ PTV      Full                                                        +---------+---------------+---------+-----------+----------+--------------+ PERO     Full                                                         +---------+---------------+---------+-----------+----------+--------------+   +---------+---------------+---------+-----------+----------+--------------+  LEFT     CompressibilityPhasicitySpontaneityPropertiesThrombus Aging +---------+---------------+---------+-----------+----------+--------------+ CFV      Full           Yes      Yes                                 +---------+---------------+---------+-----------+----------+--------------+ SFJ      Full                                                        +---------+---------------+---------+-----------+----------+--------------+ FV Prox  Full           Yes      Yes                                 +---------+---------------+---------+-----------+----------+--------------+ FV Mid   Full           Yes      Yes                                 +---------+---------------+---------+-----------+----------+--------------+ FV DistalFull           Yes      Yes                                 +---------+---------------+---------+-----------+----------+--------------+ PFV      Full                                                        +---------+---------------+---------+-----------+----------+--------------+ POP      Full           Yes      Yes                                 +---------+---------------+---------+-----------+----------+--------------+ PTV      Full                                                        +---------+---------------+---------+-----------+----------+--------------+ PERO     Full                                                        +---------+---------------+---------+-----------+----------+--------------+     Summary: BILATERAL: - No evidence of deep vein thrombosis seen in the lower extremities, bilaterally. -No evidence of popliteal cyst, bilaterally.   *See table(s) above for measurements and observations. Electronically signed by Heath Lark on 02/02/2022 at 5:48:25 PM.    Final    DG FL GUIDED LUMBAR PUNCTURE  Result Date: 02/01/2022 CLINICAL DATA:  Mental status  change. EXAM: DIAGNOSTIC LUMBAR PUNCTURE UNDER FLUOROSCOPIC GUIDANCE COMPARISON:  Brain MRI 12/31/2021 FLUOROSCOPY: Radiation Exposure Index (as provided by the fluoroscopic device): 3.10 mGy PROCEDURE: Informed consent was obtained from the patient prior to the procedure, including potential complications of headache, allergy, and pain. However, before proceeding with the actual lumbar puncture the patient adamantly refused. IMPRESSION: The patient refused the procedure. Electronically Signed   By: Rudie Meyer M.D.   On: 02/01/2022 12:36   CT HEAD WO CONTRAST ( )  Result Date: 02/01/2022 CLINICAL DATA:  Mental status change, unknown cause EXAM: CT HEAD WITHOUT CONTRAST TECHNIQUE: Contiguous axial images were obtained from the base of the skull through the vertex without intravenous contrast. RADIATION DOSE REDUCTION: This exam was performed according to the departmental dose-optimization program which includes automated exposure control, adjustment of the mA and/or kV according to patient size and/or use of iterative reconstruction technique. COMPARISON:  CT head 01/30/2022. FINDINGS: Brain: No evidence of acute infarction, hemorrhage, hydrocephalus, extra-axial collection or mass lesion/mass effect. Vascular: No hyperdense vessel identified. Skull: No acute fracture. Sinuses/Orbits: Mild paranasal sinus mucosal thickening. No acute orbital findings. Other: No mastoid effusions. IMPRESSION: No evidence of acute intracranial abnormality. Electronically Signed   By: Feliberto Harts M.D.   On: 02/01/2022 11:17   CT ABDOMEN PELVIS W CONTRAST  Result Date: 01/30/2022 CLINICAL DATA:  Abdominal pain. Recent hospitalization for infection. History of constipation. EXAM: CT ABDOMEN AND PELVIS WITH CONTRAST TECHNIQUE: Multidetector CT imaging of the abdomen and pelvis was performed  using the standard protocol following bolus administration of intravenous contrast. RADIATION DOSE REDUCTION: This exam was performed according to the departmental dose-optimization program which includes automated exposure control, adjustment of the mA and/or kV according to patient size and/or use of iterative reconstruction technique. CONTRAST:  OMNIPAQUE IOHEXOL 300 MG/ML  SOLN COMPARISON:  CT examination dated June 14, 2019 FINDINGS: Lower chest: No acute abnormality. Elevation of the right hemidiaphragm with right basilar atelectasis. Hepatobiliary: No focal liver abnormality is seen. No gallstones, gallbladder wall thickening, or biliary dilatation. Pancreas: Unremarkable. No pancreatic ductal dilatation or surrounding inflammatory changes. Spleen: Normal in size without focal abnormality. Adrenals/Urinary Tract: Adrenal glands are unremarkable. Kidneys are normal, without renal calculi, focal lesion, or hydronephrosis. Bladder is unremarkable. Stomach/Bowel: Stomach is within normal limits. Appendix appears normal. Large amount of retained colonic stool suggesting constipation. Vascular/Lymphatic: No significant vascular findings are present. No enlarged abdominal or pelvic lymph nodes. Reproductive: Prostate is unremarkable. Other: No abdominal wall hernia or abnormality. No abdominopelvic ascites. Musculoskeletal: No acute or significant osseous findings. IMPRESSION: 1.  No CT evidence of acute abdominal/pelvic process. 2.  Large amount of retained colonic stool suggesting constipation. 3.  Normal appendix.  No evidence of colitis or diverticulitis. 4.  No evidence of nephrolithiasis or hydronephrosis. Electronically Signed   By: Larose Hires D.O.   On: 01/30/2022 16:14   MR BRAIN WO CONTRAST  Result Date: 01/30/2022 CLINICAL DATA:  Seizure, new-onset, no history of trauma EXAM: MRI HEAD WITHOUT CONTRAST TECHNIQUE: Multiplanar, multiecho pulse sequences of the brain and surrounding structures  were obtained without intravenous contrast. COMPARISON:  CT head same day. FINDINGS: Motion limited study. Brain: No acute infarction, hemorrhage, hydrocephalus, extra-axial collection or mass lesion. Vascular: Major arterial flow voids are maintained at the skull base. Skull and upper cervical spine: Normal marrow signal. Sinuses/Orbits: Mild paranasal sinus mucosal thickening. No acute orbital findings. Other: No mastoid effusions. IMPRESSION: Normal brain MRI. No evidence of acute intracranial abnormality. Motion limited. Electronically Signed  By: Feliberto Harts M.D.   On: 01/30/2022 11:53   EEG adult  Result Date: 01/30/2022 Charlsie Quest, MD     01/31/2022  8:10 AM Patient Name: Aundray Cartlidge MRN: [id number] Epilepsy Attending: Charlsie Quest Referring Physician/Provider: Virgina Norfolk, DO Date: 01/30/2022 Duration: 21.59 mins Patient history: 31 year old male with seizure-like activity.  EEG to evaluate for seizure. Level of alertness: Awake AEDs during EEG study: None Technical aspects: This EEG study was done with scalp electrodes positioned according to the 10-20 International system of electrode placement. Electrical activity was acquired at a sampling rate of  and reviewed with a high frequency filter of  and a low frequency filter of . EEG data were recorded continuously and digitally stored. Description: The posterior dominant rhythm consists of 7 Hz activity of moderate voltage (25-35 uV) seen predominantly in posterior head regions, symmetric and reactive to eye opening and eye closing. Hyperventilation and photic stimulation were not performed.   ABNORMALITY - Background slow IMPRESSION: This study is suggestive of mild diffuse encephalopathy, nonspecific etiology. No seizures or epileptiform discharges were seen throughout the recording. Charlsie Quest   CT Cervical Spine Wo Contrast  Result Date: 01/30/2022 CLINICAL DATA:  Neck trauma EXAM: CT CERVICAL SPINE WITHOUT  CONTRAST TECHNIQUE: Multidetector CT imaging of the cervical spine was performed without intravenous contrast. Multiplanar CT image reconstructions were also generated. RADIATION DOSE REDUCTION: This exam was performed according to the departmental dose-optimization program which includes automated exposure control, adjustment of the mA and/or kV according to patient size and/or use of iterative reconstruction technique. COMPARISON:  None Available. FINDINGS: Alignment: Normal. Skull base and vertebrae: No acute fracture. No primary bone lesion or focal pathologic process. Soft tissues and spinal canal: No prevertebral fluid or swelling. No visible canal hematoma. Disc levels:  Well-maintained. Upper chest: Negative. Other: None. IMPRESSION: No evidence of acute cervical spine fracture or traumatic malalignment. Electronically Signed   By: Allegra Lai M.D.   On: 01/30/2022 11:06   CT Head Wo Contrast  Result Date: 01/30/2022 CLINICAL DATA:  seizure like activity, h/o fungemia EXAM: CT HEAD WITHOUT CONTRAST TECHNIQUE: Contiguous axial images were obtained from the base of the skull through the vertex without intravenous contrast. RADIATION DOSE REDUCTION: This exam was performed according to the departmental dose-optimization program which includes automated exposure control, adjustment of the mA and/or kV according to patient size and/or use of iterative reconstruction technique. COMPARISON:  2015 FINDINGS: Brain: There is no acute intracranial hemorrhage, mass effect, or edema. Gray-white differentiation is preserved. There is no extra-axial fluid collection. Ventricles and sulci are within normal limits in size and configuration. Vascular: No hyperdense vessel or unexpected calcification. Skull: Calvarium is unremarkable. Sinuses/Orbits: No acute finding. Other: None. IMPRESSION: No acute intracranial abnormality. Electronically Signed   By: Guadlupe Spanish M.D.   On: 01/30/2022 08:10   DG Hand Complete  Right  Result Date: 01/30/2022 CLINICAL DATA:  31 year old male with pain. Skin wound with scab across metacarpals. EXAM: RIGHT HAND - COMPLETE 3+ VIEW COMPARISON:  Right hand and wrist series 12/14/2021. FINDINGS: Bone mineralization is within normal limits. There is no evidence of fracture or dislocation. There is no evidence of arthropathy or other focal bone abnormality. Soft tissue swelling has regressed since May. There is residual dorsal soft tissue irregularity. No radiopaque foreign body identified. No soft tissue gas identified. IMPRESSION: 1. Regressed soft tissue swelling since May with residual dorsal soft tissue irregularity. 2. No acute osseous abnormality identified in the right  hand. Electronically Signed   By: Odessa Fleming M.D.   On: 01/30/2022 07:56   DG Chest Port 1 View  Result Date: 01/30/2022 CLINICAL DATA:  Questionable sepsis. History of IV heroin drug abuse. EXAM: PORTABLE CHEST 1 VIEW COMPARISON:  10/15/2021 FINDINGS: The heart size and mediastinal contours are within normal limits. Both lungs are clear. The visualized skeletal structures are unremarkable. IMPRESSION: No active disease. Electronically Signed   By: Signa Kell M.D.   On: 01/30/2022 07:26    Microbiology: Results for orders placed or performed during the hospital encounter of 01/30/22  Blood Culture (routine x 2)     Status: None   Collection Time: 01/30/22  6:27 AM   Specimen: BLOOD  Result Value Ref Range Status   Specimen Description   Final    BLOOD BLOOD LEFT FOREARM Performed at [facility], [address] [place], [place] [address]    Special Requests   Final    BOTTLES DRAWN AEROBIC AND ANAEROBIC Blood Culture adequate volume Performed at [facility], [address] [place], [place] [address]    Culture   Final    NO GROWTH 5 DAYS Performed at [facility], [address], [place], [place] [address]    Report Status 02/04/2022 FINAL  Final  Urine  Culture     Status: Abnormal   Collection Time: 01/30/22  6:27 AM   Specimen: In/Out Cath Urine  Result Value Ref Range Status   Specimen Description   Final    IN/OUT CATH URINE Performed at [facility], [address], [place], [place] [address]    Special Requests   Final    NONE Performed at [facility], [address], [place], [place] [address]    Culture 3,000 COLONIES/mL STAPHYLOCOCCUS EPIDERMIDIS (A)  Final   Report Status 02/01/2022 FINAL  Final   Organism ID, Bacteria STAPHYLOCOCCUS EPIDERMIDIS (A)  Final      Susceptibility   Staphylococcus epidermidis - MIC*    CIPROFLOXACIN 1 SENSITIVE Sensitive     GENTAMICIN 2 SENSITIVE Sensitive     NITROFURANTOIN <=16 SENSITIVE Sensitive     OXACILLIN >=4 RESISTANT Resistant     TETRACYCLINE >=16 RESISTANT Resistant     VANCOMYCIN 2 SENSITIVE Sensitive     TRIMETH/SULFA >=320 RESISTANT Resistant     CLINDAMYCIN >=8 RESISTANT Resistant     RIFAMPIN <=0.5 SENSITIVE Sensitive     Inducible Clindamycin NEGATIVE Sensitive     * 3,000 COLONIES/mL STAPHYLOCOCCUS EPIDERMIDIS  Culture, blood (Routine X 2) w Reflex to ID Panel     Status: Abnormal   Collection Time: 01/30/22 11:56 AM   Specimen: BLOOD RIGHT FOREARM  Result Value Ref Range Status   Specimen Description   Final    BLOOD RIGHT FOREARM Performed at [facility], [address], [place], [place] [address]    Special Requests   Final    BOTTLES DRAWN AEROBIC AND ANAEROBIC Blood Culture adequate volume Performed at [facility], [address], [place], [place] [address]    Culture  Setup Time   Final    GRAM POSITIVE COCCI IN BOTH AEROBIC AND ANAEROBIC BOTTLES CRITICAL RESULT CALLED TO, READ BACK BY AND VERIFIED WITH: PHARMD A.TECKER AT 9629 ON 01/31/2022 BY T.SAAD.    Culture (A)  Final    STAPHYLOCOCCUS EPIDERMIDIS STAPHYLOCOCCUS CAPITIS THE SIGNIFICANCE OF ISOLATING THIS ORGANISM FROM A SINGLE  VENIPUNCTURE CANNOT BE PREDICTED WITHOUT  FURTHER CLINICAL AND CULTURE CORRELATION. SUSCEPTIBILITIES AVAILABLE ONLY ON REQUEST. Performed at [facility], [address] [place], [place] [address]    Report Status 02/03/2022 FINAL  Final  Blood Culture ID Panel (Reflexed)     Status: Abnormal   Collection Time: 01/30/22 11:56 AM  Result Value Ref Range Status   Enterococcus faecalis NOT DETECTED NOT DETECTED Final   Enterococcus Faecium NOT DETECTED NOT DETECTED Final   Listeria monocytogenes NOT DETECTED NOT DETECTED Final   Staphylococcus species DETECTED (A) NOT DETECTED Final    Comment: CRITICAL RESULT CALLED TO, READ BACK BY AND VERIFIED WITH: PHARMD A.TECKER AT 7564 ON 01/31/2022 BY T.SAAD.    Staphylococcus aureus (BCID) NOT DETECTED NOT DETECTED Final   Staphylococcus epidermidis DETECTED (A) NOT DETECTED Final    Comment: Methicillin (oxacillin) resistant coagulase negative staphylococcus. Possible blood culture contaminant (unless isolated from more than one blood culture draw or clinical case suggests pathogenicity). No antibiotic treatment is indicated for blood  culture contaminants. CRITICAL RESULT CALLED TO, READ BACK BY AND VERIFIED WITH: PHARMD A.TECKER AT 3329 ON 01/31/2022 BY T.SAAD.    Staphylococcus lugdunensis NOT DETECTED NOT DETECTED Final   Streptococcus species NOT DETECTED NOT DETECTED Final   Streptococcus agalactiae NOT DETECTED NOT DETECTED Final   Streptococcus pneumoniae NOT DETECTED NOT DETECTED Final   Streptococcus pyogenes NOT DETECTED NOT DETECTED Final   A.calcoaceticus-baumannii NOT DETECTED NOT DETECTED Final   Bacteroides fragilis NOT DETECTED NOT DETECTED Final   Enterobacterales NOT DETECTED NOT DETECTED Final   Enterobacter cloacae complex NOT DETECTED NOT DETECTED Final   Escherichia coli NOT DETECTED NOT DETECTED Final   Klebsiella aerogenes NOT DETECTED NOT DETECTED Final   Klebsiella oxytoca NOT DETECTED NOT DETECTED Final    Klebsiella pneumoniae NOT DETECTED NOT DETECTED Final   Proteus species NOT DETECTED NOT DETECTED Final   Salmonella species NOT DETECTED NOT DETECTED Final   Serratia marcescens NOT DETECTED NOT DETECTED Final   Haemophilus influenzae NOT DETECTED NOT DETECTED Final   Neisseria meningitidis NOT DETECTED NOT DETECTED Final   Pseudomonas aeruginosa NOT DETECTED NOT DETECTED Final   Stenotrophomonas maltophilia NOT DETECTED NOT DETECTED Final   Candida albicans NOT DETECTED NOT DETECTED Final   Candida auris NOT DETECTED NOT DETECTED Final   Candida glabrata NOT DETECTED NOT DETECTED Final   Candida krusei NOT DETECTED NOT DETECTED Final   Candida parapsilosis NOT DETECTED NOT DETECTED Final   Candida tropicalis NOT DETECTED NOT DETECTED Final   Cryptococcus neoformans/gattii NOT DETECTED NOT DETECTED Final   Methicillin resistance mecA/C DETECTED (A) NOT DETECTED Final    Comment: CRITICAL RESULT CALLED TO, READ BACK BY AND VERIFIED WITH: PHARMD A.TECKER AT 5188 ON 01/31/2022 BY T.SAAD. Performed at [facility], [address], [place], [place] [address]   SARS Coronavirus 2 by RT PCR (hospital order, performed in [facility] hospital lab) *cepheid single result test* Anterior Nasal Swab     Status: None   Collection Time: 01/30/22 12:10 PM   Specimen: Anterior Nasal Swab  Result Value Ref Range Status   SARS Coronavirus 2 by RT PCR NEGATIVE NEGATIVE Final    Comment: (NOTE) SARS-CoV-2 target nucleic acids are NOT DETECTED.  The SARS-CoV-2 RNA is generally detectable in upper and lower respiratory specimens during the acute phase of infection. The lowest concentration of SARS-CoV-2 viral copies this assay can detect is 250 copies / mL. A negative result does not preclude SARS-CoV-2 infection and should not be used as  the sole basis for treatment or other patient management decisions.  A negative result may occur with improper specimen collection / handling, submission of  specimen other than nasopharyngeal swab, presence of viral mutation(s) within the areas targeted by this assay, and inadequate number of viral copies (<250 copies / mL). A negative result must be combined with clinical observations, patient history, and epidemiological information.  Fact Sheet for Patients:   RoadLapTop.co.za  Fact Sheet for Healthcare Providers: http://kim-miller.com/  This test is not yet approved or  cleared by the [place] FDA and has been authorized for detection and/or diagnosis of SARS-CoV-2 by FDA under an Emergency Use Authorization (EUA).  This EUA will remain in effect (meaning this test can be used) for the duration of the COVID-19 declaration under Section 564(b)(1) of the Act, 21 U.S.C. section 360bbb-3(b)(1), unless the authorization is terminated or revoked sooner.  Performed at [facility], [address] [place], [place] [address]   Fungus culture, blood     Status: None   Collection Time: 01/30/22  2:00 PM   Specimen: BLOOD  Result Value Ref Range Status   Specimen Description   Final    BLOOD FUN Performed at [facility], [address], [place], [place] [address]    Special Requests   Final    BOTTLES DRAWN AEROBIC AND ANAEROBIC Blood Culture results may not be optimal due to an excessive volume of blood received in culture bottles Performed at [facility], [address], [place], [place] [address]    Culture   Final    NO GROWTH 7 DAYS NO FUNGUS ISOLATED Performed at [facility], [address] [place], [place] [address]    Report Status 02/06/2022 FINAL  Final  Culture, blood (Routine X 2) w Reflex to ID Panel     Status: None   Collection Time: 01/30/22  5:53 PM   Specimen: BLOOD  Result Value Ref Range Status   Specimen Description   Final    BLOOD RIGHT ANTECUBITAL Performed at [facility],  [address], [place], [place] [address]    Special Requests   Final    BOTTLES DRAWN AEROBIC ONLY Blood Culture adequate volume Performed at [facility], [address] [place], [place] [address]    Culture   Final    NO GROWTH 5 DAYS Performed at [facility], [address] [place], [place] [address]    Report Status 02/04/2022 FINAL  Final  MRSA Next Gen by PCR, Nasal     Status: None   Collection Time: 02/02/22 12:54 PM   Specimen: Nasal Mucosa; Nasal Swab  Result Value Ref Range Status   MRSA by PCR Next Gen NOT DETECTED NOT DETECTED Final    Comment: (NOTE) The GeneXpert MRSA Assay (FDA approved for NASAL specimens only), is one component of a comprehensive MRSA colonization surveillance program. It is not intended to diagnose MRSA infection nor to guide or monitor treatment for MRSA infections. Test performance is not FDA approved in patients less than 60 years old. Performed at [facility], [address], [place], [place] [address]     Labs: CBC: Recent Labs  Lab 01/31/22 0728 02/01/22 0504 02/02/22 0535 02/03/22 1919  WBC 11.1* 11.0* 10.3 9.5  HGB 14.2 15.1 14.1 13.4  HCT 41.8 44.2 41.3 38.8*  MCV 83.3 83.1 84.1 83.6  PLT 248 239 243 233   Basic Metabolic  Panel: Recent Labs  Lab 01/31/22 0728 02/01/22 0504 02/02/22 0535 02/03/22 1919 02/04/22 0554 02/05/22 0528 02/06/22 0514  NA 141 142 141 140  --   --   --   K 3.9 3.8 3.8 4.3  --   --   --   CL 105 107 107 106  --   --   --   CO2 --   --   --   GLUCOSE 112* 109* 89 88  --   --   --   BUN 20 23* 20 15  --   --   --   CREATININE 1.31* 1.13 0.98 1.08 1.01 1.07 1.16  CALCIUM 9.4 9.6 9.2 9.4  --   --   --    Liver Function Tests: Recent Labs  Lab 01/31/22 0728 02/01/22 0504  AST 39 50*  ALT 40 40  ALKPHOS 75 70  BILITOT 1.0 1.1  PROT 8.5* 8.9*  ALBUMIN 4.2 4.4   CBG: No results for input(s): "GLUCAP" in the last 168  hours.  Discharge time spent: More than 30 minutes.  Signed: Clydie Braun, MD [facility] 02/06/2022

## 2022-02-06 NOTE — Progress Notes (Signed)
   02/06/22 0802  Charting Type  Focused Reassessment No Changes Neurological  Neurological  Neuro (WDL) X  Orientation Level Oriented X4  Cognition Appropriate attention/concentration;Appropriate safety awareness;Follows commands  Speech Clear  R Pupil Size (mm) 3  R Pupil Shape Round  R Pupil Reaction Brisk  L Pupil Size (mm) 3  L Pupil Shape Round  L Pupil Reaction Brisk  Motor Function/Sensation Assessment Grip;Dorsiflexion;Plantar flexion  Facial Symmetry Symmetrical  R Hand Grip Strong  L Hand Grip Strong  R Foot Dorsiflexion Strong  L Foot Dorsiflexion Strong  R Foot Plantar Flexion Strong  L Foot Plantar Flexion Strong  Neurological  Level of Consciousness Alert   Sinclair Ship, RN

## 2022-02-06 NOTE — TOC Transition Note (Signed)
Transition of Care [facility]) - CM/SW Discharge Note   Patient Details  Name: Stephen Shepard MRN: [id number] Date of Birth: [birth year]  [facility] CM/SW Contact:  Darleene Cleaver, LCSW Phone Number: 02/06/2022, 3:41 PM   Clinical Narrative:     Patient has been cleared by psych and plans to return back home.  CSW has included substance abuse resources on AVS.  Also contact information for [facility] and [facility] for patient to call and get appointment scheduled for hospital follow up.   Final next level of care: Home/Self Care Barriers to Discharge: Barriers Resolved   Patient Goals and CMS Choice Patient states their goals for this hospitalization and ongoing recovery are:: To return back home.      Discharge Placement                       Discharge Plan and Services   Discharge Planning Services: CM Consult                                 Social Determinants of Health (SDOH) Interventions     Readmission Risk Interventions     No data to display

## 2022-02-06 NOTE — Consult Note (Signed)
[facility] Face-to-Face Psychiatry Consult   Reason for Consult:  Catatonia Referring Physician:  Dr. Maryfrances Bunnell Patient Identification: Stephen Shepard MRN:  [id number] Principal Diagnosis: Seizure-like activity (HCC) Diagnosis:  Principal Problem:   Seizure-like activity (HCC) Active Problems:   IVDU (intravenous drug user)   Prediabetes   Anxiety   Tobacco abuse   Chronic hepatitis C without hepatic coma (HCC)   Malnutrition of moderate degree   Fever and positive blood culture   Opioid use disorder, severe, dependence (HCC)   Opioid withdrawal (HCC)   Catatonia and psychosis   Total Time spent with patient: 45 minutes  Subjective:   Stephen Shepard is a 31 y.o. male patient admitted with seizure like activity. Psychiatry consulted for catatonia.    30 year old male is seen and reassessed, today after developing catatonia from acute benzodiazepine withdrawal.  Patient has had 1 similar presentation, 10 years ago which also resulted in hospitalization.  On today's reassessment he does endorse use of multiple illicit street substances to include THC edibles, fentanyl, benzodiazepine, and intermittent use of illicit substances.  Patient admits to " I decided 1 day I was going to come off of everything.  I now know that is not the safe and/or right way to do things.  Those drugs almost killed me.  I am 31 years old, and I need to get my stuff together."  On today's evaluation he denies any physical withdrawal symptoms to include tremors, nausea, vomiting, delusions, psychosis, myalgia, chills, and or confusion.  He further denies any physical complaints related to side effects/adverse reactions from medication.  He denies any suicidal ideations at this time.  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  He has been clean now for approximately 8 days, and currently likes the way he feels physically and  mentally.  He does appear to be motivated to seek complete alcohol cessation, recovery, and rehabilitation.  He is interested in outpatient psychiatric substance abuse resources, citing his need to return to work.  He enjoys his current job, and likes what he does considers this as a distraction from illicit substances and helps to manage his overall anxiety.  Patient does apologize for his behaviors, that he displayed during his times of psychosis.  Will psychiatrically clear at this time.  HPI:  Stephen Shepard is a 31 y.o. male with a pertinent history of seizures as an adolescent, not on antiepileptics, anxiety, asthma, substance abuse history of IV heroin, Hep C, recent admission for multiple right upper extremity abscess, s/p I&D of the right shoulder and right wrist, r multiple right hand compartment tenosynovectomy on 5/18 with Dr. Orlan Leavens and Candida glabrata fungemia presents to the ER via EMS with "15 minutes of shaking". He states he had 2 seizures as a 31 year old but has not had any seizures since and is not on any antiepileptics. He was placed on klonopins he states back then. He currently alert and oriented, but states he feels like he got "hit by a truck" endorsing body aches.  He has not unwrapped his right hand for several weeks and states that he "cannot look at it"  Past Psychiatric History:  Polysubstance abuse. Denies any inpatient psychiatric admission. Denies any suicie attempts.   Risk to Self:   Denies, Limited due to psychosis Risk to Others:  Denies Prior Inpatient Therapy:   Denies Prior Outpatient Therapy:   Denies  Past Medical History:  Past Medical History:  Diagnosis  Date   Anxiety    Asthma    Seizures ([place])    Substance abuse [facility])     Past Surgical History:  Procedure Laterality Date   I & D EXTREMITY Right 12/15/2021   Procedure: IRRIGATION AND DEBRIDEMENT RIGHT HAND AND SHOULDER;  Surgeon: Iran Planas, MD;  Location: [facility] ORS;  Service: Orthopedics;   Laterality: Right;  upper and lower arm   TEE WITHOUT CARDIOVERSION N/A 12/21/2021   Procedure: TRANSESOPHAGEAL ECHOCARDIOGRAM (TEE);  Surgeon: Donato Heinz, MD;  Location: [facility] ENDOSCOPY;  Service: Cardiovascular;  Laterality: N/A;   Family History: No family history on file. Family Psychiatric  History: Denies Social History:  Social History   Substance and Sexual Activity  Alcohol Use Never     Social History   Substance and Sexual Activity  Drug Use Yes   Types: IV    Social History   Socioeconomic History   Marital status: Single    Spouse name: Not on file   Number of children: Not on file   Years of education: Not on file   Highest education level: Not on file  Occupational History   Not on file  Tobacco Use   Smoking status: Every Day    Packs/day: 1.00    Types: Cigarettes   Smokeless tobacco: Never  Vaping Use   Vaping Use: Never used  Substance and Sexual Activity   Alcohol use: Never   Drug use: Yes    Types: IV   Sexual activity: Not Currently  Other Topics Concern   Not on file  Social History Narrative   Not on file   Social Determinants of Health   Financial Resource Strain: Not on file  Food Insecurity: Not on file  Transportation Needs: Not on file  Physical Activity: Not on file  Stress: Not on file  Social Connections: Not on file   Additional Social History:    Allergies:   Allergies  Allergen Reactions   Ibuprofen Anaphylaxis   Keppra [Levetiracetam] Anaphylaxis   Wasp Venom Protein Swelling    hives   Nitroglycerin Other (See Comments)    Pt reports "real bad chest pain"    Labs:  Results for orders placed or performed during the hospital encounter of 01/30/22 (from the past 48 hour(s))  Creatinine, serum     Status: None   Collection Time: 02/05/22  5:28 AM  Result Value Ref Range   Creatinine, Ser 1.07 0.61 - 1.24 mg/dL   GFR, Estimated >60 >60 mL/min    Comment: (NOTE) Calculated using the CKD-EPI Creatinine  Equation (2021) Performed at [facility], [place] [address] [place], [place] [address]   Creatinine, serum     Status: None   Collection Time: 02/06/22  5:14 AM  Result Value Ref Range   Creatinine, Ser 1.16 0.61 - 1.24 mg/dL   GFR, Estimated >60 >60 mL/min    Comment: (NOTE) Calculated using the CKD-EPI Creatinine Equation (2021) Performed at [facility], [place] [address], [place], [place] [address]     Current Facility-Administered Medications  Medication Dose Route Frequency Provider Last Rate Last Admin   acetaminophen (TYLENOL) tablet 650 mg  650 mg Oral Q6H Danford, Suann Larry, MD   650 mg at 02/05/22 2045   bisacodyl (DULCOLAX) EC tablet 5 mg  5 mg Oral BID PRN Sueanne Margarita, DO   5 mg at 01/31/22 2114   enoxaparin (LOVENOX) injection 40 mg  40 mg Subcutaneous QHS Sueanne Margarita, DO  40 mg at 02/03/22 2134   gabapentin (NEURONTIN) capsule 300 mg  300 mg Oral BID Akintayo, Mojeed, MD   300 mg at 02/06/22 1003   LORazepam (ATIVAN) tablet 2 mg  2 mg Oral BID PRN Akintayo, Mojeed, MD       nicotine (NICODERM CQ - dosed in mg/24 hours) patch 21 mg  21 mg Transdermal Daily Charlane Ferretti, DO   21 mg at 02/06/22 1004   nicotine polacrilex (NICORETTE) gum 2 mg  2 mg Oral PRN Alberteen Sam, MD   2 mg at 02/05/22 1203   ondansetron (ZOFRAN) tablet 4 mg  4 mg Oral Q6H PRN Charlane Ferretti, DO   4 mg at 02/04/22 2121   Or   ondansetron (ZOFRAN) injection 4 mg  4 mg Intravenous Q6H PRN Charlane Ferretti, DO   4 mg at 01/31/22 1037   Oral care mouth rinse  15 mL Mouth Rinse PRN Danford, Earl Lites, MD       polyethylene glycol (MIRALAX / GLYCOLAX) packet 17 g  17 g Oral Daily PRN Charlane Ferretti, DO   17 g at 01/30/22 2022    Musculoskeletal: Strength & Muscle Tone: within normal limits Gait & Station: normal Patient leans: N/A   Psychiatric Specialty Exam:  Presentation  General Appearance: Appropriate for Environment; Casual  Eye  Contact:Good  Speech:Clear and Coherent; Normal Rate  Speech Volume:Normal  Handedness:Right   Mood and Affect  Mood:Euthymic  Affect:Appropriate; Congruent   Thought Process  Thought Processes:Coherent; Linear; Goal Directed  Descriptions of Associations:Intact  Orientation:Full (Time, Place and Person)  Thought Content:Logical  History of Schizophrenia/Schizoaffective disorder:No data recorded Duration of Psychotic Symptoms:No data recorded Hallucinations:Hallucinations: None  Ideas of Reference:None  Suicidal Thoughts:Suicidal Thoughts: No  Homicidal Thoughts:Homicidal Thoughts: No   Sensorium  Memory:Immediate Good; Recent Good; Remote Good  Judgment:Good  Insight:Good   Executive Functions  Concentration:Good  Attention Span:Good  Recall:Good  Fund of Knowledge:Good  Language:Good   Psychomotor Activity  Psychomotor Activity:Psychomotor Activity: Normal   Assets  Assets:Desire for Improvement; Manufacturing systems engineer; Leisure Time; Health and safety inspector; Social Support   Sleep  Sleep:Sleep: Fair   Physical Exam: Physical Exam Vitals and nursing note reviewed.  Constitutional:      Appearance: Normal appearance. He is normal weight.  HENT:     Head: Normocephalic.  Skin:    Capillary Refill: Capillary refill takes less than 2 seconds.  Neurological:     General: No focal deficit present.     Mental Status: He is alert and oriented to person, place, and time. Mental status is at baseline.  Psychiatric:        Attention and Perception: Attention and perception normal. He is attentive.        Mood and Affect: Mood normal.        Behavior: Behavior normal.        Thought Content: Thought content normal. Thought content does not include suicidal ideation.        Cognition and Memory: Cognition normal.        Judgment: Judgment normal. Judgment is not impulsive or inappropriate.    Review of Systems  Psychiatric/Behavioral:   Negative for depression, hallucinations, memory loss, substance abuse and suicidal ideas. The patient is not nervous/anxious and does not have insomnia.   All other systems reviewed and are negative.  Blood pressure 99/64, pulse 81, temperature 98.7 F (37.1 C), temperature source Oral, resp. rate 18, height 6\' 2"  (1.88 m), weight 74.8 kg, SpO2 100 %. Body  mass index is 21.18 kg/m.  Treatment Plan Summary: Daily contact with patient to assess and evaluate symptoms and progress in treatment, Medication management, and Plan Psychiatry will psych clear at this time.  -Continue gabapentin at this time for withdraw and anxiety management.  DC safety sitter -Psych clear at this time.  -Referral to [place] and NA support groups.   Disposition: No evidence of imminent risk to self or others at present.   Patient does not meet criteria for psychiatric inpatient admission. Supportive therapy provided about ongoing stressors. Refer to IOP. Discussed crisis plan, support from social network, calling 911, coming to the Emergency Department, and calling Suicide Hotline.  Suella Broad, FNP 02/06/2022 1:17 PM

## 2022-02-07 ENCOUNTER — Inpatient Hospital Stay: Payer: Self-pay | Admitting: Internal Medicine

## 2022-02-07 LAB — VITAMIN B1: Vitamin B1 (Thiamine): 192.8 nmol/L (ref 66.5–200.0)

## 2022-02-13 ENCOUNTER — Emergency Department (HOSPITAL_BASED_OUTPATIENT_CLINIC_OR_DEPARTMENT_OTHER)
Admission: EM | Admit: 2022-02-13 | Discharge: 2022-02-13 | Disposition: A | Discharge status: Home / Self Care | Payer: 59 | Attending: Emergency Medicine | Admitting: Emergency Medicine

## 2022-02-13 ENCOUNTER — Other Ambulatory Visit: Payer: Self-pay

## 2022-02-13 ENCOUNTER — Encounter (HOSPITAL_BASED_OUTPATIENT_CLINIC_OR_DEPARTMENT_OTHER): Payer: Self-pay | Admitting: Obstetrics and Gynecology

## 2022-02-13 DIAGNOSIS — J45909 Unspecified asthma, uncomplicated: Secondary | ICD-10-CM | POA: Diagnosis not present

## 2022-02-13 DIAGNOSIS — M79641 Pain in right hand: Secondary | ICD-10-CM | POA: Insufficient documentation

## 2022-02-13 NOTE — Discharge Instructions (Signed)
You were seen emergency room today with hand pain.  Your wound appears to be healing.  If you develop fever, redness to the area, drainage, hand swelling you should return to the emergency department for reevaluation.  I listed the name of a primary care doctor for follow-up.  You can also consider following with the orthopedist who performed your surgery.

## 2022-02-13 NOTE — ED Provider Notes (Signed)
Emergency Department Provider Note   I have reviewed the triage vital signs and the nursing notes.   HISTORY  Chief Complaint Hand Pain   HPI Stephen Shepard is a 31 y.o. male with past history reviewed below including polysubstance abuse presents emergency department with pain to the right hand.  Patient has past history of abscess and surgery requiring orthopedic drainage to the dorsum of the right hand.  He had a scab overlying part of the wound and states that yesterday the scab came off.  He is noticing some pink underlying tissue and is concerned for infection.  No fevers or chills.  No redness or swelling to the hand.  No new injury.   Past Medical History:  Diagnosis Date   Anxiety    Asthma    Fever and positive blood culture 01/31/2022   Seizure-like activity (HCC) 01/30/2022   Seizures (HCC)    Substance abuse (HCC)     Review of Systems  Constitutional: No fever/chills Cardiovascular: Denies chest pain. Respiratory: Denies shortness of breath. Gastrointestinal: No abdominal pain. Musculoskeletal: Negative for back pain. Skin: Positive hand wound.  ____________________________________________   PHYSICAL EXAM:  VITAL SIGNS: ED Triage Vitals  Enc Vitals Group     BP 02/13/22 0750 109/81     Pulse Rate 02/13/22 0750 69     Resp 02/13/22 0750 12     Temp 02/13/22 0750 98.1 F (36.7 C)     Temp Source 02/13/22 0752 Oral     SpO2 02/13/22 0750 96 %   Constitutional: Alert and oriented. Well appearing and in no acute distress. Eyes: Conjunctivae are normal. Head: Atraumatic. Nose: No congestion/rhinnorhea. Mouth/Throat: Mucous membranes are moist.   Neck: No stridor.   Cardiovascular: Normal rate, regular rhythm.  Respiratory: Normal respiratory effort.   Gastrointestinal: No distention.  Musculoskeletal: No gross deformities of extremities. Neurologic:  Normal speech and language.  Skin: Well-healing incision to the dorsum of the right hand with 1 cm  circular area of granulation tissue.  No cellulitis or abscess.  ____________________________________________   PROCEDURES  Procedure(s) performed:   Procedures  None  ____________________________________________   INITIAL IMPRESSION / ASSESSMENT AND PLAN / ED COURSE  Pertinent labs & imaging results that were available during my care of the patient were reviewed by me and considered in my medical decision making (see chart for details).   This patient is Presenting for Evaluation of hand pain, which does require a range of treatment options, and is a complaint that involves a high risk of morbidity and mortality.  The Differential Diagnoses include normal healing, abscess, cellulitis, fascitis, compartment syndrome   Social Determinants of Health Risk positive IVDA history.  Medical Decision Making: Summary:  Patient presents to the emergency department with a wound to the top of the right hand.  I do not appreciate any active infection signs.  This appears to be a 1 cm area of granulation tissue which was apparently recently underlying some eschar which has been removed.  The surrounding skin is not erythematous or warm.  The hand is not swollen.  The incision does not appear infected.  I do not feel that this warrants antibiotics.  We discussed wound management, dressing changes, Ortho/PCP follow-up.  Disposition: discharge  ____________________________________________  FINAL CLINICAL IMPRESSION(S) / ED DIAGNOSES  Final diagnoses:  Pain of right hand    Note:  This document was prepared using Dragon voice recognition software and may include unintentional dictation errors.  Alona Bene, MD, [facility]    Rainey Kahrs, Arlyss Repress, MD 02/13/22 [phone]

## 2022-02-13 NOTE — ED Triage Notes (Signed)
Patient reports to the ER for right hand pain. Patient reports he took off a band aid and had a scab come off and is concerned about an infection

## 2022-03-28 ENCOUNTER — Ambulatory Visit: Payer: Self-pay | Admitting: Internal Medicine

## 2022-07-28 ENCOUNTER — Emergency Department (HOSPITAL_COMMUNITY): Payer: 59

## 2022-07-28 ENCOUNTER — Other Ambulatory Visit: Payer: Self-pay

## 2022-07-28 ENCOUNTER — Observation Stay (HOSPITAL_COMMUNITY)
Admission: EM | Admit: 2022-07-28 | Discharge: 2022-07-31 | Disposition: A | Discharge status: Home / Self Care | Payer: 59 | Attending: Internal Medicine | Admitting: Internal Medicine

## 2022-07-28 DIAGNOSIS — Z888 Allergy status to other drugs, medicaments and biological substances status: Secondary | ICD-10-CM

## 2022-07-28 DIAGNOSIS — R42 Dizziness and giddiness: Secondary | ICD-10-CM | POA: Diagnosis not present

## 2022-07-28 DIAGNOSIS — M549 Dorsalgia, unspecified: Secondary | ICD-10-CM | POA: Diagnosis not present

## 2022-07-28 DIAGNOSIS — Z79899 Other long term (current) drug therapy: Secondary | ICD-10-CM

## 2022-07-28 DIAGNOSIS — F1410 Cocaine abuse, uncomplicated: Secondary | ICD-10-CM | POA: Diagnosis not present

## 2022-07-28 DIAGNOSIS — W19XXXA Unspecified fall, initial encounter: Secondary | ICD-10-CM | POA: Diagnosis not present

## 2022-07-28 DIAGNOSIS — F1123 Opioid dependence with withdrawal: Secondary | ICD-10-CM | POA: Diagnosis not present

## 2022-07-28 DIAGNOSIS — R932 Abnormal findings on diagnostic imaging of liver and biliary tract: Secondary | ICD-10-CM | POA: Diagnosis present

## 2022-07-28 DIAGNOSIS — R109 Unspecified abdominal pain: Secondary | ICD-10-CM | POA: Diagnosis not present

## 2022-07-28 DIAGNOSIS — R61 Generalized hyperhidrosis: Secondary | ICD-10-CM | POA: Insufficient documentation

## 2022-07-28 DIAGNOSIS — R001 Bradycardia, unspecified: Secondary | ICD-10-CM | POA: Insufficient documentation

## 2022-07-28 DIAGNOSIS — F17210 Nicotine dependence, cigarettes, uncomplicated: Secondary | ICD-10-CM | POA: Diagnosis present

## 2022-07-28 DIAGNOSIS — F419 Anxiety disorder, unspecified: Secondary | ICD-10-CM | POA: Insufficient documentation

## 2022-07-28 DIAGNOSIS — E440 Moderate protein-calorie malnutrition: Secondary | ICD-10-CM | POA: Insufficient documentation

## 2022-07-28 DIAGNOSIS — Z720 Tobacco use: Secondary | ICD-10-CM | POA: Diagnosis present

## 2022-07-28 DIAGNOSIS — Z886 Allergy status to analgesic agent status: Secondary | ICD-10-CM

## 2022-07-28 DIAGNOSIS — Z1152 Encounter for screening for COVID-19: Secondary | ICD-10-CM

## 2022-07-28 DIAGNOSIS — F1990 Other psychoactive substance use, unspecified, uncomplicated: Secondary | ICD-10-CM | POA: Diagnosis present

## 2022-07-28 DIAGNOSIS — F1193 Opioid use, unspecified with withdrawal: Secondary | ICD-10-CM | POA: Diagnosis not present

## 2022-07-28 DIAGNOSIS — Z6822 Body mass index (BMI) 22.0-22.9, adult: Secondary | ICD-10-CM | POA: Diagnosis not present

## 2022-07-28 DIAGNOSIS — R079 Chest pain, unspecified: Secondary | ICD-10-CM | POA: Diagnosis not present

## 2022-07-28 DIAGNOSIS — R112 Nausea with vomiting, unspecified: Secondary | ICD-10-CM | POA: Diagnosis present

## 2022-07-28 DIAGNOSIS — R7989 Other specified abnormal findings of blood chemistry: Secondary | ICD-10-CM | POA: Diagnosis present

## 2022-07-28 DIAGNOSIS — R4182 Altered mental status, unspecified: Principal | ICD-10-CM

## 2022-07-28 DIAGNOSIS — F1210 Cannabis abuse, uncomplicated: Secondary | ICD-10-CM | POA: Diagnosis not present

## 2022-07-28 DIAGNOSIS — F1310 Sedative, hypnotic or anxiolytic abuse, uncomplicated: Secondary | ICD-10-CM | POA: Diagnosis present

## 2022-07-28 DIAGNOSIS — B1920 Unspecified viral hepatitis C without hepatic coma: Secondary | ICD-10-CM | POA: Diagnosis not present

## 2022-07-28 DIAGNOSIS — R509 Fever, unspecified: Secondary | ICD-10-CM | POA: Insufficient documentation

## 2022-07-28 LAB — CBC WITH DIFFERENTIAL/PLATELET
Abs Immature Granulocytes: 0.02 10*3/uL (ref 0.00–0.07)
Basophils Absolute: 0 10*3/uL (ref 0.0–0.1)
Basophils Relative: 0 %
Eosinophils Absolute: 0 10*3/uL (ref 0.0–0.5)
Eosinophils Relative: 0 %
HCT: 32.9 % — ABNORMAL LOW (ref 39.0–52.0)
Hemoglobin: 11 g/dL — ABNORMAL LOW (ref 13.0–17.0)
Immature Granulocytes: 0 %
Lymphocytes Relative: 22 %
Lymphs Abs: 1.5 10*3/uL (ref 0.7–4.0)
MCH: 28.6 pg (ref 26.0–34.0)
MCHC: 33.4 g/dL (ref 30.0–36.0)
MCV: 85.7 fL (ref 80.0–100.0)
Monocytes Absolute: 0.6 10*3/uL (ref 0.1–1.0)
Monocytes Relative: 9 %
Neutro Abs: 4.7 10*3/uL (ref 1.7–7.7)
Neutrophils Relative %: 69 %
Platelets: 249 10*3/uL (ref 150–400)
RBC: 3.84 MIL/uL — ABNORMAL LOW (ref 4.22–5.81)
RDW: 14.4 % (ref 11.5–15.5)
WBC: 6.9 10*3/uL (ref 4.0–10.5)
nRBC: 0 % (ref 0.0–0.2)

## 2022-07-28 LAB — I-STAT VENOUS BLOOD GAS, ED
Acid-base deficit: 1 mmol/L (ref 0.0–2.0)
Bicarbonate: 23.5 mmol/L (ref 20.0–28.0)
Calcium, Ion: 1.05 mmol/L — ABNORMAL LOW (ref 1.15–1.40)
HCT: 32 % — ABNORMAL LOW (ref 39.0–52.0)
Hemoglobin: 10.9 g/dL — ABNORMAL LOW (ref 13.0–17.0)
O2 Saturation: 46 %
Potassium: 3.5 mmol/L (ref 3.5–5.1)
Sodium: 144 mmol/L (ref 135–145)
TCO2: 25 mmol/L (ref 22–32)
pCO2, Ven: 37 mmHg — ABNORMAL LOW (ref 44–60)
pH, Ven: 7.411 (ref 7.25–7.43)
pO2, Ven: 25 mmHg — CL (ref 32–45)

## 2022-07-28 LAB — COMPREHENSIVE METABOLIC PANEL
ALT: 17 U/L (ref 0–44)
AST: 24 U/L (ref 15–41)
Albumin: 3.3 g/dL — ABNORMAL LOW (ref 3.5–5.0)
Alkaline Phosphatase: 47 U/L (ref 38–126)
Anion gap: 6 (ref 5–15)
BUN: 8 mg/dL (ref 6–20)
CO2: 25 mmol/L (ref 22–32)
Calcium: 8.1 mg/dL — ABNORMAL LOW (ref 8.9–10.3)
Chloride: 110 mmol/L (ref 98–111)
Creatinine, Ser: 0.83 mg/dL (ref 0.61–1.24)
GFR, Estimated: >60 mL/min (ref >60)
Glucose, Bld: 112 mg/dL — ABNORMAL HIGH (ref 70–99)
Potassium: 3.5 mmol/L (ref 3.5–5.1)
Sodium: 141 mmol/L (ref 135–145)
Total Bilirubin: 0.5 mg/dL (ref 0.3–1.2)
Total Protein: 6.5 g/dL (ref 6.5–8.1)

## 2022-07-28 LAB — URINALYSIS, ROUTINE W REFLEX MICROSCOPIC
Bilirubin Urine: NEGATIVE
Glucose, UA: NEGATIVE mg/dL
Hgb urine dipstick: NEGATIVE
Ketones, ur: 20 mg/dL — AB
Leukocytes,Ua: NEGATIVE
Nitrite: NEGATIVE
Protein, ur: 100 mg/dL — AB
Specific Gravity, Urine: 1.017 (ref 1.005–1.030)
pH: 7 (ref 5.0–8.0)

## 2022-07-28 LAB — I-STAT CHEM 8, ED
BUN: 7 mg/dL (ref 6–20)
Calcium, Ion: 1.08 mmol/L — ABNORMAL LOW (ref 1.15–1.40)
Chloride: 106 mmol/L (ref 98–111)
Creatinine, Ser: 0.7 mg/dL (ref 0.61–1.24)
Glucose, Bld: 106 mg/dL — ABNORMAL HIGH (ref 70–99)
HCT: 33 % — ABNORMAL LOW (ref 39.0–52.0)
Hemoglobin: 11.2 g/dL — ABNORMAL LOW (ref 13.0–17.0)
Potassium: 3.5 mmol/L (ref 3.5–5.1)
Sodium: 143 mmol/L (ref 135–145)
TCO2: 23 mmol/L (ref 22–32)

## 2022-07-28 LAB — TROPONIN I (HIGH SENSITIVITY)
Troponin I (High Sensitivity): 4 ng/L (ref <18)
Troponin I (High Sensitivity): 7 ng/L (ref <18)

## 2022-07-28 LAB — MAGNESIUM: Magnesium: 1.7 mg/dL (ref 1.7–2.4)

## 2022-07-28 LAB — RAPID URINE DRUG SCREEN, HOSP PERFORMED
Amphetamines: NOT DETECTED
Barbiturates: NOT DETECTED
Benzodiazepines: POSITIVE — AB
Cocaine: POSITIVE — AB
Opiates: NOT DETECTED
Tetrahydrocannabinol: POSITIVE — AB

## 2022-07-28 LAB — TSH: TSH: 0.056 u[IU]/mL — ABNORMAL LOW (ref 0.350–4.500)

## 2022-07-28 LAB — AMMONIA: Ammonia: 15 umol/L (ref 9–35)

## 2022-07-28 LAB — LACTIC ACID, PLASMA: Lactic Acid, Venous: 1 mmol/L (ref 0.5–1.9)

## 2022-07-28 MED ORDER — IOHEXOL 350 MG/ML SOLN
75.0000 mL | Freq: Once | INTRAVENOUS | Status: AC | PRN
  Administered 2022-07-28: 75 mL via INTRAVENOUS

## 2022-07-28 MED ORDER — SODIUM CHLORIDE 0.9 % IV BOLUS
1000.0000 mL | Freq: Once | INTRAVENOUS | Status: AC
  Administered 2022-07-28: 1000 mL via INTRAVENOUS

## 2022-07-28 MED ORDER — ONDANSETRON HCL 4 MG/2ML IJ SOLN
4.0000 mg | Freq: Once | INTRAMUSCULAR | Status: AC
  Administered 2022-07-28: 4 mg via INTRAVENOUS
  Filled 2022-07-28: qty 2

## 2022-07-28 MED ORDER — SODIUM CHLORIDE 0.9 % IV SOLN
25.0000 mg | Freq: Four times a day (QID) | INTRAVENOUS | Status: DC | PRN
  Administered 2022-07-28: 25 mg via INTRAVENOUS
  Filled 2022-07-28: qty 1

## 2022-07-28 MED ORDER — ATROPINE SULFATE 1 MG/10ML IJ SOSY
PREFILLED_SYRINGE | INTRAMUSCULAR | Status: AC
  Filled 2022-07-28: qty 10

## 2022-07-28 NOTE — ED Provider Notes (Signed)
[facility] EMERGENCY DEPARTMENT Provider Note   CSN: [id number] Arrival date & time: 07/28/22  1231     History  Chief Complaint  Patient presents with   Bradycardia    Stephen Shepard is a 31 y.o. male.  The history is provided by the patient, the EMS personnel and medical records. The history is limited by the condition of the patient. No language interpreter was used.  Altered Mental Status Presenting symptoms: confusion   Severity:  Moderate Most recent episode:  Today Episode history:  Continuous Timing:  Constant Chronicity:  New Context: head injury   Context: not dementia   Associated symptoms: abdominal pain, headaches, nausea and vomiting   Associated symptoms: no fever, no hallucinations, no light-headedness, no palpitations, no rash, no seizures and no weakness        Home Medications Prior to Admission medications   Medication Sig Start Date End Date Taking? Authorizing Provider  acetaminophen (TYLENOL) 500 MG tablet Take 500 mg by mouth every 6 (six) hours as needed for moderate pain. Patient not taking: Reported on 01/30/2022    [provider]  gabapentin (NEURONTIN) 300 MG capsule Take 1 capsule (300 mg total) by mouth 2 (two) times daily. 02/06/22   Clydie Braun, MD  hydrOXYzine (ATARAX) 25 MG tablet Take 1 tablet (25 mg total) by mouth every 6 (six) hours as needed for anxiety. 02/06/22   Madelyn Flavors A, MD  nicotine (NICODERM CQ - DOSED IN MG/24 HOURS) 21 mg/24hr patch Place 1 patch (21 mg total) onto the skin daily. 02/06/22   Clydie Braun, MD  nicotine polacrilex (NICORETTE) 2 MG gum Take 1 each (2 mg total) by mouth as needed for smoking cessation. 02/06/22   Clydie Braun, MD      Allergies    Ibuprofen, Keppra [levetiracetam], Wasp venom protein, and Nitroglycerin    Review of Systems   Review of Systems  Constitutional:  Positive for chills, diaphoresis and fatigue. Negative for fever.  HENT:  Negative for  congestion.   Eyes:  Negative for visual disturbance.  Respiratory:  Negative for cough, chest tightness, shortness of breath and wheezing.   Cardiovascular:  Negative for chest pain, palpitations and leg swelling.  Gastrointestinal:  Positive for abdominal pain, nausea and vomiting. Negative for constipation and diarrhea.  Genitourinary:  Negative for dysuria and flank pain.  Musculoskeletal:  Negative for back pain, neck pain and neck stiffness.  Skin:  Negative for rash.  Neurological:  Positive for headaches. Negative for seizures, weakness, light-headedness and numbness.  Psychiatric/Behavioral:  Positive for confusion. Negative for hallucinations.   All other systems reviewed and are negative.   Physical Exam Updated Vital Signs BP (!) 154/89   Pulse (!) 51   Temp 98.1 F (36.7 C)   Resp (!) 23   Ht 6\' 2"  (1.88 m)   Wt 80.3 kg   SpO2 97%   BMI 22.73 kg/m  Physical Exam Vitals and nursing note reviewed.  Constitutional:      General: He is not in acute distress.    Appearance: He is well-developed. He is ill-appearing and diaphoretic. He is not toxic-appearing.  HENT:     Head: Normocephalic and atraumatic.     Nose: No congestion or rhinorrhea.     Mouth/Throat:     Mouth: Mucous membranes are dry.     Pharynx: No oropharyngeal exudate or posterior oropharyngeal erythema.  Eyes:     Extraocular Movements: Extraocular movements intact.  Conjunctiva/sclera: Conjunctivae normal.     Pupils: Pupils are equal, round, and reactive to light.  Cardiovascular:     Rate and Rhythm: Regular rhythm. Bradycardia present.     Heart sounds: No murmur heard. Pulmonary:     Effort: Pulmonary effort is normal. No respiratory distress.     Breath sounds: Normal breath sounds. No wheezing, rhonchi or rales.  Chest:     Chest wall: No tenderness.  Abdominal:     General: Abdomen is flat.     Palpations: Abdomen is soft.     Tenderness: There is abdominal tenderness. There is no  right CVA tenderness, left CVA tenderness, guarding or rebound.  Musculoskeletal:        General: No swelling or tenderness.     Cervical back: Neck supple. No tenderness.     Right lower leg: No edema.     Left lower leg: No edema.  Skin:    General: Skin is warm.     Capillary Refill: Capillary refill takes less than 2 seconds.     Coloration: Skin is not pale.     Findings: No erythema or rash.  Neurological:     General: No focal deficit present.     Mental Status: He is alert.     Motor: No weakness.     ED Results / Procedures / Treatments   Labs (all labs ordered are listed, but only abnormal results are displayed) Labs Reviewed  CBC WITH DIFFERENTIAL/PLATELET - Abnormal; Notable for the following components:      Result Value   RBC 3.84 (*)    Hemoglobin 11.0 (*)    HCT 32.9 (*)    All other components within normal limits  COMPREHENSIVE METABOLIC PANEL - Abnormal; Notable for the following components:   Glucose, Bld 112 (*)    Calcium 8.1 (*)    Albumin 3.3 (*)    All other components within normal limits  TSH - Abnormal; Notable for the following components:   TSH 0.056 (*)    All other components within normal limits  URINALYSIS, ROUTINE W REFLEX MICROSCOPIC - Abnormal; Notable for the following components:   Ketones, ur 20 (*)    Protein, ur 100 (*)    Bacteria, UA RARE (*)    All other components within normal limits  I-STAT VENOUS BLOOD GAS, ED - Abnormal; Notable for the following components:   pCO2, Ven 37.0 (*)    pO2, Ven 25 (*)    Calcium, Ion 1.05 (*)    HCT 32.0 (*)    Hemoglobin 10.9 (*)    All other components within normal limits  I-STAT CHEM 8, ED - Abnormal; Notable for the following components:   Glucose, Bld 106 (*)    Calcium, Ion 1.08 (*)    Hemoglobin 11.2 (*)    HCT 33.0 (*)    All other components within normal limits  URINE CULTURE  CULTURE, BLOOD (ROUTINE X 2)  CULTURE, BLOOD (ROUTINE X 2)  MAGNESIUM  AMMONIA  RAPID URINE  DRUG SCREEN, HOSP PERFORMED  LACTIC ACID, PLASMA  LACTIC ACID, PLASMA  TROPONIN I (HIGH SENSITIVITY)  TROPONIN I (HIGH SENSITIVITY)    EKG EKG Interpretation  Date/Time:  Friday July 28 2022 12:44:57 EST Ventricular Rate:  46 PR Interval:  147 QRS Duration: 98 QT Interval:  505 QTC Calculation: 442 R Axis:   65 Text Interpretation: Sinus bradycardia Atrial premature complex RSR' in V1 or V2, probably normal variant Probable left ventricular hypertrophy when  compared to prior, slower rate. NO STEMI Confirmed by Theda Belfastegeler, Chris ([id number]) on 07/28/2022 12:52:20 PM  Radiology DG Chest Portable 1 View  Result Date: 07/28/2022 CLINICAL DATA:  Altered mental status EXAM: PORTABLE CHEST 1 VIEW COMPARISON:  CXR 01/30/22 FINDINGS: Pleural effusion. No pneumothorax. Unchanged cardiac and mediastinal contours when accounting for differences in lung volumes. No displaced rib fractures. No focal airspace opacity. There are prominent bilateral interstitial opacities. Visualized upper abdomen is unremarkable. IMPRESSION: Prominent bilateral interstitial opacities could represent pulmonary edema or atypical infection. No focal airspace opacity to suggest pneumonia. Electronically Signed   By: Lorenza CambridgeHemant  Desai M.D.   On: 07/28/2022 13:21    Procedures Procedures    Medications Ordered in ED Medications - No data to display   ED Course/ Medical Decision Making/ A&P                           Medical Decision Making Amount and/or Complexity of Data Reviewed Labs: ordered. Radiology: ordered.    Ethelle Lyoniah Bowermaster is a [age] y.o. male with a past medical history significant for previous IV drug use, hepatitis C, seizures, and asthma who presents for altered mental status, nausea, vomiting, chills, abdominal pain, bradycardia, fall with head injury, diaphoresis, and sense of doom.  According to EMS, patient fell and hit his head today but before that was feeling nausea, vomiting, abdominal pain, and fatigue.   He said he had some chills.  He denies any urinary symptoms or constipation or diarrhea but has had nausea and vomiting and pain.  He denies any chest pain, shortness of breath or palpitations but was found to bradycardic in the 30s with EMS.  He was not hypotensive but actually was hypertensive in the 160s.  He is complaining of headache after hitting his head and he does not member the context of it.  He is denying any neck pain or neck stiffness.  He is denying any other trauma.  Denies pain in his arms or legs.  He is acting somewhat confused and has waxing and waning mental status.  He has not any neck pain or neck stiffness.  On arrival heart rate is in the 40s and EKG appears to show a sinus bradycardia with some irregularity.  No STEMI seen.  Patient reports he has not had not smoked any marijuana in several days.  EMS reports he looked extremely ill covered in diaphoresis and with a sense of impending doom.  EMS said glucose was in the 117 range.  On my exam, lungs were clear and chest was nontender.  Abdomen was diffusely tender but bowel sounds were appreciated.  He was moving all extremities with intact sensation and strength in extremities.  Symmetric smile.  Speech was clear.  Pupils are symmetric and reactive at about 4 mm.  They were not pinpoint.  No evidence of acute trauma to his head but he is having some headache after hitting his head.  He seems somewhat confused and is perseverating on needing a blanket with chills.  Clinically I am somewhat concerned about the patient's altered mental status, head injury, and bradycardia as well as his abdominal pain with nausea and vomiting.  We will get workup including labs, CT abdomen pelvis to look for appendicitis cholecystitis or other intra-abdominal pathology, will get a CT of the head given his head injury and will get a chest x-ray with his diaphoresis bradycardia, and sense of doom.  Anticipate reassessment after workup to  determine  disposition.  Anticipate care transferred to oncoming team to await results of workup to determine disposition.  Care transferred to oncoming team to await workup results to determine disposition.        Final Clinical Impression(s) / ED Diagnoses Final diagnoses:  Altered mental status, unspecified altered mental status type  Fall, initial encounter  Bradycardia     Clinical Impression: 1. Altered mental status, unspecified altered mental status type   2. Fall, initial encounter   3. Bradycardia     Disposition: Care transferred to oncoming team to await workup results.  This note was prepared with assistance of Conservation officer, historic buildings. Occasional wrong-word or sound-a-like substitutions may have occurred due to the inherent limitations of voice recognition software.     Arrick Dutton, Canary Brim, MD 07/28/22 [phone]

## 2022-07-28 NOTE — ED Provider Notes (Signed)
31 yo male presenting with fall and head injury, complaining of nausea, abdominal pain, vomiting.  Pending CT head and abdomen  Physical Exam  BP (!) 154/89   Pulse (!) 51   Temp 98.1 F (36.7 C)   Resp (!) 23   Ht 6\' 2"  (1.88 m)   Wt 80.3 kg   SpO2 97%   BMI 22.73 kg/m   Physical Exam  Procedures  Procedures  ED Course / MDM    Medical Decision Making Amount and/or Complexity of Data Reviewed Labs: ordered. Radiology: ordered.  Risk Prescription drug management.   CT scan showed pericholecystic fluid.  Subsequent ultrasound showed some mild gallbladder wall thickening and again pericholecystic fluid.  I examined the patient.  He has a very bizarre affect, is only minimally compliant with me, I have a difficult time getting him to lie on his back to let me examine him.  Where I palpate on his abdomen he is voluntary guarding all over.  It is very difficult to ascertain whether he truly has a positive Murphy sign.  However given these findings on his imaging, as well as his GI complaints.  I will put a consultation to general surgery.  *  Dr from general surgery was consulted.  She does not feel there is an acute surgical emergency.  She advised that we attempt nausea control and attempt p.o. challenge.  I discussed this with the patient but he is still feeling too queasy to eat or drink at this time.  Therefore I have ordered antiemetics and will try to p.o. challenge afterwards.  If he can tolerate food and fluids and is feeling better he could potentially be discharged with antiemetics.  If not he may require medical admission.  I have a low suspicion for sepsis or bacteremia or another emergent process ongoing given his lab work.  No leukocytosis, lactate within normal limits.  UA without evidence of infection.  Pt signed out to Dr Fredricka Bonine EDP at 11 pm pending reassessment after antiemetics      Stephen Rea, MD 07/28/22 2320

## 2022-07-28 NOTE — Consult Note (Signed)
Ethelle Lyon January 03, 1991  [id number].    Requesting MD: Dr. Renaye Rakers Chief Complaint/Reason for Consult: abdominal pain, possible cholecystitis  HPI:  Mr. Willinger is a 32 yo male who presented to the ED today with multiple complaints including abdominal pain, nausea, and vomiting. He was brought by EMS, who reported the patient also fell and hit his head today. Per EMS he was diaphoretic and bradycardic on their arrival. A head CT was negative. CT abdomen/pelvis showed some pericholecystic fluid but no other acute findings. WBC and LFTs are normal. A RUQ [place] was done and showed a small amount of pericholecystic fluid but no gallstones or wall thickening. General surgery was consulted.   On my exam the patient was minimally interactive and provided very little history. He does say his symptoms began today, and reports the pain is all over his abdomen and not localized to one area. He says it does not worsen with eating. He requests to eat. He has a history of substance abuse and psychosis.   ROS: Review of Systems  Constitutional:  Positive for chills. Negative for fever.  Respiratory:  Negative for shortness of breath.   Gastrointestinal:  Positive for abdominal pain, nausea and vomiting.  Psychiatric/Behavioral:  Positive for substance abuse.     No family history on file.  Past Medical History:  Diagnosis Date   Anxiety    Asthma    Fever and positive blood culture 01/31/2022   Seizure-like activity (HCC) 01/30/2022   Seizures (HCC)    Substance abuse (HCC)     Past Surgical History:  Procedure Laterality Date   I & D EXTREMITY Right 12/15/2021   Procedure: IRRIGATION AND DEBRIDEMENT RIGHT HAND AND SHOULDER;  Surgeon: Bradly Bienenstock, MD;  Location: [facility];  Service: Orthopedics;  Laterality: Right;  upper and lower arm   TEE WITHOUT CARDIOVERSION N/A 12/21/2021   Procedure: TRANSESOPHAGEAL ECHOCARDIOGRAM (TEE);  Surgeon: Little Ishikawa, MD;  Location: [facility] ENDOSCOPY;  Service:  Cardiovascular;  Laterality: N/A;    Social History:  reports that he has been smoking cigarettes. He has a 17.00 pack-year smoking history. He has been exposed to tobacco smoke. He has never used smokeless tobacco. He reports current drug use. Drug: IV. He reports that he does not drink alcohol.  Allergies:  Allergies  Allergen Reactions   Ibuprofen Anaphylaxis   Keppra [Levetiracetam] Anaphylaxis   Wasp Venom Protein Swelling    hives   Nitroglycerin Other (See Comments)    Pt reports "real bad chest pain"    (Not in a hospital admission)    Physical Exam: Blood pressure (!) 142/89, pulse (!) 58, temperature 98.2 F (36.8 C), temperature source Oral, resp. rate 20, height 6\' 2"  (1.88 m), weight 80.3 kg, SpO2 98 %. General: resting comfortably, appears stated age, no apparent distress Neurological: alert HEENT: normocephalic, atraumatic CV: bradycardic 50s, regular Respiratory: normal work of breathing on room air Abdomen: limited to left side as patient declined to lay supine for a full exam. There is voluntary guarding on palpation, but abdomen is soft and nondistended. Skin: warm and dry   Results for orders placed or performed during the hospital encounter of 07/28/22 (from the past 48 hour(s))  Ammonia     Status: None   Collection Time: 07/28/22 12:48 PM  Result Value Ref Range   Ammonia 15 9 - 35 umol/L    Comment: Performed at [facility], [address] [place], [place]  I-stat chem 8, ED (not  at [facility], [facility] or [facility])     Status: Abnormal   Collection Time: 07/28/22 12:48 PM  Result Value Ref Range   Sodium 143 135 - 145 mmol/L   Potassium 3.5 3.5 - 5.1 mmol/L   Chloride 106 98 - 111 mmol/L   BUN 7 6 - 20 mg/dL   Creatinine, Ser 4.090.70 0.61 - 1.24 mg/dL   Glucose, Bld 811106 (H) 70 - 99 mg/dL    Comment: Glucose reference range applies only to samples taken after fasting for at least 8 hours.   Calcium, Ion 1.08 (L) 1.15 - 1.40 mmol/L   TCO2 23 22 -  32 mmol/L   Hemoglobin 11.2 (L) 13.0 - 17.0 g/dL   HCT 91.433.0 (L) 78.239.0 - 95.652.0 %  Magnesium     Status: None   Collection Time: 07/28/22 12:49 PM  Result Value Ref Range   Magnesium 1.7 1.7 - 2.4 mg/dL    Comment: Performed at [facility], [address] [place], [place] [address]  CBC with Differential     Status: Abnormal   Collection Time: 07/28/22 12:49 PM  Result Value Ref Range   WBC 6.9 4.0 - 10.5 K/uL   RBC 3.84 (L) 4.22 - 5.81 MIL/uL   Hemoglobin 11.0 (L) 13.0 - 17.0 g/dL   HCT 65.732.9 (L) 84.639.0 - 96.252.0 %   MCV 85.7 80.0 - 100.0 fL   MCH 28.6 26.0 - 34.0 pg   MCHC 33.4 30.0 - 36.0 g/dL   RDW 95.214.4 84.111.5 - 32.415.5 %   Platelets 249 150 - 400 K/uL   nRBC 0.0 0.0 - 0.2 %   Neutrophils Relative % 69 %   Neutro Abs 4.7 1.7 - 7.7 K/uL   Lymphocytes Relative 22 %   Lymphs Abs 1.5 0.7 - 4.0 K/uL   Monocytes Relative 9 %   Monocytes Absolute 0.6 0.1 - 1.0 K/uL   Eosinophils Relative 0 %   Eosinophils Absolute 0.0 0.0 - 0.5 K/uL   Basophils Relative 0 %   Basophils Absolute 0.0 0.0 - 0.1 K/uL   Immature Granulocytes 0 %   Abs Immature Granulocytes 0.02 0.00 - 0.07 K/uL    Comment: Performed at [facility], [address] [place], [place] [address]  Comprehensive metabolic panel     Status: Abnormal   Collection Time: 07/28/22 12:49 PM  Result Value Ref Range   Sodium 141 135 - 145 mmol/L   Potassium 3.5 3.5 - 5.1 mmol/L   Chloride 110 98 - 111 mmol/L   CO2 25 22 - 32 mmol/L   Glucose, Bld 112 (H) 70 - 99 mg/dL    Comment: Glucose reference range applies only to samples taken after fasting for at least 8 hours.   BUN 8 6 - 20 mg/dL   Creatinine, Ser 7.250.83 0.61 - 1.24 mg/dL   Calcium 8.1 (L) 8.9 - 10.3 mg/dL   Total Protein 6.5 6.5 - 8.1 g/dL   Albumin 3.3 (L) 3.5 - 5.0 g/dL   AST 24 15 - 41 U/L   ALT 17 0 - 44 U/L   Alkaline Phosphatase 47 38 - 126 U/L   Total Bilirubin 0.5 0.3 - 1.2 mg/dL   GFR, Estimated >36>60 >64>60 mL/min    Comment: (NOTE) Calculated using the  CKD-EPI Creatinine Equation (2021)    Anion gap 6 5 - 15    Comment: Performed at [facility], [address] [place], [place] [address]  Troponin I (High  Sensitivity)     Status: None   Collection Time: 07/28/22 12:49 PM  Result Value Ref Range   Troponin I (High Sensitivity) 4 <18 ng/L    Comment: (NOTE) Elevated high sensitivity troponin I (hsTnI) values and significant  changes across serial measurements may suggest ACS but many other  chronic and acute conditions are known to elevate hsTnI results.  Refer to the "Links" section for chest pain algorithms and additional  guidance. Performed at [facility], [address], [place], [place] [address]   I-Stat venous blood gas, [facility] ED, [facility], [facility])     Status: Abnormal   Collection Time: 07/28/22 12:49 PM  Result Value Ref Range   pH, Ven 7.411 7.25 - 7.43   pCO2, Ven 37.0 (L) 44 - 60 mmHg   pO2, Ven 25 (LL) 32 - 45 mmHg   Bicarbonate 23.5 20.0 - 28.0 mmol/L   TCO2 25 22 - 32 mmol/L   O2 Saturation 46 %   Acid-base deficit 1.0 0.0 - 2.0 mmol/L   Sodium 144 135 - 145 mmol/L   Potassium 3.5 3.5 - 5.1 mmol/L   Calcium, Ion 1.05 (L) 1.15 - 1.40 mmol/L   HCT 32.0 (L) 39.0 - 52.0 %   Hemoglobin 10.9 (L) 13.0 - 17.0 g/dL   Sample type VENOUS    Comment NOTIFIED PHYSICIAN   TSH     Status: Abnormal   Collection Time: 07/28/22 12:50 PM  Result Value Ref Range   TSH 0.056 (L) 0.350 - 4.500 uIU/mL    Comment: Performed by a 3rd Generation assay with a functional sensitivity of <=0.01 uIU/mL. Performed at [facility], [address], [place], [place] [address]   Troponin I (High Sensitivity)     Status: None   Collection Time: 07/28/22  2:43 PM  Result Value Ref Range   Troponin I (High Sensitivity) 7 <18 ng/L    Comment: (NOTE) Elevated high sensitivity troponin I (hsTnI) values and significant  changes across serial measurements may suggest ACS but many other  chronic and acute conditions are known to elevate  hsTnI results.  Refer to the "Links" section for chest pain algorithms and additional  guidance. Performed at [facility], [address] [place], [place] [address]   Rapid urine drug screen (hospital performed)     Status: Abnormal   Collection Time: 07/28/22  2:52 PM  Result Value Ref Range   Opiates NONE DETECTED NONE DETECTED   Cocaine POSITIVE (A) NONE DETECTED   Benzodiazepines POSITIVE (A) NONE DETECTED   Amphetamines NONE DETECTED NONE DETECTED   Tetrahydrocannabinol POSITIVE (A) NONE DETECTED   Barbiturates NONE DETECTED NONE DETECTED    Comment: (NOTE) DRUG SCREEN FOR MEDICAL PURPOSES ONLY.  IF CONFIRMATION IS NEEDED FOR ANY PURPOSE, NOTIFY LAB WITHIN 5 DAYS.  LOWEST DETECTABLE LIMITS FOR URINE DRUG SCREEN Drug Class                     Cutoff (ng/mL) Amphetamine and metabolites    1000 Barbiturate and metabolites    200 Benzodiazepine                 200 Opiates and metabolites        300 Cocaine and metabolites        300 THC                            50 Performed  at [facility], [address], [place], [place] [address]   Urinalysis, Routine w reflex microscopic Urine, Catheterized     Status: Abnormal   Collection Time: 07/28/22  2:52 PM  Result Value Ref Range   Color, Urine YELLOW YELLOW   APPearance CLEAR CLEAR   Specific Gravity, Urine 1.017 1.005 - 1.030   pH 7.0 5.0 - 8.0   Glucose, UA NEGATIVE NEGATIVE mg/dL   Hgb urine dipstick NEGATIVE NEGATIVE   Bilirubin Urine NEGATIVE NEGATIVE   Ketones, ur 20 (A) NEGATIVE mg/dL   Protein, ur 301 (A) NEGATIVE mg/dL   Nitrite NEGATIVE NEGATIVE   Leukocytes,Ua NEGATIVE NEGATIVE   RBC / HPF 0-5 0 - 5 RBC/hpf   WBC, UA 0-5 0 - 5 WBC/hpf   Bacteria, UA RARE (A) NONE SEEN   Squamous Epithelial / LPF 0-5 0 - 5 /HPF   Mucus PRESENT     Comment: Performed at [facility], [address], [place], [place] [address]  Lactic acid, plasma     Status: None   Collection Time: 07/28/22  2:53 PM   Result Value Ref Range   Lactic Acid, Venous 1.0 0.5 - 1.9 mmol/L    Comment: Performed at [facility], [address] [place], [place] [address]   US Abdomen Limited RUQ (LIVER/GB)  Result Date: 07/28/2022 CLINICAL DATA:  Abdominal pain. EXAM: ULTRASOUND ABDOMEN LIMITED RIGHT UPPER QUADRANT COMPARISON:  None Available. FINDINGS: Gallbladder: No gallstones are visualized. The gallbladder wall measures 2.98 mm in thickness. A mild amount of pericholecystic fluid is seen. No sonographic Murphy sign noted by sonographer. Common bile duct: Diameter: 1.96 mm Liver: No focal lesion identified. Within normal limits in parenchymal echogenicity. Portal vein is patent on color Doppler imaging with normal direction of blood flow towards the liver. Other: None. IMPRESSION: Pericholecystic fluid and mild gallbladder wall thickening, without evidence of cholelithiasis or acute cholecystitis. Electronically Signed   By: Aram Candela M.D.   On: 07/28/2022 21:05   CT ABDOMEN PELVIS W CONTRAST  Result Date: 07/28/2022 CLINICAL DATA:  Abdominal pain, nausea, vomiting, altered mental status. Bradycardia. EXAM: CT ABDOMEN AND PELVIS WITH CONTRAST TECHNIQUE: Multidetector CT imaging of the abdomen and pelvis was performed using the standard protocol following bolus administration of intravenous contrast. RADIATION DOSE REDUCTION: This exam was performed according to the departmental dose-optimization program which includes automated exposure control, adjustment of the mA and/or kV according to patient size and/or use of iterative reconstruction technique. CONTRAST:  75 mL Omnipaque 350non-ionic IV contrast COMPARISON:  01/30/2022 FINDINGS: Lower chest: No acute abnormality. Minimal dependent basilar subsegmental atelectasis. Hepatobiliary: No focal liver abnormality is seen. No gallstones, gallbladder wall thickening, or biliary dilatation. Pericholecystic fluid is noted. If there is concern cholecystitis  consider ultrasound correlation. Pancreas: Unremarkable. No pancreatic ductal dilatation or surrounding inflammatory changes. Spleen: Normal in size without focal abnormality. Adrenals/Urinary Tract: Adrenal glands are unremarkable. Kidneys are normal, without renal calculi, focal lesion, or hydronephrosis. Bladder is unremarkable. Stomach/Bowel: Stomach is within normal limits. Appendix appears normal. No evidence of bowel wall thickening, distention, or inflammatory changes. Vascular/Lymphatic: No significant vascular findings are present. No enlarged abdominal or pelvic lymph nodes. Reproductive: The prostate is unremarkable. Other: No abdominal wall hernia or abnormality. No abdominopelvic ascites. Musculoskeletal: No acute or significant osseous findings. IMPRESSION: Pericholecystic fluid. Otherwise no acute abdominal or pelvic pathology identified. Electronically Signed   By: Layla Maw M.D.   On: 07/28/2022 18:41   CT HEAD WO  CONTRAST ( )  Result Date: 07/28/2022 CLINICAL DATA:  Head trauma EXAM: CT HEAD WITHOUT CONTRAST TECHNIQUE: Contiguous axial images were obtained from the base of the skull through the vertex without intravenous contrast. RADIATION DOSE REDUCTION: This exam was performed according to the departmental dose-optimization program which includes automated exposure control, adjustment of the mA and/or kV according to patient size and/or use of iterative reconstruction technique. COMPARISON:  02/01/2022 FINDINGS: Brain: No evidence of acute infarction, hemorrhage, hydrocephalus, extra-axial collection or mass lesion/mass effect. Vascular: No hyperdense vessel or unexpected calcification. Skull: Normal. Negative for fracture or focal lesion. Sinuses/Orbits: No acute finding. IMPRESSION: No acute intracranial process. Electronically Signed   By: Layla Maw M.D.   On: 07/28/2022 18:37   DG Chest Portable 1 View  Result Date: 07/28/2022 CLINICAL DATA:  Altered mental status  EXAM: PORTABLE CHEST 1 VIEW COMPARISON:  CXR 01/30/22 FINDINGS: Pleural effusion. No pneumothorax. Unchanged cardiac and mediastinal contours when accounting for differences in lung volumes. No displaced rib fractures. No focal airspace opacity. There are prominent bilateral interstitial opacities. Visualized upper abdomen is unremarkable. IMPRESSION: Prominent bilateral interstitial opacities could represent pulmonary edema or atypical infection. No focal airspace opacity to suggest pneumonia. Electronically Signed   By: Lorenza Cambridge M.D.   On: 07/28/2022 13:21      Assessment/Plan This is a 31 yo male presenting with nausea, vomiting and diffuse abdominal pain. I have personally reviewed his labs, imaging and notes. He does have mild pericholecystic fluid, but no gallstones, and his clinical presentation is not consistent with acute cholecystitis. He is afebrile with no leukocytosis. Acalculous cholecystitis would be unusual in this setting. I am not able to perform a full abdominal exam as he refused this, but other providers report he has no focal tenderness in the RUQ. I think it is unlikely his gallbladder is the source of his current symptoms and do not recommend cholecystectomy at this time. Recommend a PO challenge and symptomatic treatment. I discussed this plan with the patient.   Sophronia Simas, MD [facility] Surgery General, Hepatobiliary and Pancreatic Surgery 07/28/22 10:19 PM

## 2022-07-28 NOTE — ED Triage Notes (Signed)
Pt complaining of Chest pain and belly pain, diaphoretic, sense of impending doom. Larey Seat and hit his head.  38 HR 150/100 CBG 117 100%  4mg  of zofran

## 2022-07-28 NOTE — ED Notes (Signed)
Patient transported to CT 

## 2022-07-29 ENCOUNTER — Inpatient Hospital Stay (HOSPITAL_COMMUNITY): Payer: 59

## 2022-07-29 DIAGNOSIS — Z79899 Other long term (current) drug therapy: Secondary | ICD-10-CM | POA: Diagnosis not present

## 2022-07-29 DIAGNOSIS — R42 Dizziness and giddiness: Secondary | ICD-10-CM | POA: Diagnosis not present

## 2022-07-29 DIAGNOSIS — B1920 Unspecified viral hepatitis C without hepatic coma: Secondary | ICD-10-CM | POA: Diagnosis not present

## 2022-07-29 DIAGNOSIS — Z886 Allergy status to analgesic agent status: Secondary | ICD-10-CM | POA: Diagnosis not present

## 2022-07-29 DIAGNOSIS — F1210 Cannabis abuse, uncomplicated: Secondary | ICD-10-CM | POA: Diagnosis not present

## 2022-07-29 DIAGNOSIS — W19XXXA Unspecified fall, initial encounter: Secondary | ICD-10-CM

## 2022-07-29 DIAGNOSIS — R569 Unspecified convulsions: Secondary | ICD-10-CM | POA: Diagnosis not present

## 2022-07-29 DIAGNOSIS — F17210 Nicotine dependence, cigarettes, uncomplicated: Secondary | ICD-10-CM | POA: Diagnosis not present

## 2022-07-29 DIAGNOSIS — F419 Anxiety disorder, unspecified: Secondary | ICD-10-CM | POA: Diagnosis not present

## 2022-07-29 DIAGNOSIS — R932 Abnormal findings on diagnostic imaging of liver and biliary tract: Secondary | ICD-10-CM | POA: Diagnosis not present

## 2022-07-29 DIAGNOSIS — M549 Dorsalgia, unspecified: Secondary | ICD-10-CM | POA: Diagnosis not present

## 2022-07-29 DIAGNOSIS — Z6822 Body mass index (BMI) 22.0-22.9, adult: Secondary | ICD-10-CM | POA: Diagnosis not present

## 2022-07-29 DIAGNOSIS — R001 Bradycardia, unspecified: Secondary | ICD-10-CM | POA: Diagnosis not present

## 2022-07-29 DIAGNOSIS — R079 Chest pain, unspecified: Secondary | ICD-10-CM | POA: Diagnosis not present

## 2022-07-29 DIAGNOSIS — R112 Nausea with vomiting, unspecified: Secondary | ICD-10-CM | POA: Diagnosis present

## 2022-07-29 DIAGNOSIS — Z1152 Encounter for screening for COVID-19: Secondary | ICD-10-CM | POA: Diagnosis not present

## 2022-07-29 DIAGNOSIS — R7989 Other specified abnormal findings of blood chemistry: Secondary | ICD-10-CM | POA: Diagnosis not present

## 2022-07-29 DIAGNOSIS — Z888 Allergy status to other drugs, medicaments and biological substances status: Secondary | ICD-10-CM | POA: Diagnosis not present

## 2022-07-29 DIAGNOSIS — F1410 Cocaine abuse, uncomplicated: Secondary | ICD-10-CM | POA: Diagnosis not present

## 2022-07-29 DIAGNOSIS — R4182 Altered mental status, unspecified: Secondary | ICD-10-CM | POA: Diagnosis not present

## 2022-07-29 DIAGNOSIS — F1123 Opioid dependence with withdrawal: Secondary | ICD-10-CM | POA: Diagnosis not present

## 2022-07-29 DIAGNOSIS — E440 Moderate protein-calorie malnutrition: Secondary | ICD-10-CM | POA: Diagnosis not present

## 2022-07-29 DIAGNOSIS — F1310 Sedative, hypnotic or anxiolytic abuse, uncomplicated: Secondary | ICD-10-CM | POA: Diagnosis not present

## 2022-07-29 DIAGNOSIS — F1290 Cannabis use, unspecified, uncomplicated: Secondary | ICD-10-CM

## 2022-07-29 LAB — RESPIRATORY PANEL BY PCR

## 2022-07-29 LAB — HIV ANTIBODY (ROUTINE TESTING W REFLEX): HIV Screen 4th Generation wRfx: NONREACTIVE

## 2022-07-29 LAB — RPR: RPR Ser Ql: NONREACTIVE

## 2022-07-29 LAB — TSH: TSH: 0.033 u[IU]/mL — ABNORMAL LOW (ref 0.350–4.500)

## 2022-07-29 LAB — PHOSPHORUS: Phosphorus: 2.5 mg/dL (ref 2.5–4.6)

## 2022-07-29 LAB — T4, FREE: Free T4: 0.97 ng/dL (ref 0.61–1.12)

## 2022-07-29 MED ORDER — BUPRENORPHINE HCL-NALOXONE HCL 8-2 MG SL SUBL
1.0000 | SUBLINGUAL_TABLET | Freq: Two times a day (BID) | SUBLINGUAL | Status: DC
  Administered 2022-07-30 – 2022-07-31 (×2): 1 via SUBLINGUAL
  Filled 2022-07-29 (×2): qty 1

## 2022-07-29 MED ORDER — LORAZEPAM 2 MG/ML IJ SOLN
INTRAMUSCULAR | Status: AC
  Filled 2022-07-29: qty 1

## 2022-07-29 MED ORDER — ALPRAZOLAM 0.25 MG PO TABS
0.2500 mg | ORAL_TABLET | Freq: Two times a day (BID) | ORAL | Status: DC | PRN
  Administered 2022-07-29 – 2022-07-30 (×3): 0.25 mg via ORAL
  Filled 2022-07-29 (×3): qty 1

## 2022-07-29 MED ORDER — ONDANSETRON HCL 4 MG/2ML IJ SOLN
4.0000 mg | Freq: Four times a day (QID) | INTRAMUSCULAR | Status: DC
  Administered 2022-07-29 (×3): 4 mg via INTRAVENOUS
  Filled 2022-07-29 (×4): qty 2

## 2022-07-29 MED ORDER — NICOTINE 14 MG/24HR TD PT24
14.0000 mg | MEDICATED_PATCH | Freq: Every day | TRANSDERMAL | Status: DC
  Administered 2022-07-29 – 2022-07-31 (×3): 14 mg via TRANSDERMAL
  Filled 2022-07-29 (×3): qty 1

## 2022-07-29 MED ORDER — GABAPENTIN 300 MG PO CAPS
300.0000 mg | ORAL_CAPSULE | Freq: Two times a day (BID) | ORAL | Status: DC
  Administered 2022-07-29 – 2022-07-31 (×5): 300 mg via ORAL
  Filled 2022-07-29 (×5): qty 1

## 2022-07-29 MED ORDER — ENOXAPARIN SODIUM 40 MG/0.4ML IJ SOSY
40.0000 mg | PREFILLED_SYRINGE | INTRAMUSCULAR | Status: DC
  Filled 2022-07-29 (×3): qty 0.4

## 2022-07-29 MED ORDER — HYDROCODONE-ACETAMINOPHEN 5-325 MG PO TABS
1.0000 | ORAL_TABLET | Freq: Four times a day (QID) | ORAL | Status: DC | PRN
  Administered 2022-07-29 – 2022-07-30 (×4): 1 via ORAL
  Filled 2022-07-29 (×4): qty 1

## 2022-07-29 MED ORDER — BUPRENORPHINE HCL-NALOXONE HCL 2-0.5 MG SL SUBL
1.0000 | SUBLINGUAL_TABLET | SUBLINGUAL | Status: AC | PRN
  Administered 2022-07-29 (×2): 1 via SUBLINGUAL
  Filled 2022-07-29 (×2): qty 1

## 2022-07-29 MED ORDER — LORAZEPAM 2 MG/ML IJ SOLN
0.5000 mg | Freq: Three times a day (TID) | INTRAMUSCULAR | Status: DC | PRN
  Administered 2022-07-29: 0.5 mg via INTRAVENOUS

## 2022-07-29 MED ORDER — HYDROXYZINE HCL 25 MG PO TABS
25.0000 mg | ORAL_TABLET | Freq: Four times a day (QID) | ORAL | Status: DC | PRN
  Administered 2022-07-29: 25 mg via ORAL
  Filled 2022-07-29: qty 1

## 2022-07-29 MED ORDER — BUPRENORPHINE HCL-NALOXONE HCL 8-2 MG SL SUBL
1.0000 | SUBLINGUAL_TABLET | Freq: Every day | SUBLINGUAL | Status: DC
  Administered 2022-07-29: 1 via SUBLINGUAL
  Filled 2022-07-29: qty 1

## 2022-07-29 MED ORDER — ACETAMINOPHEN 325 MG PO TABS
650.0000 mg | ORAL_TABLET | Freq: Four times a day (QID) | ORAL | Status: DC | PRN
  Administered 2022-07-29 – 2022-07-30 (×2): 650 mg via ORAL
  Filled 2022-07-29 (×3): qty 2

## 2022-07-29 MED ORDER — MORPHINE SULFATE (PF) 2 MG/ML IV SOLN
1.0000 mg | INTRAVENOUS | Status: DC | PRN
  Administered 2022-07-29 – 2022-07-30 (×3): 1 mg via INTRAVENOUS
  Filled 2022-07-29 (×4): qty 1

## 2022-07-29 MED ORDER — ACETAMINOPHEN 650 MG RE SUPP: 650.0000 mg | Freq: Four times a day (QID) | RECTAL | Status: DC | PRN

## 2022-07-29 MED ORDER — LIDOCAINE 5 % EX PTCH
1.0000 | MEDICATED_PATCH | CUTANEOUS | Status: DC
  Administered 2022-07-29 – 2022-07-30 (×2): 1 via TRANSDERMAL
  Filled 2022-07-29 (×2): qty 1

## 2022-07-29 MED ORDER — LACTATED RINGERS IV SOLN
INTRAVENOUS | Status: DC
  Administered 2022-07-30: 1000 mL via INTRAVENOUS

## 2022-07-29 MED ORDER — BUPRENORPHINE HCL-NALOXONE HCL 8-2 MG SL SUBL: 1.0000 | SUBLINGUAL_TABLET | Freq: Two times a day (BID) | SUBLINGUAL | Status: DC

## 2022-07-29 NOTE — Significant Event (Signed)
Patient called RN into room shortly after arriving onto unit because he "felt a seizure coming on." RN entered room with CNA [place]. Patient could not explain what he was feeling. RN messaged MD on call. After several minutes the patient rolled onto his right side and thrashed with his arms up for less than 5 seconds, stopping himself from falling off the bed on the rail. He rolled back over on his back and asked what happened. Vitals were stable. Ativan was ordered and given. MD informed of incident. EEG performed. No further orders. Will continue to monitor.

## 2022-07-29 NOTE — ED Provider Notes (Signed)
  Physical Exam  BP (!) 143/92   Pulse (!) 58   Temp 99.4 F (37.4 C) (Oral)   Resp (!) 21   Ht 6\' 2"  (1.88 m)   Wt 80.3 kg   SpO2 96%   BMI 22.73 kg/m   Physical Exam Constitutional:      General: He is not in acute distress.    Appearance: Normal appearance.  HENT:     Head: Normocephalic and atraumatic.     Nose: No congestion or rhinorrhea.  Eyes:     General:        Right eye: No discharge.        Left eye: No discharge.     Extraocular Movements: Extraocular movements intact.     Pupils: Pupils are equal, round, and reactive to light.  Cardiovascular:     Rate and Rhythm: Normal rate and regular rhythm.     Heart sounds: No murmur heard. Pulmonary:     Effort: No respiratory distress.     Breath sounds: No wheezing or rales.  Abdominal:     General: There is no distension.     Tenderness: There is abdominal tenderness.  Musculoskeletal:        General: Normal range of motion.     Cervical back: Normal range of motion.  Skin:    General: Skin is warm and dry.  Neurological:     General: No focal deficit present.     Mental Status: He is alert.     Procedures  Procedures  ED Course / MDM    Medical Decision Making Amount and/or Complexity of Data Reviewed Labs: ordered. Radiology: ordered.  Risk Prescription drug management. Decision regarding hospitalization.   Patient received an handoff.  Abdominal pain nausea and vomiting pending antiemetics and reevaluation.  Patient did have some pericholecystic fluid but was evaluated by surgery at bedside who has lower suspicion for acute cholecystitis at this time.  I had a long discussion with the patient and I think his symptoms are likely multifactorial and given his daily marijuana use and frequent opioid use, there may be an element of opioid withdrawal for cyclic vomiting syndrome.  Patient is having persistent bradycardia and is likely a poor droperidol candidate, but the patient requested to try  Suboxone here in the ER.  He did have some mild improvement of his symptoms with the Suboxone but is still unable to tolerate p.o. and will require hospital admission for abdominal pain nausea vomiting and inability tolerate p.o.       , MD 07/29/22 [phone]

## 2022-07-29 NOTE — Progress Notes (Signed)
EEG complete - results pending 

## 2022-07-29 NOTE — H&P (Addendum)
PCP:   Patient, [name]   Chief Complaint:  Nausea and vomiting  HPI: This 31 year old male with no significant past medical history.  He does have polysubstance abuse, THC and cocaine.  He presents tonight with intractable nausea and vomiting and abdominal pain that has been ongoing for 1 day.  He came to the ER.   In the ER he has received IV Zofran, and Phenergan without improvement..  CT abdomen pelvis showed pericholecystic fluids.  Surgery has seen patient, does not believe patient has acute gallbladder.  The hospitalist group has been asked to admit for intractable nausea and vomiting  Patient is a poor historian, answering very few questions.   Review of Systems:  The patient denies anorexia, fever, weight loss,, vision loss, decreased hearing, hoarseness, chest pain, syncope, dyspnea on exertion, peripheral edema, balance deficits, hemoptysis, abdominal pain, melena, hematochezia, severe indigestion/heartburn, hematuria, incontinence, genital sores, muscle weakness, suspicious skin lesions, transient blindness, difficulty walking, depression, unusual weight change, abnormal bleeding, enlarged lymph nodes, angioedema, and breast masses. Positives: Nausea vomiting, abdominal pain  Past Medical History: Past Medical History:  Diagnosis Date   Anxiety    Asthma    Fever and positive blood culture 01/31/2022   Seizure-like activity (HCC) 01/30/2022   Seizures (HCC)    Substance abuse (HCC)    Past Surgical History:  Procedure Laterality Date   I & D EXTREMITY Right 12/15/2021   Procedure: IRRIGATION AND DEBRIDEMENT RIGHT HAND AND SHOULDER;  Surgeon: Bradly Bienenstock, MD;  Location: [facility];  Service: Orthopedics;  Laterality: Right;  upper and lower arm   TEE WITHOUT CARDIOVERSION N/A 12/21/2021   Procedure: TRANSESOPHAGEAL ECHOCARDIOGRAM (TEE);  Surgeon: Little Ishikawa, MD;  Location: [facility] ENDOSCOPY;  Service: Cardiovascular;  Laterality: N/A;    Medications: Prior to  Admission medications   Medication Sig Start Date End Date Taking? Authorizing Provider  acetaminophen (TYLENOL) 500 MG tablet Take 500 mg by mouth every 6 (six) hours as needed for moderate pain. Patient not taking: Reported on 01/30/2022    [provider]  gabapentin (NEURONTIN) 300 MG capsule Take 1 capsule (300 mg total) by mouth 2 (two) times daily. 02/06/22   Clydie Braun, MD  hydrOXYzine (ATARAX) 25 MG tablet Take 1 tablet (25 mg total) by mouth every 6 (six) hours as needed for anxiety. 02/06/22   Madelyn Flavors A, MD  nicotine (NICODERM CQ - DOSED IN MG/24 HOURS) 21 mg/24hr patch Place 1 patch (21 mg total) onto the skin daily. 02/06/22   Clydie Braun, MD  nicotine polacrilex (NICORETTE) 2 MG gum Take 1 each (2 mg total) by mouth as needed for smoking cessation. 02/06/22   Clydie Braun, MD    Allergies:   Allergies  Allergen Reactions   Ibuprofen Anaphylaxis   Keppra [Levetiracetam] Anaphylaxis   Wasp Venom Protein Swelling    hives   Nitroglycerin Other (See Comments)    Pt reports "real bad chest pain"    Social History:  reports that he has been smoking cigarettes. He has a 17.00 pack-year smoking history. He has been exposed to tobacco smoke. He has never used smokeless tobacco. He reports current drug use. Drug: IV. He reports that he does not drink alcohol.  Family History: None  Physical Exam: Vitals:   07/29/22 0130 07/29/22 0200 07/29/22 0230 07/29/22 0322  BP: 118/66 (!) 159/99 (!) 142/87   Pulse: 73 62 62   Resp: 15 (!) 27 (!) 23   Temp:  99.2 F (37.3 C)  TempSrc:    Oral  SpO2: 95% 99% 97%   Weight:      Height:        General:  Alert and oriented times three, well developed and nourished, unwell appearing male Eyes: PERRLA, pink conjunctiva, no scleral icterus ENT: Dry oral mucosa, neck supple, no thyromegaly Lungs: clear to ascultation, no wheeze, no crackles, no use of accessory muscles Cardiovascular: Bradycardic, regular rate  and rhythm, no regurgitation, no gallops, no murmurs. No carotid bruits, no JVD Abdomen: soft, positive BS, generalized TTP, greatest over suprapubic region, non-distended, no organomegaly, not an acute abdomen GU: not examined Neuro: CN II - XII grossly intact, sensation intact Musculoskeletal: strength 5/5 all extremities, no clubbing, cyanosis or edema Skin: no rash, no subcutaneous crepitation, no decubitus Psych: Quite patient   Labs on Admission:  Recent Labs    07/28/22 1248 07/28/22 1249  NA 143 141  144  K 3.5 3.5  3.5  CL 106 110  CO2  --  25  GLUCOSE 106* 112*  BUN 7 8  CREATININE 0.70 0.83  CALCIUM  --  8.1*  MG  --  1.7   Recent Labs    07/28/22 1249  AST 24  ALT 17  ALKPHOS 47  BILITOT 0.5  PROT 6.5  ALBUMIN 3.3*    Recent Labs    07/28/22 1248 07/28/22 1249  WBC  --  6.9  NEUTROABS  --  4.7  HGB 11.2* 11.0*  10.9*  HCT 33.0* 32.9*  32.0*  MCV  --  85.7  PLT  --  249    Recent Labs    07/28/22 1250  TSH 0.056*    Radiological Exams on Admission: US Abdomen Limited RUQ (LIVER/GB)  Result Date: 07/28/2022 CLINICAL DATA:  Abdominal pain. EXAM: ULTRASOUND ABDOMEN LIMITED RIGHT UPPER QUADRANT COMPARISON:  None Available. FINDINGS: Gallbladder: No gallstones are visualized. The gallbladder wall measures 2.98 mm in thickness. A mild amount of pericholecystic fluid is seen. No sonographic Murphy sign noted by sonographer. Common bile duct: Diameter: 1.96 mm Liver: No focal lesion identified. Within normal limits in parenchymal echogenicity. Portal vein is patent on color Doppler imaging with normal direction of blood flow towards the liver. Other: None. IMPRESSION: Pericholecystic fluid and mild gallbladder wall thickening, without evidence of cholelithiasis or acute cholecystitis. Electronically Signed   By: Aram Candela M.D.   On: 07/28/2022 21:05   CT ABDOMEN PELVIS W CONTRAST  Result Date: 07/28/2022 CLINICAL DATA:  Abdominal pain,  nausea, vomiting, altered mental status. Bradycardia. EXAM: CT ABDOMEN AND PELVIS WITH CONTRAST TECHNIQUE: Multidetector CT imaging of the abdomen and pelvis was performed using the standard protocol following bolus administration of intravenous contrast. RADIATION DOSE REDUCTION: This exam was performed according to the departmental dose-optimization program which includes automated exposure control, adjustment of the mA and/or kV according to patient size and/or use of iterative reconstruction technique. CONTRAST:  75 mL Omnipaque 350non-ionic IV contrast COMPARISON:  01/30/2022 FINDINGS: Lower chest: No acute abnormality. Minimal dependent basilar subsegmental atelectasis. Hepatobiliary: No focal liver abnormality is seen. No gallstones, gallbladder wall thickening, or biliary dilatation. Pericholecystic fluid is noted. If there is concern cholecystitis consider ultrasound correlation. Pancreas: Unremarkable. No pancreatic ductal dilatation or surrounding inflammatory changes. Spleen: Normal in size without focal abnormality. Adrenals/Urinary Tract: Adrenal glands are unremarkable. Kidneys are normal, without renal calculi, focal lesion, or hydronephrosis. Bladder is unremarkable. Stomach/Bowel: Stomach is within normal limits. Appendix appears normal. No evidence of bowel wall  thickening, distention, or inflammatory changes. Vascular/Lymphatic: No significant vascular findings are present. No enlarged abdominal or pelvic lymph nodes. Reproductive: The prostate is unremarkable. Other: No abdominal wall hernia or abnormality. No abdominopelvic ascites. Musculoskeletal: No acute or significant osseous findings. IMPRESSION: Pericholecystic fluid. Otherwise no acute abdominal or pelvic pathology identified. Electronically Signed   By: Layla Maw M.D.   On: 07/28/2022 18:41   CT HEAD WO CONTRAST ( )  Result Date: 07/28/2022 CLINICAL DATA:  Head trauma EXAM: CT HEAD WITHOUT CONTRAST TECHNIQUE: Contiguous  axial images were obtained from the base of the skull through the vertex without intravenous contrast. RADIATION DOSE REDUCTION: This exam was performed according to the departmental dose-optimization program which includes automated exposure control, adjustment of the mA and/or kV according to patient size and/or use of iterative reconstruction technique. COMPARISON:  02/01/2022 FINDINGS: Brain: No evidence of acute infarction, hemorrhage, hydrocephalus, extra-axial collection or mass lesion/mass effect. Vascular: No hyperdense vessel or unexpected calcification. Skull: Normal. Negative for fracture or focal lesion. Sinuses/Orbits: No acute finding. IMPRESSION: No acute intracranial process. Electronically Signed   By: Layla Maw M.D.   On: 07/28/2022 18:37   DG Chest Portable 1 View  Result Date: 07/28/2022 CLINICAL DATA:  Altered mental status EXAM: PORTABLE CHEST 1 VIEW COMPARISON:  CXR 01/30/22 FINDINGS: Pleural effusion. No pneumothorax. Unchanged cardiac and mediastinal contours when accounting for differences in lung volumes. No displaced rib fractures. No focal airspace opacity. There are prominent bilateral interstitial opacities. Visualized upper abdomen is unremarkable. IMPRESSION: Prominent bilateral interstitial opacities could represent pulmonary edema or atypical infection. No focal airspace opacity to suggest pneumonia. Electronically Signed   By: Lorenza Cambridge M.D.   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  This has improved, however monitoring on  telemetry for the next 24 hours. -Magnesium and phosphorus levels ordered  Pericholecystic fluid -Surgery on board.  Per surgery, He does have mild pericholecystic fluid, but no gallstones, and his clinical presentation is not consistent with acute cholecystitis. He is afebrile with no leukocytosis. Acalculous cholecystitis would be unusual in this setting    Anxiety -Monitoring   Hepatitis C infection  Aware   Malnutrition of moderate degree   Stephen Shepard 07/29/2022, 4:30 AM

## 2022-07-29 NOTE — Procedures (Signed)
Patient Name: Stephen Shepard  MRN: [id number]  Epilepsy Attending: Charlsie Quest  Referring Physician/Provider: Lanae Boast, MD  Date: 07/29/2022 Duration: 27.11 mins  Patient history: 31yo M getting eeg to evaluate fro seizure like activity.   Level of alertness: Awake  AEDs during EEG study: Ativan  Technical aspects: This EEG study was done with scalp electrodes positioned according to the 10-20 International system of electrode placement. Electrical activity was reviewed with band pass filter of 1-70Hz , sensitivity of 7 uV/mm, display speed of 10mm/sec with a 60Hz  notched filter applied as appropriate. EEG data were recorded continuously and digitally stored.  Video monitoring was available and reviewed as appropriate.  Description: The posterior dominant rhythm consists of 8Hz  activity of moderate voltage (25-35 uV) seen predominantly in posterior head regions, symmetric and reactive to eye opening and eye closing. There is an excessive amount of 15 to 18 Hz beta activity distributed symmetrically and diffusely.  Hyperventilation and photic stimulation were not performed.     ABNORMALITY - Excessive beta, generalized  IMPRESSION: This study is within normal limits. The excessive beta activity seen in the background is most likely due to the effect of benzodiazepine and is a benign EEG pattern. No seizures or epileptiform discharges were seen throughout the recording.  A normal interictal EEG does not exclude the diagnosis of epilepsy.  Denver Harder 

## 2022-07-29 NOTE — Progress Notes (Signed)
Patient seen and examined personally, I reviewed the chart, history and physical and admission note, done by admitting physician this morning and agree with the same with following addendum.  Please refer to the morning admission note for more detailed plan of care.  Briefly,  31 year old male with history of polysubstance abuse THC and cocaine admitted with intractable nausea vomiting abdominal pain, and also feverish. In the ED CT abdomen pelvis with pericholecystic fluid surgery was consulted does not feel acute gallbladder, also underwent ultrasound of gallbladder, patient failed oral challenge admitted for intractable nausea vomiting. He had low-grade fever 100.6, labs with fairly stable CMP, CBC negative troponin x 2 and normal lactic acid, COVID-19 negative.  UDS positive for benzo cocaine and THC.  CT head no acute finding. Chest x-ray " prominent bilateral interstitial opacities could represent pulmonary edema or atypical infection. No focal airspace opacity to suggest Pneumonia".  Seen this am: C/o pain on left abdomen and left thigh area,Feels feverish Having nausea.Wants to sleep asking ativan, wants to eat food as well.  Issues: Intractable nausea vomiting Polysubstance abuse/UDS positive for THC cocaine benzo ?Cannabis hyperemesis Possible opiate withdrawal w/ IVDA: Continue IV fluid hydration, antiemetics, add PPI, supportive care, COWS scale with Suboxone.  Pericholecystic fluid in CT seen by surgery-does not patient has acute gallbladder. Anxiety: low-dose Xanax History hepatitis C infection Bradycardia-stable Low-grade fever, check respiratory virus panel.  Chest x-ray reviewed no cough or respiratory symptoms.

## 2022-07-30 ENCOUNTER — Inpatient Hospital Stay (HOSPITAL_COMMUNITY): Payer: 59

## 2022-07-30 DIAGNOSIS — R112 Nausea with vomiting, unspecified: Secondary | ICD-10-CM | POA: Diagnosis not present

## 2022-07-30 LAB — CBC WITH DIFFERENTIAL/PLATELET
Abs Immature Granulocytes: 0.04 10*3/uL (ref 0.00–0.07)
Basophils Absolute: 0 10*3/uL (ref 0.0–0.1)
Basophils Relative: 0 %
Eosinophils Absolute: 0 10*3/uL (ref 0.0–0.5)
Eosinophils Relative: 0 %
HCT: 37.7 % — ABNORMAL LOW (ref 39.0–52.0)
Hemoglobin: 12.9 g/dL — ABNORMAL LOW (ref 13.0–17.0)
Immature Granulocytes: 0 %
Lymphocytes Relative: 26 %
Lymphs Abs: 2.6 10*3/uL (ref 0.7–4.0)
MCH: 28.6 pg (ref 26.0–34.0)
MCHC: 34.2 g/dL (ref 30.0–36.0)
MCV: 83.6 fL (ref 80.0–100.0)
Monocytes Absolute: 1.2 10*3/uL — ABNORMAL HIGH (ref 0.1–1.0)
Monocytes Relative: 12 %
Neutro Abs: 6 10*3/uL (ref 1.7–7.7)
Neutrophils Relative %: 62 %
Platelets: 351 10*3/uL (ref 150–400)
RBC: 4.51 MIL/uL (ref 4.22–5.81)
RDW: 14.5 % (ref 11.5–15.5)
WBC: 9.9 10*3/uL (ref 4.0–10.5)
nRBC: 0 % (ref 0.0–0.2)

## 2022-07-30 LAB — MAGNESIUM: Magnesium: 2 mg/dL (ref 1.7–2.4)

## 2022-07-30 LAB — BASIC METABOLIC PANEL
Anion gap: 10 (ref 5–15)
BUN: 10 mg/dL (ref 6–20)
CO2: 25 mmol/L (ref 22–32)
Calcium: 8.9 mg/dL (ref 8.9–10.3)
Chloride: 105 mmol/L (ref 98–111)
Creatinine, Ser: 1.25 mg/dL — ABNORMAL HIGH (ref 0.61–1.24)
GFR, Estimated: >60 mL/min (ref >60)
Glucose, Bld: 139 mg/dL — ABNORMAL HIGH (ref 70–99)
Potassium: 3.4 mmol/L — ABNORMAL LOW (ref 3.5–5.1)
Sodium: 140 mmol/L (ref 135–145)

## 2022-07-30 MED ORDER — TRAZODONE HCL 50 MG PO TABS
50.0000 mg | ORAL_TABLET | ORAL | Status: AC
  Administered 2022-07-30: 50 mg via ORAL
  Filled 2022-07-30: qty 1

## 2022-07-30 MED ORDER — POTASSIUM CHLORIDE CRYS ER 20 MEQ PO TBCR
20.0000 meq | EXTENDED_RELEASE_TABLET | Freq: Once | ORAL | Status: AC
  Administered 2022-07-30: 20 meq via ORAL
  Filled 2022-07-30: qty 1

## 2022-07-30 MED ORDER — OXYCODONE HCL 5 MG PO TABS
5.0000 mg | ORAL_TABLET | ORAL | Status: AC
  Administered 2022-07-30: 5 mg via ORAL
  Filled 2022-07-30: qty 1

## 2022-07-30 NOTE — Progress Notes (Signed)
PROGRESS NOTE Stephen Shepard  WUJ:[id number] DOB: 1991-07-10 DOA: 07/28/2022 PCP: Patient, [name]   Brief Narrative/Hospital Course: No notes on file    Subjective: Seen and examined this morning.  He is resting comfortably today some back pain no nausea vomiting tolerating some food still complains of not getting enough sleep or rest. Yesterday evening he had shakiness but patient was aware prevented himself from fall ( see rn note); EEG negative for seizure. Overnight chest pain-no complaint this morning  Assessment and Plan: Principal Problem:   Intractable nausea and vomiting Active Problems:   Hepatitis C infection   IVDU (intravenous drug user)   Anxiety   Tobacco abuse   Malnutrition of moderate degree   Opioid withdrawal (HCC)  Intractable nausea vomiting Polysubstance abuse/UDS positive for THC cocaine benzo ?Cannabis hyperemesis Possible opiate withdrawal w/ IVDA hx: Patient denies any current IV drug abuse in the past 6 months or so, admits he was taking Percocet that "someone just gave it to him".  Symptoms clinically improving with IV fluid hydration supportive care PPI, suboxone. Pain control.  Denies any nausea and vomiting.  Encourage oral intake and hydration.  Complains of being dizzy on standing will check orthostatics with PT OT evaluation today  Mildly elevated creatinine previous baseline creatinine ranging from 1-1.3, continue IV fluid hydration Recent Labs    12/25/21 0638 12/28/21 0827 01/30/22 0627 01/31/22 0728 02/01/22 0504 02/02/22 0535 02/03/22 1919 02/04/22 0554 02/05/22 0528 02/06/22 0514 07/28/22 1248 07/28/22 1249 07/30/22 0256  BUN 27* 29* 20 20 23* 20 15  --   --   --  7 8 10   CREATININE 0.81 0.93 1.23 1.31* 1.13 0.98 1.08 1.01 1.07 1.16 0.70 0.83 1.25*    Mild hypokalemia replaced  Pericholecystic fluid in CT seen by surgery-does not feel patient has acute gallbladder.  Some generalized abdominal discomfort  present  Anxiety:cont  low-dose Xanax home Neurontin Insomnia continue trazodone History hepatitis C infection Bradycardia-stable HR Low-grade fever, checked respiratory virus panel and negative.CXR reviewed no cough or respiratory symptoms ?  Patient describing "felt a seizure coming on"> RN witnessed the patient rolled onto his right side and thrust with his arms up for less than 5 seconds stopping himself from falling off the bed on the rail.  Doubt seizure.previous similar episodes extensive workup in July unremarkable for seizure.Monitor.  DVT prophylaxis: enoxaparin (LOVENOX) injection 40 mg Start: 07/29/22 1000 SCDs Start: 07/29/22 07/31/22 Code Status:   Code Status: Full Code Family Communication: plan of care discussed with patient at bedside. Patient status is: Inpatient because of notable nausea and vomiting Level of care: Telemetry Medical   Dispo: The patient is from: home            Anticipated disposition: home 1 day   Objective: Vitals last 24 hrs: Vitals:   07/29/22 1940 07/29/22 2355 07/30/22 0212 07/30/22 0315  BP: (!) 133/91 130/89 (!) 144/98 (!) 140/93  Pulse: 62 62 (!) 59 65  Resp:  19 11 19   Temp: 100.2 F (37.9 C) 98.6 F (37 C)  99.3 F (37.4 C)  TempSrc: Oral Oral  Oral  SpO2: 96% 94% 97% 96%  Weight:      Height:       Weight change:   Physical Examination: General exam: alert awake, older than stated age HEENT:Oral mucosa moist, Ear/Nose WNL grossly Respiratory system: bilaterally clear BS, no use of accessory muscle Cardiovascular system: S1 & S2 +, No JVD. Gastrointestinal system: Abdomen soft,NT,ND, BS+ Nervous System:Alert,  awake, moving extremities. Extremities: LE edema neg,distal peripheral pulses palpable.  Skin: No rashes,no icterus. MSK: Normal muscle bulk,tone, power  Medications reviewed:  Scheduled Meds:  buprenorphine-naloxone  1 tablet Sublingual BID   enoxaparin (LOVENOX) injection  40 mg Subcutaneous Q24H   gabapentin  300  mg Oral BID   lidocaine  1 patch Transdermal Q24H   nicotine  14 mg Transdermal Daily   ondansetron (ZOFRAN) IV  4 mg Intravenous Q6H   Continuous Infusions:  lactated ringers 150 mL/hr at 07/30/22 0615   promethazine (PHENERGAN) injection (IM or IVPB) Stopped (07/28/22 2344)    Diet Order             Diet regular Room service appropriate? Yes; Fluid consistency: Thin  Diet effective now                  Intake/Output Summary (Last 24 hours) at 07/30/2022 0848 Last data filed at 07/30/2022 0615 Gross per 24 hour  Intake 1242.65 ml  Output 700 ml  Net 542.65 ml   Net IO Since Admission: 1,590.65 mL [07/30/22 0848]  Wt Readings from Last 3 Encounters:  07/28/22 80.3 kg  01/30/22 74.8 kg  12/21/21 64.3 kg     Unresulted Labs (From admission, onward)     Start     Ordered   08/05/22 0500  Creatinine, serum  (enoxaparin (LOVENOX)    CrCl >/= 30 ml/min)  Weekly,   R     Comments: while on enoxaparin therapy    07/29/22 0522          Data Reviewed: I have personally reviewed following labs and imaging studies CBC: Recent Labs  Lab 07/28/22 1248 07/28/22 1249 07/30/22 0256  WBC  --  6.9 9.9  NEUTROABS  --  4.7 6.0  HGB 11.2* 11.0*  10.9* 12.9*  HCT 33.0* 32.9*  32.0* 37.7*  MCV  --  85.7 83.6  PLT  --  249 351   Basic Metabolic Panel: Recent Labs  Lab 07/28/22 1248 07/28/22 1249 07/29/22 0730 07/30/22 0256  NA 143 141  144  --  140  K 3.5 3.5  3.5  --  3.4*  CL 106 110  --  105  CO2  --  25  --  25  GLUCOSE 106* 112*  --  139*  BUN 7 8  --  10  CREATININE 0.70 0.83  --  1.25*  CALCIUM  --  8.1*  --  8.9  MG  --  1.7  --  2.0  PHOS  --   --  2.5  --    GFR: Estimated Creatinine Clearance: 95.5 mL/min (A) (by C-G formula based on SCr of 1.25 mg/dL (H)). Liver Function Tests: Recent Labs  Lab 07/28/22 1249  AST 24  ALT 17  ALKPHOS 47  BILITOT 0.5  PROT 6.5  ALBUMIN 3.3*   No results for input(s): "LIPASE", "AMYLASE" in the last 168  hours. Recent Labs  Lab 07/28/22 1248  AMMONIA 15  Thyroid Function Tests: Recent Labs    07/29/22 0522 07/29/22 0731  TSH  --  0.033*  FREET4 0.97  --    Sepsis Labs: Recent Labs  Lab 07/28/22 1453  LATICACIDVEN 1.0    Recent Results (from the past 240 hour(s))  Respiratory (~20 pathogens) panel by PCR     Status: None   Collection Time: 07/29/22  9:06 AM   Specimen: Nasopharyngeal Swab; Respiratory  Result Value Ref Range Status   Adenovirus NOT  DETECTED NOT DETECTED Final   Coronavirus 229E NOT DETECTED NOT DETECTED Final    Comment: (NOTE) The Coronavirus on the Respiratory Panel, DOES NOT test for the novel  Coronavirus (2019 nCoV)    Coronavirus HKU1 NOT DETECTED NOT DETECTED Final   Coronavirus NL63 NOT DETECTED NOT DETECTED Final   Coronavirus OC43 NOT DETECTED NOT DETECTED Final   Metapneumovirus NOT DETECTED NOT DETECTED Final   Rhinovirus / Enterovirus NOT DETECTED NOT DETECTED Final   Influenza A NOT DETECTED NOT DETECTED Final   Influenza B NOT DETECTED NOT DETECTED Final   Parainfluenza Virus 1 NOT DETECTED NOT DETECTED Final   Parainfluenza Virus 2 NOT DETECTED NOT DETECTED Final   Parainfluenza Virus 3 NOT DETECTED NOT DETECTED Final   Parainfluenza Virus 4 NOT DETECTED NOT DETECTED Final   Respiratory Syncytial Virus NOT DETECTED NOT DETECTED Final   Bordetella pertussis NOT DETECTED NOT DETECTED Final   Bordetella Parapertussis NOT DETECTED NOT DETECTED Final   Chlamydophila pneumoniae NOT DETECTED NOT DETECTED Final   Mycoplasma pneumoniae NOT DETECTED NOT DETECTED Final    Comment: Performed at [facility], [address] [place], [place] [address]    Antimicrobials: Anti-infectives (From admission, onward)    None      Culture/Microbiology    Component Value Date/Time   SDES  01/30/2022 1753    BLOOD RIGHT ANTECUBITAL Performed at [facility], [address], [place], [place] [address]    SPECREQUEST   01/30/2022 1753    BOTTLES DRAWN AEROBIC ONLY Blood Culture adequate volume Performed at [facility], [address] [place], [place] [address]    CULT  01/30/2022 1753    NO GROWTH 5 DAYS Performed at [facility], [address], [place], [place] [address]    REPTSTATUS 02/04/2022 FINAL 01/30/2022 1753  Radiology Studies: EEG adult  Result Date: August 21, 2022 Charlsie Quest, MD     21-Aug-2022  4:50 PM Patient Name: Stephen Shepard MRN: [id number] Epilepsy Attending: Charlsie Quest Referring Physician/Provider: Lanae Boast, MD Date: 21-Aug-2022 Duration: 27.11 mins Patient history: 31yo M getting eeg to evaluate fro seizure like activity. Level of alertness: Awake AEDs during EEG study: Ativan Technical aspects: This EEG study was done with scalp electrodes positioned according to the 10-20 International system of electrode placement. Electrical activity was reviewed with band pass filter of 1-70Hz , sensitivity of 7 uV/mm, display speed of 60mm/sec with a 60Hz  notched filter applied as appropriate. EEG data were recorded continuously and digitally stored.  Video monitoring was available and reviewed as appropriate. Description: The posterior dominant rhythm consists of 8Hz  activity of moderate voltage (25-35 uV) seen predominantly in posterior head regions, symmetric and reactive to eye opening and eye closing. There is an excessive amount of 15 to 18 Hz beta activity distributed symmetrically and diffusely. Hyperventilation and photic stimulation were not performed.   ABNORMALITY - Excessive beta, generalized IMPRESSION: This study is within normal limits. The excessive beta activity seen in the background is most likely due to the effect of benzodiazepine and is a benign EEG pattern. No seizures or epileptiform discharges were seen throughout the recording. A normal interictal EEG does not exclude the diagnosis of epilepsy. Priyanka   Abdomen Limited RUQ  (LIVER/GB)  Result Date: 07/28/2022 CLINICAL DATA:  Abdominal pain. EXAM: ULTRASOUND ABDOMEN LIMITED RIGHT UPPER QUADRANT COMPARISON:  None Available. FINDINGS: Gallbladder: No gallstones are visualized. The gallbladder wall measures 2.98 mm in thickness. A mild  amount of pericholecystic fluid is seen. No sonographic Murphy sign noted by sonographer. Common bile duct: Diameter: 1.96 mm Liver: No focal lesion identified. Within normal limits in parenchymal echogenicity. Portal vein is patent on color Doppler imaging with normal direction of blood flow towards the liver. Other: None. IMPRESSION: Pericholecystic fluid and mild gallbladder wall thickening, without evidence of cholelithiasis or acute cholecystitis. Electronically Signed   By: Aram Candela M.D.   On: 07/28/2022 21:05   CT ABDOMEN PELVIS W CONTRAST  Result Date: 07/28/2022 CLINICAL DATA:  Abdominal pain, nausea, vomiting, altered mental status. Bradycardia. EXAM: CT ABDOMEN AND PELVIS WITH CONTRAST TECHNIQUE: Multidetector CT imaging of the abdomen and pelvis was performed using the standard protocol following bolus administration of intravenous contrast. RADIATION DOSE REDUCTION: This exam was performed according to the departmental dose-optimization program which includes automated exposure control, adjustment of the mA and/or kV according to patient size and/or use of iterative reconstruction technique. CONTRAST:  75 mL Omnipaque 350non-ionic IV contrast COMPARISON:  01/30/2022 FINDINGS: Lower chest: No acute abnormality. Minimal dependent basilar subsegmental atelectasis. Hepatobiliary: No focal liver abnormality is seen. No gallstones, gallbladder wall thickening, or biliary dilatation. Pericholecystic fluid is noted. If there is concern cholecystitis consider ultrasound correlation. Pancreas: Unremarkable. No pancreatic ductal dilatation or surrounding inflammatory changes. Spleen: Normal in size without focal abnormality.  Adrenals/Urinary Tract: Adrenal glands are unremarkable. Kidneys are normal, without renal calculi, focal lesion, or hydronephrosis. Bladder is unremarkable. Stomach/Bowel: Stomach is within normal limits. Appendix appears normal. No evidence of bowel wall thickening, distention, or inflammatory changes. Vascular/Lymphatic: No significant vascular findings are present. No enlarged abdominal or pelvic lymph nodes. Reproductive: The prostate is unremarkable. Other: No abdominal wall hernia or abnormality. No abdominopelvic ascites. Musculoskeletal: No acute or significant osseous findings. IMPRESSION: Pericholecystic fluid. Otherwise no acute abdominal or pelvic pathology identified. Electronically Signed   By: Layla Maw M.D.   On: 07/28/2022 18:41   CT HEAD WO CONTRAST ( )  Result Date: 07/28/2022 CLINICAL DATA:  Head trauma EXAM: CT HEAD WITHOUT CONTRAST TECHNIQUE: Contiguous axial images were obtained from the base of the skull through the vertex without intravenous contrast. RADIATION DOSE REDUCTION: This exam was performed according to the departmental dose-optimization program which includes automated exposure control, adjustment of the mA and/or kV according to patient size and/or use of iterative reconstruction technique. COMPARISON:  02/01/2022 FINDINGS: Brain: No evidence of acute infarction, hemorrhage, hydrocephalus, extra-axial collection or mass lesion/mass effect. Vascular: No hyperdense vessel or unexpected calcification. Skull: Normal. Negative for fracture or focal lesion. Sinuses/Orbits: No acute finding. IMPRESSION: No acute intracranial process. Electronically Signed   By: Layla Maw M.D.   On: 07/28/2022 18:37   DG Chest Portable 1 View  Result Date: 07/28/2022 CLINICAL DATA:  Altered mental status EXAM: PORTABLE CHEST 1 VIEW COMPARISON:  CXR 01/30/22 FINDINGS: Pleural effusion. No pneumothorax. Unchanged cardiac and mediastinal contours when accounting for differences in  lung volumes. No displaced rib fractures. No focal airspace opacity. There are prominent bilateral interstitial opacities. Visualized upper abdomen is unremarkable. IMPRESSION: Prominent bilateral interstitial opacities could represent pulmonary edema or atypical infection. No focal airspace opacity to suggest pneumonia. Electronically Signed   By: Lorenza Cambridge M.D.   On: 07/28/2022 13:21     LOS: 1 day   Lanae Boast, MD [facility]  07/30/2022, 8:48 AM

## 2022-07-30 NOTE — Progress Notes (Signed)
Patient states that he is having chest pain that is a 10/10 and he is uncomfortable. Pt's vitals and EKG obtained, both unremarkable. PRN morphine administered to pt, MD on call paged. Pt resting quietly in bed, asymptomatic.

## 2022-07-30 NOTE — Progress Notes (Signed)
Received a call from bedside RN regarding the patient having chest pain.  Presented at bedside.  The patient is having diffuse reproducible chest pain with minimal palpation.  Multiple tender points bilaterally and symmetrically involving chest, back, arms, and legs.  The patient appears in no acute distress.  Vital signs stable, BP 140/93, pulse 65, respiratory rate 19, O2 sat 96% on room air.  States he just wants to sleep.  Trazodone 50 mg x 1 ordered.  Additionally, analgesics ordered for his back pain.  Time 15 minutes.

## 2022-07-31 ENCOUNTER — Telehealth (HOSPITAL_COMMUNITY): Payer: Self-pay

## 2022-07-31 DIAGNOSIS — M549 Dorsalgia, unspecified: Secondary | ICD-10-CM | POA: Diagnosis not present

## 2022-07-31 DIAGNOSIS — E440 Moderate protein-calorie malnutrition: Secondary | ICD-10-CM | POA: Diagnosis not present

## 2022-07-31 DIAGNOSIS — R079 Chest pain, unspecified: Secondary | ICD-10-CM | POA: Diagnosis not present

## 2022-07-31 DIAGNOSIS — Z886 Allergy status to analgesic agent status: Secondary | ICD-10-CM | POA: Diagnosis not present

## 2022-07-31 DIAGNOSIS — Z888 Allergy status to other drugs, medicaments and biological substances status: Secondary | ICD-10-CM | POA: Diagnosis not present

## 2022-07-31 DIAGNOSIS — R42 Dizziness and giddiness: Secondary | ICD-10-CM | POA: Diagnosis not present

## 2022-07-31 DIAGNOSIS — F1210 Cannabis abuse, uncomplicated: Secondary | ICD-10-CM | POA: Diagnosis not present

## 2022-07-31 DIAGNOSIS — Z1152 Encounter for screening for COVID-19: Secondary | ICD-10-CM | POA: Diagnosis not present

## 2022-07-31 DIAGNOSIS — F419 Anxiety disorder, unspecified: Secondary | ICD-10-CM | POA: Diagnosis not present

## 2022-07-31 DIAGNOSIS — F1310 Sedative, hypnotic or anxiolytic abuse, uncomplicated: Secondary | ICD-10-CM | POA: Diagnosis not present

## 2022-07-31 DIAGNOSIS — R001 Bradycardia, unspecified: Secondary | ICD-10-CM | POA: Diagnosis not present

## 2022-07-31 DIAGNOSIS — F17210 Nicotine dependence, cigarettes, uncomplicated: Secondary | ICD-10-CM | POA: Diagnosis not present

## 2022-07-31 DIAGNOSIS — Z6822 Body mass index (BMI) 22.0-22.9, adult: Secondary | ICD-10-CM | POA: Diagnosis not present

## 2022-07-31 DIAGNOSIS — F1410 Cocaine abuse, uncomplicated: Secondary | ICD-10-CM | POA: Diagnosis not present

## 2022-07-31 DIAGNOSIS — B1920 Unspecified viral hepatitis C without hepatic coma: Secondary | ICD-10-CM | POA: Diagnosis not present

## 2022-07-31 DIAGNOSIS — Z79899 Other long term (current) drug therapy: Secondary | ICD-10-CM | POA: Diagnosis not present

## 2022-07-31 DIAGNOSIS — R932 Abnormal findings on diagnostic imaging of liver and biliary tract: Secondary | ICD-10-CM | POA: Diagnosis not present

## 2022-07-31 DIAGNOSIS — F1123 Opioid dependence with withdrawal: Secondary | ICD-10-CM | POA: Diagnosis not present

## 2022-07-31 DIAGNOSIS — R7989 Other specified abnormal findings of blood chemistry: Secondary | ICD-10-CM | POA: Diagnosis not present

## 2022-07-31 DIAGNOSIS — R112 Nausea with vomiting, unspecified: Secondary | ICD-10-CM | POA: Diagnosis present

## 2022-07-31 LAB — BLOOD CULTURE ID PANEL (REFLEXED) - BCID2

## 2022-07-31 LAB — CBC
HCT: 36.3 % — ABNORMAL LOW (ref 39.0–52.0)
Hemoglobin: 11.9 g/dL — ABNORMAL LOW (ref 13.0–17.0)
MCH: 27.9 pg (ref 26.0–34.0)
MCHC: 32.8 g/dL (ref 30.0–36.0)
MCV: 85.2 fL (ref 80.0–100.0)
Platelets: 314 10*3/uL (ref 150–400)
RBC: 4.26 MIL/uL (ref 4.22–5.81)
RDW: 14.4 % (ref 11.5–15.5)
WBC: 10.7 10*3/uL — ABNORMAL HIGH (ref 4.0–10.5)
nRBC: 0 % (ref 0.0–0.2)

## 2022-07-31 LAB — BASIC METABOLIC PANEL
Anion gap: 9 (ref 5–15)
BUN: 9 mg/dL (ref 6–20)
CO2: 26 mmol/L (ref 22–32)
Calcium: 9 mg/dL (ref 8.9–10.3)
Chloride: 103 mmol/L (ref 98–111)
Creatinine, Ser: 1.08 mg/dL (ref 0.61–1.24)
GFR, Estimated: >60 mL/min (ref >60)
Glucose, Bld: 93 mg/dL (ref 70–99)
Potassium: 3.9 mmol/L (ref 3.5–5.1)
Sodium: 138 mmol/L (ref 135–145)

## 2022-07-31 NOTE — Progress Notes (Signed)
OT Cancellation Note  Patient Details Name: Kareen Hitsman MRN: [id number] DOB: [birth year]   Cancelled Treatment:    Reason Eval/Treat Not Completed: OT screened, no needs identified, will sign off. Discussed with PT. Pt independent.   Kaheem Halleck,HILLARY 07/31/2022, 8:56 AM Maurie Boettcher, OT/L   Acute OT Clinical Specialist Acute Rehabilitation Services Pager [phone] Office [phone]

## 2022-07-31 NOTE — Telephone Encounter (Signed)
8:50 PM: Patient called my phone number in the emergency department stating that his girlfriend had missed a call as well as his aunt and was concerned that it was in regards to his recent hospital admission. Chart reviewed and appears that patients blood cultures grew staph epi and per ER provider recommendations, patient was to return to the ER for further testing. I have relayed this information to the patient and have added his contact information to his chart. Patient expressed understanding and states that he will return tomorrow.

## 2022-07-31 NOTE — Plan of Care (Signed)

## 2022-07-31 NOTE — Discharge Summary (Signed)
Physician Discharge Summary  Stephen Shepard ZOX:[id number] DOB: 07-24-1991 DOA: 07/28/2022  PCP: Patient, [name]  Admit date: 07/28/2022 Discharge date: 07/31/2022 Recommendations for Outpatient Follow-up:  Follow up with PCP in 1 weeks-call for appointment Please obtain BMP/CBC in one week  Discharge Dispo: Home Discharge Condition: Stable Code Status:   Code Status: Full Code Diet recommendation:  Diet Order             Diet regular Room service appropriate? Yes; Fluid consistency: Thin  Diet effective now                 Brief/Interim Summary: 32 year old male with history of polysubstance abuse THC and cocaine admitted with intractable nausea vomiting abdominal pain, and also feverish. In the ED CT abdomen pelvis with pericholecystic fluid surgery was consulted does not feel acute gallbladder, also underwent ultrasound of gallbladder, patient failed oral challenge admitted for intractable nausea vomiting. He had low-grade fever 100.6, labs with fairly stable CMP, CBC negative troponin x 2 and normal lactic acid, COVID-19 negative.  UDS positive for benzo cocaine and THC.  CT head no acute finding. Chest x-ray " prominent bilateral interstitial opacities could represent pulmonary edema or atypical infection. No focal airspace opacity to suggest Pneumonia" Patient was managed symptomatically IV hydration antiemetics pain medication.  He clinically improved.  Have episodes of fever respiratory virus panel negative chest x-ray did not show pneumonia, blood culture was obtained and no growth so far.  Patient feels clinically improved , he would like to go home today.  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 TOC consulted to help with resources and clinic info [facility] but patient voiced that he will get the Suboxone from his clinic, requested discharge.  He was discharged in medically stable condition. His blood culture  came back abnormal after his discharge but on further follow-up he was found to be growing Staph capitis and Staph epidermidis skin contamination, do not suspect true infection.  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  Pericholecystic fluid in CT seen by surgery-does not patient has acute gallbladder. Anxiety: stable mood History hepatitis C infection Bradycardia-stable Low-grade fever, negative respiratory virus panel.  Chest x-ray reviewed no cough or respiratory symptoms.  Had blood cx contamination  Consults: CCS Subjective: Alert awake oriented no abdominal pain nausea vomiting not resolved.  He feels improved he wants to go home today  Discharge Exam: Vitals:   07/30/22 2300 07/31/22 0309  BP: (!) 128/90 131/86  Pulse: 60 60  Resp: 10 18  Temp: 99 F (37.2 C) 98.9 F (37.2 C)  SpO2:     General: Pt is alert, awake, not in acute distress Cardiovascular: RRR, S1/S2 +, no rubs, no gallops Respiratory: CTA bilaterally, no wheezing, no rhonchi Abdominal: Soft, NT, ND, bowel sounds + Extremities: no edema, no cyanosis  Discharge Instructions  Discharge Instructions     Discharge instructions   Complete by: As directed    Please call call MD or return to ER for similar or worsening recurring problem that brought you to hospital  or if any fever,nausea/vomiting,abdominal pain, uncontrolled pain, chest pain,  shortness of breath or any other alarming symptoms.  Please follow-up your doctor as instructed in a week time and call the office for appointment.  Please avoid alcohol, smoking, or any other illicit substance and maintain healthy habits including taking your regular medications as prescribed.  You were cared for by a hospitalist during your hospital stay. If you have any questions about your discharge medications or the care you received while you were in the hospital after you are discharged, you can call the unit and ask to speak with the hospitalist on call if the hospitalist that took care of you is not available.  Once you are discharged, your primary care physician will handle any further medical issues. Please note that NO REFILLS for any discharge medications will be authorized once you are discharged, as it is imperative that you return to your primary care physician (or establish a relationship with a primary care physician if you do not have one) for your aftercare needs so that they can reassess your need for medications and monitor your lab values   Increase activity slowly   Complete by: As directed       Allergies as of 07/31/2022       Reactions   Ibuprofen Anaphylaxis   Keppra [levetiracetam] Anaphylaxis   Wasp Venom Protein Swelling   hives   Nitroglycerin Other (See Comments)   Pt reports "real bad chest pain"        Medication List     TAKE these medications    gabapentin 300 MG capsule Commonly known as: NEURONTIN Take 1 capsule (300 mg total) by mouth 2 (two) times daily.   hydrOXYzine 25 MG tablet Commonly known as: ATARAX Take 1 tablet (25 mg total) by mouth every 6 (six) hours as needed for anxiety.   nicotine 21 mg/24hr patch Commonly known as: NICODERM CQ - dosed in mg/24 hours Place 1 patch (21 mg total) onto the skin daily.   nicotine polacrilex 2 MG gum Commonly  known as: NICORETTE Take 1 each (2 mg total) by mouth as needed for smoking cessation.        Follow-up [place] Follow up in 1 week(s).   Contact information: [place] [address] [phone]               Allergies  Allergen Reactions   Ibuprofen Anaphylaxis   Keppra [Levetiracetam] Anaphylaxis   Wasp Venom Protein Swelling    hives   Nitroglycerin Other (See Comments)    Pt reports "real bad chest pain"    The results of significant diagnostics from this hospitalization (including imaging, microbiology, ancillary and laboratory) are listed below for reference.    Microbiology: Recent Results (from the past 240 hour(s))  Respiratory (~20 pathogens) panel by PCR     Status: None   Collection Time: 07/29/22  9:06 AM   Specimen: Nasopharyngeal Swab; Respiratory  Result Value Ref Range Status   Adenovirus NOT DETECTED NOT DETECTED Final   Coronavirus 229E NOT DETECTED NOT DETECTED Final    Comment: (NOTE) The Coronavirus on the Respiratory Panel, DOES NOT test for the novel  Coronavirus (2019 nCoV)    Coronavirus HKU1 NOT DETECTED NOT DETECTED Final   Coronavirus NL63 NOT DETECTED NOT DETECTED Final   Coronavirus OC43 NOT DETECTED NOT DETECTED Final   Metapneumovirus NOT DETECTED NOT DETECTED Final   Rhinovirus / Enterovirus NOT DETECTED NOT DETECTED Final   Influenza A NOT DETECTED NOT DETECTED Final   Influenza B NOT DETECTED NOT DETECTED Final   Parainfluenza Virus 1 NOT DETECTED NOT DETECTED Final   Parainfluenza Virus 2 NOT DETECTED NOT DETECTED Final   Parainfluenza Virus 3 NOT DETECTED NOT DETECTED Final   Parainfluenza Virus 4 NOT DETECTED NOT DETECTED Final   Respiratory Syncytial Virus NOT DETECTED NOT DETECTED Final   Bordetella pertussis NOT DETECTED NOT DETECTED Final   Bordetella Parapertussis NOT DETECTED NOT DETECTED Final   Chlamydophila pneumoniae  NOT DETECTED NOT DETECTED Final   Mycoplasma pneumoniae NOT DETECTED NOT DETECTED Final    Comment: Performed at [facility], [address], [place], [place] [address]  Culture, blood (Routine X 2) w Reflex to ID Panel     Status: None (Preliminary result)   Collection Time: 07/30/22  4:21 PM   Specimen: BLOOD LEFT ARM  Result Value Ref Range Status   Specimen Description BLOOD LEFT ARM  Final   Special Requests   Final    IN PEDIATRIC BOTTLE Blood Culture results may not be optimal due to an inadequate volume of blood received in culture bottles   Culture   Final    NO GROWTH < 24 HOURS Performed at [facility], [address], [place], [place] [address]    Report Status PENDING  Incomplete  Culture, blood (Routine X 2) w Reflex to ID Panel     Status: None (Preliminary result)   Collection Time: 07/30/22  4:21 PM   Specimen: BLOOD LEFT ARM  Result Value Ref Range Status   Specimen Description BLOOD LEFT ARM  Final   Special Requests   Final    IN PEDIATRIC BOTTLE Blood Culture results may not be optimal due to an inadequate volume of blood received in culture bottles   Culture   Final    NO GROWTH < 24 HOURS Performed at [facility], [address], [place], [place] [address]    Report Status PENDING  Incomplete    Procedures/Studies: DG Chest Port 1 View  Result Date: 07/30/2022 CLINICAL DATA:  Fever EXAM: PORTABLE CHEST 1 VIEW COMPARISON:  07/28/2022 FINDINGS: Cardiac size is within normal limits. There are no signs of pulmonary edema or focal pulmonary consolidation. There is no pleural effusion or pneumothorax. IMPRESSION: No focal pulmonary infiltrates are seen. There is no pleural effusion. Electronically Signed   By: Ernie Avena M.D.   On: 07/30/2022 17:00   EEG adult  Result Date: 07/29/2022 Charlsie Quest, MD     07/29/2022  4:50 PM Patient Name: Oaklee Sunga MRN: [id number] Epilepsy Attending: Charlsie Quest Referring  Physician/Provider: Lanae Boast, MD Date: 07/29/2022 Duration: 27.11 mins Patient history: 31yo M getting eeg to evaluate fro seizure like activity. Level of alertness: Awake AEDs during EEG study: Ativan Technical aspects: This EEG study was done with scalp electrodes positioned according to the 10-20 International system of electrode placement. Electrical activity was reviewed with band pass filter of 1-70Hz , sensitivity of 7 uV/mm, display speed of 68mm/sec with a 60Hz  notched filter applied as appropriate. EEG data were recorded continuously and digitally stored.  Video monitoring was available and reviewed as  appropriate. Description: The posterior dominant rhythm consists of 8Hz  activity of moderate voltage (25-35 uV) seen predominantly in posterior head regions, symmetric and reactive to eye opening and eye closing. There is an excessive amount of 15 to 18 Hz beta activity distributed symmetrically and diffusely. Hyperventilation and photic stimulation were not performed.   ABNORMALITY - Excessive beta, generalized IMPRESSION: This study is within normal limits. The excessive beta activity seen in the background is most likely due to the effect of benzodiazepine and is a benign EEG pattern. No seizures or epileptiform discharges were seen throughout the recording. A normal interictal EEG does not exclude the diagnosis of epilepsy. Priyanka   Annabelle Harman Abdomen Limited RUQ (LIVER/GB)  Result Date: 07/28/2022 CLINICAL DATA:  Abdominal pain. EXAM: ULTRASOUND ABDOMEN LIMITED RIGHT UPPER QUADRANT COMPARISON:  None Available. FINDINGS: Gallbladder: No gallstones are visualized. The gallbladder wall measures 2.98 mm in thickness. A mild amount of pericholecystic fluid is seen. No sonographic Murphy sign noted by sonographer. Common bile duct: Diameter: 1.96 mm Liver: No focal lesion identified. Within normal limits in parenchymal echogenicity. Portal vein is patent on color Doppler imaging with normal direction  of blood flow towards the liver. Other: None. IMPRESSION: Pericholecystic fluid and mild gallbladder wall thickening, without evidence of cholelithiasis or acute cholecystitis. Electronically Signed   By: 07/30/2022 M.D.   On: 07/28/2022 21:05   CT ABDOMEN PELVIS W CONTRAST  Result Date: 07/28/2022 CLINICAL DATA:  Abdominal pain, nausea, vomiting, altered mental status. Bradycardia. EXAM: CT ABDOMEN AND PELVIS WITH CONTRAST TECHNIQUE: Multidetector CT imaging of the abdomen and pelvis was performed using the standard protocol following bolus administration of intravenous contrast. RADIATION DOSE REDUCTION: This exam was performed according to the departmental dose-optimization program which includes automated exposure control, adjustment of the mA and/or kV according to patient size and/or use of iterative reconstruction technique. CONTRAST:  75 mL Omnipaque 350non-ionic IV contrast COMPARISON:  01/30/2022 FINDINGS: Lower chest: No acute abnormality. Minimal dependent basilar subsegmental atelectasis. Hepatobiliary: No focal liver abnormality is seen. No gallstones, gallbladder wall thickening, or biliary dilatation. Pericholecystic fluid is noted. If there is concern cholecystitis consider ultrasound correlation. Pancreas: Unremarkable. No pancreatic ductal dilatation or surrounding inflammatory changes. Spleen: Normal in size without focal abnormality. Adrenals/Urinary Tract: Adrenal glands are unremarkable. Kidneys are normal, without renal calculi, focal lesion, or hydronephrosis. Bladder is unremarkable. Stomach/Bowel: Stomach is within normal limits. Appendix appears normal. No evidence of bowel wall thickening, distention, or inflammatory changes. Vascular/Lymphatic: No significant vascular findings are present. No enlarged abdominal or pelvic lymph nodes. Reproductive: The prostate is unremarkable. Other: No abdominal wall hernia or abnormality. No abdominopelvic ascites. Musculoskeletal: No  acute or significant osseous findings. IMPRESSION: Pericholecystic fluid. Otherwise no acute abdominal or pelvic pathology identified. Electronically Signed   By: 04/02/2022 M.D.   On: 07/28/2022 18:41   CT HEAD WO CONTRAST (07/30/2022)  Result Date: 07/28/2022 CLINICAL DATA:  Head trauma EXAM: CT HEAD WITHOUT CONTRAST TECHNIQUE: Contiguous axial images were obtained from the base of the skull through the vertex without intravenous contrast. RADIATION DOSE REDUCTION: This exam was performed according to the departmental dose-optimization program which includes automated exposure control, adjustment of the mA and/or kV according to patient size and/or use of iterative reconstruction technique. COMPARISON:  02/01/2022 FINDINGS: Brain: No evidence of acute infarction, hemorrhage, hydrocephalus, extra-axial collection or mass lesion/mass effect. Vascular: No hyperdense vessel or unexpected calcification. Skull: Normal. Negative for fracture or focal lesion. Sinuses/Orbits: No acute finding. IMPRESSION: No acute intracranial  process. Electronically Signed   By: Sammie Bench M.D.   On: 07/28/2022 18:37   DG Chest Portable 1 View  Result Date: 07/28/2022 CLINICAL DATA:  Altered mental status EXAM: PORTABLE CHEST 1 VIEW COMPARISON:  CXR 01/30/22 FINDINGS: Pleural effusion. No pneumothorax. Unchanged cardiac and mediastinal contours when accounting for differences in lung volumes. No displaced rib fractures. No focal airspace opacity. There are prominent bilateral interstitial opacities. Visualized upper abdomen is unremarkable. IMPRESSION: Prominent bilateral interstitial opacities could represent pulmonary edema or atypical infection. No focal airspace opacity to suggest pneumonia. Electronically Signed   By: Marin Roberts M.D.   On: 07/28/2022 13:21    Labs: BNP (last 3 results) No results for input(s): "BNP" in the last 8760 hours. Basic Metabolic Panel: Recent Labs  Lab 07/28/22 1248 07/28/22 1249  07/29/22 0730 07/30/22 0256 07/31/22 0222  NA 143 141  144  --  140 138  K 3.5 3.5  3.5  --  3.4* 3.9  CL 106 110  --  105 103  CO2  --  25  --  25 26  GLUCOSE 106* 112*  --  139* 93  BUN 7 8  --  10 9  CREATININE 0.70 0.83  --  1.25* 1.08  CALCIUM  --  8.1*  --  8.9 9.0  MG  --  1.7  --  2.0  --   PHOS  --   --  2.5  --   --    Liver Function Tests: Recent Labs  Lab 07/28/22 1249  AST 24  ALT 17  ALKPHOS 47  BILITOT 0.5  PROT 6.5  ALBUMIN 3.3*   No results for input(s): "LIPASE", "AMYLASE" in the last 168 hours. Recent Labs  Lab 07/28/22 1248  AMMONIA 15   CBC: Recent Labs  Lab 07/28/22 1248 07/28/22 1249 07/30/22 0256 07/31/22 0222  WBC  --  6.9 9.9 10.7*  NEUTROABS  --  4.7 6.0  --   HGB 11.2* 11.0*  10.9* 12.9* 11.9*  HCT 33.0* 32.9*  32.0* 37.7* 36.3*  MCV  --  85.7 83.6 85.2  PLT  --  249 351 314   Cardiac Enzymes: No results for input(s): "CKTOTAL", "CKMB", "CKMBINDEX", "TROPONINI" in the last 168 hours. BNP: Invalid input(s): "POCBNP" CBG: No results for input(s): "GLUCAP" in the last 168 hours. D-Dimer No results for input(s): "DDIMER" in the last 72 hours. Hgb A1c No results for input(s): "HGBA1C" in the last 72 hours. Lipid Profile No results for input(s): "CHOL", "HDL", "LDLCALC", "TRIG", "CHOLHDL", "LDLDIRECT" in the last 72 hours. Thyroid function studies Recent Labs    07/29/22 0731  TSH 0.033*   Anemia work up No results for input(s): "VITAMINB12", "FOLATE", "FERRITIN", "TIBC", "IRON", "RETICCTPCT" in the last 72 hours. Urinalysis    Component Value Date/Time   COLORURINE YELLOW 07/28/2022 Carbon 07/28/2022 1452   LABSPEC 1.017 07/28/2022 1452   PHURINE 7.0 07/28/2022 1452   GLUCOSEU NEGATIVE 07/28/2022 1452   HGBUR NEGATIVE 07/28/2022 1452   BILIRUBINUR NEGATIVE 07/28/2022 1452   KETONESUR 20 (A) 07/28/2022 1452   PROTEINUR 100 (A) 07/28/2022 1452   NITRITE NEGATIVE 07/28/2022 1452   LEUKOCYTESUR  NEGATIVE 07/28/2022 1452   Sepsis Labs Recent Labs  Lab 07/28/22 1249 07/30/22 0256 07/31/22 0222  WBC 6.9 9.9 10.7*   Microbiology Recent Results (from the past 240 hour(s))  Respiratory (~20 pathogens) panel by PCR     Status: None   Collection Time: 07/29/22  9:06  AM   Specimen: Nasopharyngeal Swab; Respiratory  Result Value Ref Range Status   Adenovirus NOT DETECTED NOT DETECTED Final   Coronavirus 229E NOT DETECTED NOT DETECTED Final    Comment: (NOTE) The Coronavirus on the Respiratory Panel, DOES NOT test for the novel  Coronavirus (2019 nCoV)    Coronavirus HKU1 NOT DETECTED NOT DETECTED Final   Coronavirus NL63 NOT DETECTED NOT DETECTED Final   Coronavirus OC43 NOT DETECTED NOT DETECTED Final   Metapneumovirus NOT DETECTED NOT DETECTED Final   Rhinovirus / Enterovirus NOT DETECTED NOT DETECTED Final   Influenza A NOT DETECTED NOT DETECTED Final   Influenza B NOT DETECTED NOT DETECTED Final   Parainfluenza Virus 1 NOT DETECTED NOT DETECTED Final   Parainfluenza Virus 2 NOT DETECTED NOT DETECTED Final   Parainfluenza Virus 3 NOT DETECTED NOT DETECTED Final   Parainfluenza Virus 4 NOT DETECTED NOT DETECTED Final   Respiratory Syncytial Virus NOT DETECTED NOT DETECTED Final   Bordetella pertussis NOT DETECTED NOT DETECTED Final   Bordetella Parapertussis NOT DETECTED NOT DETECTED Final   Chlamydophila pneumoniae NOT DETECTED NOT DETECTED Final   Mycoplasma pneumoniae NOT DETECTED NOT DETECTED Final    Comment: Performed at [facility], [address] [place], [place] [address]  Culture, blood (Routine X 2) w Reflex to ID Panel     Status: None (Preliminary result)   Collection Time: 07/30/22  4:21 PM   Specimen: BLOOD LEFT ARM  Result Value Ref Range Status   Specimen Description BLOOD LEFT ARM  Final   Special Requests   Final    IN PEDIATRIC BOTTLE Blood Culture results may not be optimal due to an inadequate volume of blood received in culture bottles    Culture   Final    NO GROWTH < 24 HOURS Performed at [facility], [address] [place], [place] [address]    Report Status PENDING  Incomplete  Culture, blood (Routine X 2) w Reflex to ID Panel     Status: None (Preliminary result)   Collection Time: 07/30/22  4:21 PM   Specimen: BLOOD LEFT ARM  Result Value Ref Range Status   Specimen Description BLOOD LEFT ARM  Final   Special Requests   Final    IN PEDIATRIC BOTTLE Blood Culture results may not be optimal due to an inadequate volume of blood received in culture bottles   Culture   Final    NO GROWTH < 24 HOURS Performed at [facility], [address] [place], [place] [address]    Report Status PENDING  Incomplete  Time coordinating discharge: 25 minutes  SIGNED: Lanae Boast, MD  [facility] 07/31/2022, 11:09 AM  If 7PM-7AM, please contact night-coverage [email or web address]

## 2022-07-31 NOTE — Progress Notes (Addendum)
Patient discharged per md orders. Patient stable at time of discharge, no indications of acute distress noted. Vitals stable. Reviewed discharge instructions, follow up with patient, allowed time for questions, patient verbalizes understanding, hard copy provided to patient. All patient belongings with patient at time of discharge. CSW visited patient prior to discharge. MD Maren [place]) wanted to inform MD that patient's suboxone may not be able to be arranged due to holiday today, informed patient and patient says that this is ok that he would still  like to be discharged and can get his meds form his job. Informed MD

## 2022-07-31 NOTE — Evaluation (Signed)
Physical Therapy Evaluation Patient Details Name: Stephen Shepard MRN: [id number] DOB: 04/16/1991 Today's Date: 07/31/2022  History of Present Illness  32 yo admitted with intractable nausea and vomiting and abdominal pain. UDS + cocaine, benzos & THC. PMH: Hep C, polysubstance abuse (including IVDU), anxiety, seizures.  Clinical Impression   Pt presents with [facility] strength, balance, ROM, and activity tolerance. Pt is completely independent with all mobility at this time, no deficits noted. PT to sign off.        Recommendations for follow up therapy are one component of a multi-disciplinary discharge planning process, led by the attending physician.  Recommendations may be updated based on patient status, additional functional criteria and insurance authorization.  Follow Up Recommendations No PT follow up      Assistance Recommended at Discharge PRN  Patient can return home with the following       Equipment Recommendations None recommended by PT  Recommendations for Other Services       Functional Status Assessment Patient has not had a recent decline in their functional status     Precautions / Restrictions Precautions Precautions: Fall Restrictions Weight Bearing Restrictions: No      Mobility  Bed Mobility Overal bed mobility: Independent                  Transfers Overall transfer level: Independent                      Ambulation/Gait Ambulation/Gait assistance: Independent Gait Distance (Feet): 350 Feet Assistive device: None Gait Pattern/deviations: Step-through pattern, WFL(Within Functional Limits) Gait velocity: WFL     General Gait Details: WFL speed, tolerance  Stairs            Wheelchair Mobility    Modified Rankin (Stroke Patients Only)       Balance Overall balance assessment: Independent (step over object, pick up object from ground, head turns during gait, direction change all WFL)                                            Pertinent Vitals/Pain Pain Assessment Pain Assessment: No/denies pain    Home Living Family/patient expects to be discharged to:: Private residence Living Arrangements: Spouse/significant other Available Help at Discharge: Friend(s);Family Type of Home: House Home Access: Level entry       Home Layout: One level Home Equipment: None      Prior Function Prior Level of Function : Independent/Modified Independent             Mobility Comments: Pt previously worked as a Freight forwarder at [employer], 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   Dominant Hand: Right    Extremity/Trunk Assessment   Upper Extremity Assessment Upper Extremity Assessment: Overall WFL for tasks assessed    Lower Extremity Assessment Lower Extremity Assessment: Overall WFL for tasks assessed    Cervical / Trunk Assessment Cervical / Trunk Assessment: Normal  Communication   Communication: No difficulties  Cognition Arousal/Alertness: Awake/alert Behavior During Therapy: WFL for tasks assessed/performed Overall Cognitive Status: Within Functional Limits for tasks assessed                                          General Comments  Exercises     Assessment/Plan    PT Assessment Patient does not need any further PT services  PT Problem List         PT Treatment Interventions      PT Goals (Current goals can be found in the Care Plan section)  Acute Rehab PT Goals Patient Stated Goal: home PT Goal Formulation: With patient Time For Goal Achievement: 07/31/22 Potential to Achieve Goals: Good    Frequency       Co-evaluation               AM-PAC PT "6 Clicks" Mobility  Outcome Measure Help needed turning from your back to your side while in a flat bed without using bedrails?: None Help needed moving from lying on your back to sitting on the side of a flat bed without using bedrails?: None Help needed moving  to and from a bed to a chair (including a wheelchair)?: None Help needed standing up from a chair using your arms (e.g., wheelchair or bedside chair)?: None Help needed to walk in hospital room?: None Help needed climbing 3-5 steps with a railing? : None 6 Click Score: 24    End of Session   Activity Tolerance: Patient tolerated treatment well Patient left: in bed;with call bell/phone within reach Nurse Communication: Mobility status PT Visit Diagnosis: Other abnormalities of gait and mobility (R26.89)    Time: 0981-1914 PT Time Calculation (min) (ACUTE ONLY): 10 min   Charges:   PT Evaluation $PT Eval Low Complexity: 1 Low        Yolanda Dockendorf S, PT DPT Acute Rehabilitation Services Pager [phone]  Office [phone]   [place] E Ruffin Pyo 07/31/2022, 9:50 AM

## 2022-07-31 NOTE — Telephone Encounter (Signed)
Positive blood cultures called and reported from micro, one bottle grew straph epi, went over results with Dr. Doren Custard, attempted to call pt back to return to ED for further testing, phone number on file does not work, no answer from significant other or aunt.

## 2022-07-31 NOTE — Hospital Course (Signed)
32 year old male with history of polysubstance abuse THC and cocaine admitted with intractable nausea vomiting abdominal pain, and also feverish. In the ED CT abdomen pelvis with pericholecystic fluid surgery was consulted does not feel acute gallbladder, also underwent ultrasound of gallbladder, patient failed oral challenge admitted for intractable nausea vomiting. He had low-grade fever 100.6, labs with fairly stable CMP, CBC negative troponin x 2 and normal lactic acid, COVID-19 negative.  UDS positive for benzo cocaine and THC.  CT head no acute finding. Chest x-ray " prominent bilateral interstitial opacities could represent pulmonary edema or atypical infection. No focal airspace opacity to suggest Pneumonia" Patient was managed symptomatically IV hydration antiemetics pain medication.  He clinically improved.  Have episodes of fever respiratory virus panel negative chest x-ray did not show pneumonia, blood culture was obtained and no growth so far.  Patient feels clinically improved , he would like to go home today.  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 TOC consulted to help with resources and clinic info

## 2022-07-31 NOTE — Progress Notes (Signed)
CSW met with pt in response to substance use consult. Pt is familiar with substance use treatment and safe syringe exchange programs; he doesn't require additional resources.

## 2022-08-01 ENCOUNTER — Emergency Department (HOSPITAL_COMMUNITY)
Admission: EM | Admit: 2022-08-01 | Discharge: 2022-08-02 | Discharge status: Against Medical Advice | Payer: No Typology Code available for payment source | Attending: Emergency Medicine | Admitting: Emergency Medicine

## 2022-08-01 ENCOUNTER — Other Ambulatory Visit: Payer: Self-pay

## 2022-08-01 ENCOUNTER — Emergency Department (HOSPITAL_COMMUNITY): Payer: Self-pay

## 2022-08-01 ENCOUNTER — Emergency Department (HOSPITAL_COMMUNITY)
Admission: EM | Admit: 2022-08-01 | Discharge: 2022-08-01 | Discharge status: Against Medical Advice | Payer: Self-pay | Attending: Physician Assistant | Admitting: Physician Assistant

## 2022-08-01 DIAGNOSIS — R799 Abnormal finding of blood chemistry, unspecified: Secondary | ICD-10-CM | POA: Diagnosis not present

## 2022-08-01 DIAGNOSIS — Z5321 Procedure and treatment not carried out due to patient leaving prior to being seen by health care provider: Secondary | ICD-10-CM | POA: Diagnosis not present

## 2022-08-01 DIAGNOSIS — A419 Sepsis, unspecified organism: Secondary | ICD-10-CM | POA: Diagnosis not present

## 2022-08-01 NOTE — ED Triage Notes (Signed)
Pt arrived via POV. Had blood cultures done at [facility] yesterday, came back + for growth. C/o fatigue, throat, and chest congestion.  Aox4.

## 2022-08-01 NOTE — ED Provider Triage Note (Signed)
Emergency Medicine Provider Triage Evaluation Note  Stephen Shepard , a 32 y.o. male  was evaluated in triage.  Pt complains of being advised to come in due to a positive blood culture.  Pt reports he is still feeling sick   Review of Systems  Positive: cough Negative:   Physical Exam  There were no vitals taken for this visit. Gen:   Awake, no distress   Resp:  Normal effort  MSK:   Moves extremities without difficulty  Other:    Medical Decision Making  Medically screening exam initiated at 6:07 PM.  Appropriate orders placed.  Stephen Shepard was informed that the remainder of the evaluation will be completed by another provider, this initial triage assessment does not replace that evaluation, and the importance of remaining in the ED until their evaluation is complete.     Fransico Meadow, [place] 08/01/22 5102

## 2022-08-01 NOTE — ED Notes (Incomplete)
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

## 2022-08-01 NOTE — ED Triage Notes (Signed)
Pt has been waiting in the lobby today. Left, walked down the street and called EMS to be seen faster. Pt originally came for positive blood cultures, fatigue, sore throat, and chest congestion. Refuses BP bc it hurts.

## 2022-08-02 DIAGNOSIS — R799 Abnormal finding of blood chemistry, unspecified: Secondary | ICD-10-CM

## 2022-08-02 LAB — CULTURE, BLOOD (ROUTINE X 2)

## 2022-08-02 NOTE — Progress Notes (Signed)
   Follow Up Note:  After discharge, Dr. Antonieta Pert was contacted regarding a "pending positive blood culture." In that Dr. Antonieta Pert was unable to work the following day due to illness, I agreed to follow up on the results of the blood cultures on his behalf.  A review of blood culture results noted one blood culture that produced no growth, and a second (drawn at the same time and apparently from the same extremity) testing positive for growth of what was ultimately identified as Staph epi.   Prior to me following up on the results, the patient was actually contacted by another entity (unbeknownst to me) and informed he should return to the ED for "further testing." He did so, but grew impatient due to an extended wait time and ultimately left without being seen.   He called the [facility] office today and spoke to a staff member. I in turn spoke to the same staff member. The patient was reportedly feeling well without new complaints. I asked the [facility] staff member to explain to the patient that his blood culture result could simply be a "contaminant" (and asked the staff member to explain to him what this means), but it could also represent a true bacteremia. Given that the patient was not febrile and felt well, I asked the staff member to inform the patient that a return to the busy ED was not necessary, as long as he could visit an urgent care center, or PCP office, to have repeat blood cultures drawn within the week. I also asked for the staff member to inform the patient that he SHOULD return to the ED if he develops fevers, chills, or starts to otherwise feel poorly.   Cherene Altes, MD [facility] Office  [phone]  08/02/2022, 3:03 PM

## 2022-08-04 ENCOUNTER — Other Ambulatory Visit (HOSPITAL_COMMUNITY): Payer: Self-pay

## 2022-08-04 LAB — CULTURE, BLOOD (ROUTINE X 2): Culture: NO GROWTH

## 2022-08-05 ENCOUNTER — Encounter (HOSPITAL_COMMUNITY): Payer: Self-pay | Admitting: Pharmacy Technician

## 2022-08-05 ENCOUNTER — Other Ambulatory Visit: Payer: Self-pay

## 2022-08-05 ENCOUNTER — Emergency Department (HOSPITAL_COMMUNITY)
Admission: EM | Admit: 2022-08-05 | Discharge: 2022-08-05 | Disposition: A | Discharge status: Home / Self Care | Payer: No Typology Code available for payment source | Attending: Emergency Medicine | Admitting: Emergency Medicine

## 2022-08-05 ENCOUNTER — Emergency Department (HOSPITAL_COMMUNITY): Payer: No Typology Code available for payment source

## 2022-08-05 DIAGNOSIS — Z1152 Encounter for screening for COVID-19: Secondary | ICD-10-CM | POA: Insufficient documentation

## 2022-08-05 DIAGNOSIS — F17210 Nicotine dependence, cigarettes, uncomplicated: Secondary | ICD-10-CM | POA: Insufficient documentation

## 2022-08-05 DIAGNOSIS — J069 Acute upper respiratory infection, unspecified: Secondary | ICD-10-CM | POA: Diagnosis not present

## 2022-08-05 DIAGNOSIS — R059 Cough, unspecified: Secondary | ICD-10-CM | POA: Diagnosis present

## 2022-08-05 LAB — CBC WITH DIFFERENTIAL/PLATELET
Abs Immature Granulocytes: 0.03 10*3/uL (ref 0.00–0.07)
Basophils Absolute: 0.1 10*3/uL (ref 0.0–0.1)
Basophils Relative: 1 %
Eosinophils Absolute: 0.2 10*3/uL (ref 0.0–0.5)
Eosinophils Relative: 2 %
HCT: 42.6 % (ref 39.0–52.0)
Hemoglobin: 13.8 g/dL (ref 13.0–17.0)
Immature Granulocytes: 0 %
Lymphocytes Relative: 34 %
Lymphs Abs: 2.6 10*3/uL (ref 0.7–4.0)
MCH: 28.5 pg (ref 26.0–34.0)
MCHC: 32.4 g/dL (ref 30.0–36.0)
MCV: 87.8 fL (ref 80.0–100.0)
Monocytes Absolute: 0.5 10*3/uL (ref 0.1–1.0)
Monocytes Relative: 6 %
Neutro Abs: 4.4 10*3/uL (ref 1.7–7.7)
Neutrophils Relative %: 57 %
Platelets: 382 10*3/uL (ref 150–400)
RBC: 4.85 MIL/uL (ref 4.22–5.81)
RDW: 15.3 % (ref 11.5–15.5)
WBC: 7.7 10*3/uL (ref 4.0–10.5)
nRBC: 0 % (ref 0.0–0.2)

## 2022-08-05 LAB — BASIC METABOLIC PANEL
Anion gap: 12 (ref 5–15)
BUN: 9 mg/dL (ref 6–20)
CO2: 25 mmol/L (ref 22–32)
Calcium: 9.6 mg/dL (ref 8.9–10.3)
Chloride: 101 mmol/L (ref 98–111)
Creatinine, Ser: 0.96 mg/dL (ref 0.61–1.24)
GFR, Estimated: >60 mL/min (ref >60)
Glucose, Bld: 89 mg/dL (ref 70–99)
Potassium: 4.1 mmol/L (ref 3.5–5.1)
Sodium: 138 mmol/L (ref 135–145)

## 2022-08-05 LAB — RESP PANEL BY RT-PCR (RSV, FLU A&B, COVID)  RVPGX2
Influenza A by PCR: NEGATIVE
Influenza B by PCR: NEGATIVE
Resp Syncytial Virus by PCR: NEGATIVE
SARS Coronavirus 2 by RT PCR: NEGATIVE

## 2022-08-05 MED ORDER — ALBUTEROL SULFATE HFA 108 (90 BASE) MCG/ACT IN AERS: 1.0000 | INHALATION_SPRAY | Freq: Four times a day (QID) | RESPIRATORY_TRACT | 0 refills | Status: DC | PRN

## 2022-08-05 MED ORDER — BENZONATATE 100 MG PO CAPS: 100.0000 mg | ORAL_CAPSULE | Freq: Three times a day (TID) | ORAL | 0 refills | Status: DC

## 2022-08-05 MED ORDER — IPRATROPIUM-ALBUTEROL 0.5-2.5 (3) MG/3ML IN SOLN
3.0000 mL | Freq: Once | RESPIRATORY_TRACT | Status: AC
  Administered 2022-08-05: 3 mL via RESPIRATORY_TRACT
  Filled 2022-08-05: qty 3

## 2022-08-05 MED ORDER — GUAIFENESIN ER 1200 MG PO TB12: 1.0000 | ORAL_TABLET | Freq: Two times a day (BID) | ORAL | 0 refills | Status: DC

## 2022-08-05 NOTE — ED Provider Notes (Signed)
[facility] EMERGENCY DEPARTMENT Provider Note   CSN: [id number] Arrival date & time: 08/05/22  1133     History  Chief Complaint  Patient presents with   Shortness of Breath   Cough    Stephen Shepard is a 32 y.o. male.   Shortness of Breath Associated symptoms: cough   Cough Associated symptoms: shortness of breath      Patient has a history of seizures, anxiety, substance abuse.  Patient recently in the hospital.  He had blood cultures done at that time.  They ended up coming back positive for staph epidermidis.  This was felt most likely to be a skin contaminant.  Patient states he has not been having have any fevers but he has been feeling congested and short of breath in his chest.  Patient does smoke cigarettes but has not been able to smoke recently.  Home Medications Prior to Admission medications   Medication Sig Start Date End Date Taking? Authorizing Provider  albuterol (VENTOLIN HFA) 108 (90 Base) MCG/ACT inhaler Inhale 1-2 puffs into the lungs every 6 (six) hours as needed for wheezing or shortness of breath. 08/05/22  Yes Dorie Rank, MD  ASCORBIC ACID PO Take 1 tablet by mouth in the morning. Vitamin C, unknown strength   Yes [provider]  benzonatate (TESSALON) 100 MG capsule Take 1 capsule (100 mg total) by mouth every 8 (eight) hours. 08/05/22  Yes Dorie Rank, MD  Guaifenesin 1200 MG TB12 Take 1 tablet (1,200 mg total) by mouth 2 (two) times daily at 10 AM and 5 PM. 08/05/22  Yes Dorie Rank, MD      Allergies    Keppra [levetiracetam], Motrin [ibuprofen], Nitrostat [nitroglycerin], and Wasp venom protein    Review of Systems   Review of Systems  Respiratory:  Positive for cough and shortness of breath.     Physical Exam Updated Vital Signs BP 117/74 (BP Location: Left Arm)   Pulse 69   Temp 98.7 F (37.1 C) (Oral)   Resp 18   SpO2 100%  Physical Exam Vitals and nursing note reviewed.  Constitutional:      General: He is not  in acute distress.    Appearance: He is well-developed.  HENT:     Head: Normocephalic and atraumatic.     Right Ear: External ear normal.     Left Ear: External ear normal.  Eyes:     General: No scleral icterus.       Right eye: No discharge.        Left eye: No discharge.     Conjunctiva/sclera: Conjunctivae normal.  Neck:     Trachea: No tracheal deviation.  Cardiovascular:     Rate and Rhythm: Normal rate and regular rhythm.  Pulmonary:     Effort: Pulmonary effort is normal. No respiratory distress.     Breath sounds: Normal breath sounds. No stridor. No wheezing or rales.  Abdominal:     General: Bowel sounds are normal. There is no distension.     Palpations: Abdomen is soft.     Tenderness: There is no abdominal tenderness. There is no guarding or rebound.  Musculoskeletal:        General: No tenderness or deformity.     Cervical back: Neck supple.  Skin:    General: Skin is warm and dry.     Findings: No rash.  Neurological:     General: No focal deficit present.     Mental Status: He is  alert.     Cranial Nerves: No cranial nerve deficit, dysarthria or facial asymmetry.     Sensory: No sensory deficit.     Motor: No abnormal muscle tone or seizure activity.     Coordination: Coordination normal.  Psychiatric:        Mood and Affect: Mood normal.     ED Results / Procedures / Treatments   Labs (all labs ordered are listed, but only abnormal results are displayed) Labs Reviewed  RESP PANEL BY RT-PCR (RSV, FLU A&B, COVID)  [id number]  BASIC METABOLIC PANEL  CBC WITH DIFFERENTIAL/PLATELET    EKG None  Radiology DG Chest 2 View  Result Date: 08/05/2022 CLINICAL DATA:  Shortness of breath.  Productive cough for 5 days. EXAM: CHEST - 2 VIEW COMPARISON:  August 01, 2022 FINDINGS: The heart size and mediastinal contours are within normal limits. Both lungs are clear. The visualized skeletal structures are unremarkable. IMPRESSION: No active cardiopulmonary disease.  Electronically Signed   By: Gerome Sam III M.D.   On: 08/05/2022 12:57    Procedures Procedures    Medications Ordered in ED Medications  ipratropium-albuterol (DUONEB) 0.5-2.5 (3) MG/3ML nebulizer solution 3 mL (3 mLs Nebulization Given 08/05/22 1316)    ED Course/ Medical Decision Making/ A&P Clinical Course as of 08/05/22 1508  Sat Aug 05, 2022  1342 CBC with Differential CBC normal [JK]  1342 Chest x-ray without signs of pneumonia [JK]  1507 Covid RSV influenza negative.  Metabolic panel unremarkable [JK]    Clinical Course User Index [JK] Linwood Dibbles, MD                           Medical Decision Making Problems Addressed: Upper respiratory tract infection, unspecified type: acute illness or injury that poses a threat to life or bodily functions  Amount and/or Complexity of Data Reviewed Labs: ordered. Decision-making details documented in ED Course. Radiology: ordered and independent interpretation performed.  Risk OTC drugs. Prescription drug management.   Presented with URI symptoms.  Was also having cough and congestion.  No evidence of pneumonia on x-ray.  No pneumothorax.  Patient did not have any significant wheezing on exam.  His oxygen is normal.  He does smoke however there may be a component of bronchospasm.  Will discharge home with an inhaler cough medication and Mucinex.  Patient also was concerned about positive blood cultures recently.  I did review the notes and the results from the hospitalist service Dr. Sharon Seller.  Most likely contaminant.  Patient does not have any fever here.  He does not have any leukocytosis.  I doubt bacteremia or the need for repeat blood culture testing.        Final Clinical Impression(s) / ED Diagnoses Final diagnoses:  Upper respiratory tract infection, unspecified type    Rx / DC Orders ED Discharge Orders          Ordered    albuterol (VENTOLIN HFA) 108 (90 Base) MCG/ACT inhaler  Every 6 hours PRN         08/05/22 1504    Guaifenesin 1200 MG TB12  2 times daily        08/05/22 1504    benzonatate (TESSALON) 100 MG capsule  Every 8 hours        08/05/22 1504              Linwood Dibbles, MD 08/05/22 [phone]

## 2022-08-05 NOTE — ED Notes (Signed)
Pt ambulatory to waiting room. Pt verbalized understanding of discharge instructions.   

## 2022-08-05 NOTE — ED Triage Notes (Signed)
Pt bib ems with reports of lesion to  hand starting in July. Seen again for same and had blood cultures drawn, called and told to come back due to +culture results. Since then pt with fatigue, shob and productive cough. Pt oxygen saturation 91% with ems. Placed on simple mask with improvement to 100%.  BP 124/76 HR 90

## 2022-08-05 NOTE — ED Provider Triage Note (Signed)
Emergency Medicine Provider Triage Evaluation Note  Stephen Shepard , a 32 y.o. male  was evaluated in triage.  Pt complains of advised to come in due to a positive blood culture from a chronic wound on hand since July last year.  Still feels ill, with new URI symptoms and productive cough.  On 91% saturation on arrival, placed on O2.  Denies N/V.  Review of Systems  Positive:  Negative: See above  Physical Exam  BP 117/74 (BP Location: Left Arm)   Pulse 69   Temp 98.7 F (37.1 C) (Oral)   Resp 18   SpO2 100%  Gen:   Awake, no distress   Resp:  Normal effort  MSK:   Moves extremities without difficulty  Other:  On O2 via NRB, sitting comfortably.  Medical Decision Making  Medically screening exam initiated at 12:04 PM.  Appropriate orders placed.  Stephen Shepard was informed that the remainder of the evaluation will be completed by another provider, this initial triage assessment does not replace that evaluation, and the importance of remaining in the ED until their evaluation is complete.     Prince Rome, PA-C 89/21/19 1208

## 2022-08-05 NOTE — Discharge Instructions (Addendum)
Take the medications to help with your cough and congestion.  Try to quit smoking if you can.  Follow-up with a primary care doctor to be rechecked

## 2022-12-21 ENCOUNTER — Encounter (HOSPITAL_COMMUNITY): Payer: Self-pay

## 2022-12-21 ENCOUNTER — Emergency Department (HOSPITAL_COMMUNITY)
Admission: EM | Admit: 2022-12-21 | Discharge: 2022-12-22 | Disposition: A | Discharge status: Home / Self Care | Payer: 59 | Attending: Emergency Medicine | Admitting: Emergency Medicine

## 2022-12-21 ENCOUNTER — Emergency Department (HOSPITAL_COMMUNITY): Payer: Self-pay

## 2022-12-21 ENCOUNTER — Other Ambulatory Visit: Payer: Self-pay

## 2022-12-21 DIAGNOSIS — R569 Unspecified convulsions: Secondary | ICD-10-CM | POA: Diagnosis not present

## 2022-12-21 DIAGNOSIS — R410 Disorientation, unspecified: Secondary | ICD-10-CM | POA: Insufficient documentation

## 2022-12-21 DIAGNOSIS — R251 Tremor, unspecified: Secondary | ICD-10-CM | POA: Insufficient documentation

## 2022-12-21 LAB — BASIC METABOLIC PANEL
Anion gap: 9 (ref 5–15)
BUN: 15 mg/dL (ref 6–20)
CO2: 28 mmol/L (ref 22–32)
Calcium: 9.6 mg/dL (ref 8.9–10.3)
Chloride: 102 mmol/L (ref 98–111)
Creatinine, Ser: 1.21 mg/dL (ref 0.61–1.24)
GFR, Estimated: >60 mL/min (ref >60)
Glucose, Bld: 85 mg/dL (ref 70–99)
Potassium: 3.7 mmol/L (ref 3.5–5.1)
Sodium: 139 mmol/L (ref 135–145)

## 2022-12-21 LAB — CBC WITH DIFFERENTIAL/PLATELET
Abs Immature Granulocytes: 0.03 10*3/uL (ref 0.00–0.07)
Basophils Absolute: 0 10*3/uL (ref 0.0–0.1)
Basophils Relative: 0 %
Eosinophils Absolute: 0.1 10*3/uL (ref 0.0–0.5)
Eosinophils Relative: 1 %
HCT: 40.3 % (ref 39.0–52.0)
Hemoglobin: 13.2 g/dL (ref 13.0–17.0)
Immature Granulocytes: 0 %
Lymphocytes Relative: 22 %
Lymphs Abs: 2.2 10*3/uL (ref 0.7–4.0)
MCH: 28.6 pg (ref 26.0–34.0)
MCHC: 32.8 g/dL (ref 30.0–36.0)
MCV: 87.2 fL (ref 80.0–100.0)
Monocytes Absolute: 0.7 10*3/uL (ref 0.1–1.0)
Monocytes Relative: 7 %
Neutro Abs: 7.2 10*3/uL (ref 1.7–7.7)
Neutrophils Relative %: 70 %
Platelets: 206 10*3/uL (ref 150–400)
RBC: 4.62 MIL/uL (ref 4.22–5.81)
RDW: 13.8 % (ref 11.5–15.5)
WBC: 10.2 10*3/uL (ref 4.0–10.5)
nRBC: 0 % (ref 0.0–0.2)

## 2022-12-21 LAB — GLUCOSE, CAPILLARY: Glucose-Capillary: 102 mg/dL — ABNORMAL HIGH (ref 70–99)

## 2022-12-21 MED ORDER — ACETAMINOPHEN 500 MG PO TABS
1000.0000 mg | ORAL_TABLET | Freq: Once | ORAL | Status: AC
  Administered 2022-12-21: 1000 mg via ORAL
  Filled 2022-12-21: qty 2

## 2022-12-21 MED ORDER — SODIUM CHLORIDE 0.9 % IV BOLUS
1000.0000 mL | Freq: Once | INTRAVENOUS | Status: AC
  Administered 2022-12-21: 1000 mL via INTRAVENOUS

## 2022-12-21 NOTE — ED Notes (Signed)
Patient transported to CT 

## 2022-12-21 NOTE — Discharge Instructions (Addendum)
No driving, climbing to tall heights or swimming for at least 6 mos.   Referral was made to neurology.

## 2022-12-21 NOTE — ED Provider Notes (Addendum)
[facility] AT [facility] Provider Note   CSN: [id number] Arrival date & time: 12/21/22  2125     History  Chief Complaint  Patient presents with   Seizures    Stephen Shepard is a 32 y.o. male.  40 yo M with a chief complaints of an episode of loss of consciousness.  The patient reportedly felt like he was very thirsty and when he went to get something to drink he then collapsed to the ground.  He does not think that he felt anything beforehand.  He ended up having some shaking afterwards which bystanders thought perhaps was seizure activity.  He is quite confused with EMS and is improved en route.  Patient states that he was working very hard and was unable to take a break for some time and he thinks it is the biggest issue.  Complaining of pain mostly to the right side of the face.   Seizures      Home Medications Prior to Admission medications   Medication Sig Start Date End Date Taking? Authorizing Provider  albuterol (VENTOLIN HFA) 108 (90 Base) MCG/ACT inhaler Inhale 1-2 puffs into the lungs every 6 (six) hours as needed for wheezing or shortness of breath. 08/05/22  Yes Linwood Dibbles, MD  oxyCODONE-acetaminophen (PERCOCET) 10-325 MG tablet Take 2 tablets by mouth once.   Yes [provider]  benzonatate (TESSALON) 100 MG capsule Take 1 capsule (100 mg total) by mouth every 8 (eight) hours. Patient not taking: Reported on 12/21/2022 08/05/22   Linwood Dibbles, MD  Guaifenesin 1200 MG TB12 Take 1 tablet (1,200 mg total) by mouth 2 (two) times daily at 10 AM and 5 PM. Patient not taking: Reported on 12/21/2022 08/05/22   Linwood Dibbles, MD      Allergies    Keppra [levetiracetam], Motrin [ibuprofen], Nitrostat [nitroglycerin], and Wasp venom protein    Review of Systems   Review of Systems  Neurological:  Positive for seizures.    Physical Exam Updated Vital Signs BP 119/78   Pulse 61   Temp (!) 97.4 F (36.3 C)   Resp 16   SpO2 99%  Physical  Exam Vitals and nursing note reviewed.  Constitutional:      Appearance: He is well-developed.  HENT:     Head: Normocephalic.     Comments: Bruising mostly to the right zygomatic arch.  He has a small puncture wound to his upper inner lip that is not gaping.  No obvious dental abnormality.  No midline C-spine tenderness step-offs or deformities.  Rotates his head without pain. Eyes:     Pupils: Pupils are equal, round, and reactive to light.  Neck:     Vascular: No JVD.  Cardiovascular:     Rate and Rhythm: Normal rate and regular rhythm.     Heart sounds: No murmur heard.    No friction rub. No gallop.  Pulmonary:     Effort: No respiratory distress.     Breath sounds: No wheezing.  Abdominal:     General: There is no distension.     Tenderness: There is no abdominal tenderness. There is no guarding or rebound.  Musculoskeletal:        General: Normal range of motion.     Cervical back: Normal range of motion and neck supple.     Comments: Pulse motor and sensation intact to bilateral upper extremities.  No appreciable weakness bilaterally.  PMS intact to BLE.  No weakness.   Skin:  Coloration: Skin is not pale.     Findings: No rash.  Neurological:     Mental Status: He is alert and oriented to person, place, and time.  Psychiatric:        Behavior: Behavior normal.     ED Results / Procedures / Treatments   Labs (all labs ordered are listed, but only abnormal results are displayed) Labs Reviewed  GLUCOSE, CAPILLARY - Abnormal; Notable for the following components:      Result Value   Glucose-Capillary 102 (*)    All other components within normal limits  CBC WITH DIFFERENTIAL/PLATELET  BASIC METABOLIC PANEL  CBG MONITORING, ED    EKG EKG Interpretation  Date/Time:  Thursday Dec 21 2022 21:42:48 EDT Ventricular Rate:  64 PR Interval:  160 QRS Duration: 87 QT Interval:  422 QTC Calculation: 436 R Axis:   -52 Text Interpretation: Sinus rhythm LAD,  consider left anterior fascicular block Consider right ventricular hypertrophy Borderline ST elevation, lateral leads downsloping st segment in lead III seen on prior Otherwise no significant change Confirmed by Stephen Shepard [phone]) on 12/21/2022 9:48:45 PM  Radiology CT Head Wo Contrast  Result Date: 12/21/2022 CLINICAL DATA:  Seizure and fall. Bite marks noted to upper lip and laceration to right side of face EXAM: CT HEAD WITHOUT CONTRAST CT MAXILLOFACIAL WITHOUT CONTRAST TECHNIQUE: Multidetector CT imaging of the head and maxillofacial structures were performed using the standard protocol without intravenous contrast. Multiplanar CT image reconstructions of the maxillofacial structures were also generated. RADIATION DOSE REDUCTION: This exam was performed according to the departmental dose-optimization program which includes automated exposure control, adjustment of the mA and/or kV according to patient size and/or use of iterative reconstruction technique. COMPARISON:  CT head 07/28/2022 FINDINGS: CT HEAD FINDINGS Brain: No evidence of acute infarction, hemorrhage, hydrocephalus, extra-axial collection or mass lesion/mass effect. Vascular: No hyperdense vessel or unexpected calcification. Skull: Normal. Negative for fracture or focal lesion. Other: None. CT MAXILLOFACIAL FINDINGS Osseous: No fracture or mandibular dislocation. No destructive process. Dental caries and multiple maxillary teeth. Orbits: Negative. No traumatic or inflammatory finding. Sinuses: Clear. Soft tissues: Negative. IMPRESSION: 1. No acute intracranial pathology. 2. No acute facial fracture. 3. Dental caries and multiple maxillary teeth. Electronically Signed   By: Minerva Fester M.D.   On: 12/21/2022 23:05   CT Maxillofacial Wo Contrast  Result Date: 12/21/2022 CLINICAL DATA:  Seizure and fall. Bite marks noted to upper lip and laceration to right side of face EXAM: CT HEAD WITHOUT CONTRAST CT MAXILLOFACIAL WITHOUT CONTRAST  TECHNIQUE: Multidetector CT imaging of the head and maxillofacial structures were performed using the standard protocol without intravenous contrast. Multiplanar CT image reconstructions of the maxillofacial structures were also generated. RADIATION DOSE REDUCTION: This exam was performed according to the departmental dose-optimization program which includes automated exposure control, adjustment of the mA and/or kV according to patient size and/or use of iterative reconstruction technique. COMPARISON:  CT head 07/28/2022 FINDINGS: CT HEAD FINDINGS Brain: No evidence of acute infarction, hemorrhage, hydrocephalus, extra-axial collection or mass lesion/mass effect. Vascular: No hyperdense vessel or unexpected calcification. Skull: Normal. Negative for fracture or focal lesion. Other: None. CT MAXILLOFACIAL FINDINGS Osseous: No fracture or mandibular dislocation. No destructive process. Dental caries and multiple maxillary teeth. Orbits: Negative. No traumatic or inflammatory finding. Sinuses: Clear. Soft tissues: Negative. IMPRESSION: 1. No acute intracranial pathology. 2. No acute facial fracture. 3. Dental caries and multiple maxillary teeth. Electronically Signed   By: Minerva Fester M.D.   On: 12/21/2022 23:05  Procedures Procedures    Medications Ordered in ED Medications  acetaminophen (TYLENOL) tablet 1,000 mg (has no administration in time range)  sodium chloride 0.9 % bolus 1,000 mL (1,000 mLs Intravenous New Bag/Given 12/21/22 2236)    ED Course/ Medical Decision Making/ A&P                             Medical Decision Making Amount and/or Complexity of Data Reviewed Labs: ordered. Radiology: ordered. ECG/medicine tests: ordered.  Risk OTC drugs.   32 yo M with a chief complaints of a loss of consciousness.  This occurred while he was at work.  There was some reported shaking afterwards and he was reportedly confused with EMS.  Patient is not sure exactly what happened but he  does remember trying to drink some lemonade.   On my record review the patient has had a few visits to the ER for similar presentation.  Typically it seems it was related to his IV drug abuse and withdrawal.  He has had unremarkable MRI and EEG.  Will obtain a laboratory evaluation.  CT head and face.  Reassess.  CT head and face negative.  Awaiting CT read read of the T and L spine.  Labs without acute anemia, no electrolyte abnormalities.  Hx of seizures. Not on meds.    Outside records reviewed.  Patient had multiple visits to [facility], [facility], [facility] in 2015 for seizure-like activity.  Patient developed back pain.  Diffuse.  Tells me that this started after the fall.  No pain yesterday.  No loss of bowel or bladder no loss Pyrtle sensation.  Feel unlikely to be an acute cauda equina syndrome, less likely to be epidural abscess or discitis despite patient's history of IV drug abuse.  CT imaging was ordered.  Awaiting read.   Signed out to Dr. Wilkie Aye, please see their note for further details of care in the ED.   The patients results and Shepard were reviewed and discussed.   Any x-rays performed were independently reviewed by myself.   Differential diagnosis were considered with the presenting HPI.  Medications  acetaminophen (TYLENOL) tablet 1,000 mg (has no administration in time range)  sodium chloride 0.9 % bolus 1,000 mL (1,000 mLs Intravenous New Bag/Given 12/21/22 2236)    Vitals:   12/21/22 2145  BP: 119/78  Pulse: 61  Resp: 16  Temp: (!) 97.4 F (36.3 C)  SpO2: 99%    Final diagnoses:  Seizure-like activity (HCC)           Final Clinical Impression(s) / ED Diagnoses Final diagnoses:  Seizure-like activity [facility])    Rx / DC Orders ED Discharge Orders          Ordered    Ambulatory referral to Neurology        12/21/22 2337              Stephen Plan, DO 12/22/22 2301

## 2022-12-21 NOTE — ED Triage Notes (Addendum)
BIBA for seizure and fall while at work. Bite mark noted to upper lip, abrasion to right side of face, C-Collar in place.Pt reports he has had 1 seizure in the past 140 cbg 120/70 BP 98% room air 70 HR NSR

## 2022-12-25 ENCOUNTER — Encounter (HOSPITAL_COMMUNITY): Payer: Self-pay

## 2022-12-25 ENCOUNTER — Other Ambulatory Visit: Payer: Self-pay

## 2022-12-25 ENCOUNTER — Emergency Department (HOSPITAL_COMMUNITY): Payer: 59

## 2022-12-25 ENCOUNTER — Inpatient Hospital Stay (HOSPITAL_COMMUNITY)
Admission: EM | Admit: 2022-12-25 | Discharge: 2022-12-30 | DRG: 281 | Disposition: A | Discharge status: Home / Self Care | Payer: 59 | Attending: Internal Medicine | Admitting: Internal Medicine

## 2022-12-25 ENCOUNTER — Encounter (HOSPITAL_COMMUNITY)
Admission: EM | Disposition: A | Discharge status: Home / Self Care | Payer: Self-pay | Source: Home / Self Care | Attending: Cardiology

## 2022-12-25 ENCOUNTER — Inpatient Hospital Stay (HOSPITAL_COMMUNITY): Payer: 59

## 2022-12-25 DIAGNOSIS — R9431 Abnormal electrocardiogram [ECG] [EKG]: Secondary | ICD-10-CM

## 2022-12-25 DIAGNOSIS — F17210 Nicotine dependence, cigarettes, uncomplicated: Secondary | ICD-10-CM | POA: Diagnosis present

## 2022-12-25 DIAGNOSIS — Z9109 Other allergy status, other than to drugs and biological substances: Secondary | ICD-10-CM | POA: Diagnosis not present

## 2022-12-25 DIAGNOSIS — J45909 Unspecified asthma, uncomplicated: Secondary | ICD-10-CM | POA: Diagnosis present

## 2022-12-25 DIAGNOSIS — R7303 Prediabetes: Secondary | ICD-10-CM | POA: Diagnosis present

## 2022-12-25 DIAGNOSIS — D72829 Elevated white blood cell count, unspecified: Secondary | ICD-10-CM | POA: Diagnosis present

## 2022-12-25 DIAGNOSIS — I21A1 Myocardial infarction type 2: Secondary | ICD-10-CM | POA: Diagnosis present

## 2022-12-25 DIAGNOSIS — F419 Anxiety disorder, unspecified: Secondary | ICD-10-CM | POA: Diagnosis present

## 2022-12-25 DIAGNOSIS — F1920 Other psychoactive substance dependence, uncomplicated: Secondary | ICD-10-CM | POA: Diagnosis present

## 2022-12-25 DIAGNOSIS — Z888 Allergy status to other drugs, medicaments and biological substances status: Secondary | ICD-10-CM | POA: Diagnosis not present

## 2022-12-25 DIAGNOSIS — R636 Underweight: Secondary | ICD-10-CM | POA: Diagnosis present

## 2022-12-25 DIAGNOSIS — S069X9A Unspecified intracranial injury with loss of consciousness of unspecified duration, initial encounter: Secondary | ICD-10-CM | POA: Diagnosis present

## 2022-12-25 DIAGNOSIS — R569 Unspecified convulsions: Secondary | ICD-10-CM | POA: Diagnosis present

## 2022-12-25 DIAGNOSIS — F600 Paranoid personality disorder: Secondary | ICD-10-CM | POA: Diagnosis present

## 2022-12-25 DIAGNOSIS — Z781 Physical restraint status: Secondary | ICD-10-CM

## 2022-12-25 DIAGNOSIS — F1120 Opioid dependence, uncomplicated: Secondary | ICD-10-CM | POA: Diagnosis not present

## 2022-12-25 DIAGNOSIS — H5500 Unspecified nystagmus: Secondary | ICD-10-CM | POA: Diagnosis present

## 2022-12-25 DIAGNOSIS — I25111 Atherosclerotic heart disease of native coronary artery with angina pectoris with documented spasm: Principal | ICD-10-CM | POA: Diagnosis present

## 2022-12-25 DIAGNOSIS — Z886 Allergy status to analgesic agent status: Secondary | ICD-10-CM | POA: Diagnosis not present

## 2022-12-25 DIAGNOSIS — I10 Essential (primary) hypertension: Secondary | ICD-10-CM | POA: Diagnosis present

## 2022-12-25 DIAGNOSIS — I088 Other rheumatic multiple valve diseases: Secondary | ICD-10-CM | POA: Diagnosis not present

## 2022-12-25 DIAGNOSIS — Y990 Civilian activity done for income or pay: Secondary | ICD-10-CM

## 2022-12-25 DIAGNOSIS — Z79899 Other long term (current) drug therapy: Secondary | ICD-10-CM

## 2022-12-25 DIAGNOSIS — I5030 Unspecified diastolic (congestive) heart failure: Secondary | ICD-10-CM

## 2022-12-25 DIAGNOSIS — F061 Catatonic disorder due to known physiological condition: Secondary | ICD-10-CM | POA: Diagnosis not present

## 2022-12-25 DIAGNOSIS — Z681 Body mass index (BMI) 19 or less, adult: Secondary | ICD-10-CM

## 2022-12-25 DIAGNOSIS — W19XXXA Unspecified fall, initial encounter: Secondary | ICD-10-CM | POA: Diagnosis present

## 2022-12-25 DIAGNOSIS — F29 Unspecified psychosis not due to a substance or known physiological condition: Secondary | ICD-10-CM

## 2022-12-25 DIAGNOSIS — R079 Chest pain, unspecified: Secondary | ICD-10-CM | POA: Diagnosis not present

## 2022-12-25 DIAGNOSIS — I249 Acute ischemic heart disease, unspecified: Secondary | ICD-10-CM | POA: Diagnosis present

## 2022-12-25 DIAGNOSIS — F445 Conversion disorder with seizures or convulsions: Secondary | ICD-10-CM | POA: Diagnosis not present

## 2022-12-25 DIAGNOSIS — R4182 Altered mental status, unspecified: Secondary | ICD-10-CM | POA: Diagnosis not present

## 2022-12-25 DIAGNOSIS — I213 ST elevation (STEMI) myocardial infarction of unspecified site: Secondary | ICD-10-CM | POA: Diagnosis not present

## 2022-12-25 DIAGNOSIS — F99 Mental disorder, not otherwise specified: Secondary | ICD-10-CM

## 2022-12-25 HISTORY — PX: LEFT HEART CATH AND CORONARY ANGIOGRAPHY: CATH118249

## 2022-12-25 LAB — CBC WITH DIFFERENTIAL/PLATELET
Abs Immature Granulocytes: 0.04 10*3/uL (ref 0.00–0.07)
Basophils Absolute: 0 10*3/uL (ref 0.0–0.1)
Basophils Relative: 0 %
Eosinophils Absolute: 0 10*3/uL (ref 0.0–0.5)
Eosinophils Relative: 0 %
HCT: 45.2 % (ref 39.0–52.0)
Hemoglobin: 15.3 g/dL (ref 13.0–17.0)
Immature Granulocytes: 0 %
Lymphocytes Relative: 14 %
Lymphs Abs: 1.5 10*3/uL (ref 0.7–4.0)
MCH: 28.5 pg (ref 26.0–34.0)
MCHC: 33.8 g/dL (ref 30.0–36.0)
MCV: 84.3 fL (ref 80.0–100.0)
Monocytes Absolute: 0.6 10*3/uL (ref 0.1–1.0)
Monocytes Relative: 5 %
Neutro Abs: 8.4 10*3/uL — ABNORMAL HIGH (ref 1.7–7.7)
Neutrophils Relative %: 81 %
Platelets: 223 10*3/uL (ref 150–400)
RBC: 5.36 MIL/uL (ref 4.22–5.81)
RDW: 13.2 % (ref 11.5–15.5)
WBC: 10.5 10*3/uL (ref 4.0–10.5)
nRBC: 0 % (ref 0.0–0.2)

## 2022-12-25 LAB — PROTIME-INR
INR: 1.1 (ref 0.8–1.2)
Prothrombin Time: 14.7 seconds (ref 11.4–15.2)

## 2022-12-25 LAB — URINALYSIS, ROUTINE W REFLEX MICROSCOPIC
Bilirubin Urine: NEGATIVE
Glucose, UA: NEGATIVE mg/dL
Hgb urine dipstick: NEGATIVE
Ketones, ur: 20 mg/dL — AB
Leukocytes,Ua: NEGATIVE
Nitrite: NEGATIVE
Protein, ur: 30 mg/dL — AB
Specific Gravity, Urine: 1.027 (ref 1.005–1.030)
pH: 5 (ref 5.0–8.0)

## 2022-12-25 LAB — ECHOCARDIOGRAM COMPLETE
Area-P 1/2: 4.31 cm2
Height: 76 in
S' Lateral: 3.5 cm
Weight: 2800 oz

## 2022-12-25 LAB — COMPREHENSIVE METABOLIC PANEL
ALT: 43 U/L (ref 0–44)
AST: 40 U/L (ref 15–41)
Albumin: 4.3 g/dL (ref 3.5–5.0)
Alkaline Phosphatase: 75 U/L (ref 38–126)
Anion gap: 11 (ref 5–15)
BUN: 16 mg/dL (ref 6–20)
CO2: 26 mmol/L (ref 22–32)
Calcium: 9.5 mg/dL (ref 8.9–10.3)
Chloride: 102 mmol/L (ref 98–111)
Creatinine, Ser: 1.18 mg/dL (ref 0.61–1.24)
GFR, Estimated: >60 mL/min (ref >60)
Glucose, Bld: 116 mg/dL — ABNORMAL HIGH (ref 70–99)
Potassium: 3.6 mmol/L (ref 3.5–5.1)
Sodium: 139 mmol/L (ref 135–145)
Total Bilirubin: 1.3 mg/dL — ABNORMAL HIGH (ref 0.3–1.2)
Total Protein: 8.3 g/dL — ABNORMAL HIGH (ref 6.5–8.1)

## 2022-12-25 LAB — RAPID URINE DRUG SCREEN, HOSP PERFORMED
Amphetamines: NOT DETECTED
Barbiturates: NOT DETECTED
Benzodiazepines: POSITIVE — AB
Cocaine: NOT DETECTED
Opiates: POSITIVE — AB
Tetrahydrocannabinol: POSITIVE — AB

## 2022-12-25 LAB — TROPONIN I (HIGH SENSITIVITY)
Troponin I (High Sensitivity): 61 ng/L — ABNORMAL HIGH (ref <18)
Troponin I (High Sensitivity): 1425 ng/L (ref <18)
Troponin I (High Sensitivity): 4204 ng/L (ref <18)
Troponin I (High Sensitivity): 5696 ng/L (ref <18)

## 2022-12-25 LAB — LIPID PANEL
Cholesterol: 183 mg/dL (ref 0–200)
HDL: 47 mg/dL (ref >40)
LDL Cholesterol: 122 mg/dL — ABNORMAL HIGH (ref 0–99)
Total CHOL/HDL Ratio: 3.9 RATIO
Triglycerides: 68 mg/dL (ref <150)
VLDL: 14 mg/dL (ref 0–40)

## 2022-12-25 LAB — APTT: aPTT: 25 seconds (ref 24–36)

## 2022-12-25 LAB — SALICYLATE LEVEL: Salicylate Lvl: <7 mg/dL — ABNORMAL LOW (ref 7.0–30.0)

## 2022-12-25 LAB — ETHANOL: Alcohol, Ethyl (B): <10 mg/dL (ref <10)

## 2022-12-25 LAB — LIPASE, BLOOD: Lipase: 32 U/L (ref 11–51)

## 2022-12-25 SURGERY — LEFT HEART CATH AND CORONARY ANGIOGRAPHY
Anesthesia: LOCAL

## 2022-12-25 MED ORDER — SODIUM CHLORIDE 0.9 % IV BOLUS
1000.0000 mL | Freq: Once | INTRAVENOUS | Status: AC
  Administered 2022-12-25: 1000 mL via INTRAVENOUS

## 2022-12-25 MED ORDER — SODIUM CHLORIDE 0.9 % IV SOLN: 250.0000 mL | INTRAVENOUS | Status: DC | PRN

## 2022-12-25 MED ORDER — ONDANSETRON HCL 4 MG/2ML IJ SOLN: 4.0000 mg | Freq: Four times a day (QID) | INTRAMUSCULAR | Status: DC | PRN

## 2022-12-25 MED ORDER — SODIUM CHLORIDE 0.9 % IV SOLN: INTRAVENOUS | Status: AC

## 2022-12-25 MED ORDER — SODIUM CHLORIDE 0.9% FLUSH
3.0000 mL | Freq: Two times a day (BID) | INTRAVENOUS | Status: DC
  Administered 2022-12-25 – 2022-12-30 (×11): 3 mL via INTRAVENOUS

## 2022-12-25 MED ORDER — ASPIRIN 81 MG PO CHEW
324.0000 mg | CHEWABLE_TABLET | Freq: Once | ORAL | Status: AC
  Administered 2022-12-25: 324 mg via ORAL
  Filled 2022-12-25: qty 4

## 2022-12-25 MED ORDER — NITROGLYCERIN 1 MG/10 ML FOR IR/CATH LAB
INTRA_ARTERIAL | Status: AC
  Filled 2022-12-25: qty 10

## 2022-12-25 MED ORDER — HYDRALAZINE HCL 20 MG/ML IJ SOLN: 10.0000 mg | INTRAMUSCULAR | Status: AC | PRN

## 2022-12-25 MED ORDER — ATORVASTATIN CALCIUM 80 MG PO TABS
80.0000 mg | ORAL_TABLET | Freq: Every day | ORAL | Status: DC
  Administered 2022-12-25 – 2022-12-30 (×6): 80 mg via ORAL
  Filled 2022-12-25 (×6): qty 1

## 2022-12-25 MED ORDER — NICARDIPINE HCL IN NACL 20-0.86 MG/200ML-% IV SOLN
3.0000 mg/h | INTRAVENOUS | Status: DC
  Administered 2022-12-25: 5 mg/h via INTRAVENOUS
  Filled 2022-12-25: qty 200

## 2022-12-25 MED ORDER — ACETAMINOPHEN 325 MG PO TABS
650.0000 mg | ORAL_TABLET | ORAL | Status: DC | PRN
  Administered 2022-12-29 – 2022-12-30 (×2): 650 mg via ORAL
  Filled 2022-12-25 (×2): qty 2

## 2022-12-25 MED ORDER — CLOPIDOGREL BISULFATE 75 MG PO TABS
75.0000 mg | ORAL_TABLET | Freq: Every day | ORAL | Status: DC
  Administered 2022-12-26 – 2022-12-30 (×5): 75 mg via ORAL
  Filled 2022-12-25 (×5): qty 1

## 2022-12-25 MED ORDER — HYDROMORPHONE HCL 1 MG/ML IJ SOLN
1.0000 mg | Freq: Once | INTRAMUSCULAR | Status: AC
  Administered 2022-12-25: 1 mg via INTRAVENOUS
  Filled 2022-12-25: qty 1

## 2022-12-25 MED ORDER — SODIUM CHLORIDE 0.9 % IV SOLN
INTRAVENOUS | Status: AC | PRN
  Administered 2022-12-25: 10 mL/h via INTRAVENOUS

## 2022-12-25 MED ORDER — ONDANSETRON HCL 4 MG/2ML IJ SOLN
4.0000 mg | Freq: Once | INTRAMUSCULAR | Status: AC
  Administered 2022-12-25: 4 mg via INTRAVENOUS

## 2022-12-25 MED ORDER — HEPARIN SODIUM (PORCINE) 5000 UNIT/ML IJ SOLN
4000.0000 [IU] | Freq: Once | INTRAMUSCULAR | Status: AC
  Administered 2022-12-25: 4000 [IU] via INTRAVENOUS
  Filled 2022-12-25: qty 1

## 2022-12-25 MED ORDER — HYDROMORPHONE HCL 1 MG/ML IJ SOLN: 1.0000 mg | INTRAMUSCULAR | Status: DC | PRN

## 2022-12-25 MED ORDER — HEPARIN (PORCINE) IN NACL 1000-0.9 UT/500ML-% IV SOLN
INTRAVENOUS | Status: DC | PRN
  Administered 2022-12-25 (×2): 500 mL

## 2022-12-25 MED ORDER — ONDANSETRON HCL 4 MG/2ML IJ SOLN
INTRAMUSCULAR | Status: AC
  Filled 2022-12-25: qty 2

## 2022-12-25 MED ORDER — HYDRALAZINE HCL 20 MG/ML IJ SOLN
10.0000 mg | Freq: Once | INTRAMUSCULAR | Status: AC
  Administered 2022-12-25: 10 mg via INTRAVENOUS
  Filled 2022-12-25: qty 1

## 2022-12-25 MED ORDER — VERAPAMIL HCL 2.5 MG/ML IV SOLN
INTRAVENOUS | Status: AC
  Filled 2022-12-25: qty 2

## 2022-12-25 MED ORDER — LIDOCAINE HCL (PF) 1 % IJ SOLN
INTRAMUSCULAR | Status: AC
  Filled 2022-12-25: qty 30

## 2022-12-25 MED ORDER — ENOXAPARIN SODIUM 80 MG/0.8ML IJ SOSY
80.0000 mg | PREFILLED_SYRINGE | Freq: Two times a day (BID) | INTRAMUSCULAR | Status: DC
  Administered 2022-12-25 – 2022-12-26 (×3): 80 mg via SUBCUTANEOUS
  Filled 2022-12-25 (×5): qty 0.8

## 2022-12-25 MED ORDER — LIDOCAINE HCL (PF) 1 % IJ SOLN
INTRAMUSCULAR | Status: DC | PRN
  Administered 2022-12-25: 2 mL via INTRADERMAL

## 2022-12-25 MED ORDER — HYDRALAZINE HCL 20 MG/ML IJ SOLN: 5.0000 mg | Freq: Once | INTRAMUSCULAR | Status: DC

## 2022-12-25 MED ORDER — SODIUM CHLORIDE 0.9% FLUSH: 3.0000 mL | INTRAVENOUS | Status: DC | PRN

## 2022-12-25 MED ORDER — VERAPAMIL HCL 2.5 MG/ML IV SOLN
INTRAVENOUS | Status: DC | PRN
  Administered 2022-12-25: 10 mL via INTRA_ARTERIAL

## 2022-12-25 MED ORDER — CLOPIDOGREL BISULFATE 300 MG PO TABS
600.0000 mg | ORAL_TABLET | Freq: Every day | ORAL | Status: DC
  Administered 2022-12-25: 600 mg via ORAL
  Filled 2022-12-25: qty 2

## 2022-12-25 MED ORDER — ALBUTEROL SULFATE (2.5 MG/3ML) 0.083% IN NEBU: 2.5000 mg | INHALATION_SOLUTION | Freq: Four times a day (QID) | RESPIRATORY_TRACT | Status: DC | PRN

## 2022-12-25 MED ORDER — ALBUTEROL SULFATE HFA 108 (90 BASE) MCG/ACT IN AERS: 1.0000 | INHALATION_SPRAY | Freq: Four times a day (QID) | RESPIRATORY_TRACT | Status: DC | PRN

## 2022-12-25 MED ORDER — IOHEXOL 350 MG/ML SOLN
INTRAVENOUS | Status: DC | PRN
  Administered 2022-12-25: 55 mL

## 2022-12-25 MED ORDER — CHLORHEXIDINE GLUCONATE CLOTH 2 % EX PADS
6.0000 | MEDICATED_PAD | Freq: Every day | CUTANEOUS | Status: DC
  Administered 2022-12-25 – 2022-12-27 (×5): 6 via TOPICAL

## 2022-12-25 MED ORDER — AMLODIPINE BESYLATE 5 MG PO TABS
5.0000 mg | ORAL_TABLET | Freq: Every day | ORAL | Status: DC
  Administered 2022-12-25 – 2022-12-26 (×2): 5 mg via ORAL
  Filled 2022-12-25 (×2): qty 1

## 2022-12-25 MED ORDER — LABETALOL HCL 5 MG/ML IV SOLN: 10.0000 mg | INTRAVENOUS | Status: AC | PRN

## 2022-12-25 MED ORDER — IOHEXOL 350 MG/ML SOLN
100.0000 mL | Freq: Once | INTRAVENOUS | Status: AC | PRN
  Administered 2022-12-25: 100 mL via INTRAVENOUS

## 2022-12-25 SURGICAL SUPPLY — 13 items
CATH INFINITI 5FR ANG PIGTAIL (CATHETERS) IMPLANT
CATH OPTITORQUE TIG 4.0 5F (CATHETERS) IMPLANT
DEVICE RAD TR BAND REGULAR (VASCULAR PRODUCTS) IMPLANT
ELECT DEFIB PAD ADLT CADENCE (PAD) IMPLANT
GLIDESHEATH SLEND SS 6F .021 (SHEATH) IMPLANT
GUIDEWIRE INQWIRE 1.5J.035X260 (WIRE) IMPLANT
INQWIRE 1.5J .035X260CM (WIRE) ×1
KIT ENCORE 26 ADVANTAGE (KITS) IMPLANT
KIT HEART LEFT (KITS) ×1 IMPLANT
PACK CARDIAC CATHETERIZATION (CUSTOM PROCEDURE TRAY) ×1 IMPLANT
SHEATH PROBE COVER 6X72 (BAG) IMPLANT
TRANSDUCER W/STOPCOCK (MISCELLANEOUS) ×1 IMPLANT
TUBING CIL FLEX 10 FLL-RA (TUBING) ×1 IMPLANT

## 2022-12-25 NOTE — Progress Notes (Signed)
   12/25/22 0758  Spiritual Encounters  Type of Visit Initial  Referral source Code page  Reason for visit Code  OnCall Visit Yes   Chaplain responded to Code STEMI. PT unavailable as medical team provides care.  No support persons present.  Chaplain remains available by page

## 2022-12-25 NOTE — ED Triage Notes (Signed)
Patient woke up tonight belching and C/O ABD pain 10/10, patient refusing to answer triage question, patient also failed to answer questions for EMS

## 2022-12-25 NOTE — H&P (Signed)
Cardiology Admission History and Physical   Patient ID: Stephen Shepard MRN: [id number]; DOB: 24-Nov-1990   Admission date: 12/25/2022  PCP:  Patient, [name]   [facility] Providers Cardiologist:  None        Chief Complaint:  CHEST & ABDOMINAL PAIN  Patient Profile:   Stephen Shepard is a 32 y.o. male with history of polysubstance abuse who is being seen 12/25/2022 for the evaluation of chest pain with ST elevations concerning for inferoposterior STEMI.Marland Kitchen  History of Present Illness:   Stephen Shepard has a complicated history of IV drug abuse and pseudoseizure.  He stated that yesterday (12/24/2022, he used heroin (stating that he did not use any cocaine), but did not use any this morning.  He presented to [facility] via EMS with complaints of epigastric and lower chest pain.  Upon initial evaluation he denied any illicit drug use. He stated that he had onset of symptoms at roughly midnight between 5/26-5/27/2024.  He described the pain mostly is epigastric pain that began actually late afternoon on 5/26.  He felt much better after having a bowel movement.  However symptoms recurred again at midnight and decided to call EMS.  Unfortunately per EMS, he was somewhat uncooperative providing information on HPI.  While in the ER, initial evaluation showed relatively normal EKG, but second troponin level came back at 61 therefore repeat EKG (at 4:46 AM he just was dreaming) was checked that showed only borderline EKG changes.  He then started describing that the epigastric pain then moved up underneath his left chest and was up to 8 or 9 out of 10.  Follow-up EKG done just after 6 AM showed more prominent inferior elevations with reciprocal changes in the septal leads suggestive of inferior posterior MI.  At this point code STEMI was called.  System delayed due to patient was significantly hypertensive, clinical symptoms partly because he was not very responsive to questions.  He  came in with abdominal pain and then started noticing chest pain.  It was more periumbilical pain that then radiated up to the chest throughout the morning.  Symptom's actually began at midnight, and got steadily worse.  Very poor historian. He was profoundly hypertensive with pressures in the 170s over 90s to 100s, therefore there is concern based on his history of polysubstance abuse that this could be potentially related to a dissection.  Therefore he was undergoing CT angiogram of chest abdomen pelvis to assess for possible aortic dissection.  This led to a roughly 25 to 30-minute delay in proceeding to the Cath Lab.  While in the emergency room he had significant nausea with 2 bouts of emesis.  Otherwise not able to obtain any review of symptoms.  In the ER he received 4000 units of IV heparin, was initially started on nicardipine drip for blood pressures in the 160s and repeat blood pressures went down to the 115 range therefore was stopped.  He was also given 324 mg aspirin despite having allergy listed.  Was not given nitroglycerin because of listed allergy/intolerance.  He was given Dilaudid for pain.  Upon my arrival he was minimally responsive to questions but was awake he would occasionally nod yes and no.  Not very verbal.  Based on EKG findings and his ongoing to 8 out of 10 pain, we decided proceed to the cardiac catheterization lab once it was confirmed that he did not have any evidence of dissection.  Upon arrival to the Cath Lab,  his EKG on the telemetry leads appear to be notably improved and he was chest pain-free. During the cardiac catheterization his troponin levels came back greater than 1000.  Past Medical History:  Diagnosis Date   Anxiety    Asthma    Fever and positive blood culture 01/31/2022   Seizure-like activity (HCC) 01/30/2022   Seizures (HCC)    Substance abuse (HCC)     Past Surgical History:  Procedure Laterality Date   I & D EXTREMITY Right 12/15/2021    Procedure: IRRIGATION AND DEBRIDEMENT RIGHT HAND AND SHOULDER;  Surgeon: Bradly Bienenstock, MD;  Location: [facility];  Service: Orthopedics;  Laterality: Right;  upper and lower arm   TEE WITHOUT CARDIOVERSION N/A 12/21/2021   Procedure: TRANSESOPHAGEAL ECHOCARDIOGRAM (TEE);  Surgeon: Little Ishikawa, MD;  Location: [facility];  Service: Cardiovascular;  Laterality: N/A;     Medications Prior to Admission: Prior to Admission medications   Medication Sig Start Date End Date Taking? Authorizing Provider  albuterol (VENTOLIN HFA) 108 (90 Base) MCG/ACT inhaler Inhale 1-2 puffs into the lungs every 6 (six) hours as needed for wheezing or shortness of breath. 08/05/22   Linwood Dibbles, MD  benzonatate (TESSALON) 100 MG capsule Take 1 capsule (100 mg total) by mouth every 8 (eight) hours. Patient not taking: Reported on 12/21/2022 08/05/22   Linwood Dibbles, MD  Guaifenesin 1200 MG TB12 Take 1 tablet (1,200 mg total) by mouth 2 (two) times daily at 10 AM and 5 PM. Patient not taking: Reported on 12/21/2022 08/05/22   Linwood Dibbles, MD  oxyCODONE-acetaminophen (PERCOCET) 10-325 MG tablet Take 2 tablets by mouth once.    [provider]     Allergies:    Allergies  Allergen Reactions   Keppra [Levetiracetam] Anaphylaxis   Motrin [Ibuprofen] Anaphylaxis   Nitrostat [Nitroglycerin] Shortness Of Breath, Palpitations and Other (See Comments)    Severe chest pain    Wasp Venom Protein Hives and Swelling    Social History:   Social History   Socioeconomic History   Marital status: Significant Other    Spouse name: Not on file   Number of children: Not on file   Years of education: Not on file   Highest education level: Not on file  Occupational History   Not on file  Tobacco Use   Smoking status: Every Day    Packs/day: 1.00    Years: 17.00    Additional pack years: 0.00    Total pack years: 17.00    Types: Cigarettes    Passive exposure: Current   Smokeless tobacco: Never  Vaping Use    Vaping Use: Never used  Substance and Sexual Activity   Alcohol use: Never   Drug use: Yes    Types: IV   Sexual activity: Yes  Other Topics Concern   Not on file  Social History Narrative   Not on file   Social Determinants of Health   Financial Resource Strain: Not on file  Food Insecurity: Not on file  Transportation Needs: Not on file  Physical Activity: Not on file  Stress: Not on file  Social Connections: Not on file  Intimate Partner Violence: Not on file    Family History:   Mother - MI @ age 74 The patient's family history is not on file.    ROS:  Please see the history of present illness.  All other ROS reviewed and negative.     Physical Exam/Data:   Vitals:   12/25/22 4098 12/25/22 1191  12/25/22 0645 12/25/22 0650  BP: (!) 182/124 (!) 163/106 (!) 142/93   Pulse:  97 91 91  Resp:  (!) 28 (!) 29 (!) 23  Temp:      TempSrc:      SpO2:  100% 100% 99%  Weight: 79.4 kg     Height: 6\' 4"  (1.93 m)       Intake/Output Summary (Last 24 hours) at 12/25/2022 0708 Last data filed at 12/25/2022 0654 Gross per 24 hour  Intake 508.3 ml  Output --  Net 508.3 ml      12/25/2022    6:16 AM 08/01/2022    6:09 PM 07/28/2022   12:42 PM  Last 3 Weights  Weight (lbs) 175 lb 176 lb 12.9 oz 177 lb  Weight (kg) 79.379 kg 80.2 kg 80.287 kg     Body mass index is 21.3 kg/m.  General:  Well nourished, well developed, in moderate distress-minimally responsive, shakes his head yes and no.  Says he is cold HEENT: normal Neck: no bruit or JVD Vascular: No carotid bruits; Distal pulses 2+ bilaterally   Cardiac:  normal S1, S2; RRR; no murmur rubs or gallops Lungs:  clear to auscultation bilaterally, no wheezing, rhonchi or rales  Abd: soft, nontender, no hepatomegaly  Ext: no clubbing/cyanosis or edema; track marks of IV drug use noted Musculoskeletal:  No deformities, BUE and BLE strength normal and equal Skin: warm and dry  Neuro:  CNs 2-12 intact, no focal abnormalities  noted Psych: Somewhat content/flat affect.  Minimally responsive.  Does not speak in full sentences.   EKGs:   The ECG that was done at 4:36 AM was personally reviewed and demonstrates sinus rhythm, rate 82 bpm, borderline criteria for inferior ST elevation.  PVC => at this point, the patient was not indicated he was having chest pain, noted he was having abdominal pain. Follow-up EKG at 5:57 AM now showed more prominent ST elevation 2 to 3 mm in 2 3 and aVF with elevation also in V5 and V6.  Reciprocal changes in V1 V2 and V3 suggestive of possible inferoposterior STEMI. => Code STEMI called  Relevant CV Studies: None  Laboratory Data:  High Sensitivity Troponin:   Recent Labs  Lab 12/25/22 0348  TROPONINIHS 61*      Chemistry Recent Labs  Lab 12/21/22 2235 12/25/22 0348  NA 139 139  K 3.7 3.6  CL 102 102  CO2 28 26  GLUCOSE 85 116*  BUN 15 16  CREATININE 1.21 1.18  CALCIUM 9.6 9.5  GFRNONAA >60 >60  ANIONGAP 9 11    Recent Labs  Lab 12/25/22 0348  PROT 8.3*  ALBUMIN 4.3  AST 40  ALT 43  ALKPHOS 75  BILITOT 1.3*   Lipids No results for input(s): "CHOL", "TRIG", "HDL", "LABVLDL", "LDLCALC", "CHOLHDL" in the last 168 hours. Hematology Recent Labs  Lab 12/21/22 2235 12/25/22 0348  WBC 10.2 10.5  RBC 4.62 5.36  HGB 13.2 15.3  HCT 40.3 45.2  MCV 87.2 84.3  MCH 28.6 28.5  MCHC 32.8 33.8  RDW 13.8 13.2  PLT 206 223   Thyroid No results for input(s): "TSH", "FREET4" in the last 168 hours. BNPNo results for input(s): "BNP", "PROBNP" in the last 168 hours.  DDimer No results for input(s): "DDIMER" in the last 168 hours.   Radiology/Studies:  CT Angio Chest/Abd/Pel for Dissection W and/or Wo Contrast  Result Date: 12/25/2022 CLINICAL DATA:  Acute aortic syndrome suspected EXAM: CT ANGIOGRAPHY CHEST, ABDOMEN AND PELVIS  TECHNIQUE: Abdominal CT 07/28/2022.  Chest CT 12/15/2021 Multidetector CT imaging through the chest, abdomen and pelvis was performed using  the standard protocol during bolus administration of intravenous contrast. Multiplanar reconstructed images and MIPs were obtained and reviewed to evaluate the vascular anatomy. RADIATION DOSE REDUCTION: This exam was performed according to the departmental dose-optimization program which includes automated exposure control, adjustment of the mA and/or kV according to patient size and/or use of iterative reconstruction technique. CONTRAST:  OMNIPAQUE IOHEXOL 350 MG/ML SOLN COMPARISON:  None Available. FINDINGS: CTA CHEST FINDINGS Cardiovascular: Preferential opacification of the thoracic aorta. No evidence of thoracic aortic aneurysm or dissection. Normal heart size. No pericardial effusion. Mediastinum/Nodes: No hematoma or adenopathy Lungs/Pleura: There is no edema, consolidation, effusion, or pneumothorax. Musculoskeletal: No acute finding or cause for chest pain Review of the MIP images confirms the above findings. CTA ABDOMEN AND PELVIS FINDINGS VASCULAR Aorta: Negative for dissection or aneurysm.  No stenosis. Celiac: Large branches with some tortuosity accentuated for age. No stenosis, aneurysm, or beading. SMA: Replaced right hepatic artery. Prominent size arteries for age with some tortuosity. No stenosis, beading, or aneurysm Renals: Smoothly contoured and widely patent. Early branching right renal artery. IMA: Smoothly contoured and patent Inflow: Unremarkable Veins: Unremarkable in the arterial phase Review of the MIP images confirms the above findings. NON-VASCULAR Hepatobiliary: No focal liver abnormality.No evidence of biliary obstruction or stone. Pancreas: Unremarkable. Spleen: Unremarkable. Adrenals/Urinary Tract: Negative adrenals. No hydronephrosis or stone. Unremarkable bladder. Stomach/Bowel:  No obstruction. No appendicitis. Vascular/Lymphatic: No acute vascular abnormality. No mass or adenopathy. Reproductive:No pathologic findings. Other: No ascites or pneumoperitoneum.  Musculoskeletal: No acute abnormalities. Review of the MIP images confirms the above findings. IMPRESSION: Negative for acute aortic syndrome.  No specific cause for symptoms. Electronically Signed   By: Tiburcio Pea M.D.   On: 12/25/2022 06:47   CT Head Wo Contrast  Result Date: 12/25/2022 CLINICAL DATA:  Mental status change with unknown cause EXAM: CT HEAD WITHOUT CONTRAST TECHNIQUE: Contiguous axial images were obtained from the base of the skull through the vertex without intravenous contrast. RADIATION DOSE REDUCTION: This exam was performed according to the departmental dose-optimization program which includes automated exposure control, adjustment of the mA and/or kV according to patient size and/or use of iterative reconstruction technique. COMPARISON:  Four days ago FINDINGS: Brain: No evidence of acute infarction, hemorrhage, hydrocephalus, extra-axial collection or mass lesion/mass effect. Vascular: No hyperdense vessel or unexpected calcification. Skull: Normal. Negative for fracture or focal lesion. Sinuses/Orbits: No acute finding. Other: Band of streak artifact across the ventral brain. IMPRESSION: Negative motion degraded head CT. Electronically Signed   By: Tiburcio Pea M.D.   On: 12/25/2022 04:10   DG Chest 1 View  Result Date: 12/25/2022 CLINICAL DATA:  Hiccups and chest pain EXAM: CHEST  1 VIEW COMPARISON:  08/05/2022 FINDINGS: The heart size and mediastinal contours are within normal limits. Both lungs are clear. The visualized skeletal structures are unremarkable. IMPRESSION: No active disease. Electronically Signed   By: Deatra Robinson M.D.   On: 12/25/2022 03:41     Assessment and Plan:   ACS/inferior posterior STEMI (transient EKG changes): Pain-free and EKG improved after nicardipine initiated and heparin administered. Cardiac cath revealed angiographically normal coronary arteries but with suggestion of inferior hypokinesis on LV gram Suspect that he may be well of had  coronary spasm given the transient hypertension associated with chest pain, this is when he had EKG changes.  When blood pressure i was treated with IV  nicardipine and heparin was administered, he was pain-free.  I suspect that he may have had either spasm or in situ thrombus that was cleared with the initial bolus of heparin. Will check 2D echo to evaluate wall motion-poor imaging on LV gram. Cycle least 2 more troponin levels Anticoagulate with treatment dose Lovenox for 48 hours Start amlodipine 5 mg daily Given positive troponins will load with 600 mg clopidogrel today and 75 mg daily beginning tomorrow. Start atorvastatin No beta-blocker due to concern for PSA and the potential for cocaine/crack Polysubstance Abuse -> not positive for cocaine during this hospitalization making the case for laced heroin unlikely.  However coronary spasm still remains high on the differential. Will try to avoid providing narcotics or benzodiazepines if at all possible. He may benefit from Child psychotherapist psychiatric counseling   Risk Assessment/Risk Scores:    TIMI Risk Score for ST  Elevation MI:   The patient's TIMI risk score is 0, which indicates a 0.8% risk of all cause mortality at 30 days.        Severity of Illness: The appropriate patient status for this patient is INPATIENT. Inpatient status is judged to be reasonable and necessary in order to provide the required intensity of service to ensure the patient's safety. The patient's presenting symptoms, physical exam findings, and initial radiographic and laboratory data in the context of their chronic comorbidities is felt to place them at high risk for further clinical deterioration. Furthermore, it is not anticipated that the patient will be medically stable for discharge from the hospital within 2 midnights of admission.   * I certify that at the point of admission it is my clinical judgment that the patient will require inpatient hospital care  spanning beyond 2 midnights from the point of admission due to high intensity of service, high risk for further deterioration and high frequency of surveillance required.*   For questions or updates, please contact [facility] Please consult [email or web address] for contact info under     Signed, Bryan Lemma, MD  12/25/2022 7:08 AM

## 2022-12-25 NOTE — ED Notes (Addendum)
Patient to CT, with this nurse,Tyler, RN, and Dr. Pilar Plate. Zoll and transport monitor in use.

## 2022-12-25 NOTE — ED Notes (Signed)
Patient continues to remove monitoring equipment, patient educated on the need for continuous cardiac and BP monitoring. Cardiac electrodes, BP and pulse ox cord replaced.

## 2022-12-25 NOTE — ED Notes (Signed)
Notified W. Uvaldo Rising, PA the patient's BP continues to remain hypertensive, no new orders at this time

## 2022-12-25 NOTE — Progress Notes (Signed)
  Echocardiogram 2D Echocardiogram has been performed.  Janalyn Harder 12/25/2022, 4:06 PM

## 2022-12-25 NOTE — ED Notes (Addendum)
EKG changes noted, EKG obtained at 0436, printed EKG given to Dr. Pilar Plate, No new orders

## 2022-12-25 NOTE — ED Notes (Signed)
Patient to CT.

## 2022-12-25 NOTE — ED Notes (Signed)
Dr. Herbie Baltimore present in CT

## 2022-12-25 NOTE — ED Provider Notes (Signed)
Stephen Shepard AT [facility] Provider Note   CSN: [id number] Arrival date & time: 12/25/22  0247     History  Chief Complaint  Patient presents with   Abdominal Pain    Stephen Shepard is a 32 y.o. male.  HPI   Patient with medical history including seizure-like activity, intravenous drug use, presented with complaints of abdominal pain.  Patient is very difficult historian and would not participate in HPI, states he has stomach pain states is in the "middle" it started yesterday has been constant, states he did have a bowel movement yesterday, would not answer if he was having bloody emesis or coffee-ground emesis would not answer if he had bloody stools or dark tarry stools.  He does state that he has chest pain which is also in the"middle".  He denies any illicit drug use.  Per EMS as well as nursing staff patient was uncooperative during their examination as well as HPI collecting.  Reviewed his chart history of intravenous drug use, has had tenosynovitis as well as positive blood cultures for Staph epidermidis and step capitis, patient was cleared by ID after he completed a 7 days course of linzoild.  Patient was admitted in July of last year for catatonic and psychosis felt like this is likely opioid withdraws, he did have seizure-like activities which he is evaluated had negative EEGs as well as MRI.  Home Medications Prior to Admission medications   Medication Sig Start Date End Date Taking? Authorizing Provider  albuterol (VENTOLIN HFA) 108 (90 Base) MCG/ACT inhaler Inhale 1-2 puffs into the lungs every 6 (six) hours as needed for wheezing or shortness of breath. 08/05/22   Linwood Dibbles, MD  benzonatate (TESSALON) 100 MG capsule Take 1 capsule (100 mg total) by mouth every 8 (eight) hours. Patient not taking: Reported on 12/21/2022 08/05/22   Linwood Dibbles, MD  Guaifenesin 1200 MG TB12 Take 1 tablet (1,200 mg total) by mouth 2 (two) times daily at 10 AM and 5  PM. Patient not taking: Reported on 12/21/2022 08/05/22   Linwood Dibbles, MD  oxyCODONE-acetaminophen (PERCOCET) 10-325 MG tablet Take 2 tablets by mouth once.    [provider]      Allergies    Keppra [levetiracetam], Motrin [ibuprofen], Nitrostat [nitroglycerin], and Wasp venom protein    Review of Systems   Review of Systems  Constitutional:  Negative for chills and fever.  Respiratory:  Negative for shortness of breath.   Cardiovascular:  Negative for chest pain.  Gastrointestinal:  Positive for abdominal pain.  Neurological:  Negative for headaches.    Physical Exam Updated Vital Signs BP (!) 142/93   Pulse 91   Temp 99.1 F (37.3 C) (Oral)   Resp (!) 23   Ht 6\' 4"  (1.93 m)   Wt 79.4 kg   SpO2 99%   BMI 21.30 kg/m  Physical Exam Vitals and nursing note reviewed.  Constitutional:      General: He is not in acute distress.    Appearance: He is not ill-appearing.  HENT:     Head: Normocephalic and atraumatic.     Nose: No congestion.  Eyes:     Extraocular Movements: Extraocular movements intact.     Conjunctiva/sclera: Conjunctivae normal.     Pupils: Pupils are equal, round, and reactive to light.  Cardiovascular:     Rate and Rhythm: Normal rate and regular rhythm.     Pulses: Normal pulses.     Heart sounds: No murmur heard.  No friction rub. No gallop.  Pulmonary:     Effort: No respiratory distress.     Breath sounds: No wheezing, rhonchi or rales.     Comments: Lung sounds clear bilaterally, does have hiccups, which will stop and start abruptly. Abdominal:     Palpations: Abdomen is soft.     Tenderness: There is abdominal tenderness. There is guarding. There is no right CVA tenderness or left CVA tenderness.     Comments: Abdomen nondistended, soft, he had generalized abdominal tenderness but seems to be most tenderness right upper quadrant, positive Murphy sign with guarding, no rebound has or peritoneal sign.  Skin:    General: Skin is warm and  dry.  Neurological:     Mental Status: He is alert.     Comments: Patient was noncompliant during my examination, he would doze off and then wake back up, when I asked him to sit up he would not, when I sat myself he tried to push himself back down, there is no noted facial asymmetry no noted unilateral weakness.  patient responds appropriately to painful stimuli. When he is not directed patient is moving his upper and lower extremities to adjust himself in bed,  he  appears to have purposeful movements.  Psychiatric:        Mood and Affect: Mood normal.     ED Results / Procedures / Treatments   Labs (all labs ordered are listed, but only abnormal results are displayed) Labs Reviewed  COMPREHENSIVE METABOLIC PANEL - Abnormal; Notable for the following components:      Result Value   Glucose, Bld 116 (*)    Total Protein 8.3 (*)    Total Bilirubin 1.3 (*)    All other components within normal limits  SALICYLATE LEVEL - Abnormal; Notable for the following components:   Salicylate Lvl <7.0 (*)    All other components within normal limits  CBC WITH DIFFERENTIAL/PLATELET - Abnormal; Notable for the following components:   Neutro Abs 8.4 (*)    All other components within normal limits  URINALYSIS, ROUTINE W REFLEX MICROSCOPIC - Abnormal; Notable for the following components:   Color, Urine AMBER (*)    APPearance HAZY (*)    Ketones, ur 20 (*)    Protein, ur 30 (*)    Bacteria, UA RARE (*)    All other components within normal limits  RAPID URINE DRUG SCREEN, HOSP PERFORMED - Abnormal; Notable for the following components:   Opiates POSITIVE (*)    Benzodiazepines POSITIVE (*)    Tetrahydrocannabinol POSITIVE (*)    All other components within normal limits  TROPONIN I (HIGH SENSITIVITY) - Abnormal; Notable for the following components:   Troponin I (High Sensitivity) 61 (*)    All other components within normal limits  ETHANOL  LIPASE, BLOOD  HEMOGLOBIN A1C  PROTIME-INR  APTT   LIPID PANEL  I-STAT CG4 LACTIC ACID, ED  TROPONIN I (HIGH SENSITIVITY)    EKG None  Radiology CT Angio Chest/Abd/Pel for Dissection W and/or Wo Contrast  Result Date: 12/25/2022 CLINICAL DATA:  Acute aortic syndrome suspected EXAM: CT ANGIOGRAPHY CHEST, ABDOMEN AND PELVIS TECHNIQUE: Abdominal CT 07/28/2022.  Chest CT 12/15/2021 Multidetector CT imaging through the chest, abdomen and pelvis was performed using the standard protocol during bolus administration of intravenous contrast. Multiplanar reconstructed images and MIPs were obtained and reviewed to evaluate the vascular anatomy. RADIATION DOSE REDUCTION: This exam was performed according to the departmental dose-optimization program which includes automated exposure control, adjustment  of the mA and/or kV according to patient size and/or use of iterative reconstruction technique. CONTRAST:  OMNIPAQUE IOHEXOL 350 MG/ML SOLN COMPARISON:  None Available. FINDINGS: CTA CHEST FINDINGS Cardiovascular: Preferential opacification of the thoracic aorta. No evidence of thoracic aortic aneurysm or dissection. Normal heart size. No pericardial effusion. Mediastinum/Nodes: No hematoma or adenopathy Lungs/Pleura: There is no edema, consolidation, effusion, or pneumothorax. Musculoskeletal: No acute finding or cause for chest pain Review of the MIP images confirms the above findings. CTA ABDOMEN AND PELVIS FINDINGS VASCULAR Aorta: Negative for dissection or aneurysm.  No stenosis. Celiac: Large branches with some tortuosity accentuated for age. No stenosis, aneurysm, or beading. SMA: Replaced right hepatic artery. Prominent size arteries for age with some tortuosity. No stenosis, beading, or aneurysm Renals: Smoothly contoured and widely patent. Early branching right renal artery. IMA: Smoothly contoured and patent Inflow: Unremarkable Veins: Unremarkable in the arterial phase Review of the MIP images confirms the above findings. NON-VASCULAR Hepatobiliary:  No focal liver abnormality.No evidence of biliary obstruction or stone. Pancreas: Unremarkable. Spleen: Unremarkable. Adrenals/Urinary Tract: Negative adrenals. No hydronephrosis or stone. Unremarkable bladder. Stomach/Bowel:  No obstruction. No appendicitis. Vascular/Lymphatic: No acute vascular abnormality. No mass or adenopathy. Reproductive:No pathologic findings. Other: No ascites or pneumoperitoneum. Musculoskeletal: No acute abnormalities. Review of the MIP images confirms the above findings. IMPRESSION: Negative for acute aortic syndrome.  No specific cause for symptoms. Electronically Signed   By: Tiburcio Pea M.D.   On: 12/25/2022 06:47   CT Head Wo Contrast  Result Date: 12/25/2022 CLINICAL DATA:  Mental status change with unknown cause EXAM: CT HEAD WITHOUT CONTRAST TECHNIQUE: Contiguous axial images were obtained from the base of the skull through the vertex without intravenous contrast. RADIATION DOSE REDUCTION: This exam was performed according to the departmental dose-optimization program which includes automated exposure control, adjustment of the mA and/or kV according to patient size and/or use of iterative reconstruction technique. COMPARISON:  Four days ago FINDINGS: Brain: No evidence of acute infarction, hemorrhage, hydrocephalus, extra-axial collection or mass lesion/mass effect. Vascular: No hyperdense vessel or unexpected calcification. Skull: Normal. Negative for fracture or focal lesion. Sinuses/Orbits: No acute finding. Other: Band of streak artifact across the ventral brain. IMPRESSION: Negative motion degraded head CT. Electronically Signed   By: Tiburcio Pea M.D.   On: 12/25/2022 04:10   DG Chest 1 View  Result Date: 12/25/2022 CLINICAL DATA:  Hiccups and chest pain EXAM: CHEST  1 VIEW COMPARISON:  08/05/2022 FINDINGS: The heart size and mediastinal contours are within normal limits. Both lungs are clear. The visualized skeletal structures are unremarkable. IMPRESSION:  No active disease. Electronically Signed   By: Deatra Robinson M.D.   On: 12/25/2022 03:41    Procedures .Critical Care  Performed by: Carroll Sage, PA-C Authorized by: Carroll Sage, PA-C   Critical care provider statement:    Critical care time (minutes):  30   Critical care time was exclusive of:  Separately billable procedures and treating other patients   Critical care was time spent personally by me on the following activities:  Development of treatment plan with patient or surrogate, discussions with consultants, evaluation of patient's response to treatment, examination of patient, ordering and review of laboratory studies, ordering and review of radiographic studies, ordering and performing treatments and interventions, pulse oximetry, re-evaluation of patient's condition and review of old charts   I assumed direction of critical care for this patient from another provider in my specialty: no     Care discussed  with: admitting provider       Medications Ordered in ED Medications  nicardipine (CARDENE) 20mg  in 0.86% saline IV infusion (0.1 mg/ml) (0 mg/hr Intravenous Stopped 12/25/22 0654)  nitroGLYCERIN 100 mcg/mL intra-arterial injection (has no administration in time range)  sodium chloride 0.9 % bolus 1,000 mL (0 mLs Intravenous Stopped 12/25/22 0640)  hydrALAZINE (APRESOLINE) injection 10 mg (10 mg Intravenous Given 12/25/22 0616)  ondansetron (ZOFRAN) injection 4 mg (4 mg Intravenous Given 12/25/22 0639)  HYDROmorphone (DILAUDID) injection 1 mg (1 mg Intravenous Given 12/25/22 0639)  iohexol (OMNIPAQUE) 350 MG/ML injection 100 mL (100 mLs Intravenous Contrast Given 12/25/22 0630)  aspirin chewable tablet 324 mg (324 mg Oral Given 12/25/22 0645)  heparin injection 4,000 Units (4,000 Units Intravenous Given 12/25/22 1610)    ED Course/ Medical Decision Making/ A&P                             Medical Decision Making Amount and/or Complexity of Data  Reviewed Labs: ordered. Radiology: ordered.  Risk OTC drugs. Prescription drug management.   This patient presents to the ED for concern of abdominal pain, this involves an extensive number of treatment options, and is a complaint that carries with it a high risk of complications and morbidity.  The differential diagnosis includes cholecystitis, pancreatitis, bowel obstruction, bowel perforation, pneumothorax    Additional history obtained:  Additional history obtained from EMS External records from outside source obtained and reviewed including recent ER notes   Co morbidities that complicate the patient evaluation  Polysubstance use  Social Determinants of Health:  No primary care provider    Lab Tests:  I Ordered, and personally interpreted labs.  The pertinent results include: CBC unremarkable, CMP reveals glucose 116 total protein 8.3 total T. bili 1.3, lipase 32, ethanol unremarkable, [place] unremarkable rapid urine drug screen positive for opiates benzodiazepines, touch cannabis, for troponin is 61   Imaging Studies ordered:  I ordered imaging studies including chest x-ray, CT head, dissection study I independently visualized and interpreted imaging which showed chest x-ray as well as CT head both negative acute findings I agree with the radiologist interpretation   Cardiac Monitoring:  The patient was maintained on a cardiac monitor.  I personally viewed and interpreted the cardiac monitored which showed an underlying rhythm of: Sinus rhythm   Medicines ordered and prescription drug management:  I ordered medication including intravenous fluids I have reviewed the patients home medicines and have made adjustments as needed  Critical Interventions:  Patient meets STEMI criteria activate code STEMI, will start on heparin drip.   Reevaluation:  Presents with abdominal pain, unclear etiology will obtain screening lab workup imaging and reassess  Lab  work shows elevated troponin, EKG shows nonspecific ST elevation, reassessed the patient he is found laying on his side, again patient would not participate in evaluation, states he has vague pain in the middle, he was nontender during my evaluation, due to elevation in troponins elevated blood pressure and vague complaints of center pain will send down for dissection study.  Will provide hydralazine to manage BP, and obtain repeat EKG.  Repeat EKG shows ST elevation with reciprocal changes.  concern for possible dissection at this time will call code medical and send for CTA dissection study.   Will also consult with STEMI physician for further recommendations      Consultations Obtained:  I requested consultation with the Dr. Herbie Baltimore of cardiology,  and discussed  lab and imaging findings as well as pertinent plan - they recommend: Will bring patient to Cath Lab started on heparin, control hypertension.    Test Considered:  N/A    Rule out Suspicion for intracranial bleed at this time CT imaging is negative.  I doubt CVA presentation atypical, he is difficult to fully evaluate as he does not participate in evaluation but I do not note any unilateral weakness, he has purposeful  was full movement.  Suspicion for PE is lower at this time as he is nontachypneic nonhypoxic no unilateral leg swelling no calf tenderness, patient is low risk factors.  Suspicion for dissection is lower but cannot fully exclude we will further evaluate with CTA dissection study.  Suspicion for pancreatitis is low lipase within normal limits.  I doubt UTI or [place] denies any urinary symptoms he was negative for infection or hematuria.    Dispostion and problem list  STEMI-patient started on heparin, antihypertensive medication, will undergo catheterization and further evaluation by cardiology.            Final Clinical Impression(s) / ED Diagnoses Final diagnoses:  ST elevation myocardial infarction  (STEMI), unspecified artery [facility])    Rx / DC Orders ED Discharge Orders     None         Carroll Sage, PA-C 12/25/22 4098    Sabas Sous, MD 12/25/22 [id number]

## 2022-12-26 ENCOUNTER — Inpatient Hospital Stay (HOSPITAL_COMMUNITY): Payer: 59

## 2022-12-26 ENCOUNTER — Encounter (HOSPITAL_COMMUNITY): Payer: Self-pay | Admitting: Cardiology

## 2022-12-26 DIAGNOSIS — R9431 Abnormal electrocardiogram [ECG] [EKG]: Secondary | ICD-10-CM | POA: Insufficient documentation

## 2022-12-26 DIAGNOSIS — I25111 Atherosclerotic heart disease of native coronary artery with angina pectoris with documented spasm: Secondary | ICD-10-CM | POA: Diagnosis present

## 2022-12-26 DIAGNOSIS — F445 Conversion disorder with seizures or convulsions: Secondary | ICD-10-CM | POA: Diagnosis not present

## 2022-12-26 DIAGNOSIS — F1120 Opioid dependence, uncomplicated: Secondary | ICD-10-CM | POA: Diagnosis not present

## 2022-12-26 DIAGNOSIS — F061 Catatonic disorder due to known physiological condition: Secondary | ICD-10-CM

## 2022-12-26 DIAGNOSIS — F99 Mental disorder, not otherwise specified: Secondary | ICD-10-CM

## 2022-12-26 DIAGNOSIS — I249 Acute ischemic heart disease, unspecified: Secondary | ICD-10-CM | POA: Diagnosis not present

## 2022-12-26 LAB — CBC
HCT: 46 % (ref 39.0–52.0)
Hemoglobin: 16.1 g/dL (ref 13.0–17.0)
MCH: 29.1 pg (ref 26.0–34.0)
MCHC: 35 g/dL (ref 30.0–36.0)
MCV: 83 fL (ref 80.0–100.0)
Platelets: 158 10*3/uL (ref 150–400)
RBC: 5.54 MIL/uL (ref 4.22–5.81)
RDW: 13.5 % (ref 11.5–15.5)
WBC: 13.7 10*3/uL — ABNORMAL HIGH (ref 4.0–10.5)
nRBC: 0 % (ref 0.0–0.2)

## 2022-12-26 LAB — CK: Total CK: 390 U/L (ref 49–397)

## 2022-12-26 LAB — HIV ANTIBODY (ROUTINE TESTING W REFLEX): HIV Screen 4th Generation wRfx: NONREACTIVE

## 2022-12-26 LAB — BASIC METABOLIC PANEL
Anion gap: 16 — ABNORMAL HIGH (ref 5–15)
BUN: 22 mg/dL — ABNORMAL HIGH (ref 6–20)
CO2: 22 mmol/L (ref 22–32)
Calcium: 9.1 mg/dL (ref 8.9–10.3)
Chloride: 103 mmol/L (ref 98–111)
Creatinine, Ser: 1.13 mg/dL (ref 0.61–1.24)
GFR, Estimated: >60 mL/min (ref >60)
Glucose, Bld: 110 mg/dL — ABNORMAL HIGH (ref 70–99)
Potassium: 3.9 mmol/L (ref 3.5–5.1)
Sodium: 141 mmol/L (ref 135–145)

## 2022-12-26 LAB — HEMOGLOBIN A1C
Hgb A1c MFr Bld: 6.4 % — ABNORMAL HIGH (ref 4.8–5.6)
Mean Plasma Glucose: 137 mg/dL

## 2022-12-26 LAB — POCT ACTIVATED CLOTTING TIME: Activated Clotting Time: 255 seconds

## 2022-12-26 MED ORDER — LORAZEPAM 2 MG/ML IJ SOLN
2.0000 mg | Freq: Once | INTRAMUSCULAR | Status: AC
  Administered 2022-12-26: 2 mg via INTRAVENOUS
  Filled 2022-12-26: qty 1

## 2022-12-26 MED ORDER — LOSARTAN POTASSIUM 25 MG PO TABS
25.0000 mg | ORAL_TABLET | Freq: Every day | ORAL | Status: DC
  Administered 2022-12-26: 25 mg via ORAL
  Filled 2022-12-26: qty 1

## 2022-12-26 MED ORDER — HYDRALAZINE HCL 20 MG/ML IJ SOLN
10.0000 mg | Freq: Once | INTRAMUSCULAR | Status: AC
  Administered 2022-12-26: 10 mg via INTRAVENOUS
  Filled 2022-12-26: qty 1

## 2022-12-26 MED ORDER — LORAZEPAM 1 MG PO TABS
2.0000 mg | ORAL_TABLET | Freq: Two times a day (BID) | ORAL | Status: DC
  Administered 2022-12-27 – 2022-12-29 (×5): 2 mg via ORAL
  Filled 2022-12-26 (×6): qty 2

## 2022-12-26 MED ORDER — OLANZAPINE 10 MG IM SOLR
5.0000 mg | Freq: Two times a day (BID) | INTRAMUSCULAR | Status: DC
  Filled 2022-12-26 (×2): qty 10

## 2022-12-26 MED ORDER — ENSURE ENLIVE PO LIQD
237.0000 mL | Freq: Three times a day (TID) | ORAL | Status: DC
  Administered 2022-12-29 – 2022-12-30 (×4): 237 mL via ORAL

## 2022-12-26 MED ORDER — LORAZEPAM 2 MG/ML IJ SOLN: 2.0000 mg | Freq: Two times a day (BID) | INTRAMUSCULAR | Status: DC | PRN

## 2022-12-26 MED ORDER — AMLODIPINE BESYLATE 10 MG PO TABS
10.0000 mg | ORAL_TABLET | Freq: Every day | ORAL | Status: DC
  Administered 2022-12-27 – 2022-12-29 (×3): 10 mg via ORAL
  Filled 2022-12-26 (×3): qty 1

## 2022-12-26 MED ORDER — OLANZAPINE 5 MG PO TBDP
5.0000 mg | ORAL_TABLET | Freq: Two times a day (BID) | ORAL | Status: DC
  Administered 2022-12-26 – 2022-12-28 (×4): 5 mg via ORAL
  Filled 2022-12-26 (×6): qty 1

## 2022-12-26 MED ORDER — THIAMINE HCL 100 MG/ML IJ SOLN
250.0000 mg | Freq: Three times a day (TID) | INTRAVENOUS | Status: AC
  Administered 2022-12-27 – 2022-12-29 (×8): 250 mg via INTRAVENOUS
  Filled 2022-12-26 (×9): qty 2.5

## 2022-12-26 NOTE — Social Work (Addendum)
Patient IVC'd case # [id number] transferred to [facility].

## 2022-12-26 NOTE — Progress Notes (Signed)
Upon transfer to this unit , would just stare when ask what his name, did not say any word. When lovenox injection was  given to his lower  abdomen started cursing  saying" fuck this is painful" explained to him that he was made aware about the injection prior to giving it to him. Continue to monitor.

## 2022-12-26 NOTE — Progress Notes (Signed)
1245 Patient waking after Ativan 2mg  given. Patient anxious, irritated with nursing staff. Demanding his phone. When I got his belongings from the closet to get his phone, he grabbed his shorts and pulled out a small bag of white powdery substance and stated "you are not responsible for anything I am about to take". He would not give it to the nurse and was becoming more irritated. Distress button engaged and other staff and security in the room. Security was able to get the bag of white powdery substance from him and also 2 lighters were found in his belongings and sent to security. Psych arrived shortly after this interaction and he was then given xyprexa and  it has helped to calm him.

## 2022-12-26 NOTE — Consult Note (Signed)
Neurology Consultation Reason for Consult: Possible seizure  Referring Physician: Dr. Gasper Sells  CC: ACS, IVC Hold  History is obtained from: patient and chart review  HPI: Stephen Shepard is a 32 y.o. male with PMH significant for polysubstance abuse, catatonic behavior, pseudoseizures, multiple hospitalizations. Admitted 5/27 after loss of consciousness, falling at work and hitting his head, bystanders stated they saw some shaking after. Imaging negative. Patient complained of epigastric, then chest pain. Code STEMI was called, taken to Cath Lab. No blockages found. Inferolateral hypokinesis with elevated troponin found. Suspected coronary spasm r/t polysubstance abuse. While in the ED, patient attempted to get a white powdery substance from his belongings and security was required. Patient is under IVC hold.   Most recent [facility] was at [facility] sp[facility], imaging and EEGs were negative. Prior to that, he was admitted 07/2022 and was found to have bacteremia with staph epidermis. December 2023 admission with a WNL EEG. July 2023 admitted and started on Suboxone, MRI normal. May 2023 R hand abscess, tenosynovitis and candida fungemia with seizure-like activity.   On exam today. Patient is alert and oriented, able to tell me why he is in the hospital. He answers questions for a few minutes then stops responding. He does have some rhythmic movements of his hands, pulling at bracelet.   ROS: Unable to obtain due to suddenly no longer responding to questions during interview.    Past Medical History:  Diagnosis Date   Anxiety    Asthma    Fever and positive blood culture 01/31/2022   Seizure-like activity (HCC) 01/30/2022   Seizures (HCC)    Substance abuse (HCC)     History reviewed. No pertinent family history.   Social History:  reports that he has been smoking cigarettes. He has a 17.00 pack-year smoking history. He has been exposed to tobacco smoke. He has never used smokeless tobacco.  He reports current drug use. Drug: IV. He reports that he does not drink alcohol.   Exam: Current vital signs: BP (!) 152/107   Pulse (!) 114   Temp 97.6 F (36.4 C) (Oral)   Resp (!) 28   Ht 6\' 4"  (1.93 m)   Wt 58.9 kg   SpO2 100%   BMI 15.81 kg/m  Vital signs in last 24 hours: Temp:  [97.6 F (36.4 C)-99.5 F (37.5 C)] 97.6 F (36.4 C) (05/28 1600) Pulse Rate:  [0-121] 114 (05/28 1700) Resp:  [16-37] 28 (05/28 1700) BP: (124-171)/(73-136) 152/107 (05/28 1700) SpO2:  [85 %-100 %] 100 % (05/28 1700) Weight:  [58.9 kg] 58.9 kg (05/28 0600)   Physical Exam  Appears well-developed and well-nourished.   Neuro: Mental Status: Patient is awake, alert, oriented to person, place, month, year, and situation. Patient is able to give a clear and coherent history. No signs of aphasia or neglect. Cranial Nerves: II: Unable to assess due to patient refusing to open his eyes   III,IV, VI: Unable to assess due to patient refusing to open his eyes   V: Facial sensation is symmetric to touch VII: Facial movement is symmetric.  VIII: hearing is intact to voice X: unable to assess XI: head turn is symmetric. XII: tongue is midline  Motor: Tone is normal. Bulk is normal. 5/5 strength was present in all four extremities.  Sensory: Sensation is symmetric to light touch and temperature in the arms and legs. Cerebellar: FNF intact bilaterally Other: During attending follow up exam the patient kept eyes closed and nodded head slightly to essentially  all questions. He was nonverbal and not cooperative with CN, motor, sensory or cerebellar exam, except for slight movement of each lower extremity when asked to extend at the knees. No jerking, twitching, eyelid fluttering, oral automatisms, posturing or other seizure-like activity seen. Passive movement of BUE reveals normal tone without rigidity, cogwheeling or catalepsy.     I have reviewed labs in epic and the results pertinent to this  consultation are: WBC 13.7 HS Troponin 4204, 5696 CK 390   I have reviewed the images obtained:  CT Head: Negative CTA c/a/p: Negative for acute aortic syndrome.    Assessment: 32 y.o. male with complicated history of IV drug abuse, pseudoseizure, catatonic behavior and multiple hospitalizations who presented with abdominal pain evolving to epigastric and then chest pain. EKG showed transient inferior lateral ST elevations leading to code STEMI.  He was taken to the Cath Lab and found to have normal coronary arteries but inferolateral hypokinesis with troponin elevation up to 6000. Neurology was subsequently consulted for possibility of seizures and to evaluate for nystagmus.  - Exam is nonfocal when patient is cooperative.  - CT head: Negative motion degraded head CT.  - DDx: - Nystagmus was reported by other examiners (patient refused to open his eyes for me). This could be from a seizure or from benzodiazepine withdrawal, the latter felt to be more likely. Thiamine deficiency is also on the DDx, although thiamine level last year was normal at 192.8. - Vitamin B12 level in July of last year was normal at 611.   - Seizure risk would be high with benzodiazepine taper unless the taper is very slow. He may be intermittently catatonic from benzo withdrawal, as he was interactive with Neurology staff during initial portion of examination, then suddenly became withdrawn and refused further interaction. Further supporting this was his becoming more interactive and goal-directed after benzodiazepine administration, although in the context of agitation and oppositional behavior while attempting to use an illicit-appearing powdery substance that he produced from his personal possessions while in the ED.  - Intermittent catatonia due to an organic cause seems unlikely. His episodes of unresponsiveness are felt more likely to be behavioral in nature.   Recommendations: - rEEG to evaluate for  seizures  Will add AEDs if epileptiform discharges are seen - Would avoid Ativan, as it seems that this caused some residual paranoia during his July 2023 hospitalization. An alternate benzodiazepine during tapering is recommended.  - Thiamine level. After this is drawn, start empiric thiamine IV at 250 mg TID x 3 days, followed by oral thiamine at 100 mg qd thereafter.  - Antipsychotic medication regimen per Psychiatry service.    Pt seen by Neuro NP/APP and later by MD.  Lynnae January, DNP, AGACNP-BC [facility]   I have seen and examined the patient. I have formulated the assessment and recommendations. 32 y.o. male with complicated history of IV drug abuse, pseudoseizure, catatonic behavior and multiple hospitalizations who presented with abdominal pain evolving to chest pain. He has been intermittently exhibiting catatonic-like behavior as described in detail above. Exam while semi-cooperative is nonfocal. Recommendations as above.  Electronically signed: Dr. Caryl Pina

## 2022-12-26 NOTE — Progress Notes (Signed)
Night shift coverage MD notified of patient's refusal to take scheduled medications.

## 2022-12-26 NOTE — Progress Notes (Signed)
Rounding Note    Patient Name: Stephen Shepard Date of Encounter: 12/26/2022  [facility] Cardiologist: None new  Patient Profile     32 y.o. male with complicated history of IV drug abuse, pseudoseizure, catatonic behavior and multiple hospitalizations, ER visits for various complaints to presented with initially abdominal pain at that was epigastric and then became chest pain.  While in the ER he had transient inferior lateral ST elevations leading to code STEMI called.  He was taken to the Cath Lab and found to have normal coronary arteries but inferolateral hypokinesis with troponin elevation up to 6000.  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 => Suspect that the most likely etiology is coronary spasm.  Most likely related to polysubstance abuse. Initiated amlodipine 5 mg and high-dose atorvastatin Pending blood pressure assessment would consider ARB due to reduced EF Was loaded with Plavix yesterday will do Plavix monotherapy (has listed "allergy to aspirin ". 48 hours of enoxaparin coverage-now 6 starting second 24 hours. Will have cardiac rehab work with him No beta-blocker  because of concern for possible cocaine use.  Has a history of positive cocaine        Polysubstance dependence including opioid type drug, episodic abuse (HCC) Recurrent theme with multiple readmissions probably some associated psychological conditions based on chart review.  Would not have been a great candidate for PCI where he could have had an occluded vessel. Minimally responsive - urinated on himself. => Also concern for potential Opiod abuse.  will consult Psych & ask TRH to assist  Will plan to transfer out of ICU today and potentially discharge as tomorrow depending on how he does once Lovenox treatment is complete.   Subjective   Appears to be pain free.  C/o pain with abdominal palpation. Urinated himself last PM.   Does not respond to questions -- minimally verbal.   Lying in bed with eyes fluttering.    Inpatient Medications    Scheduled Meds:  amLODipine  5 mg Oral Daily   atorvastatin  80 mg Oral Daily   Chlorhexidine Gluconate Cloth  6 each Topical Daily   clopidogrel  600 mg Oral Daily   clopidogrel  75 mg Oral Q breakfast   enoxaparin (LOVENOX) injection  80 mg Subcutaneous Q12H   sodium chloride flush  3 mL Intravenous Q12H   Continuous Infusions:  sodium chloride     PRN Meds: sodium chloride, acetaminophen, albuterol, HYDROmorphone (DILAUDID) injection, ondansetron (ZOFRAN) IV, sodium chloride flush   Vital Signs    Vitals:   12/26/22 0500 12/26/22 0505 12/26/22 0510 12/26/22 0600  BP:  (!) 149/104 (!) 151/104   Pulse: 82 84 82   Resp: 19 (!) 24 (!) 22   Temp:      TempSrc:  SpO2: 100% 100% 100%   Weight: 58.9 kg   58.9 kg  Height:        Intake/Output Summary (Last 24 hours) at 12/26/2022 0716 Last data filed at 12/25/2022 1900 Gross per 24 hour  Intake 753.97 ml  Output --  Net 753.97 ml      12/26/2022    6:00 AM 12/26/2022    5:00 AM 12/25/2022    6:16 AM  Last 3 Weights  Weight (lbs) 129 lb 13.6 oz 129 lb 13.6 oz 175 lb  Weight  (kg) 58.9 kg 58.9 kg 79.379 kg      Telemetry    Sinus rhythm- Personally Reviewed  ECG    Sinus rhythm-rate 81 bpm.  There is an appearance of evolutionary changes of inferior MI with subtle ST elevations and T wave inversions but no Q waves.  Right atrial enlargement.  Cannot exclude RVH.- Personally Reviewed  Cardiac Studies   Cardiac catheterization 12/25/2022: Angiographically normal coronary arteries => Suspect Coronary Spasm Mildly reduced EF with apparent mid to apical inferior hypokinesis-poor imaging.  Recommend 2D echo. Normal LVEDP Dominance: Right  TTE 12/25/2022: EF 50 to 55%.  Moderate HK of inferolateral and apical lateral wall.  GR 1 DD.  Hyperdynamic RV.  More valves.  EF mildly reduced with wall motion abnormality A-fib previous echo.  Physical Exam   GEN: No acute distress.  Lying in bed with arms above his head, eyes fluttering. Neck: No JVD, or bruit Cardiac: RRR, no murmurs, rubs, or gallops.  Respiratory: Clear to auscultation bilaterally.  Nonlabored good air movement. GI: Soft, , non-distended ; complains of abdominal pain on palpitation MS: No edema; No deformity. Neuro:  Nonfocal  Psych: Minimally responsive.  Does not verbalize answers.  Has to be aroused and talk to directly for him to respond.  Labs    High Sensitivity Troponin:   Recent Labs  Lab 12/25/22 0348 12/25/22 0613 12/25/22 0932 12/25/22 1131  TROPONINIHS 61* 1,425* 4,204* 5,696*     Chemistry Recent Labs  Lab 12/21/22 2235 12/25/22 0348 12/26/22 0319  NA 139 139 141  K 3.7 3.6 3.9  CL 102 102 103  CO2 28 26 22   GLUCOSE 85 116* 110*  BUN 15 16 22*  CREATININE 1.21 1.18 1.13  CALCIUM 9.6 9.5 9.1  PROT  --  8.3*  --   ALBUMIN  --  4.3  --   AST  --  40  --   ALT  --  43  --   ALKPHOS  --  75  --   BILITOT  --  1.3*  --   GFRNONAA >60 >60 >60  ANIONGAP 9 11 16*    Lipids  Recent Labs  Lab 12/25/22 0708  CHOL 183  TRIG 68  HDL 47  LDLCALC 122*  CHOLHDL 3.9     Hematology Recent Labs  Lab 12/21/22 2235 12/25/22 0348 12/26/22 0319  WBC 10.2 10.5 13.7*  RBC 4.62 5.36 5.54  HGB 13.2 15.3 16.1  HCT 40.3 45.2 46.0  MCV 87.2 84.3 83.0  MCH 28.6 28.5 29.1  MCHC 32.8 33.8 35.0  RDW 13.8 13.2 13.5  PLT 206 223 158   Thyroid No results for input(s): "TSH", "FREET4" in the last 168 hours.  BNPNo results for input(s): "BNP", "PROBNP" in the last 168 hours.  DDimer No results for input(s): "DDIMER" in the last 168 hours.   Radiology     CT Angio Chest/Abd/Pel for Dissection W and/or Wo Contrast  Result Date: 12/25/2022 CLINICAL DATA:  Acute aortic syndrome suspected EXAM: CT ANGIOGRAPHY CHEST, ABDOMEN AND PELVIS TECHNIQUE: Abdominal CT 07/28/2022.  Chest CT 12/15/2021 Multidetector CT imaging through the chest, abdomen and pelvis was performed using the standard protocol during bolus administration of intravenous contrast. Multiplanar reconstructed images and MIPs were obtained and reviewed to evaluate the vascular anatomy. RADIATION DOSE REDUCTION: This exam was performed according to the departmental dose-optimization program which includes automated exposure control, adjustment of the mA and/or kV according to patient size and/or use of iterative reconstruction technique. CONTRAST:  OMNIPAQUE IOHEXOL 350 MG/ML SOLN COMPARISON:  None Available. FINDINGS: CTA CHEST FINDINGS Cardiovascular: Preferential opacification of the thoracic aorta. No evidence of thoracic aortic aneurysm or dissection. Normal heart size. No pericardial effusion. Mediastinum/Nodes: No hematoma or adenopathy Lungs/Pleura: There is no edema, consolidation, effusion, or pneumothorax. Musculoskeletal: No acute finding or cause for chest pain Review of the MIP images confirms the above findings. CTA ABDOMEN AND PELVIS FINDINGS VASCULAR Aorta: Negative for dissection or aneurysm.  No stenosis. Celiac: Large branches with some tortuosity accentuated for age. No stenosis, aneurysm, or  beading. SMA: Replaced right hepatic artery. Prominent size arteries for age with some tortuosity. No stenosis, beading, or aneurysm Renals: Smoothly contoured and widely patent. Early branching right renal artery. IMA: Smoothly contoured and patent Inflow: Unremarkable Veins: Unremarkable in the arterial phase Review of the MIP images confirms the above findings. NON-VASCULAR Hepatobiliary: No focal liver abnormality.No evidence of biliary obstruction or stone. Pancreas: Unremarkable. Spleen: Unremarkable. Adrenals/Urinary Tract: Negative adrenals. No hydronephrosis or stone. Unremarkable bladder. Stomach/Bowel:  No obstruction. No appendicitis. Vascular/Lymphatic: No acute vascular abnormality. No mass or adenopathy. Reproductive:No pathologic findings. Other: No ascites or pneumoperitoneum. Musculoskeletal: No acute abnormalities. Review of the MIP images confirms the above findings. IMPRESSION: Negative for acute aortic syndrome.  No specific cause for symptoms. Electronically Signed   By: Tiburcio Pea M.D.   On: 12/25/2022 06:47   CT Head Wo Contrast  Result Date: 12/25/2022 CLINICAL DATA:  Mental status change with unknown cause EXAM: CT HEAD WITHOUT CONTRAST TECHNIQUE: Contiguous axial images were obtained from the base of the skull through the vertex without intravenous contrast. RADIATION DOSE REDUCTION: This exam was performed according to the departmental dose-optimization program which includes automated exposure control, adjustment of the mA and/or kV according to patient size and/or use of iterative reconstruction technique. COMPARISON:  Four days ago FINDINGS: Brain: No evidence of acute infarction, hemorrhage, hydrocephalus, extra-axial collection or mass lesion/mass effect. Vascular: No hyperdense vessel or unexpected calcification. Skull: Normal. Negative for fracture or focal lesion. Sinuses/Orbits: No acute finding. Other: Band of streak artifact across the ventral brain. IMPRESSION:  Negative motion degraded head CT. Electronically Signed   By: Tiburcio Pea M.D.   On: 12/25/2022 04:10   DG Chest 1 View  Result Date: 12/25/2022 CLINICAL DATA:  Hiccups and chest pain EXAM: CHEST  1 VIEW COMPARISON:  08/05/2022 FINDINGS: The heart size and mediastinal contours are within normal limits. Both lungs are clear. The visualized skeletal structures are unremarkable. IMPRESSION: No active disease. Electronically Signed   By: Deatra Robinson M.D.   On: 12/25/2022 03:41       For questions or updates, please contact [facility] Please consult [email or web address] for contact info under        Signed, Bryan Lemma, MD  12/26/2022, 7:16 AM

## 2022-12-26 NOTE — Progress Notes (Signed)
EEG complete - results pending 

## 2022-12-26 NOTE — Assessment & Plan Note (Signed)
-  A1c is 6.4, at the upper limit of pre-diabetes -Will request nutrition consult, as he is likely to develop DM soon if he does not alter diet and exercise patterns

## 2022-12-26 NOTE — Assessment & Plan Note (Signed)
-  Prior symptoms during 01/2022 admission -He is not currently catatonic and is able to answer questions appropriately - albeit eccentrically -Psych consult is pending -Medicine has little to offer in this circumstance and will defer to psych -As such, we will sign off at this time; please re-consult should additional medical issues arise

## 2022-12-26 NOTE — Progress Notes (Addendum)
Patient laying in bed comfortable with eyes closed.  When RN asked patient if he could sit up to take his medicine, he did not answer.  RN asked two more times, educated importance of medication and patient would not open eyes or speak to RN.  Patient does open eyes at times and speak but unwilling to take medication when presented. Will try again.

## 2022-12-26 NOTE — Progress Notes (Signed)
CARDIAC REHAB PHASE I    Stopped by to educate and ambulate. However, pt continues to express semi-catatonic behavior and verbal response varies frequently. RN reports Psychiatric evaluation pending. Will return later today or tomorrow for education and ambulation. Will continue to follow.   [id number]  Woodroe Chen, RN BSN 12/26/2022 11:42 AM

## 2022-12-26 NOTE — Consult Note (Signed)
Initial Consultation Note   Patient: Stephen Shepard BJY:[id number] DOB: 04/26/1991 PCP: Patient, [name] DOA: 12/25/2022 DOS: the patient was seen and examined on 12/26/2022 Primary service: Marykay Lex, MD  Referring physician: Herbie Baltimore Reason for consult: Psych to see.  H/o catatonia with BZD withdrawal, currently semi-catatonic.  Came in with abdominal pain and nausea -> chest pain -> abnormal EKG -> cath lab.  Clear coronaries, probably RCA spasm and being treated.  Low EF.  ?withdrawal.    Assessment and Plan: * Acute coronary syndrome with high troponin (HCC) -Negative cath -Management per cardiology  Psychiatric illness -Prior symptoms during 01/2022 admission -He is not currently catatonic and is able to answer questions appropriately - albeit eccentrically -Psych consult is pending -Medicine has little to offer in this circumstance and will defer to psych -As such, we will sign off at this time; please re-consult should additional medical issues arise  Polysubstance dependence including opioid type drug, episodic abuse (HCC) -Cessation encouraged  Coronary artery disease involving native coronary artery of native heart with angina pectoris with documented spasm (HCC) -negative cath, management per cardiology  Prediabetes -A1c is 6.4, at the upper limit of pre-diabetes -Will request nutrition consult, as he is likely to develop DM soon if he does not alter diet and exercise patterns     TRH will sign off at present, please call us again when needed.  HPI: Stephen Shepard is a 32 y.o. male with past medical history of polysubstance abuse presenting with chest pain.  His last hospitalization was from 12/29-1/3 for intractable n/v with polysubstance abuse and possible cannabinoid hyperemesis.  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  During that hospitalization he developed  catatonia and was treated with Ativan, which led to hyperactive psychosis.  He was deemed to not require psychiatric hospitalization at the time of discharge.    At the time of my evaluation, the patient was eccentric but certainly not catatonic.  He answered questions appropriately, although sometimes with subtle nods and other times verbally.  He denied pain.  He did appear to be concerned about "gas" and asked me to "take a look" but there was no stool noted and I explained to him about the PureWick and offered the opportunity to get up to the bathroom, which he declined.   Review of Systems: As mentioned in the history of present illness. All other systems reviewed and are negative. Past Medical History:  Diagnosis Date   Anxiety    Asthma    Fever and positive blood culture 01/31/2022   Seizure-like activity (HCC) 01/30/2022   Seizures (HCC)    Substance abuse (HCC)    Past Surgical History:  Procedure Laterality Date   I & D EXTREMITY Right 12/15/2021   Procedure: IRRIGATION AND DEBRIDEMENT RIGHT HAND AND SHOULDER;  Surgeon: Bradly Bienenstock, MD;  Location: [facility];  Service: Orthopedics;  Laterality: Right;  upper and lower arm   LEFT HEART CATH AND CORONARY ANGIOGRAPHY N/A 12/25/2022   Procedure: LEFT HEART CATH AND CORONARY ANGIOGRAPHY;  Surgeon: Marykay Lex, MD;  Location: [facility] INVASIVE CV LAB;  Service: Cardiovascular;  Laterality: N/A;   TEE WITHOUT CARDIOVERSION N/A 12/21/2021   Procedure: TRANSESOPHAGEAL ECHOCARDIOGRAM (TEE);  Surgeon: Little Ishikawa, MD;  Location: [facility] ENDOSCOPY;  Service: Cardiovascular;  Laterality: N/A;   Social History:  reports that he has been smoking cigarettes. He has a 17.00 pack-year smoking history. He  has been exposed to tobacco smoke. He has never used smokeless tobacco. He reports current drug use. Drug: IV. He reports that he does not drink alcohol.  Allergies  Allergen Reactions   Keppra [Levetiracetam] Anaphylaxis   Motrin [Ibuprofen]  Anaphylaxis   Nitrostat [Nitroglycerin] Shortness Of Breath, Palpitations and Other (See Comments)    Severe chest pain    Wasp Venom Protein Hives and Swelling    History reviewed. No pertinent family history.  Prior to Admission medications   Medication Sig Start Date End Date Taking? Authorizing Provider  acetaminophen (TYLENOL) 500 MG tablet Take 500 mg by mouth every 6 (six) hours as needed for mild pain. 04/17/14  Yes [provider]  albuterol (VENTOLIN HFA) 108 (90 Base) MCG/ACT inhaler Inhale 1-2 puffs into the lungs every 6 (six) hours as needed for wheezing or shortness of breath. Patient not taking: Reported on 12/25/2022 08/05/22   Linwood Dibbles, MD  benzonatate (TESSALON) 100 MG capsule Take 1 capsule (100 mg total) by mouth every 8 (eight) hours. Patient not taking: Reported on 12/21/2022 08/05/22   Linwood Dibbles, MD  Guaifenesin 1200 MG TB12 Take 1 tablet (1,200 mg total) by mouth 2 (two) times daily at 10 AM and 5 PM. Patient not taking: Reported on 12/21/2022 08/05/22   Linwood Dibbles, MD    Physical Exam: Vitals:   12/26/22 0900 12/26/22 0924 12/26/22 1000 12/26/22 1100  BP: (!) 135/104 (!) 135/104 (!) 141/104 (!) 144/111  Pulse: 99  98 93  Resp: (!) 29  (!) 21 (!) 29  Temp:      TempSrc:      SpO2: 100%  100% 100%  Weight:      Height:       General:  Appears calm and comfortable and is in NAD, eccentric Eyes:   EOMI, normal lids, iris; rapidly flutters lids periodically ENT:  grossly normal hearing, lips & tongue, mmm Neck:  no LAD, masses or thyromegaly Cardiovascular:  RRR, no m/r/g. No LE edema.  Respiratory:   CTA bilaterally with no wheezes/rales/rhonchi.  Normal to mildly increased respiratory effort. Abdomen:  soft, NT, ND Skin:  no rash or induration seen on limited exam Musculoskeletal:  grossly normal tone BUE/BLE, good ROM, no bony abnormality Psychiatric:  eccentric mood and affect, speech sparse but appropriate Neurologic:  CN 2-12 grossly intact,  moves all extremities in coordinated fashion   Radiological Exams on Admission: Independently reviewed - see discussion in A/P where applicable  ECHOCARDIOGRAM COMPLETE  Result Date: 12/25/2022    ECHOCARDIOGRAM REPORT   Patient Name:   DALIAN SMID Date of Exam: 12/25/2022 Medical Rec #:  [id number]    Height:       76.0 in Accession #:    [id number]   Weight:       175.0 lb Date of Birth:  08-28-1990    BSA:          2.093 m Patient Age:    31 years     BP:           123/82 mmHg Patient Gender: M            HR:           75 bpm. Exam Location:  Inpatient Procedure: 2D Echo, 3D Echo, Cardiac Doppler, Color Doppler and Strain Analysis Indications:    122-I22.9 Subsequent ST elevation (STEM) and non-ST elevation                 (NSTEMI) myocardial infarction  History:        Patient has prior history of Echocardiogram examinations, most                 recent 12/19/2021. Acute MI, Signs/Symptoms:Fever, Bacteremia,                 Altered Mental Status, Shortness of Breath, Dyspnea and Chest                 Pain; Risk Factors:Current Smoker. IVDU. Withdrawal.  Sonographer:    Sheralyn Boatman RDCS Referring Phys: 86 DAVID W [facility]  Sonographer Comments: Suboptimal parasternal window. Patient could not follow directions. IMPRESSIONS  1. Left ventricular ejection fraction, by estimation, is 50 to 55%. The left ventricle has low normal function. The left ventricle demonstrates regional wall motion abnormalities (see scoring diagram/findings for description). Left ventricular diastolic  parameters are consistent with Grade I diastolic dysfunction (impaired relaxation). There is moderate hypokinesis of the left ventricular, apical apical segment and inferolateral wall.  2. Right ventricular systolic function is hyperdynamic. The right ventricular size is normal.  3. The mitral valve is grossly normal. Trivial mitral valve regurgitation.  4. The aortic valve is tricuspid. Aortic valve regurgitation is not visualized.  5.  The pulmonic valve was abnormal.  6. The inferior vena cava is normal in size with greater than 50% respiratory variability, suggesting right atrial pressure of 3 mmHg. Comparison(s): Changes from prior study are noted. 12/19/2021: LVEF 55-60%. Conclusion(s)/Recommendation(s): No evidence of valvular vegetations on this transthoracic echocardiogram. Consider a transesophageal echocardiogram to exclude infective endocarditis if clinically indicated. FINDINGS  Left Ventricle: Left ventricular ejection fraction, by estimation, is 50 to 55%. The left ventricle has low normal function. The left ventricle demonstrates regional wall motion abnormalities. Moderate hypokinesis of the left ventricular, apical apical segment and inferolateral wall. The left ventricular internal cavity size was normal in size. There is no left ventricular hypertrophy. Left ventricular diastolic parameters are consistent with Grade I diastolic dysfunction (impaired relaxation). Indeterminate filling pressures.  LV Wall Scoring: The apical lateral segment, apical inferior segment, and apex are normal. Right Ventricle: The right ventricular size is normal. No increase in right ventricular wall thickness. Right ventricular systolic function is hyperdynamic. Left Atrium: Left atrial size was normal in size. Right Atrium: Right atrial size was normal in size. Pericardium: There is no evidence of pericardial effusion. Mitral Valve: The mitral valve is grossly normal. Trivial mitral valve regurgitation. Tricuspid Valve: The tricuspid valve is grossly normal. Tricuspid valve regurgitation is trivial. Aortic Valve: The aortic valve is tricuspid. Aortic valve regurgitation is not visualized. Pulmonic Valve: The pulmonic valve was abnormal. Pulmonic valve regurgitation is mild. Aorta: The aortic root and ascending aorta are structurally normal, with no evidence of dilitation. Venous: The inferior vena cava is normal in size with greater than 50% respiratory  variability, suggesting right atrial pressure of 3 mmHg. IAS/Shunts: There is right bowing of the interatrial septum, suggestive of elevated left atrial pressure. No atrial level shunt detected by color flow Doppler.  LEFT VENTRICLE PLAX 2D LVIDd:         5.00 cm   Diastology LVIDs:         3.50 cm   LV e' medial:    7.62 cm/s LV PW:         0.90 cm   LV E/e' medial:  7.9 LV IVS:        1.00 cm   LV e' lateral:   6.96 cm/s LVOT diam:  2.30 cm   LV E/e' lateral: 8.6 LV SV:         86 LV SV Index:   41 LVOT Area:     4.15 cm                           3D Volume EF:                          3D EF:        53 %                          LV EDV:       177 ml                          LV ESV:       82 ml                          LV SV:        95 ml RIGHT VENTRICLE             IVC RV S prime:     22.60 cm/s  IVC diam: 1.60 cm TAPSE (M-mode): 2.8 cm LEFT ATRIUM             Index        RIGHT ATRIUM           Index LA diam:        3.10 cm 1.48 cm/m   RA Area:     13.10 cm LA Vol (A2C):   47.7 ml 22.79 ml/m  RA Volume:   31.50 ml  15.05 ml/m LA Vol (A4C):   39.3 ml 18.78 ml/m LA Biplane Vol: 47.9 ml 22.89 ml/m  AORTIC VALVE             PULMONIC VALVE LVOT Vmax:   114.00 cm/s PR End Diast Vel: 1.71 msec LVOT Vmean:  74.500 cm/s LVOT VTI:    0.206 m  AORTA Ao Root diam: 3.30 cm MITRAL VALVE MV Area (PHT): 4.31 cm    SHUNTS MV Decel Time: 176 msec    Systemic VTI:  0.21 m MV E velocity: 60.10 cm/s  Systemic Diam: 2.30 cm MV A velocity: 63.00 cm/s MV E/A ratio:  0.95 Zoila Shutter MD Electronically signed by Zoila Shutter MD Signature Date/Time: 12/25/2022/5:05:00 PM    Final    CARDIAC CATHETERIZATION  Result Date: 12/25/2022 Angiographically normal coronary arteries => Suspect Coronary Spasm Mildly reduced EF with apparent mid to apical inferior hypokinesis-poor imaging.  Recommend 2D echo. Normal LVEDP PLAN: Admit to CVICU initially with plan to transfer out in the a.m. Anticoagulate with treatment dose Lovenox for  48 hours Cycle least 2 more troponin levels Check 2D echo to evaluate wall motion-poor imaging on LV gram. Given positive troponins will load with 600 mg clopidogrel today and 75 mg daily beginning tomorrow. Start amlodipine 5 mg daily, and atorvastatin 80 mg No beta-blocker due to concern for PSA and the potential for cocaine/crack Bryan Lemma, MD   CT Angio Chest/Abd/Pel for Dissection W and/or Wo Contrast  Result Date: 12/25/2022 CLINICAL DATA:  Acute aortic syndrome suspected EXAM: CT ANGIOGRAPHY CHEST, ABDOMEN AND PELVIS TECHNIQUE: Abdominal CT 07/28/2022.  Chest CT 12/15/2021 Multidetector CT imaging through the chest, abdomen and pelvis was performed  using the standard protocol during bolus administration of intravenous contrast. Multiplanar reconstructed images and MIPs were obtained and reviewed to evaluate the vascular anatomy. RADIATION DOSE REDUCTION: This exam was performed according to the departmental dose-optimization program which includes automated exposure control, adjustment of the mA and/or kV according to patient size and/or use of iterative reconstruction technique. CONTRAST:  OMNIPAQUE IOHEXOL 350 MG/ML SOLN COMPARISON:  None Available. FINDINGS: CTA CHEST FINDINGS Cardiovascular: Preferential opacification of the thoracic aorta. No evidence of thoracic aortic aneurysm or dissection. Normal heart size. No pericardial effusion. Mediastinum/Nodes: No hematoma or adenopathy Lungs/Pleura: There is no edema, consolidation, effusion, or pneumothorax. Musculoskeletal: No acute finding or cause for chest pain Review of the MIP images confirms the above findings. CTA ABDOMEN AND PELVIS FINDINGS VASCULAR Aorta: Negative for dissection or aneurysm.  No stenosis. Celiac: Large branches with some tortuosity accentuated for age. No stenosis, aneurysm, or beading. SMA: Replaced right hepatic artery. Prominent size arteries for age with some tortuosity. No stenosis, beading, or aneurysm Renals:  Smoothly contoured and widely patent. Early branching right renal artery. IMA: Smoothly contoured and patent Inflow: Unremarkable Veins: Unremarkable in the arterial phase Review of the MIP images confirms the above findings. NON-VASCULAR Hepatobiliary: No focal liver abnormality.No evidence of biliary obstruction or stone. Pancreas: Unremarkable. Spleen: Unremarkable. Adrenals/Urinary Tract: Negative adrenals. No hydronephrosis or stone. Unremarkable bladder. Stomach/Bowel:  No obstruction. No appendicitis. Vascular/Lymphatic: No acute vascular abnormality. No mass or adenopathy. Reproductive:No pathologic findings. Other: No ascites or pneumoperitoneum. Musculoskeletal: No acute abnormalities. Review of the MIP images confirms the above findings. IMPRESSION: Negative for acute aortic syndrome.  No specific cause for symptoms. Electronically Signed   By: Tiburcio Pea M.D.   On: 12/25/2022 06:47   CT Head Wo Contrast  Result Date: 12/25/2022 CLINICAL DATA:  Mental status change with unknown cause EXAM: CT HEAD WITHOUT CONTRAST TECHNIQUE: Contiguous axial images were obtained from the base of the skull through the vertex without intravenous contrast. RADIATION DOSE REDUCTION: This exam was performed according to the departmental dose-optimization program which includes automated exposure control, adjustment of the mA and/or kV according to patient size and/or use of iterative reconstruction technique. COMPARISON:  Four days ago FINDINGS: Brain: No evidence of acute infarction, hemorrhage, hydrocephalus, extra-axial collection or mass lesion/mass effect. Vascular: No hyperdense vessel or unexpected calcification. Skull: Normal. Negative for fracture or focal lesion. Sinuses/Orbits: No acute finding. Other: Band of streak artifact across the ventral brain. IMPRESSION: Negative motion degraded head CT. Electronically Signed   By: Tiburcio Pea M.D.   On: 12/25/2022 04:10   DG Chest 1 View  Result Date:  12/25/2022 CLINICAL DATA:  Hiccups and chest pain EXAM: CHEST  1 VIEW COMPARISON:  08/05/2022 FINDINGS: The heart size and mediastinal contours are within normal limits. Both lungs are clear. The visualized skeletal structures are unremarkable. IMPRESSION: No active disease. Electronically Signed   By: Deatra Robinson M.D.   On: 12/25/2022 03:41    EKG: Independently reviewed.  NSR with rate 81; prolonged QTc 513; nonspecific ST changes with no evidence of acute ischemia   Labs on Admission: I have personally reviewed the available labs and imaging studies at the time of the admission.  Pertinent labs:    Glucose 110 Anion gap 16 HS troponin 4204, 5696 WBC 13.7 A1c 6.4   Family Communication: None present Primary team communication: Patient was discussed with Dr. Herbie Baltimore at the time of the consult.  Thank you very much for involving [place] in the care  of your patient.  Since he has not active medical problems other than psychiatric illness and cardiac issues, TRH will sign off at this time.  Author: Jonah Blue, MD 12/26/2022 11:52 AM  For on call review [email or web address].

## 2022-12-26 NOTE — Assessment & Plan Note (Signed)
Cessation encouraged 

## 2022-12-26 NOTE — Assessment & Plan Note (Signed)
-  Negative cath -Management per cardiology
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
## 2022-12-26 NOTE — Progress Notes (Signed)
Transferred -in from 2H by bed awake but non responsive verbally. Sitter at bedside. Would  just stare when talked to.continue to monitor.

## 2022-12-26 NOTE — Assessment & Plan Note (Signed)
-  negative cath, management per cardiology

## 2022-12-27 DIAGNOSIS — I249 Acute ischemic heart disease, unspecified: Secondary | ICD-10-CM | POA: Diagnosis not present

## 2022-12-27 DIAGNOSIS — F1120 Opioid dependence, uncomplicated: Secondary | ICD-10-CM | POA: Diagnosis not present

## 2022-12-27 DIAGNOSIS — I25111 Atherosclerotic heart disease of native coronary artery with angina pectoris with documented spasm: Secondary | ICD-10-CM | POA: Diagnosis not present

## 2022-12-27 DIAGNOSIS — R9431 Abnormal electrocardiogram [ECG] [EKG]: Secondary | ICD-10-CM | POA: Diagnosis not present

## 2022-12-27 DIAGNOSIS — R569 Unspecified convulsions: Secondary | ICD-10-CM

## 2022-12-27 DIAGNOSIS — R4182 Altered mental status, unspecified: Secondary | ICD-10-CM | POA: Diagnosis not present

## 2022-12-27 DIAGNOSIS — F29 Unspecified psychosis not due to a substance or known physiological condition: Secondary | ICD-10-CM

## 2022-12-27 LAB — RPR: RPR Ser Ql: NONREACTIVE

## 2022-12-27 LAB — MAGNESIUM: Magnesium: 2.1 mg/dL (ref 1.7–2.4)

## 2022-12-27 LAB — VITAMIN B12: Vitamin B-12: 629 pg/mL (ref 180–914)

## 2022-12-27 LAB — PROCALCITONIN: Procalcitonin: <0.1 ng/mL

## 2022-12-27 MED ORDER — ATENOLOL 25 MG PO TABS
25.0000 mg | ORAL_TABLET | Freq: Every day | ORAL | Status: DC
  Administered 2022-12-27 – 2022-12-29 (×3): 25 mg via ORAL
  Filled 2022-12-27 (×3): qty 1

## 2022-12-27 MED ORDER — GUANFACINE HCL 1 MG PO TABS
1.0000 mg | ORAL_TABLET | Freq: Every day | ORAL | Status: DC
  Administered 2022-12-27: 1 mg via ORAL
  Filled 2022-12-27 (×2): qty 1

## 2022-12-27 MED ORDER — OLANZAPINE 10 MG IM SOLR
5.0000 mg | Freq: Once | INTRAMUSCULAR | Status: AC
  Administered 2022-12-27: 5 mg via INTRAMUSCULAR
  Filled 2022-12-27: qty 10

## 2022-12-27 MED ORDER — LORAZEPAM 2 MG/ML IJ SOLN
2.0000 mg | Freq: Once | INTRAMUSCULAR | Status: AC
  Administered 2022-12-27: 2 mg via INTRAVENOUS
  Filled 2022-12-27: qty 1
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
## 2022-12-27 NOTE — Progress Notes (Signed)
Rounding Note    Patient Name: Stephen Shepard Date of Encounter: 12/27/2022  [facility] Cardiologist: None new  Patient Profile     32 y.o. male with complicated history of IV drug abuse, pseudoseizure, catatonic behavior and multiple hospitalizations, ER visits for various complaints to presented with initially abdominal pain at that was epigastric and then became chest pain.  While in the ER he had transient inferior lateral ST elevations leading to code STEMI called.  He was taken to the Cath Lab and found to have normal coronary arteries but inferolateral hypokinesis with troponin elevation up to 6000.   Subjective   Became somewhat psychotic yesterday after being almost catatonic.  Psychiatry consult has seen the patient appreciate their input.  Medicine signed off as opposed to taking over to their service.  Patient was transferred to [facility].  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  Today he is wide-awake, talking although almost nonsensical.  Intermittently has episodes where he understands why he is here and then speaks about other things that have nothing to do with the current situation. Seems to be somewhat hallucinating with hearing things and hearing other people speaking to him about things.  Flight of ideas and random thoughts.  When I saw him today he seemed to be comfortable no chest pain, but he remembers coming to the emergency room with pain and throwing up in the CT scan.  Does not remember having heart catheterization although no sedation was given.  On my evaluation he was in sinus tachycardia going 160 beats a minute and then slowed down to the 100 range.  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 => Suspect that the most likely etiology is coronary spasm.  Most likely related to polysubstance abuse. Initiated amlodipine 5 mg and high-dose atorvastatin ARB added yesterday because of reduced EF, however with the recommendation for using a beta-blocker for his tachycardia, we will add atenolol today and hold losartan until discharge.   When he is discharged, we would want to switch him back to losartan and probably not use a beta-blocker given concern for possible cocaine abuse. Was loaded with Plavix yesterday will do Plavix monotherapy (has listed "allergy to aspirin ". 48 hours of enoxaparin coverage-last dose will be this afternoon -> after that, would probably recommend prophylactic dosing if he is getting green in the bed. I do not think that he is a candidate for cardiac rehab because of his psychiatry issues. We had not use beta-blocker before because of his concern of possible cocaine.  Now it appears that the spasm is likely caused by xylazine which has alpha agonist properties.  Polysubstance dependence including opioid type drug, episodic abuse (HCC) Now appears to be psychotic, probably in withdrawal.  Psychiatry team feels like he has been abusing "Tranq-dope" (xylazine, fentanyl and benzo combination), this  medication is very lipophilic and likely contributing to the cardiac symptoms we are seeing.  -- Suggested using using clonidine or guanfacine as well as beta-blocker such as atenolol with better tendency to cross the blood-brain barrier.. IVC orders being placed by psychiatry.  Would likely need inpatient psychiatry once bed is available.  For now we will monitor him in the stepdown unit.  Management of withdrawal effect and psychotic reactions for psychiatry.  he does have somewhat elongated QTc which is probably related to his postinfarct EKG changes.  Concern for using antipsychotics in the setting.  Zyprexa being the less likely to cause QT prolongation is a viable option since he clearly needs an antipsychotic agent.   Anticipate transfer to inpatient psychiatry hopefully either later on today or tomorrow.   Inpatient Medications    Scheduled Meds: Indicates changes made in the note above.  amLODipine  10 mg Oral Daily   atenolol  25 mg Oral Daily   atorvastatin  80 mg Oral Daily   Chlorhexidine Gluconate Cloth  6 each Topical Daily   clopidogrel  75 mg Oral Q breakfast   enoxaparin (LOVENOX) injection  80 mg Subcutaneous Q12H   feeding supplement  237 mL Oral TID BM   LORazepam  2 mg Oral BID   OLANZapine zydis  5 mg Oral BID   sodium chloride flush  3 mL Intravenous Q12H   Continuous Infusions:  sodium chloride     thiamine (VITAMIN B1) injection 250 mg (12/27/22 0027)   PRN Meds: sodium chloride, acetaminophen, albuterol, HYDROmorphone (DILAUDID) injection, ondansetron (ZOFRAN) IV, sodium chloride flush   Vital Signs    Vitals:   12/26/22 1800 12/26/22 1906 12/27/22 0000 12/27/22 0304  BP: (!) 142/105 (!) 151/104 (!) 135/106 (!) 142/107  Pulse: (!) 117   (!) 111  Resp: 20 (!) 32 18 20  Temp:  98.8 F (37.1 C)  98.5 F (36.9 C)  TempSrc:  Oral  Oral  SpO2: 100%   97%  Weight:      Height:        Intake/Output Summary (Last 24 hours) at 12/27/2022 0957 Last data  filed at 12/27/2022 0900 Gross per 24 hour  Intake 960 ml  Output 1300 ml  Net -340 ml      12/26/2022    6:00 AM 12/26/2022    5:00 AM 12/25/2022    6:16 AM  Last 3 Weights  Weight (lbs) 129 lb 13.6 oz 129 lb 13.6 oz 175 lb  Weight (kg) 58.9 kg 58.9 kg 79.379 kg      Telemetry    Sinus rhythm- Personally Reviewed  ECG    Sinus rhythm-rate 81 bpm.  There is an appearance of evolutionary changes of inferior MI with subtle ST elevations and T wave inversions but no Q waves.  Right atrial enlargement.  Cannot exclude RVH.- Personally Reviewed  Cardiac Studies   Cardiac catheterization 12/25/2022: Angiographically normal coronary arteries => Suspect Coronary Spasm Mildly reduced EF with apparent mid to apical inferior hypokinesis-poor imaging.  Recommend 2D echo. Normal LVEDP Dominance: Right  TTE 12/25/2022: EF 50 to 55%.  Moderate HK of inferolateral and apical lateral wall.  GR 1 DD.  Hyperdynamic RV.  More valves.  EF mildly reduced with wall motion abnormality A-fib previous echo.  Physical Exam  GEN: No acute distress.  Lying in bed with arms above his head, eyes fluttering. Neck: No JVD, or bruit Cardiac: RRR, no murmurs, rubs, or gallops.  Respiratory: Clear to auscultation bilaterally.  Nonlabored good air movement. GI: Soft, , non-distended ; complains of abdominal pain on palpitation MS: No edema; No deformity. Neuro:  Nonfocal  Psych: Minimally responsive.  Does not verbalize answers.  Has to be aroused and talk to directly for him to respond.  Labs    High Sensitivity Troponin:   Recent Labs  Lab 12/25/22 0348 12/25/22 0613 12/25/22 0932 12/25/22 1131  TROPONINIHS 61* 1,425* 4,204* 5,696*     Chemistry Recent Labs  Lab 12/21/22 2235 12/25/22 0348 12/26/22 0319 12/26/22 1253  NA 139 139 141  --   K 3.7 3.6 3.9  --   CL 102 102 103  --   CO2 28 26 22   --   GLUCOSE 85 116* 110*  --   BUN 15 16 22*  --   CREATININE 1.21 1.18 1.13  --   CALCIUM 9.6  9.5 9.1  --   MG  --   --   --  2.1  PROT  --  8.3*  --   --   ALBUMIN  --  4.3  --   --   AST  --  40  --   --   ALT  --  43  --   --   ALKPHOS  --  75  --   --   BILITOT  --  1.3*  --   --   GFRNONAA >60 >60 >60  --   ANIONGAP 9 11 16*  --     Lipids  Recent Labs  Lab 12/25/22 0708  CHOL 183  TRIG 68  HDL 47  LDLCALC 122*  CHOLHDL 3.9    Hematology Recent Labs  Lab 12/21/22 2235 12/25/22 0348 12/26/22 0319  WBC 10.2 10.5 13.7*  RBC 4.62 5.36 5.54  HGB 13.2 15.3 16.1  HCT 40.3 45.2 46.0  MCV 87.2 84.3 83.0  MCH 28.6 28.5 29.1  MCHC 32.8 33.8 35.0  RDW 13.8 13.2 13.5  PLT 206 223 158   Thyroid No results for input(s): "TSH", "FREET4" in the last 168 hours.  BNPNo results for input(s): "BNP", "PROBNP" in the last 168 hours.  DDimer No results for input(s): "DDIMER" in the last 168 hours.   Radiology        CT Angio Chest/Abd/Pel for Dissection W and/or Wo Contrast  Result Date: 12/25/2022 CLINICAL DATA:  Acute aortic syndrome suspected EXAM: CT ANGIOGRAPHY CHEST, ABDOMEN AND PELVIS TECHNIQUE: Abdominal CT 07/28/2022.  Chest CT 12/15/2021 Multidetector CT imaging through the chest, abdomen and pelvis was performed using the standard protocol during bolus administration of intravenous contrast. Multiplanar reconstructed images and MIPs were obtained and reviewed to evaluate the vascular anatomy. RADIATION DOSE REDUCTION: This exam was performed according to the departmental dose-optimization program which includes automated exposure control, adjustment of the mA and/or kV according to patient size and/or use of iterative reconstruction technique. CONTRAST:  OMNIPAQUE IOHEXOL 350 MG/ML SOLN COMPARISON:  None Available. FINDINGS: CTA CHEST FINDINGS Cardiovascular: Preferential opacification of the thoracic aorta. No evidence of thoracic aortic aneurysm or dissection. Normal heart size. No pericardial effusion. Mediastinum/Nodes: No hematoma or adenopathy  Lungs/Pleura: There is no edema, consolidation, effusion, or pneumothorax. Musculoskeletal: No acute finding or cause for chest pain Review of the MIP images confirms the above findings. CTA ABDOMEN AND PELVIS  FINDINGS VASCULAR Aorta: Negative for dissection or aneurysm.  No stenosis. Celiac: Large branches with some tortuosity accentuated for age. No stenosis, aneurysm, or beading. SMA: Replaced right hepatic artery. Prominent size arteries for age with some tortuosity. No stenosis, beading, or aneurysm Renals: Smoothly contoured and widely patent. Early branching right renal artery. IMA: Smoothly contoured and patent Inflow: Unremarkable Veins: Unremarkable in the arterial phase Review of the MIP images confirms the above findings. NON-VASCULAR Hepatobiliary: No focal liver abnormality.No evidence of biliary obstruction or stone. Pancreas: Unremarkable. Spleen: Unremarkable. Adrenals/Urinary Tract: Negative adrenals. No hydronephrosis or stone. Unremarkable bladder. Stomach/Bowel:  No obstruction. No appendicitis. Vascular/Lymphatic: No acute vascular abnormality. No mass or adenopathy. Reproductive:No pathologic findings. Other: No ascites or pneumoperitoneum. Musculoskeletal: No acute abnormalities. Review of the MIP images confirms the above findings. IMPRESSION: Negative for acute aortic syndrome.  No specific cause for symptoms. Electronically Signed   By: Tiburcio Pea M.D.   On: 12/25/2022 06:47   CT Head Wo Contrast  Result Date: 12/25/2022 CLINICAL DATA:  Mental status change with unknown cause EXAM: CT HEAD WITHOUT CONTRAST TECHNIQUE: Contiguous axial images were obtained from the base of the skull through the vertex without intravenous contrast. RADIATION DOSE REDUCTION: This exam was performed according to the departmental dose-optimization program which includes automated exposure control, adjustment of the mA and/or kV according to patient size and/or use of iterative reconstruction technique.  COMPARISON:  Four days ago FINDINGS: Brain: No evidence of acute infarction, hemorrhage, hydrocephalus, extra-axial collection or mass lesion/mass effect. Vascular: No hyperdense vessel or unexpected calcification. Skull: Normal. Negative for fracture or focal lesion. Sinuses/Orbits: No acute finding. Other: Band of streak artifact across the ventral brain. IMPRESSION: Negative motion degraded head CT. Electronically Signed   By: Tiburcio Pea M.D.   On: 12/25/2022 04:10   DG Chest 1 View  Result Date: 12/25/2022 CLINICAL DATA:  Hiccups and chest pain EXAM: CHEST  1 VIEW COMPARISON:  08/05/2022 FINDINGS: The heart size and mediastinal contours are within normal limits. Both lungs are clear. The visualized skeletal structures are unremarkable. IMPRESSION: No active disease. Electronically Signed   By: Deatra Robinson M.D.   On: 12/25/2022 03:41       For questions or updates, please contact [facility] Please consult [email or web address] for contact info under        Signed, Bryan Lemma, MD  12/27/2022, 9:57 AM

## 2022-12-27 NOTE — Procedures (Signed)
Patient Name: Stephen Shepard  MRN: [id number]  Epilepsy Attending: Charlsie Quest  Referring Physician/Provider: Lynnae January, NP  Date: 12/27/2022 Duration: 25.38 mins  Patient history:  32 y.o. male with PMH significant for polysubstance abuse, catatonic behavior, pseudoseizures, multiple hospitalizations. Admitted 5/27 after loss of consciousness, falling at work and hitting his head, bystanders stated they saw some shaking after. EEG to evaluate for seizure.  Level of alertness: Awake  AEDs during EEG study: Ativan  Technical aspects: This EEG study was done with scalp electrodes positioned according to the 10-20 International system of electrode placement. Electrical activity was reviewed with band pass filter of 1-70Hz , sensitivity of 7 uV/mm, display speed of 61mm/sec with a 60Hz  notched filter applied as appropriate. EEG data were recorded continuously and digitally stored.  Video monitoring was available and reviewed as appropriate.  Description: The posterior dominant rhythm consists of 8 Hz activity of moderate voltage (25-35 uV) seen predominantly in posterior head regions, symmetric and reactive to eye opening and eye closing. Physiologic photic driving was seen during photic stimulation.  Hyperventilation was not performed.     IMPRESSION: This study is within normal limits. No seizures or epileptiform discharges were seen throughout the recording.   A normal interictal EEG does not exclude the diagnosis of epilepsy.   Roald Lukacs Annabelle Harman

## 2022-12-27 NOTE — Progress Notes (Signed)
Initial Nutrition Assessment  DOCUMENTATION CODES:   Underweight  INTERVENTION:   - Ensure Enlive po TID, each supplement provides 350 kcal and 20 grams of protein  - Magic Cup TID with meals, each supplement provides 290 kcal and 9 grams of protein  - MVI with minerals  - Liberalize diet to Regular  NUTRITION DIAGNOSIS:   Inadequate oral intake related to lethargy/confusion as evidenced by meal completion < 25%.  GOAL:   Patient will meet greater than or equal to 90% of their needs  MONITOR:   PO intake, Supplement acceptance, Weight trends  REASON FOR ASSESSMENT:   Consult Assessment of nutrition requirement/status, Diet education  ASSESSMENT:   32 year old male who presented to the ED on 5/23 with chest and abdominal pain. PMH of polysubstance abuse, catatonia.  05/27 - diet advanced to Heart Healthy  Attempted to speak with pt at bedside this morning. Pt had just vomited all over the floor so RD unable to speak with pt. Reached out to RN later in the afternoon to see if pt was appropriate for assessment. Per RN, pt is not appropriate for assessment at this time due to manic behavior and confusion.  Unable to obtain diet and weight history at this time. Pt last seen by inpatient RD 1 year ago and met criteria for moderate malnutrition at that time. Suspect malnutrition persists as pt has continued to lose weight. Reviewed weight history in chart. Pt with a 15.9 kg weight loss since 01/30/22. This is a 21.2% weight loss in less than 11 months which is severe and significant for timeframe.  Pt with Ensure supplements already ordered. Will continue these and add Magic Cups to meal trays. Will also liberalize diet to Regular to provide additional food options at mealtimes. Will order daily MVI with minerals.  Meal Completion: 0-15%  Medications reviewed and include: Ensure Enlive TID, zyprexa, IV thiamine 250 mg TID x 9 doses  Labs reviewed.  NUTRITION - FOCUSED  PHYSICAL EXAM:  Unable to complete at this time.  Diet Order:   Diet Order             Diet regular Room service appropriate? Yes; Fluid consistency: Thin  Diet effective now                   EDUCATION NEEDS:   Not appropriate for education at this time  Skin:  Skin Assessment: Reviewed RN Assessment  Last BM:  no documented BM  Height:   Ht Readings from Last 1 Encounters:  12/25/22 6\' 4"  (1.93 m)    Weight:   Wt Readings from Last 1 Encounters:  12/26/22 58.9 kg    BMI:  Body mass index is 15.81 kg/m.  Estimated Nutritional Needs:   Kcal:  1900-2100  Protein:  90-105 grams  Fluid:  >1.8 L    Mertie Clause, MS, RD, LDN Inpatient Clinical Dietitian Please see AMi[facility] for contact information.

## 2022-12-27 NOTE — Progress Notes (Signed)
CARDIAC REHAB PHASE I   Post MI education materials left in room, pt may review when and if able. Pt not arousing to verbal stimuli. CRP2 order in place per protocol. However pt is not appropriate for CRP2 due to polysubstance abuse and ongoing mental health issues.   [id number]   Woodroe Chen, RN BSN 12/27/2022 2:24 PM

## 2022-12-28 DIAGNOSIS — R7303 Prediabetes: Secondary | ICD-10-CM | POA: Diagnosis not present

## 2022-12-28 DIAGNOSIS — F1120 Opioid dependence, uncomplicated: Secondary | ICD-10-CM | POA: Diagnosis not present

## 2022-12-28 DIAGNOSIS — I25111 Atherosclerotic heart disease of native coronary artery with angina pectoris with documented spasm: Secondary | ICD-10-CM | POA: Diagnosis not present

## 2022-12-28 DIAGNOSIS — F99 Mental disorder, not otherwise specified: Secondary | ICD-10-CM

## 2022-12-28 DIAGNOSIS — I249 Acute ischemic heart disease, unspecified: Secondary | ICD-10-CM | POA: Diagnosis not present

## 2022-12-28 MED ORDER — ENOXAPARIN SODIUM 40 MG/0.4ML IJ SOSY
40.0000 mg | PREFILLED_SYRINGE | INTRAMUSCULAR | Status: DC
  Filled 2022-12-28 (×3): qty 0.4

## 2022-12-28 MED ORDER — OLANZAPINE 5 MG PO TABS
5.0000 mg | ORAL_TABLET | Freq: Once | ORAL | Status: AC
  Administered 2022-12-28: 5 mg via ORAL
  Filled 2022-12-28: qty 1

## 2022-12-28 MED ORDER — OLANZAPINE 5 MG PO TBDP
7.5000 mg | ORAL_TABLET | Freq: Two times a day (BID) | ORAL | Status: DC
  Administered 2022-12-28 – 2022-12-29 (×2): 7.5 mg via ORAL
  Filled 2022-12-28 (×3): qty 1.5

## 2022-12-28 NOTE — Progress Notes (Signed)
This RN called to room by sitter 2/2 pt's request for anxiety medication. This RN asked pt what was bothering him and he endorsed that he needed to get paid, that we "hadn't cut him a check" and that he might just "have to ghost [place]" since rent is due tomorrow. This RN explained to pt that he was IVC'd and that until he was released from care that he could not leave the hospital. This RN also acknowledged pt's stress of having pending bills.  Pt expressed understanding of situation and requested additional freeze pop and juice. Will continue to monitor.

## 2022-12-28 NOTE — Progress Notes (Signed)
Rounding Note    Patient Name: Stephen Shepard Date of Encounter: 12/27/2022  [facility] Cardiologist: None new  Patient Profile     32 y.o. male with complicated history of IV drug abuse, pseudoseizure, catatonic behavior and multiple hospitalizations, ER visits for various complaints to presented with initially abdominal pain at that was epigastric and then became chest pain.  While in the ER he had transient inferior lateral ST elevations leading to code STEMI called.  He was taken to the Cath Lab and found to have normal coronary arteries but inferolateral hypokinesis with troponin elevation up to 6000.   Subjective   Psychiatry very much involved yesterday.  Intermittently requiring restraints which were renewed today.  Has been treated with antipsychotic agents as well as BZD withdrawal.    Last night became somewhat combative and aggressive apparently was trying to spit on staff.  Was placed in restraints with mask on.  This morning, he was sound asleep and not really arousable beyond opening his eyes to acknowledge I was there.  Clearly no major complaints.  No chest pain.  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 => Suspect that the most likely etiology is coronary spasm.  Most likely related to polysubstance abuse. 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 ARB converted to atenolol while in the inpatient setting because of tachycardia.  Somewhat concerning based on potential of cocaine abuse.  Psychiatry specifically asked for atenolol to help crossing the blood-brain barrier. => Would eventually want to convert to a different type of beta-blocker with alpha beta blocking effect such as carvedilol or labetalol if necessary in the off chance that he ends up using cocaine. Once blood pressure is stable, would also consider adding ARB Plan for Plavix monotherapy for at least 6 months-has an aspirin allergy listed which we clearly cannot confirm or deny. As the 48 hours of enoxaparin twice daily dosing.  Convert now to just once daily DVT prophylaxis. Not a candidate for cardiac rehab.  Have canceled [facility] consultation.  I do not think he would be understand the education points either.  Will probably do outpatient consultation.         Polysubstance dependence including opioid type drug, episodic abuse (HCC) Very complicated-the suspicion is that he was using Tranq-dope (xylazine).  Apparently there has been reports of vasospasm in canine models, but not yet documented in human subjects however that is the most likely cause of his transient ST elevations with regional wall motion abnormality and elevated troponin to suggest transient occlusion of a localized vessel from coronary spasm. Currently in withdrawal management but also managing his intermittent bouts of psychosis. On CIWA protocol Record requiring restraints..  Plan was to try to transfer to inpatient psych, however bed availability  is lacking.  Plans are to fax out to other facilities.  Based on the fact that he would be in the hospital for several days going forward and is cleared from a  cardiac standpoint with current medications, we have asked the hospitalist team team back to take over care going forward.  [facility] will sign off.   Medication Recommendations: Continue current medications as ordered.  If his blood pressures start going back up again, would add back the losartan.  If the main issue is tachycardia, would titrate up atenolol to 1-1/2 tablets. Other recommendations (labs, testing, etc): Pertinent labs for following his CIWA protocol etc. Follow up as an outpatient: Would asked that when he is discharged from the facility, that the discharging team contact our office to establish follow-up care.    Inpatient Medications    Scheduled Meds: Indicates changes made in the note above.  amLODipine  10 mg Oral Daily   atenolol  25 mg Oral Daily   atorvastatin  80 mg Oral Daily   Chlorhexidine Gluconate Cloth  6 each Topical Daily   clopidogrel  75 mg Oral Q breakfast   enoxaparin (LOVENOX) injection  80 mg Subcutaneous Q12H   feeding supplement  237 mL Oral TID BM   LORazepam  2 mg Oral BID   OLANZapine zydis  5 mg Oral BID   sodium chloride flush  3 mL Intravenous Q12H   Continuous Infusions:  sodium chloride     thiamine (VITAMIN B1) injection 250 mg (12/27/22 0027)   PRN Meds: sodium chloride, acetaminophen, albuterol, HYDROmorphone (DILAUDID) injection, ondansetron (ZOFRAN) IV, sodium chloride flush   Vital Signs    Vitals:   12/26/22 1800 12/26/22 1906 12/27/22 0000 12/27/22 0304  BP: (!) 142/105 (!) 151/104 (!) 135/106 (!) 142/107  Pulse: (!) 117   (!) 111  Resp: 20 (!) 32 18 20  Temp:  98.8 F (37.1 C)  98.5 F (36.9 C)  TempSrc:  Oral  Oral  SpO2: 100%   97%  Weight:      Height:        Intake/Output Summary (Last 24 hours) at 12/27/2022 0957 Last data filed at 12/27/2022 0900 Gross per 24 hour  Intake 960 ml  Output 1300 ml  Net -340 ml      12/26/2022    6:00 AM 12/26/2022    5:00 AM 12/25/2022    6:16 AM  Last 3  Weights  Weight (lbs) 129 lb 13.6 oz 129 lb 13.6 oz 175 lb  Weight (kg) 58.9 kg 58.9 kg 79.379 kg      Telemetry    Sinus rhythm- Personally Reviewed  ECG    Sinus rhythm-rate 81 bpm.  There is an appearance of evolutionary changes of inferior MI with subtle ST elevations and T wave inversions but no Q waves.  Right atrial enlargement.  Cannot exclude RVH.- Personally Reviewed  Cardiac Studies   Cardiac catheterization 12/25/2022: Angiographically normal coronary arteries => Suspect Coronary Spasm Mildly reduced EF with apparent mid to apical inferior hypokinesis-poor imaging.  Recommend 2D echo. Normal LVEDP Dominance: Right  TTE 12/25/2022: EF 50 to 55%.  Moderate HK of inferolateral and apical lateral wall.  GR 1 DD.  Hyperdynamic RV.  More valves.  EF mildly reduced with wall motion abnormality A-fib previous echo.  Physical Exam   GEN: Lying in bed sleeping.  Not easily aroused. Neck: No JVD, or bruit Cardiac: RRR, no murmurs, rubs, or gallops.  Respiratory: Mostly CTAB with some  coarse breath sounds throughout.  Did not cooperate with exam. GI: Soft, , non-distended ; complains of abdominal pain on palpitation Psych: Sleeping.  Even if not asleep, not acknowledging my questions.  Labs    High Sensitivity Troponin:   Recent Labs  Lab 12/25/22 0348 12/25/22 0613 12/25/22 0932 12/25/22 1131  TROPONINIHS 61* 1,425* 4,204* 5,696*     Chemistry Recent Labs  Lab 12/21/22 2235 12/25/22 0348 12/26/22 0319 12/26/22 1253  NA 139 139 141  --   K 3.7 3.6 3.9  --   CL 102 102 103  --   CO2 28 26 22   --   GLUCOSE 85 116* 110*  --   BUN 15 16 22*  --   CREATININE 1.21 1.18 1.13  --   CALCIUM 9.6 9.5 9.1  --   MG  --   --   --  2.1  PROT  --  8.3*  --   --   ALBUMIN  --  4.3  --   --   AST  --  40  --   --   ALT  --  43  --   --   ALKPHOS  --  75  --   --   BILITOT  --  1.3*  --   --   GFRNONAA >60 >60 >60  --   ANIONGAP 9 11 16*  --     Lipids  Recent Labs   Lab 12/25/22 0708  CHOL 183  TRIG 68  HDL 47  LDLCALC 122*  CHOLHDL 3.9    Hematology Recent Labs  Lab 12/21/22 2235 12/25/22 0348 12/26/22 0319  WBC 10.2 10.5 13.7*  RBC 4.62 5.36 5.54  HGB 13.2 15.3 16.1  HCT 40.3 45.2 46.0  MCV 87.2 84.3 83.0  MCH 28.6 28.5 29.1  MCHC 32.8 33.8 35.0  RDW 13.8 13.2 13.5  PLT 206 223 158   Thyroid No results for input(s): "TSH", "FREET4" in the last 168 hours.  BNPNo results for input(s): "BNP", "PROBNP" in the last 168 hours.  DDimer No results for input(s): "DDIMER" in the last 168 hours.   Radiology     CT Angio Chest/Abd/Pel for Dissection W and/or Wo Contrast  Result Date: 12/25/2022 CLINICAL DATA:  Acute aortic syndrome suspected EXAM: CT ANGIOGRAPHY CHEST, ABDOMEN AND PELVIS TECHNIQUE: Abdominal CT 07/28/2022.  Chest CT 12/15/2021 Multidetector CT imaging through the chest, abdomen and pelvis was performed using the standard protocol during bolus administration of intravenous contrast. Multiplanar reconstructed images and MIPs were obtained and reviewed to evaluate the vascular anatomy. RADIATION DOSE REDUCTION: This exam was performed according to the departmental dose-optimization program which includes automated exposure control, adjustment of the mA and/or kV according to patient size and/or use of iterative reconstruction technique. CONTRAST:  OMNIPAQUE IOHEXOL 350 MG/ML SOLN COMPARISON:  None Available. FINDINGS: CTA CHEST FINDINGS Cardiovascular: Preferential opacification of the thoracic aorta. No evidence of thoracic aortic aneurysm or dissection. Normal heart size. No pericardial effusion. Mediastinum/Nodes: No hematoma or adenopathy Lungs/Pleura: There is no edema, consolidation, effusion, or pneumothorax. Musculoskeletal: No acute finding or cause for chest pain Review of the MIP images confirms the above findings. CTA ABDOMEN AND PELVIS FINDINGS VASCULAR Aorta: Negative for dissection or aneurysm.  No stenosis. Celiac:  Large branches with some tortuosity accentuated for age. No stenosis, aneurysm, or beading. SMA: Replaced right hepatic artery. Prominent size arteries for age with some tortuosity. No stenosis, beading, or aneurysm Renals: Smoothly contoured and widely patent. Early  branching right renal artery. IMA: Smoothly contoured and patent Inflow: Unremarkable Veins: Unremarkable in the arterial phase Review of the MIP images confirms the above findings. NON-VASCULAR Hepatobiliary: No focal liver abnormality.No evidence of biliary obstruction or stone. Pancreas: Unremarkable. Spleen: Unremarkable. Adrenals/Urinary Tract: Negative adrenals. No hydronephrosis or stone. Unremarkable bladder. Stomach/Bowel:  No obstruction. No appendicitis. Vascular/Lymphatic: No acute vascular abnormality. No mass or adenopathy. Reproductive:No pathologic findings. Other: No ascites or pneumoperitoneum. Musculoskeletal: No acute abnormalities. Review of the MIP images confirms the above findings. IMPRESSION: Negative for acute aortic syndrome.  No specific cause for symptoms. Electronically Signed   By: Tiburcio Pea M.D.   On: 12/25/2022 06:47   CT Head Wo Contrast  Result Date: 12/25/2022 CLINICAL DATA:  Mental status change with unknown cause EXAM: CT HEAD WITHOUT CONTRAST TECHNIQUE: Contiguous axial images were obtained from the base of the skull through the vertex without intravenous contrast. RADIATION DOSE REDUCTION: This exam was performed according to the departmental dose-optimization program which includes automated exposure control, adjustment of the mA and/or kV according to patient size and/or use of iterative reconstruction technique. COMPARISON:  Four days ago FINDINGS: Brain: No evidence of acute infarction, hemorrhage, hydrocephalus, extra-axial collection or mass lesion/mass effect. Vascular: No hyperdense vessel or unexpected calcification. Skull: Normal. Negative for fracture or focal lesion. Sinuses/Orbits: No acute  finding. Other: Band of streak artifact across the ventral brain. IMPRESSION: Negative motion degraded head CT. Electronically Signed   By: Tiburcio Pea M.D.   On: 12/25/2022 04:10   DG Chest 1 View  Result Date: 12/25/2022 CLINICAL DATA:  Hiccups and chest pain EXAM: CHEST  1 VIEW COMPARISON:  08/05/2022 FINDINGS: The heart size and mediastinal contours are within normal limits. Both lungs are clear. The visualized skeletal structures are unremarkable. IMPRESSION: No active disease. Electronically Signed   By: Deatra Robinson M.D.   On: 12/25/2022 03:41       For questions or updates, please contact [facility] Please consult [email or web address] for contact info under        Signed, Bryan Lemma, MD  12/27/2022, 9:57 AM

## 2022-12-28 NOTE — Progress Notes (Signed)
Pt found to have urinated in the bed @approx  0615. While performing pt hygiene/pericare, pt became belligerent, verbally abusive to staff, and attempted to spit on staff. Reorientation ineffective. Pt mask placed to protect staff from pt's bodily fluids.

## 2022-12-28 NOTE — Progress Notes (Signed)
Pt LUA PIV infiltrated at end of pt's Vitamin B1 infusion. Pharmacy notified per policy and endorsed to elevate extremity. Will implement elevation and continue to monitor.

## 2022-12-28 NOTE — Progress Notes (Signed)
Pt awoke with evening med pass alert and oriented x4 and kindly asked for something to eat. Unlike previous shift this RN had with pt, pt only speaks with/to individuals actually in room and doesn't appear to be "hearing voices". Pt remains free of restraints and is currently following all safety precautions. Due to his IVC, sitter remains at bedside. Will continue to monitor.

## 2022-12-28 NOTE — TOC Progression Note (Addendum)
Transition of Care [facility]) - Progression Note    Patient Details  Name: Stephen Shepard MRN: [id number] Date of Birth: 1991-05-02  Transition of [facility]) CM/SW Contact  Leander Rams, LCSW Phone Number: 12/28/2022, 12:04 PM  Clinical Narrative:    CSW was informed that pt will need to be faxed out to inpatient psychiatry. [facility] does not have any beds available today. CSW also faxed to [facility], [facility], [facility], [facility], [facility] and [facility]. Request are pending.   12:45PM CSW faxed out to 18 additional facilities.   TOC will continue to follow.         Expected Discharge Plan and Services                                               Social Determinants of Health (SDOH) Interventions SDOH Screenings   Tobacco Use: High Risk (12/26/2022)    Readmission Risk Interventions     No data to display         Oletta Lamas, MSW, LCSWA, LCASA Transitions of Care  Clinical Social Worker I

## 2022-12-28 NOTE — Progress Notes (Signed)
  Progress Note   Patient: Stephen Shepard ZOX:[id number] DOB: Jun 24, 1991 DOA: 12/25/2022     3 DOS: the patient was seen and examined on 12/28/2022   Brief hospital course: Stephen Shepard is a 32 y.o. male with past medical history of polysubstance abuse presenting with chest pain with elevated troponin.  His last hospitalization was from 07/28/2022-08/02/2022 for intractable n/v with polysubstance abuse and possible cannabinoid hyperemesis.  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  He was deemed to not require psychiatric hospitalization at the time of discharge.  [facility] had initially been consulted for management of psychiatric issues for which psychiatry has been formally consulted.  Thereafter asked to take over patient's care as he was medically stable from a cardiac standpoint.  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  Urinalysis and chest x-ray from 5/27 did not note any significant abnormality or concern for infection -Recheck CBC tomorrow morning   Psychosis Psychiatric illness -Prior symptoms during 01/2022 admission.  -IVC initiated due to psychosis -Medication management per psychiatry -Appreciate psychiatry consultative services, will follow-up for any further recommendations  Polysubstance dependence including opioid type drug, episodic abuse (HCC) -Cessation encouraged  Prediabetes -A1c is 6.4, at the upper limit of pre-diabetes        Subjective:  Patient did not offer any history at this time.  Physical Exam: Vitals:   12/27/22 1520 12/27/22 1915 12/28/22 0008 12/28/22 0828  BP: (!) 129/108 (!) 142/105 (!) 124/91   Pulse:  95 98 98  Resp: 20 (!) 23 17   Temp:  98.7 F (37.1 C) 98.7 F (37.1 C) 99.2 F (37.3 C)  TempSrc:  Oral Oral Oral  SpO2:  97% 96% 99%  Weight:    57.4 kg  Height:    6\' 3"  (1.905 m)   Patient lethargic and is arousable currently with repeated stimuli. Tachycardic with no murmur appreciated. Abdomen soft with normal bowel sounds. Data Reviewed:    Family Communication: None  Disposition: Status is: Inpatient Remains inpatient appropriate because: Awaiting placement  Planned Discharge Destination: Inpatient behavioral health    Time spent: 20 minutes  Author: Clydie Braun, MD 12/28/2022 11:16 AM  For on call review [email or web address].
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
## 2022-12-29 DIAGNOSIS — I249 Acute ischemic heart disease, unspecified: Secondary | ICD-10-CM | POA: Diagnosis not present

## 2022-12-29 LAB — COMPREHENSIVE METABOLIC PANEL
ALT: 31 U/L (ref 0–44)
AST: 30 U/L (ref 15–41)
Albumin: 3.7 g/dL (ref 3.5–5.0)
Alkaline Phosphatase: 62 U/L (ref 38–126)
Anion gap: 10 (ref 5–15)
BUN: 18 mg/dL (ref 6–20)
CO2: 24 mmol/L (ref 22–32)
Calcium: 9.1 mg/dL (ref 8.9–10.3)
Chloride: 102 mmol/L (ref 98–111)
Creatinine, Ser: 1.08 mg/dL (ref 0.61–1.24)
GFR, Estimated: >60 mL/min (ref >60)
Glucose, Bld: 115 mg/dL — ABNORMAL HIGH (ref 70–99)
Potassium: 3.6 mmol/L (ref 3.5–5.1)
Sodium: 136 mmol/L (ref 135–145)
Total Bilirubin: 1 mg/dL (ref 0.3–1.2)
Total Protein: 7.1 g/dL (ref 6.5–8.1)

## 2022-12-29 LAB — CBC
HCT: 49.6 % (ref 39.0–52.0)
Hemoglobin: 17.2 g/dL — ABNORMAL HIGH (ref 13.0–17.0)
MCH: 29.8 pg (ref 26.0–34.0)
MCHC: 34.7 g/dL (ref 30.0–36.0)
MCV: 85.8 fL (ref 80.0–100.0)
Platelets: 231 10*3/uL (ref 150–400)
RBC: 5.78 MIL/uL (ref 4.22–5.81)
RDW: 14.1 % (ref 11.5–15.5)
WBC: 14.7 10*3/uL — ABNORMAL HIGH (ref 4.0–10.5)
nRBC: 0.1 % (ref 0.0–0.2)

## 2022-12-29 LAB — LIPOPROTEIN A (LPA): Lipoprotein (a): 280.6 nmol/L — ABNORMAL HIGH (ref <75.0)

## 2022-12-29 LAB — VITAMIN B1: Vitamin B1 (Thiamine): 171.4 nmol/L (ref 66.5–200.0)

## 2022-12-29 LAB — MAGNESIUM: Magnesium: 2.1 mg/dL (ref 1.7–2.4)

## 2022-12-29 MED ORDER — OLANZAPINE 5 MG PO TBDP
5.0000 mg | ORAL_TABLET | Freq: Two times a day (BID) | ORAL | Status: DC
  Administered 2022-12-29 – 2022-12-30 (×2): 5 mg via ORAL
  Filled 2022-12-29 (×3): qty 1

## 2022-12-29 MED ORDER — TRIMETHOBENZAMIDE HCL 100 MG/ML IM SOLN
200.0000 mg | Freq: Four times a day (QID) | INTRAMUSCULAR | Status: DC | PRN
  Filled 2022-12-29: qty 2

## 2022-12-29 MED ORDER — OLANZAPINE 5 MG PO TBDP: 5.0000 mg | ORAL_TABLET | Freq: Two times a day (BID) | ORAL | 0 refills | Status: AC

## 2022-12-29 MED ORDER — AMLODIPINE BESYLATE 2.5 MG PO TABS
2.5000 mg | ORAL_TABLET | Freq: Every day | ORAL | Status: DC
  Administered 2022-12-30: 2.5 mg via ORAL
  Filled 2022-12-29: qty 1

## 2022-12-29 MED ORDER — NICOTINE 14 MG/24HR TD PT24
14.0000 mg | MEDICATED_PATCH | Freq: Every day | TRANSDERMAL | Status: DC
  Administered 2022-12-29 – 2022-12-30 (×2): 14 mg via TRANSDERMAL
  Filled 2022-12-29 (×2): qty 1

## 2022-12-29 MED ORDER — LORAZEPAM 1 MG PO TABS
1.0000 mg | ORAL_TABLET | Freq: Two times a day (BID) | ORAL | Status: DC | PRN
  Administered 2022-12-29 – 2022-12-30 (×2): 1 mg via ORAL
  Filled 2022-12-29 (×2): qty 1

## 2022-12-29 NOTE — Progress Notes (Signed)
Patient voiced concerns to RN related to his need to remove funds from the ATM to give to his significant other to pay bills.   RN reached out to MD Allena Katz and FNP Rosario Adie to inform them of patient's request.   MD Allena Katz advised to defer to psychiatry decision on if a member of the staff could escort patient to the ATM.  FNP Starkes-Perry called and spoke with the patient and patient discussed his concerns and his request with FNP.   FNP Starkes-Perry advised RN it is okay for a member of the staff to escort patient to the ATM as needed upon patient request.

## 2022-12-29 NOTE — Progress Notes (Signed)
[facility] Progress Note Patient: Stephen Shepard WUJ:[id number] DOB: 1990-10-29 DOA: 12/25/2022  DOS: the patient was seen and examined on 12/29/2022  Brief hospital course: Octavion Leap is a 32 y.o. male with past medical history of polysubstance abuse presenting with chest pain with elevated troponin.  His last hospitalization was from 07/28/2022-08/02/2022 for intractable n/v with polysubstance abuse and possible cannabinoid hyperemesis.  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  He was deemed to not require psychiatric hospitalization at the time of discharge.  [facility] had initially been consulted for management of psychiatric issues for which psychiatry has been formally consulted.  Thereafter asked to take over patient's care as he was medically stable from a cardiac standpoint.  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  Urinalysis and chest x-ray from 5/27 did not note any significant abnormality or concern for infection   Psychosis Psychiatric illness -Prior symptoms during 01/2022 admission.  -IVC initiated due to psychosis -Medication management per psychiatry -Appreciate psychiatry consultative services, will follow-up for any further recommendations   Polysubstance dependence including opioid type drug, episodic abuse (HCC) -Cessation encouraged   Prediabetes -A1c is 6.4, at the upper limit of pre-diabetes  Underweight Placing the patient at high risk of  poor outcome. Body mass index is 15.81 kg/m.    Subjective: Patient did not interact with me.  No nausea no vomiting no acute events overnight.  No agitation noted.  Interactive with nursing staff, NT as well as psychiatry.  Physical Exam: Nonverbal.  Not following command.  Although allowed examination. In no distress. Clear to auscultation. S1-S2 present. Bowel sound present. No edema.  Data Reviewed: I have Reviewed nursing notes, Vitals, and Lab results. Since last encounter, pertinent lab results CBC and BMP   . I have ordered test including CBC  . I have discussed pt's care plan and test results with psychiatric  .   Disposition: Status is: Inpatient Remains inpatient appropriate because: Need for psych placement  enoxaparin (LOVENOX) injection 40 mg Start: 12/28/22 1000   Family Communication: No one at bedside Level of care: Med-Surg   Vitals:   12/28/22 1624 12/28/22 1905 12/28/22 2328 12/29/22 0357  BP: 101/64 (!) 113/99 105/78 123/65  Pulse: 91 88 98 87  Resp: (!) 24 (!) 21 (!) 23 (!) 22  Temp: 97.8 F (36.6 C) (!) 97.5 F (36.4 C) 98.7 F (37.1 C) 97.6 F (36.4 C)  TempSrc: Oral Axillary Oral Oral  SpO2: 92% 94% 93% 95%  Weight:      Height:         Author: Lynden Oxford, MD 12/29/2022 5:49 PM  Please look on [email or web address] to find out who is on call.

## 2022-12-29 NOTE — Progress Notes (Signed)
Patient inquiring about the process moving forward regarding his missing cell phone.   RN spoke with Department Director Charlane Ferretti who advised that the missing phone has been reported and risk management will be contacting the patient and/or patient's designated contact.     Spoke with patient and he requested his significant other be point of contact due to patient not having a cell phone.  Contact:  Stephen Shepard [phone]. Unit director [facility] notified.

## 2022-12-29 NOTE — Progress Notes (Signed)
Patient requested to go to ATM, sitter at bedside. RN explained to patient the risk of keeping cash at bedside.  Patient verbalized understanding and accepts responsibility of keeping cash with him.  Patient request envelop for cash.  Envelope given to patient.  Patient taken to ATM by sitter, and brought back to room.  Safety measures continue.

## 2022-12-29 NOTE — Discharge Instructions (Addendum)
Avoid all Illicit substances at this time.   EMERGENCY Suicide hotline: 988 Emergency: 911 [facility] URGENT CARE: [address] FIRST FLOOR. [place], [place] [address]. [phone]  Mobile Crisis Response Teams Listed by counties in vicinity of [facility] providers [facility] [phone] [facility] [phone] [facility] [phone] [facility] [phone] [facility]                * [facility] [phone]                * [employer] [facility] [phone]  [facility] [phone] [facility]  [phone] * [employer] [employer] [facility] [phone]  Dear Monico Blitz,  Please make regular appointments with an outpatient psychiatrist and other doctors once you leave the hospital (if any, otherwise, please see below for resources to make an appointment).   To see which pharmacy near you is the CHEAPEST for certain medications, please use GoodRx. It is free website and has a free phone app.    Also consider looking at [facility] $4.00 or [facility]'s $7.00 prescription list. Both are free to view if googled "[facility] $4 prescription" and "public's $7 prescription". These are set prices, no insurance required. [facility]'s low cost medications: $4-$15 for 30days prescriptions or $10-$38 for 90days prescriptions  For issues with sleep, please use this free app for insomnia called CBT-I. Let your doctors and therapists know so they can help with extra tips and tricks or for guidance and accountability. NO ADDS on the app.     Intensive Outpatient Programs for Substance Use Treatment  [facility] [address] [place],  [place] [address] [phone] phone  [facility] [address] [place], [place], [address] [phone]  phone [phone] fax  [facility] [address] [place], [place], [address] [phone] phone  [facility] ([facility]) [address], [place], [address] [phone] phone  [facility] [address] [place], [place], [address] [phone] phone [phone] fax  [facility] [place] [address], [place], [address] [phone] phone  [employer] [phone] [address], [place], [address] [phone] phone [phone] fax   Substance Abuse Resources  [facility] - Admissions are currently completed Monday through Friday at 8am; both appointments and walk-ins are accepted.  Any individual that is a [facility] resident may present for a substance abuse screening and assessment for admission.  A person may be referred by numerous sources or self-refer.   Potential clients will be screened for medical necessity and appropriateness for the program.  Clients must meet criteria for high-intensity residential treatment services.  If clinically appropriate, a client will continue with the comprehensive clinical assessment and intake process, as well as enrollment in the [facility] Network.   Address: [address] [place], [place] [address] Admin Hours: Mon-Fri 8AM to [facility] Hours: 24/7 Phone: [phone] Fax: [phone]   [facility] ([facility]) Facility Based Crisis:  These are 3 locations for services: Please call before arrival    Address: [address] [place], [place] [address] Phone: [phone]   Address: [address] [place], [place] [address] Phone#: [phone]   Address: [address] [place], [place] [address] Phone#: [phone]     [facility] ([facility]): (offers outpatient therapy and intensive outpatient substance abuse therapy).  [address] [place], [place]  [address] Phone: [phone]   [facility]  [place]: Offers FREE recovery skills classes, support groups, 1:1 Peer Support, and Compeer Classes. [address], [place], [place] [address] Phone: [phone] (Call to complete intake).  [facility] [address] [place], [place] [address] Phone: [phone] ext: [phone] [facility] provides food, shelter and other programs and services to the homeless men of [place] through our [employer].   By offering safe shelter, three meals a day, clean clothing, Biblical counseling, financial planning, vocational training, GED/education and employment assistance, we've helped mend the shattered lives of many homeless men since opening in 1974.   We have approximately 267 beds available, with a max of 312 beds including mats for emergency situations and currently house an average of 270 men a night.   Prospective Client Check-In Information Photo ID Required (State/ Out of State/ [facility]) - if photo ID is not available, clients are required to have a printout of a police/sheriff's criminal history report. Help out with chores around the Mission. No sex offender of any type (pending, charged, registered and/or any other sex related offenses) will be permitted to check in. Must be willing to abide by all rules, regulations, and policies established by the [employer]. The following will be provided - shelter, food, clothing, and biblical counseling. If you or someone you know is in need of assistance at our [facility] shelter in [place], [place], please call [phone] ext. [phone].   [facility]-will provide timely access to mental health services for children and adolescents (4-17) and adults presenting in a mental health crisis. The program is designed for those who need urgent Behavioral Health or Substance Use treatment and are not experiencing a medical crisis that would typically require an emergency room visit.     [address] [place], [place] [address] Phone: [phone] [email or web address]   [facility]: Phone#: [phone]   [facility] Intensive Outpatient Program (SAIOP) means structured individual and group addiction activities and services that are provided at an outpatient program designed to assist adult and adolescent consumers to begin recovery and learn skills for recovery maintenance. The [facility] SAIOP program is offered at least 3 hours a day, 3 days a week.SAIOP services shall include a structured program consisting of, but not limited to, the following services: Individual counseling and support; Group counseling and support; Family counseling, training or support; Biochemical assays to identify recent drug use (e.g., urine drug screens); Strategies for relapse prevention to include community and social support systems in treatment; Life skills; Crisis contingency planning; Disease Management; and Treatment support activities that have been adapted or specifically designed for persons with physical disabilities, or persons with co-occurring disorders of mental illness and substance abuse/dependence or mental retardation/developmental disability and substance abuse/dependence. Phone: [phone]   [facility] [address] [place], [place] [address] Phone: [phone] Admissions team is available 24/7  Phone: [phone]. Fax: [phone]  [facility]     Admissions [address] [place], [place] [address] [phone] [facility] 24-Hour Call Center: [phone]  Behavioral Health Crisis Line: [phone]

## 2022-12-29 NOTE — Plan of Care (Signed)
  Problem: Education: Goal: Knowledge of General Education information will improve Description: Including pain rating scale, medication(s)/side effects and non-pharmacologic comfort measures Outcome: Progressing   Problem: Health Behavior/Discharge Planning: Goal: Ability to manage health-related needs will improve Outcome: Progressing   Problem: Clinical Measurements: Goal: Ability to maintain clinical measurements within normal limits will improve Outcome: Progressing Goal: Will remain free from infection Outcome: Progressing Goal: Diagnostic test results will improve Outcome: Progressing Goal: Respiratory complications will improve Outcome: Progressing Goal: Cardiovascular complication will be avoided Outcome: Progressing   Problem: Activity: Goal: Risk for activity intolerance will decrease Outcome: Progressing   Problem: Nutrition: Goal: Adequate nutrition will be maintained Outcome: Progressing   Problem: Coping: Goal: Level of anxiety will decrease Outcome: Progressing   Problem: Elimination: Goal: Will not experience complications related to bowel motility Outcome: Progressing Goal: Will not experience complications related to urinary retention Outcome: Progressing   Problem: Pain Managment: Goal: General experience of comfort will improve Outcome: Progressing   Problem: Safety: Goal: Ability to remain free from injury will improve Outcome: Progressing   Problem: Skin Integrity: Goal: Risk for impaired skin integrity will decrease Outcome: Progressing   Problem: Education: Goal: Understanding of CV disease, CV risk reduction, and recovery process will improve Outcome: Progressing Goal: Individualized Educational Video(s) Outcome: Progressing   Problem: Activity: Goal: Ability to return to baseline activity level will improve Outcome: Progressing   Problem: Cardiovascular: Goal: Ability to achieve and maintain adequate cardiovascular perfusion  will improve Outcome: Progressing Goal: Vascular access site(s) Level 0-1 will be maintained Outcome: Progressing   Problem: Health Behavior/Discharge Planning: Goal: Ability to safely manage health-related needs after discharge will improve Outcome: Progressing   Problem: Safety: Goal: Non-violent Restraint(s) Outcome: Progressing   

## 2022-12-29 NOTE — Hospital Course (Signed)
Stephen Shepard is a 32 y.o. male with past medical history of polysubstance abuse presenting with chest pain with elevated troponin.  His last hospitalization was from 07/28/2022-08/02/2022 for intractable n/v with polysubstance abuse and possible cannabinoid hyperemesis.  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  He was deemed to not require psychiatric hospitalization at the time of discharge.  [facility] had initially been consulted for management of psychiatric issues for which psychiatry has been formally consulted.  Thereafter asked to take over patient's care as he was medically stable from a cardiac standpoint.

## 2022-12-29 NOTE — Progress Notes (Signed)
From approximately midnight, pt appeared to sleep comfortably the majority of the shift. This morning, pt sitting on side of bed, appearing anxious but overall pleasant and requesting a nicotine patch. Provider on-call notified.
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
## 2022-12-30 DIAGNOSIS — F1920 Other psychoactive substance dependence, uncomplicated: Secondary | ICD-10-CM | POA: Diagnosis not present

## 2022-12-30 DIAGNOSIS — F29 Unspecified psychosis not due to a substance or known physiological condition: Secondary | ICD-10-CM | POA: Diagnosis not present

## 2022-12-30 DIAGNOSIS — I249 Acute ischemic heart disease, unspecified: Secondary | ICD-10-CM | POA: Diagnosis not present

## 2022-12-30 MED ORDER — AMLODIPINE BESYLATE 2.5 MG PO TABS: 2.5000 mg | ORAL_TABLET | Freq: Every day | ORAL | 0 refills | Status: AC

## 2022-12-30 MED ORDER — ATORVASTATIN CALCIUM 80 MG PO TABS: 80.0000 mg | ORAL_TABLET | Freq: Every day | ORAL | 0 refills | Status: AC

## 2022-12-30 MED ORDER — HYDROXYZINE PAMOATE 25 MG PO CAPS: 25.0000 mg | ORAL_CAPSULE | Freq: Three times a day (TID) | ORAL | 0 refills | Status: AC | PRN

## 2022-12-30 MED ORDER — NICOTINE 14 MG/24HR TD PT24: 14.0000 mg | MEDICATED_PATCH | Freq: Every day | TRANSDERMAL | 0 refills | Status: AC

## 2022-12-30 MED ORDER — CLOPIDOGREL BISULFATE 75 MG PO TABS: 75.0000 mg | ORAL_TABLET | Freq: Every day | ORAL | 1 refills | Status: AC

## 2022-12-30 NOTE — Progress Notes (Signed)
Patient ID: Stephen Shepard, male   DOB: Nov 04, 1990, 32 y.o.   MRN: [id number]    An After Visit Summary was printed and given to the patient.  Patient education given on medication changes, diet, activity and follow up appointments and the patient expresses understanding and acceptance of instructions.   IVC rescind signed by psychiatry and faxed to [facility]. Fax receipt in chart.   Lidia Collum 12/30/2022 11:52 AM
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
## 2022-12-30 NOTE — Progress Notes (Signed)
Change in commitment form uploaded to [employer] system for [facility], case number [id number], envelope number [id number].   Dellie Burns, MSW, LCSW [phone] (coverage)

## 2022-12-30 NOTE — Plan of Care (Signed)
Patient ID: Stephen Shepard, male   DOB: 11/10/1990, 32 y.o.   MRN: [id number]  Problem: Education: Goal: Knowledge of General Education information will improve Description: Including pain rating scale, medication(s)/side effects and non-pharmacologic comfort measures Outcome: Adequate for Discharge   Problem: Health Behavior/Discharge Planning: Goal: Ability to manage health-related needs will improve Outcome: Adequate for Discharge   Problem: Clinical Measurements: Goal: Ability to maintain clinical measurements within normal limits will improve Outcome: Adequate for Discharge Goal: Will remain free from infection Outcome: Adequate for Discharge Goal: Diagnostic test results will improve Outcome: Adequate for Discharge Goal: Respiratory complications will improve Outcome: Adequate for Discharge Goal: Cardiovascular complication will be avoided Outcome: Adequate for Discharge   Problem: Activity: Goal: Risk for activity intolerance will decrease Outcome: Adequate for Discharge   Problem: Nutrition: Goal: Adequate nutrition will be maintained Outcome: Adequate for Discharge   Problem: Coping: Goal: Level of anxiety will decrease Outcome: Adequate for Discharge   Problem: Elimination: Goal: Will not experience complications related to bowel motility Outcome: Adequate for Discharge Goal: Will not experience complications related to urinary retention Outcome: Adequate for Discharge   Problem: Pain Managment: Goal: General experience of comfort will improve Outcome: Adequate for Discharge   Problem: Safety: Goal: Ability to remain free from injury will improve Outcome: Adequate for Discharge   Problem: Skin Integrity: Goal: Risk for impaired skin integrity will decrease Outcome: Adequate for Discharge   Problem: Education: Goal: Understanding of CV disease, CV risk reduction, and recovery process will improve Outcome: Adequate for Discharge Goal: Individualized  Educational Video(s) Outcome: Adequate for Discharge   Problem: Activity: Goal: Ability to return to baseline activity level will improve Outcome: Adequate for Discharge   Problem: Cardiovascular: Goal: Ability to achieve and maintain adequate cardiovascular perfusion will improve Outcome: Adequate for Discharge Goal: Vascular access site(s) Level 0-1 will be maintained Outcome: Adequate for Discharge   Problem: Health Behavior/Discharge Planning: Goal: Ability to safely manage health-related needs after discharge will improve Outcome: Adequate for Discharge   Problem: Safety: Goal: Non-violent Restraint(s) Outcome: Adequate for Discharge   Problem: Inadequate Intake (NI-2.1) Goal: Food and/or nutrient delivery Description: Individualized approach for food/nutrient provision. Outcome: Adequate for Discharge  Lidia Collum, RN

## 2023-01-02 NOTE — Discharge Summary (Signed)
Physician Discharge Summary   Patient: Stephen Shepard MRN: [id number] DOB: 1990-09-13  Admit date:     12/25/2022  Discharge date: 12/30/2022  Discharge Physician: Lynden Oxford  PCP: Patient, [name]  Recommendations at discharge: Follow-up with PCP as well as behavioral health.   Follow-up Information     [facility] Follow up.   Specialty: Behavioral Health Why: Medication management Contact information: [address] [place] [address] [phone]        [facility], [facility] Follow up.   Specialty: Behavioral Health Why: Drug counseling Contact information: [address] [place] [address] [phone]         Services, [facility] Follow up.   Why: Inpatient residential Rehab Contact information: [address] [place] [address] [phone]               Discharge Diagnoses: Principal Problem:   Acute coronary syndrome with high troponin [facility]) Active Problems:   Prediabetes   Coronary artery disease involving native coronary artery of native heart with angina pectoris with documented spasm (HCC)   Polysubstance dependence including opioid type drug, episodic abuse [facility])   Psychiatric illness   Psychosis [facility])  Hospital Course: Stephen Shepard is a 32 y.o. male with past medical history of polysubstance abuse presenting with chest pain with elevated troponin.  His last hospitalization was from 07/28/2022-08/02/2022 for intractable n/v with polysubstance abuse and possible cannabinoid hyperemesis.  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  He was deemed to not require psychiatric hospitalization at the time of discharge.  [facility] had initially been consulted for management of psychiatric  issues for which psychiatry has been formally consulted.  Thereafter asked to take over patient's care as he was medically stable from a cardiac standpoint.  Assessment and Plan  Acute coronary syndrome with high troponin (HCC) Coronary artery disease involving native coronary artery of native heart with angina pectoris with documented spasm (HCC) -Negative cath -Plavix  recommended for at least 6 months -Continue statin -Management per cardiology   Leukocytosis WBC was noted to be elevated at 13.7 on 5/28.  Urinalysis and chest x-ray from 5/27 did not note any significant abnormality or concern for infection   Psychosis Psychiatric illness -Prior symptoms during 01/2022 admission.  -IVC initiated due to psychosis, IVC was rescinded by psychiatry before discharge. -Medication management per psychiatry -Appreciate psychiatry consultative services, will follow-up for any further recommendations psychiatry recommended the patient to be discharged home from their point of view.   Polysubstance dependence including opioid type drug, episodic abuse (HCC) -Cessation encouraged   Prediabetes -A1c is 6.4, at the upper limit of pre-diabetes   Underweight Placing the patient at high risk of poor outcome. Body mass index is 15.81 kg/m.    Consultants:  Primary admission with cardiology  Procedures performed:  Cardiac Catheterization   DISCHARGE MEDICATION: Allergies as of 12/30/2022       Reactions   Keppra [levetiracetam] Anaphylaxis   Motrin [ibuprofen] Anaphylaxis   Nitrostat [nitroglycerin] Shortness Of Breath, Palpitations, Other (See Comments)   Severe chest pain   Wasp Venom Protein Hives, Swelling        Medication List     STOP taking these medications    albuterol 108 (90 Base) MCG/ACT inhaler Commonly known as: VENTOLIN HFA  benzonatate 100 MG capsule Commonly known as: TESSALON   Guaifenesin 1200 MG Tb12       TAKE these medications    acetaminophen  500 MG tablet Commonly known as: TYLENOL Take 500 mg by mouth every 6 (six) hours as needed for mild pain.   amLODipine 2.5 MG tablet Commonly known as: NORVASC Take 1 tablet (2.5 mg total) by mouth daily.   atorvastatin 80 MG tablet Commonly known as: LIPITOR Take 1 tablet (80 mg total) by mouth daily.   clopidogrel 75 MG tablet Commonly known as: PLAVIX Take 1 tablet (75 mg total) by mouth daily with breakfast.   hydrOXYzine 25 MG capsule Commonly known as: Vistaril Take 1 capsule (25 mg total) by mouth 3 (three) times daily as needed for anxiety.   nicotine 14 mg/24hr patch Commonly known as: NICODERM CQ - dosed in mg/24 hours Place 1 patch (14 mg total) onto the skin daily.   OLANZapine zydis 5 MG disintegrating tablet Commonly known as: ZYPREXA Take 1 tablet (5 mg total) by mouth 2 (two) times daily.       Disposition: Home Diet recommendation: Cardiac diet  Discharge Exam: Vitals:   12/28/22 1905 12/28/22 2328 12/29/22 0357 12/29/22 1952  BP: (!) 113/99 105/78 123/65 118/76  Pulse: 88 98 87 81  Resp: (!) 21 (!) 23 (!) 22 (!) 21  Temp: (!) 97.5 F (36.4 C) 98.7 F (37.1 C) 97.6 F (36.4 C) 97.7 F (36.5 C)  TempSrc: Axillary Oral Oral Oral  SpO2: 94% 93% 95% 95%  Weight:      Height:       General: Appear in no distress; no visible Abnormal Neck Mass Or lumps, Conjunctiva normal Cardiovascular: S1 and S2 Present, no Murmur, Respiratory: good respiratory effort, Bilateral Air entry present and CTA, no Crackles, no wheezes Abdomen: Bowel Sound present, Non tender  Extremities: no Pedal edema Neurology: alert and oriented to time, place, and person  Filed Weights   12/26/22 0500 12/26/22 0600 12/28/22 0828  Weight: 58.9 kg 58.9 kg 57.4 kg   Condition at discharge: stable  The results of significant diagnostics from this hospitalization (including imaging, microbiology, ancillary and laboratory) are listed below for reference.   Imaging Studies: EEG  adult  Result Date: 12/27/2022 Stephen Quest, MD     12/27/2022  7:03 AM Patient Name: Stephen Shepard MRN: [id number] Epilepsy Attending: Charlsie Shepard Referring Physician/Provider: Lynnae January, NP Date: 12/27/2022 Duration: 25.38 mins Patient history:  32 y.o. male with PMH significant for polysubstance abuse, catatonic behavior, pseudoseizures, multiple hospitalizations. Admitted 5/27 after loss of consciousness, falling at work and hitting his head, bystanders stated they saw some shaking after. EEG to evaluate for seizure. Level of alertness: Awake AEDs during EEG study: Ativan Technical aspects: This EEG study was done with scalp electrodes positioned according to the 10-20 International system of electrode placement. Electrical activity was reviewed with band pass filter of 1-70Hz , sensitivity of 7 uV/mm, display speed of 39mm/sec with a 60Hz  notched filter applied as appropriate. EEG data were recorded continuously and digitally stored.  Video monitoring was available and reviewed as appropriate. Description: The posterior dominant rhythm consists of 8 Hz activity of moderate voltage (25-35 uV) seen predominantly in posterior head regions, symmetric and reactive to eye opening and eye closing. Physiologic photic driving was seen during photic stimulation.  Hyperventilation was not performed.   IMPRESSION: This study is within normal limits. No seizures or epileptiform discharges were seen throughout the recording. A  normal interictal EEG does not exclude the diagnosis of epilepsy. Stephen Shepard   ECHOCARDIOGRAM COMPLETE  Result Date: 12/25/2022    ECHOCARDIOGRAM REPORT   Patient Name:   KEIDYN FAGG Date of Exam: 12/25/2022 Medical Rec #:  [id number]    Height:       76.0 in Accession #:    [id number]   Weight:       175.0 lb Date of Birth:  1990/09/10    BSA:          2.093 m Patient Age:    31 years     BP:           123/82 mmHg Patient Gender: M            HR:           75 bpm. Exam Location:   Inpatient Procedure: 2D Echo, 3D Echo, Cardiac Doppler, Color Doppler and Strain Analysis Indications:    122-I22.9 Subsequent ST elevation (STEM) and non-ST elevation                 (NSTEMI) myocardial infarction  History:        Patient has prior history of Echocardiogram examinations, most                 recent 12/19/2021. Acute MI, Signs/Symptoms:Fever, Bacteremia,                 Altered Mental Status, Shortness of Breath, Dyspnea and Chest                 Pain; Risk Factors:Current Smoker. IVDU. Withdrawal.  Sonographer:    Sheralyn Boatman RDCS Referring Phys: 7 DAVID W [facility]  Sonographer Comments: Suboptimal parasternal window. Patient could not follow directions. IMPRESSIONS  1. Left ventricular ejection fraction, by estimation, is 50 to 55%. The left ventricle has low normal function. The left ventricle demonstrates regional wall motion abnormalities (see scoring diagram/findings for description). Left ventricular diastolic  parameters are consistent with Grade I diastolic dysfunction (impaired relaxation). There is moderate hypokinesis of the left ventricular, apical apical segment and inferolateral wall.  2. Right ventricular systolic function is hyperdynamic. The right ventricular size is normal.  3. The mitral valve is grossly normal. Trivial mitral valve regurgitation.  4. The aortic valve is tricuspid. Aortic valve regurgitation is not visualized.  5. The pulmonic valve was abnormal.  6. The inferior vena cava is normal in size with greater than 50% respiratory variability, suggesting right atrial pressure of 3 mmHg. Comparison(s): Changes from prior study are noted. 12/19/2021: LVEF 55-60%. Conclusion(s)/Recommendation(s): No evidence of valvular vegetations on this transthoracic echocardiogram. Consider a transesophageal echocardiogram to exclude infective endocarditis if clinically indicated. FINDINGS  Left Ventricle: Left ventricular ejection fraction, by estimation, is 50 to 55%. The left  ventricle has low normal function. The left ventricle demonstrates regional wall motion abnormalities. Moderate hypokinesis of the left ventricular, apical apical segment and inferolateral wall. The left ventricular internal cavity size was normal in size. There is no left ventricular hypertrophy. Left ventricular diastolic parameters are consistent with Grade I diastolic dysfunction (impaired relaxation). Indeterminate filling pressures.  LV Wall Scoring: The apical lateral segment, apical inferior segment, and apex are normal. Right Ventricle: The right ventricular size is normal. No increase in right ventricular wall thickness. Right ventricular systolic function is hyperdynamic. Left Atrium: Left atrial size was normal in size. Right Atrium: Right atrial size was normal in size. Pericardium: There is no evidence of pericardial effusion. Mitral  Valve: The mitral valve is grossly normal. Trivial mitral valve regurgitation. Tricuspid Valve: The tricuspid valve is grossly normal. Tricuspid valve regurgitation is trivial. Aortic Valve: The aortic valve is tricuspid. Aortic valve regurgitation is not visualized. Pulmonic Valve: The pulmonic valve was abnormal. Pulmonic valve regurgitation is mild. Aorta: The aortic root and ascending aorta are structurally normal, with no evidence of dilitation. Venous: The inferior vena cava is normal in size with greater than 50% respiratory variability, suggesting right atrial pressure of 3 mmHg. IAS/Shunts: There is right bowing of the interatrial septum, suggestive of elevated left atrial pressure. No atrial level shunt detected by color flow Doppler.  LEFT VENTRICLE PLAX 2D LVIDd:         5.00 cm   Diastology LVIDs:         3.50 cm   LV e' medial:    7.62 cm/s LV PW:         0.90 cm   LV E/e' medial:  7.9 LV IVS:        1.00 cm   LV e' lateral:   6.96 cm/s LVOT diam:     2.30 cm   LV E/e' lateral: 8.6 LV SV:         86 LV SV Index:   41 LVOT Area:     4.15 cm                            3D Volume EF:                          3D EF:        53 %                          LV EDV:       177 ml                          LV ESV:       82 ml                          LV SV:        95 ml RIGHT VENTRICLE             IVC RV S prime:     22.60 cm/s  IVC diam: 1.60 cm TAPSE (M-mode): 2.8 cm LEFT ATRIUM             Index        RIGHT ATRIUM           Index LA diam:        3.10 cm 1.48 cm/m   RA Area:     13.10 cm LA Vol (A2C):   47.7 ml 22.79 ml/m  RA Volume:   31.50 ml  15.05 ml/m LA Vol (A4C):   39.3 ml 18.78 ml/m LA Biplane Vol: 47.9 ml 22.89 ml/m  AORTIC VALVE             PULMONIC VALVE LVOT Vmax:   114.00 cm/s PR End Diast Vel: 1.71 msec LVOT Vmean:  74.500 cm/s LVOT VTI:    0.206 m  AORTA Ao Root diam: 3.30 cm MITRAL VALVE MV Area (PHT): 4.31 cm    SHUNTS MV Decel Time: 176 msec    Systemic VTI:  0.21 m MV E  velocity: 60.10 cm/s  Systemic Diam: 2.30 cm MV A velocity: 63.00 cm/s MV E/A ratio:  0.95 Zoila Shutter MD Electronically signed by Zoila Shutter MD Signature Date/Time: 12/25/2022/5:05:00 PM    Final    CARDIAC CATHETERIZATION  Result Date: 12/25/2022 Angiographically normal coronary arteries => Suspect Coronary Spasm Mildly reduced EF with apparent mid to apical inferior hypokinesis-poor imaging.  Recommend 2D echo. Normal LVEDP PLAN: Admit to CVICU initially with plan to transfer out in the a.m. Anticoagulate with treatment dose Lovenox for 48 hours Cycle least 2 more troponin levels Check 2D echo to evaluate wall motion-poor imaging on LV gram. Given positive troponins will load with 600 mg clopidogrel today and 75 mg daily beginning tomorrow. Start amlodipine 5 mg daily, and atorvastatin 80 mg No beta-blocker due to concern for PSA and the potential for cocaine/crack Bryan Lemma, MD   CT Angio Chest/Abd/Pel for Dissection W and/or Wo Contrast  Result Date: 12/25/2022 CLINICAL DATA:  Acute aortic syndrome suspected EXAM: CT ANGIOGRAPHY CHEST, ABDOMEN AND PELVIS TECHNIQUE:  Abdominal CT 07/28/2022.  Chest CT 12/15/2021 Multidetector CT imaging through the chest, abdomen and pelvis was performed using the standard protocol during bolus administration of intravenous contrast. Multiplanar reconstructed images and MIPs were obtained and reviewed to evaluate the vascular anatomy. RADIATION DOSE REDUCTION: This exam was performed according to the departmental dose-optimization program which includes automated exposure control, adjustment of the mA and/or kV according to patient size and/or use of iterative reconstruction technique. CONTRAST:  OMNIPAQUE IOHEXOL 350 MG/ML SOLN COMPARISON:  None Available. FINDINGS: CTA CHEST FINDINGS Cardiovascular: Preferential opacification of the thoracic aorta. No evidence of thoracic aortic aneurysm or dissection. Normal heart size. No pericardial effusion. Mediastinum/Nodes: No hematoma or adenopathy Lungs/Pleura: There is no edema, consolidation, effusion, or pneumothorax. Musculoskeletal: No acute finding or cause for chest pain Review of the MIP images confirms the above findings. CTA ABDOMEN AND PELVIS FINDINGS VASCULAR Aorta: Negative for dissection or aneurysm.  No stenosis. Celiac: Large branches with some tortuosity accentuated for age. No stenosis, aneurysm, or beading. SMA: Replaced right hepatic artery. Prominent size arteries for age with some tortuosity. No stenosis, beading, or aneurysm Renals: Smoothly contoured and widely patent. Early branching right renal artery. IMA: Smoothly contoured and patent Inflow: Unremarkable Veins: Unremarkable in the arterial phase Review of the MIP images confirms the above findings. NON-VASCULAR Hepatobiliary: No focal liver abnormality.No evidence of biliary obstruction or stone. Pancreas: Unremarkable. Spleen: Unremarkable. Adrenals/Urinary Tract: Negative adrenals. No hydronephrosis or stone. Unremarkable bladder. Stomach/Bowel:  No obstruction. No appendicitis. Vascular/Lymphatic: No acute  vascular abnormality. No mass or adenopathy. Reproductive:No pathologic findings. Other: No ascites or pneumoperitoneum. Musculoskeletal: No acute abnormalities. Review of the MIP images confirms the above findings. IMPRESSION: Negative for acute aortic syndrome.  No specific cause for symptoms. Electronically Signed   By: Tiburcio Pea M.D.   On: 12/25/2022 06:47   CT Head Wo Contrast  Result Date: 12/25/2022 CLINICAL DATA:  Mental status change with unknown cause EXAM: CT HEAD WITHOUT CONTRAST TECHNIQUE: Contiguous axial images were obtained from the base of the skull through the vertex without intravenous contrast. RADIATION DOSE REDUCTION: This exam was performed according to the departmental dose-optimization program which includes automated exposure control, adjustment of the mA and/or kV according to patient size and/or use of iterative reconstruction technique. COMPARISON:  Four days ago FINDINGS: Brain: No evidence of acute infarction, hemorrhage, hydrocephalus, extra-axial collection or mass lesion/mass effect. Vascular: No hyperdense vessel or unexpected calcification. Skull: Normal. Negative for fracture  or focal lesion. Sinuses/Orbits: No acute finding. Other: Band of streak artifact across the ventral brain. IMPRESSION: Negative motion degraded head CT. Electronically Signed   By: Tiburcio Pea M.D.   On: 12/25/2022 04:10   DG Chest 1 View  Result Date: 12/25/2022 CLINICAL DATA:  Hiccups and chest pain EXAM: CHEST  1 VIEW COMPARISON:  08/05/2022 FINDINGS: The heart size and mediastinal contours are within normal limits. Both lungs are clear. The visualized skeletal structures are unremarkable. IMPRESSION: No active disease. Electronically Signed   By: Deatra Robinson M.D.   On: 12/25/2022 03:41   CT Thoracic Spine Wo Contrast  Result Date: 12/22/2022 CLINICAL DATA:  Mid back pain EXAM: CT THORACIC AND LUMBAR SPINE WITHOUT CONTRAST TECHNIQUE: Multidetector CT imaging of the thoracic and  lumbar spine was performed without contrast. Multiplanar CT image reconstructions were also generated. RADIATION DOSE REDUCTION: This exam was performed according to the departmental dose-optimization program which includes automated exposure control, adjustment of the mA and/or kV according to patient size and/or use of iterative reconstruction technique. COMPARISON:  None Available. FINDINGS: CT THORACIC SPINE FINDINGS Alignment: Normal. Vertebrae: No acute fracture or focal pathologic process. Paraspinal and other soft tissues: Negative. Disc levels: No spinal canal stenosis CT LUMBAR SPINE FINDINGS Segmentation: 5 lumbar type vertebrae. Alignment: Normal. Vertebrae: No acute fracture or focal pathologic process. Paraspinal and other soft tissues: Negative. Disc levels: No spinal canal stenosis. IMPRESSION: Normal thoracic and lumbar spine. Electronically Signed   By: Deatra Robinson M.D.   On: 12/22/2022 02:44   CT Lumbar Spine Wo Contrast  Result Date: 12/22/2022 CLINICAL DATA:  Mid back pain EXAM: CT THORACIC AND LUMBAR SPINE WITHOUT CONTRAST TECHNIQUE: Multidetector CT imaging of the thoracic and lumbar spine was performed without contrast. Multiplanar CT image reconstructions were also generated. RADIATION DOSE REDUCTION: This exam was performed according to the departmental dose-optimization program which includes automated exposure control, adjustment of the mA and/or kV according to patient size and/or use of iterative reconstruction technique. COMPARISON:  None Available. FINDINGS: CT THORACIC SPINE FINDINGS Alignment: Normal. Vertebrae: No acute fracture or focal pathologic process. Paraspinal and other soft tissues: Negative. Disc levels: No spinal canal stenosis CT LUMBAR SPINE FINDINGS Segmentation: 5 lumbar type vertebrae. Alignment: Normal. Vertebrae: No acute fracture or focal pathologic process. Paraspinal and other soft tissues: Negative. Disc levels: No spinal canal stenosis. IMPRESSION:  Normal thoracic and lumbar spine. Electronically Signed   By: Deatra Robinson M.D.   On: 12/22/2022 02:44   CT Head Wo Contrast  Result Date: 12/21/2022 CLINICAL DATA:  Seizure and fall. Bite marks noted to upper lip and laceration to right side of face EXAM: CT HEAD WITHOUT CONTRAST CT MAXILLOFACIAL WITHOUT CONTRAST TECHNIQUE: Multidetector CT imaging of the head and maxillofacial structures were performed using the standard protocol without intravenous contrast. Multiplanar CT image reconstructions of the maxillofacial structures were also generated. RADIATION DOSE REDUCTION: This exam was performed according to the departmental dose-optimization program which includes automated exposure control, adjustment of the mA and/or kV according to patient size and/or use of iterative reconstruction technique. COMPARISON:  CT head 07/28/2022 FINDINGS: CT HEAD FINDINGS Brain: No evidence of acute infarction, hemorrhage, hydrocephalus, extra-axial collection or mass lesion/mass effect. Vascular: No hyperdense vessel or unexpected calcification. Skull: Normal. Negative for fracture or focal lesion. Other: None. CT MAXILLOFACIAL FINDINGS Osseous: No fracture or mandibular dislocation. No destructive process. Dental caries and multiple maxillary teeth. Orbits: Negative. No traumatic or inflammatory finding. Sinuses: Clear. Soft tissues: Negative. IMPRESSION: 1.  No acute intracranial pathology. 2. No acute facial fracture. 3. Dental caries and multiple maxillary teeth. Electronically Signed   By: Minerva Fester M.D.   On: 12/21/2022 23:05   CT Maxillofacial Wo Contrast  Result Date: 12/21/2022 CLINICAL DATA:  Seizure and fall. Bite marks noted to upper lip and laceration to right side of face EXAM: CT HEAD WITHOUT CONTRAST CT MAXILLOFACIAL WITHOUT CONTRAST TECHNIQUE: Multidetector CT imaging of the head and maxillofacial structures were performed using the standard protocol without intravenous contrast. Multiplanar CT  image reconstructions of the maxillofacial structures were also generated. RADIATION DOSE REDUCTION: This exam was performed according to the departmental dose-optimization program which includes automated exposure control, adjustment of the mA and/or kV according to patient size and/or use of iterative reconstruction technique. COMPARISON:  CT head 07/28/2022 FINDINGS: CT HEAD FINDINGS Brain: No evidence of acute infarction, hemorrhage, hydrocephalus, extra-axial collection or mass lesion/mass effect. Vascular: No hyperdense vessel or unexpected calcification. Skull: Normal. Negative for fracture or focal lesion. Other: None. CT MAXILLOFACIAL FINDINGS Osseous: No fracture or mandibular dislocation. No destructive process. Dental caries and multiple maxillary teeth. Orbits: Negative. No traumatic or inflammatory finding. Sinuses: Clear. Soft tissues: Negative. IMPRESSION: 1. No acute intracranial pathology. 2. No acute facial fracture. 3. Dental caries and multiple maxillary teeth. Electronically Signed   By: Minerva Fester M.D.   On: 12/21/2022 23:05    Microbiology: Results for orders placed or performed during the hospital encounter of 08/05/22  Resp panel by RT-PCR (RSV, Flu A&B, Covid) Anterior Nasal Swab     Status: None   Collection Time: 08/05/22  1:20 PM   Specimen: Anterior Nasal Swab  Result Value Ref Range Status   SARS Coronavirus 2 by RT PCR NEGATIVE NEGATIVE Final    Comment: (NOTE) SARS-CoV-2 target nucleic acids are NOT DETECTED.  The SARS-CoV-2 RNA is generally detectable in upper respiratory specimens during the acute phase of infection. The lowest concentration of SARS-CoV-2 viral copies this assay can detect is 138 copies/mL. A negative result does not preclude SARS-Cov-2 infection and should not be used as the sole basis for treatment or other patient management decisions. A negative result may occur with  improper specimen collection/handling, submission of specimen  other than nasopharyngeal swab, presence of viral mutation(s) within the areas targeted by this assay, and inadequate number of viral copies(<138 copies/mL). A negative result must be combined with clinical observations, patient history, and epidemiological information. The expected result is Negative.  Fact Sheet for Patients:  BloggerCourse.com  Fact Sheet for Healthcare Providers:  SeriousBroker.it  This test is no t yet approved or cleared by the [place] FDA and  has been authorized for detection and/or diagnosis of SARS-CoV-2 by FDA under an Emergency Use Authorization (EUA). This EUA will remain  in effect (meaning this test can be used) for the duration of the COVID-19 declaration under Section 564(b)(1) of the Act, 21 U.S.C.section 360bbb-3(b)(1), unless the authorization is terminated  or revoked sooner.       Influenza A by PCR NEGATIVE NEGATIVE Final   Influenza B by PCR NEGATIVE NEGATIVE Final    Comment: (NOTE) The Xpert Xpress SARS-CoV-2/FLU/RSV plus assay is intended as an aid in the diagnosis of influenza from Nasopharyngeal swab specimens and should not be used as a sole basis for treatment. Nasal washings and aspirates are unacceptable for Xpert Xpress SARS-CoV-2/FLU/RSV testing.  Fact Sheet for Patients: BloggerCourse.com  Fact Sheet for Healthcare Providers: SeriousBroker.it  This test is not yet approved or cleared  by the [place] and has been authorized for detection and/or diagnosis of SARS-CoV-2 by FDA under an Emergency Use Authorization (EUA). This EUA will remain in effect (meaning this test can be used) for the duration of the COVID-19 declaration under Section 564(b)(1) of the Act, 21 U.S.C. section 360bbb-3(b)(1), unless the authorization is terminated or revoked.     Resp Syncytial Virus by PCR NEGATIVE NEGATIVE Final     Comment: (NOTE) Fact Sheet for Patients: BloggerCourse.com  Fact Sheet for Healthcare Providers: SeriousBroker.it  This test is not yet approved or cleared by the [place] FDA and has been authorized for detection and/or diagnosis of SARS-CoV-2 by FDA under an Emergency Use Authorization (EUA). This EUA will remain in effect (meaning this test can be used) for the duration of the COVID-19 declaration under Section 564(b)(1) of the Act, 21 U.S.C. section 360bbb-3(b)(1), unless the authorization is terminated or revoked.  Performed at [facility], [address] [place], [place] [address]    Labs: CBC: Recent Labs  Lab 12/29/22 0928  WBC 14.7*  HGB 17.2*  HCT 49.6  MCV 85.8  PLT 231   Basic Metabolic Panel: Recent Labs  Lab 12/29/22 1117  NA 136  K 3.6  CL 102  CO2 24  GLUCOSE 115*  BUN 18  CREATININE 1.08  CALCIUM 9.1  MG 2.1   Liver Function Tests: Recent Labs  Lab 12/29/22 1117  AST 30  ALT 31  ALKPHOS 62  BILITOT 1.0  PROT 7.1  ALBUMIN 3.7   CBG: No results for input(s): "GLUCAP" in the last 168 hours.  Discharge time spent: greater than 30 minutes.  Signed: Lynden Oxford, MD [facility] 12/30/2022

## 2024-03-15 NOTE — H&P (Signed)
 H&P addendum/correction. This is a 33 year old male with polysubstance abuse including marijuana and cocaine.  Stephen Shepard 03/15/2024, 1:56 AM
